# Patient Record
Sex: Female | Born: 1960 | Race: Black or African American | Hispanic: No | Marital: Single | Smoking: Current every day smoker
Health system: Southern US, Community
[De-identification: ages and names within clinical notes are randomized; demographics above are authoritative.]

## PROBLEM LIST (undated history)

## (undated) DIAGNOSIS — I499 Cardiac arrhythmia, unspecified: Secondary | ICD-10-CM

## (undated) DIAGNOSIS — K819 Cholecystitis, unspecified: Secondary | ICD-10-CM

## (undated) DIAGNOSIS — I4891 Unspecified atrial fibrillation: Secondary | ICD-10-CM

## (undated) DIAGNOSIS — Z5189 Encounter for other specified aftercare: Secondary | ICD-10-CM

## (undated) DIAGNOSIS — I10 Essential (primary) hypertension: Secondary | ICD-10-CM

## (undated) DIAGNOSIS — M1990 Unspecified osteoarthritis, unspecified site: Secondary | ICD-10-CM

## (undated) HISTORY — PX: ANKLE SURGERY: SHX546

## (undated) HISTORY — PX: OTHER SURGICAL HISTORY: SHX169

## (undated) HISTORY — PX: BACK SURGERY: SHX140
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
## 1998-03-08 ENCOUNTER — Encounter: Admission: RE | Admit: 1998-03-08 | Discharge: 1998-06-06 | Payer: Self-pay | Admitting: Orthopedic Surgery

## 1998-07-24 ENCOUNTER — Emergency Department (HOSPITAL_COMMUNITY): Admission: EM | Admit: 1998-07-24 | Discharge: 1998-07-25 | Payer: Self-pay | Admitting: Emergency Medicine

## 1999-01-17 ENCOUNTER — Emergency Department (HOSPITAL_COMMUNITY): Admission: EM | Admit: 1999-01-17 | Discharge: 1999-01-17 | Payer: Self-pay | Admitting: Emergency Medicine

## 1999-04-08 ENCOUNTER — Emergency Department (HOSPITAL_COMMUNITY): Admission: EM | Admit: 1999-04-08 | Discharge: 1999-04-08 | Payer: Self-pay | Admitting: *Deleted

## 1999-09-24 ENCOUNTER — Emergency Department (HOSPITAL_COMMUNITY): Admission: EM | Admit: 1999-09-24 | Discharge: 1999-09-24 | Payer: Self-pay | Admitting: Emergency Medicine

## 1999-09-24 ENCOUNTER — Encounter: Payer: Self-pay | Admitting: Emergency Medicine

## 2002-01-09 ENCOUNTER — Inpatient Hospital Stay (HOSPITAL_COMMUNITY): Admission: EM | Admit: 2002-01-09 | Discharge: 2002-01-11 | Payer: Self-pay | Admitting: Emergency Medicine

## 2002-01-09 ENCOUNTER — Encounter: Payer: Self-pay | Admitting: Emergency Medicine

## 2004-11-04 ENCOUNTER — Inpatient Hospital Stay (HOSPITAL_COMMUNITY): Admission: EM | Admit: 2004-11-04 | Discharge: 2004-11-07 | Payer: Self-pay | Admitting: Emergency Medicine

## 2006-10-03 ENCOUNTER — Emergency Department (HOSPITAL_COMMUNITY): Admission: EM | Admit: 2006-10-03 | Discharge: 2006-10-04 | Payer: Self-pay | Admitting: Emergency Medicine

## 2007-09-15 ENCOUNTER — Inpatient Hospital Stay (HOSPITAL_COMMUNITY): Admission: AD | Admit: 2007-09-15 | Discharge: 2007-09-16 | Payer: Self-pay | Admitting: Obstetrics and Gynecology

## 2008-03-24 ENCOUNTER — Inpatient Hospital Stay (HOSPITAL_COMMUNITY): Admission: AD | Admit: 2008-03-24 | Discharge: 2008-03-25 | Payer: Self-pay | Admitting: Obstetrics & Gynecology

## 2008-03-26 ENCOUNTER — Inpatient Hospital Stay (HOSPITAL_COMMUNITY): Admission: RE | Admit: 2008-03-26 | Discharge: 2008-03-26 | Payer: Self-pay | Admitting: Obstetrics & Gynecology

## 2008-06-16 ENCOUNTER — Observation Stay (HOSPITAL_COMMUNITY): Admission: AD | Admit: 2008-06-16 | Discharge: 2008-06-17 | Payer: Self-pay | Admitting: Obstetrics & Gynecology

## 2008-06-16 ENCOUNTER — Ambulatory Visit: Payer: Self-pay | Admitting: Obstetrics & Gynecology

## 2008-07-02 ENCOUNTER — Encounter: Payer: Self-pay | Admitting: Obstetrics & Gynecology

## 2008-07-02 ENCOUNTER — Ambulatory Visit: Payer: Self-pay | Admitting: Obstetrics & Gynecology

## 2008-08-31 ENCOUNTER — Inpatient Hospital Stay (HOSPITAL_COMMUNITY): Admission: AD | Admit: 2008-08-31 | Discharge: 2008-08-31 | Payer: Self-pay | Admitting: Obstetrics & Gynecology

## 2008-10-06 ENCOUNTER — Inpatient Hospital Stay (HOSPITAL_COMMUNITY): Admission: AD | Admit: 2008-10-06 | Discharge: 2008-10-06 | Payer: Self-pay | Admitting: Obstetrics & Gynecology

## 2008-10-22 ENCOUNTER — Ambulatory Visit: Payer: Self-pay | Admitting: Obstetrics and Gynecology

## 2008-10-22 ENCOUNTER — Other Ambulatory Visit: Admission: RE | Admit: 2008-10-22 | Discharge: 2008-10-22 | Payer: Self-pay | Admitting: Obstetrics and Gynecology

## 2008-10-22 LAB — CONVERTED CEMR LAB
HCT: 33.9 % — ABNORMAL LOW (ref 36.0–46.0)
Hemoglobin: 9.9 g/dL — ABNORMAL LOW (ref 12.0–15.0)
MCV: 65.4 fL — ABNORMAL LOW (ref 78.0–100.0)
RBC: 5.18 M/uL — ABNORMAL HIGH (ref 3.87–5.11)
WBC: 8.3 10*3/uL (ref 4.0–10.5)

## 2008-11-18 ENCOUNTER — Ambulatory Visit: Payer: Self-pay | Admitting: Family Medicine

## 2008-11-18 ENCOUNTER — Ambulatory Visit: Payer: Self-pay | Admitting: Obstetrics and Gynecology

## 2008-12-13 ENCOUNTER — Ambulatory Visit: Payer: Self-pay | Admitting: Obstetrics & Gynecology

## 2008-12-13 ENCOUNTER — Ambulatory Visit (HOSPITAL_COMMUNITY): Admission: RE | Admit: 2008-12-13 | Discharge: 2008-12-14 | Payer: Self-pay | Admitting: Obstetrics & Gynecology

## 2009-12-06 ENCOUNTER — Emergency Department (HOSPITAL_COMMUNITY): Admission: EM | Admit: 2009-12-06 | Discharge: 2009-12-06 | Payer: Self-pay | Admitting: Emergency Medicine

## 2010-01-06 IMAGING — US US TRANSVAGINAL NON-OB
1 series · 13 of 25 positions shown · non-contrast
Comparison: 03/26/2008

CLINICAL DATA: Lower abdominal and pelvic pain.  Complex bilateral
ovarian cysts.  Fibroids.

TRANSVAGINAL ULTRASOUND OF PELVIS
TECHNIQUE: Transvaginal ultrasound examination of the pelvis was
performed including evaluation of the uterus, ovaries, adnexal
regions, and pelvic cul-de-sac.

[Series 1: us transvaginal non-ob · 0.14mm/px · 13 of 41 slices shown]
[im 1/41]
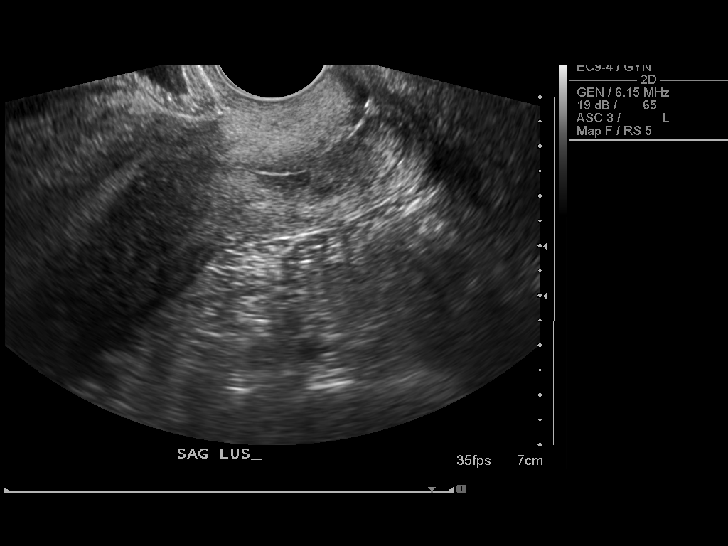
[im 4/41]
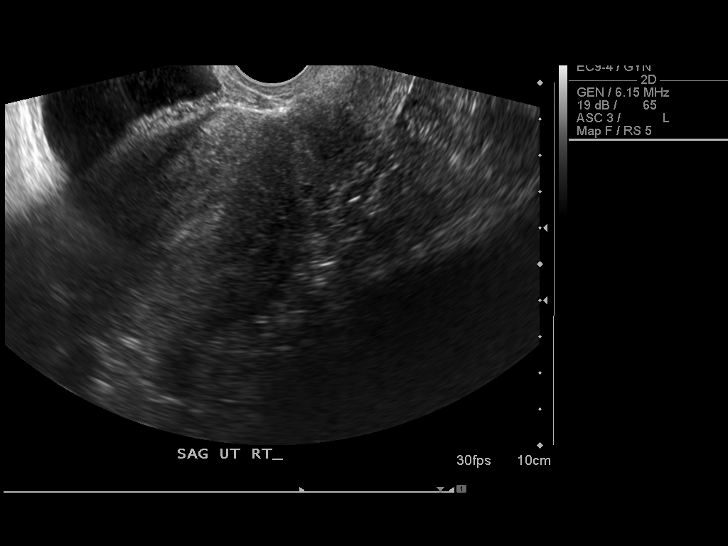
[im 7/41]
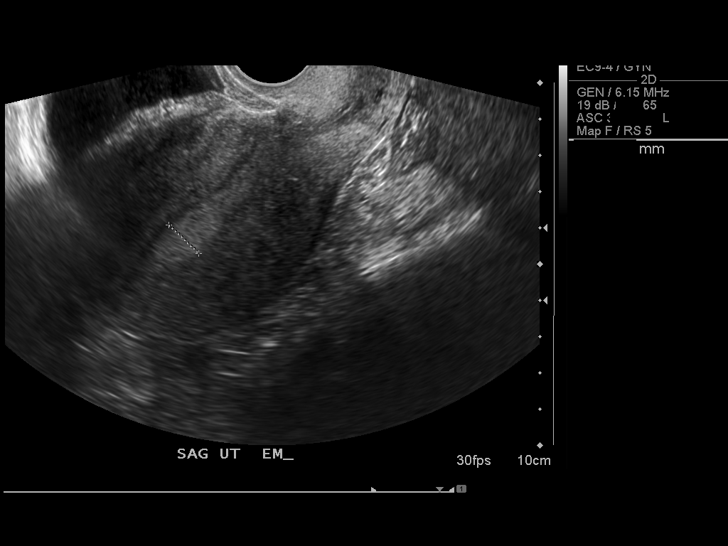
[im 11/41]
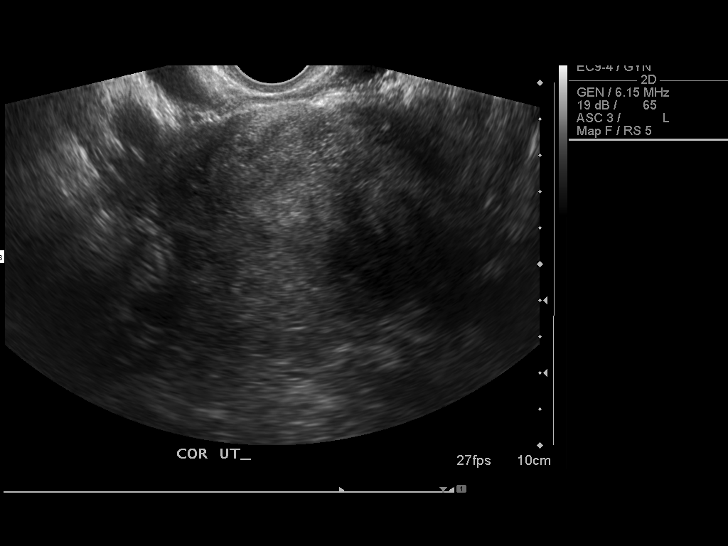
[im 14/41]
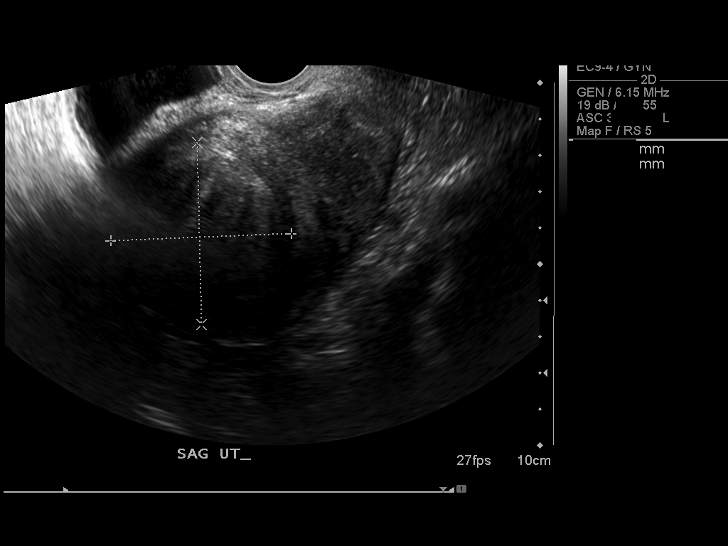
[im 17/41]
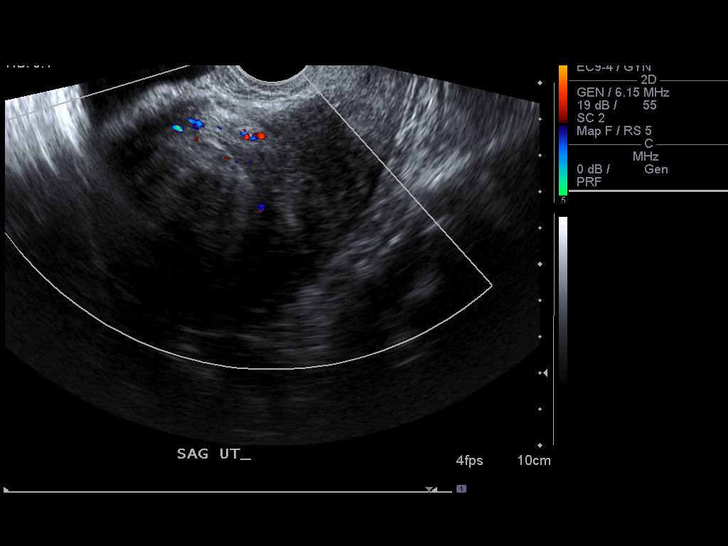
[im 21/41]
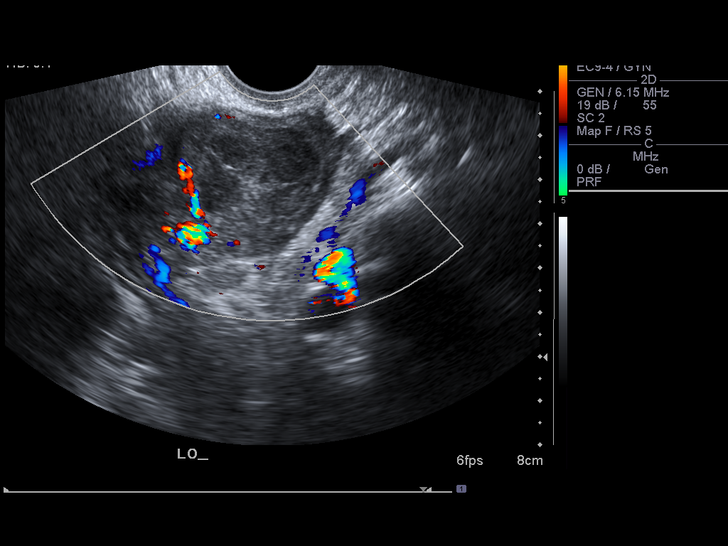
[im 24/41]
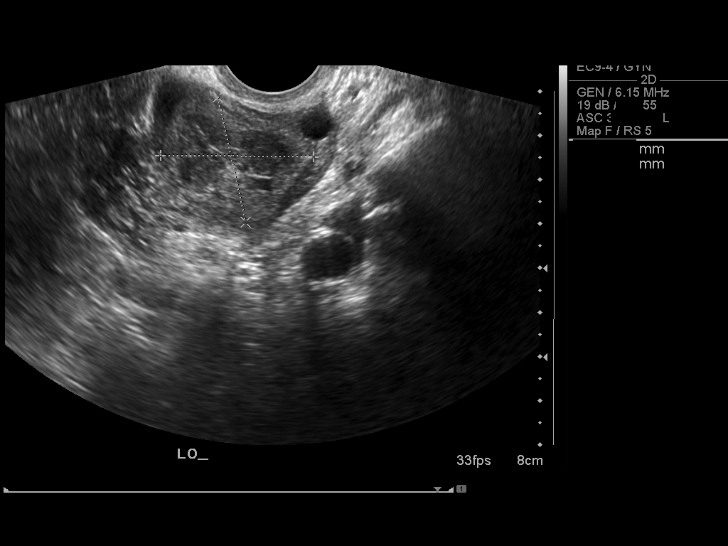
[im 27/41]
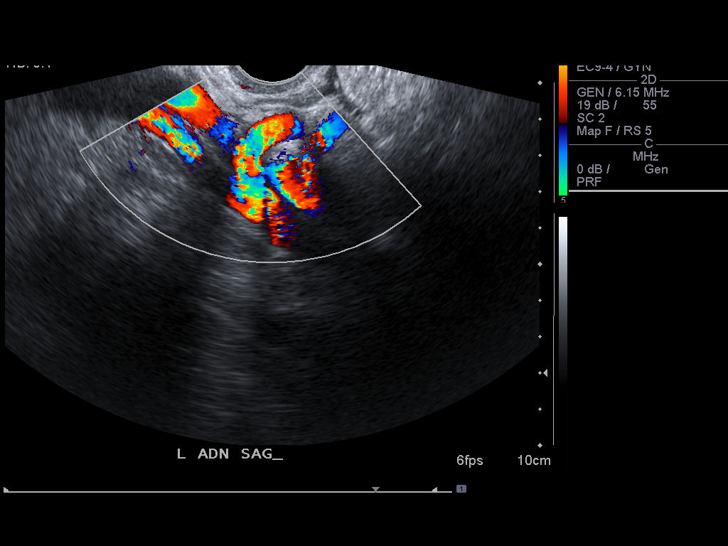
[im 31/41]
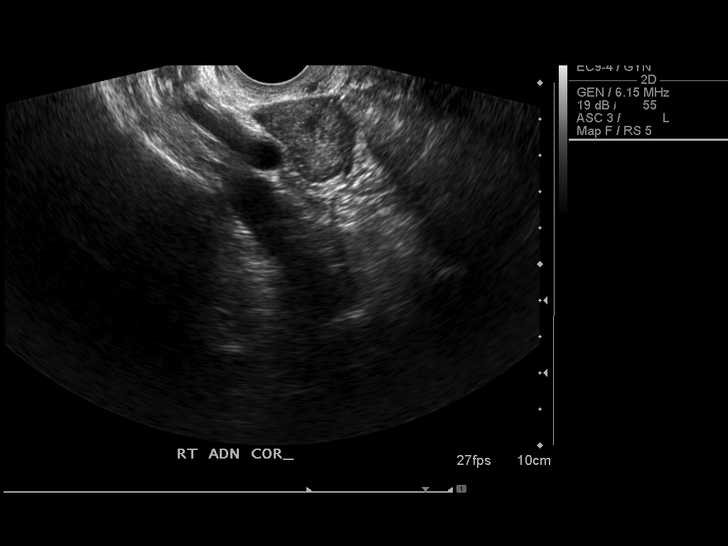
[im 34/41]
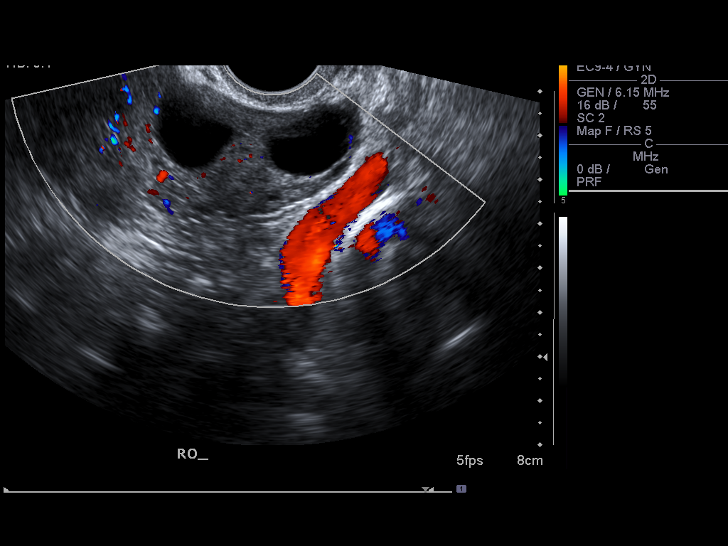
[im 37/41]
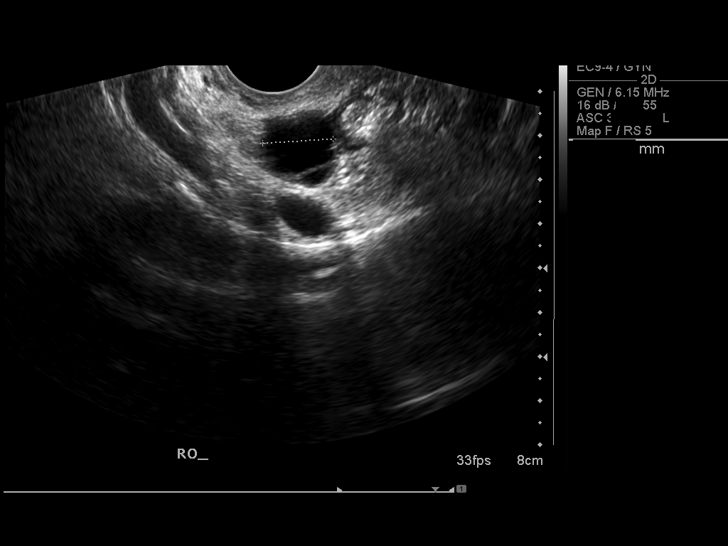
[im 41/41]
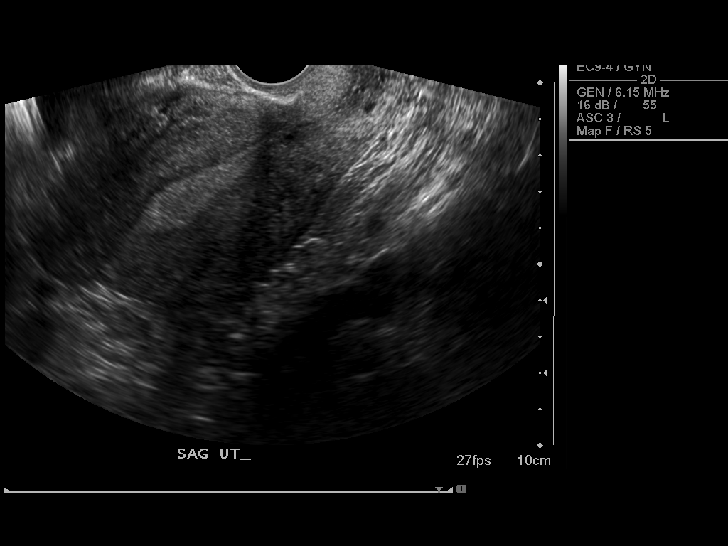

[13 of 25 positions shown; findings below may reference images not displayed]

FINDINGS: The uterus measures approximately 10.7 x 5.3 x 7.6 cm.
An intramural fibroid with a partial submucosal component is again
seen in the left uterine body which measures 5.0 cm in maximum
diameter.  This is stable since prior study.  Endometrial thickness
measures 11 mm on transvaginal sonography and is unremarkable in
appearance.

The previously seen complex left ovarian cyst is smaller in size,
now measuring 3.4 x 2.9 by 2.8 cm compared with 4.6 x 3.4 x 3.5 cm
on prior study.  This is consistent with a resolving hemorrhagic
cyst.  The right ovary is normal in appearance on today's study,
and previously seen complex right ovarian cyst is no longer
visualized.  There is no evidence of free fluid.
IMPRESSION: 1.  Stable 5 cm fibroid in the left uterine body, with partial
submucosal component.
2.  Normal right ovary, with resolution of previously seen
hemorrhagic cyst.
3.  Decreased size of left ovarian complex cyst, also consistent
with a resolving hemorrhagic cyst.

## 2010-09-17 ENCOUNTER — Encounter: Payer: Self-pay | Admitting: *Deleted

## 2010-09-18 ENCOUNTER — Encounter: Payer: Self-pay | Admitting: *Deleted

## 2010-10-01 ENCOUNTER — Emergency Department (HOSPITAL_COMMUNITY): Payer: Medicaid Other

## 2010-10-01 ENCOUNTER — Inpatient Hospital Stay (HOSPITAL_COMMUNITY)
Admission: EM | Admit: 2010-10-01 | Discharge: 2010-10-05 | DRG: 552 | Disposition: A | Discharge status: Home / Self Care | Payer: Medicaid Other | Attending: Internal Medicine | Admitting: Internal Medicine

## 2010-10-01 ENCOUNTER — Encounter (HOSPITAL_COMMUNITY): Payer: Self-pay | Admitting: Radiology

## 2010-10-01 ENCOUNTER — Inpatient Hospital Stay (HOSPITAL_COMMUNITY): Payer: Medicaid Other

## 2010-10-01 DIAGNOSIS — E669 Obesity, unspecified: Secondary | ICD-10-CM | POA: Diagnosis present

## 2010-10-01 DIAGNOSIS — M48061 Spinal stenosis, lumbar region without neurogenic claudication: Principal | ICD-10-CM | POA: Diagnosis present

## 2010-10-01 DIAGNOSIS — R29898 Other symptoms and signs involving the musculoskeletal system: Secondary | ICD-10-CM | POA: Diagnosis present

## 2010-10-01 DIAGNOSIS — D649 Anemia, unspecified: Secondary | ICD-10-CM | POA: Diagnosis present

## 2010-10-01 DIAGNOSIS — D259 Leiomyoma of uterus, unspecified: Secondary | ICD-10-CM | POA: Diagnosis present

## 2010-10-01 DIAGNOSIS — M169 Osteoarthritis of hip, unspecified: Secondary | ICD-10-CM | POA: Diagnosis present

## 2010-10-01 DIAGNOSIS — F17200 Nicotine dependence, unspecified, uncomplicated: Secondary | ICD-10-CM | POA: Diagnosis present

## 2010-10-01 DIAGNOSIS — G8190 Hemiplegia, unspecified affecting unspecified side: Secondary | ICD-10-CM | POA: Diagnosis present

## 2010-10-01 DIAGNOSIS — M1610 Unilateral primary osteoarthritis, unspecified hip: Secondary | ICD-10-CM | POA: Diagnosis present

## 2010-10-01 DIAGNOSIS — M79609 Pain in unspecified limb: Secondary | ICD-10-CM | POA: Diagnosis present

## 2010-10-01 DIAGNOSIS — M129 Arthropathy, unspecified: Secondary | ICD-10-CM | POA: Diagnosis present

## 2010-10-01 DIAGNOSIS — N83209 Unspecified ovarian cyst, unspecified side: Secondary | ICD-10-CM | POA: Diagnosis present

## 2010-10-01 DIAGNOSIS — R269 Unspecified abnormalities of gait and mobility: Secondary | ICD-10-CM | POA: Diagnosis present

## 2010-10-01 DIAGNOSIS — G8929 Other chronic pain: Secondary | ICD-10-CM | POA: Diagnosis present

## 2010-10-01 DIAGNOSIS — I10 Essential (primary) hypertension: Secondary | ICD-10-CM | POA: Diagnosis present

## 2010-10-01 LAB — DIFFERENTIAL
Eosinophils Relative: 1 % (ref 0–5)
Lymphocytes Relative: 31 % (ref 12–46)
Lymphs Abs: 3.3 10*3/uL (ref 0.7–4.0)
Monocytes Relative: 13 % — ABNORMAL HIGH (ref 3–12)
Neutrophils Relative %: 54 % (ref 43–77)

## 2010-10-01 LAB — URINALYSIS, ROUTINE W REFLEX MICROSCOPIC
Ketones, ur: NEGATIVE mg/dL
Urine Glucose, Fasting: NEGATIVE mg/dL
pH: 6 (ref 5.0–8.0)

## 2010-10-01 LAB — POCT I-STAT, CHEM 8
BUN: <3 mg/dL — ABNORMAL LOW (ref 6–23)
Chloride: 103 mEq/L (ref 96–112)
Creatinine, Ser: 0.9 mg/dL (ref 0.4–1.2)
Sodium: 136 mEq/L (ref 135–145)
TCO2: 24 mmol/L (ref 0–100)

## 2010-10-01 LAB — COMPREHENSIVE METABOLIC PANEL
ALT: 22 U/L (ref 0–35)
AST: 30 U/L (ref 0–37)
Albumin: 3.7 g/dL (ref 3.5–5.2)
Calcium: 9.6 mg/dL (ref 8.4–10.5)
GFR calc Af Amer: >60 mL/min (ref >60)
Glucose, Bld: 75 mg/dL (ref 70–99)
Potassium: 3.5 mEq/L (ref 3.5–5.1)
Sodium: 137 mEq/L (ref 135–145)
Total Protein: 8.1 g/dL (ref 6.0–8.3)

## 2010-10-01 LAB — CBC
HCT: 38.6 % (ref 36.0–46.0)
Hemoglobin: 12.6 g/dL (ref 12.0–15.0)
MCH: 22.5 pg — ABNORMAL LOW (ref 26.0–34.0)
MCHC: 32.6 g/dL (ref 30.0–36.0)
MCV: 68.9 fL — ABNORMAL LOW (ref 78.0–100.0)
RBC: 5.6 MIL/uL — ABNORMAL HIGH (ref 3.87–5.11)

## 2010-10-02 LAB — CBC
Hemoglobin: 12.4 g/dL (ref 12.0–15.0)
MCH: 22.2 pg — ABNORMAL LOW (ref 26.0–34.0)
MCV: 69.4 fL — ABNORMAL LOW (ref 78.0–100.0)
Platelets: 293 10*3/uL (ref 150–400)
RBC: 5.59 MIL/uL — ABNORMAL HIGH (ref 3.87–5.11)
WBC: 7.3 10*3/uL (ref 4.0–10.5)

## 2010-10-02 LAB — BASIC METABOLIC PANEL
BUN: 4 mg/dL — ABNORMAL LOW (ref 6–23)
CO2: 24 mEq/L (ref 19–32)
Chloride: 97 mEq/L (ref 96–112)
Creatinine, Ser: 0.63 mg/dL (ref 0.4–1.2)
GFR calc Af Amer: >60 mL/min (ref >60)
Potassium: 4.1 mEq/L (ref 3.5–5.1)

## 2010-10-02 LAB — SEDIMENTATION RATE: Sed Rate: 13 mm/hr (ref 0–22)

## 2010-10-03 LAB — CBC
HCT: 37.5 % (ref 36.0–46.0)
Hemoglobin: 12 g/dL (ref 12.0–15.0)
MCV: 70.2 fL — ABNORMAL LOW (ref 78.0–100.0)
RDW: 18.9 % — ABNORMAL HIGH (ref 11.5–15.5)
WBC: 17 10*3/uL — ABNORMAL HIGH (ref 4.0–10.5)

## 2010-10-03 LAB — BASIC METABOLIC PANEL
BUN: 9 mg/dL (ref 6–23)
CO2: 27 mEq/L (ref 19–32)
Glucose, Bld: 138 mg/dL — ABNORMAL HIGH (ref 70–99)
Potassium: 4.3 mEq/L (ref 3.5–5.1)
Sodium: 136 mEq/L (ref 135–145)

## 2010-10-04 LAB — CBC
HCT: 38.5 % (ref 36.0–46.0)
Hemoglobin: 12.1 g/dL (ref 12.0–15.0)
MCH: 22.3 pg — ABNORMAL LOW (ref 26.0–34.0)
MCHC: 31.4 g/dL (ref 30.0–36.0)
MCV: 70.9 fL — ABNORMAL LOW (ref 78.0–100.0)
RDW: 18.8 % — ABNORMAL HIGH (ref 11.5–15.5)

## 2010-10-04 LAB — BASIC METABOLIC PANEL
BUN: 11 mg/dL (ref 6–23)
CO2: 29 mEq/L (ref 19–32)
Calcium: 9.4 mg/dL (ref 8.4–10.5)
Creatinine, Ser: 0.7 mg/dL (ref 0.4–1.2)
GFR calc non Af Amer: >60 mL/min (ref >60)
Glucose, Bld: 109 mg/dL — ABNORMAL HIGH (ref 70–99)

## 2010-10-12 NOTE — Discharge Summary (Signed)
Marissa Weber, Marissa Weber                 ACCOUNT NO.:  [id number]  MEDICAL RECORD NO.:  [id number]           PATIENT TYPE:  I  LOCATION:  1309                         FACILITY:  [facility]  PHYSICIAN:  Altha Harm, MDDATE OF BIRTH:  April 21, 1961  DATE OF ADMISSION:  10/01/2010 DATE OF DISCHARGE:  10/05/2010                              DISCHARGE SUMMARY   DISCHARGE DISPOSITION:  Home.  FINAL DISCHARGE DIAGNOSES: 1. Acute right lower extremity pain and weakness. 2. Spinal stenosis. 3. Arthritis. 4. Gait abnormality. 5. Chronic pain. 6. Hypertension. 7. Complex ovarian cyst and uterine fibroids. 8. Anemia.  DISCHARGE MEDICATIONS:  Include the following: 1. Tylenol 650 mg p.o. every 4 hours p.r.n. pain. 2. Norvasc 5 mg p.o. daily. 3. Flexeril 10 mg p.o. t.i.d. 4. Gabapentin 300 mg p.o. t.i.d. 5. Methylprednisolone as follows:  60 mg p.o. daily x1, then 12 mg     p.o. daily x1, then 8 mg p.o. daily x1, then 4 mg p.o. daily x1, then stop. 6. Oxycodone SR 10 mg p.o. b.i.d. 7. Oxycodone IR 5 mg p.o. every 4 hours p.r.n. 8. Nicotine patch transdermally daily.  MEDICATIONS DISCONTINUED:  BC powders b.i.d.  CONSULTANTS: 1. Reinaldo Meeker, M.D., Neurosurgery. 2. Phone consultation with Leonides Grills, M.D., Orthopedic Surgery.  PROCEDURES:  None.  DIAGNOSTIC STUDIES: 1. CT of the head without contrast which shows moderately advanced     premature atrophy for the patient's age of 57.  Question of slowly     developing arachnoid cyst in the right lateral ventricle pons,     increased conspicuity since prior imaging studies of 2006.  No     acute intracranial findings. 2. X-ray of the right hip, which shows progressive DJD of the right     hip. 3. MRI of the brain without contrast, which shows normal examination.     No abnormality of the brain seen.  I do not believe there is a     posterior fossa arachnoid cyst. 4. MRI of the lumbar spine, which shows marked constrictions of  thecal     sac at L3-L4 and L4-L5 because of protruding disk material,     posterior ligamentous hypertrophy, and abundant epidural fat.  The     patient could suffer from symptoms of spinal stenosis because of     this.  Additionally at L3-L4, there is asymmetric protrusion of the     disk material into the right foraminal region which could focally     compress the right L3 nerve root and explain the right leg     symptoms.  At L4-L5, disk protrusion is more pronounced towards the     left and there will be particular potential of left-sided neural     compression at this level. 5. Complex ovarian cyst on the left with a diameter of 4.2 cm.     Previous ultrasound disclosed the complex cyst of the left ovary,     which measured 3.4 x 2.8 cm most recently.  Ultrasound is suggested     to reevaluate this abnormality. 6. CT of the right hip without contrast,  which shows severe     osteoarthritis of the right hip with prominent degenerative     subcortical cyst formation in the right femoral head, right     acetabulum, and prominent spurring.  There is right hip effusion.  PRIMARY CARE PHYSICIAN:  Unassigned at this time.  GYNECOLOGIST:  Dr. Argentina Donovan  CODE STATUS:  Full code.  ALLERGIES:  No known drug allergies.  CHIEF COMPLAINT:  Difficulty moving leg.  HISTORY OF PRESENT ILLNESS:  Please refer to the H and P by Dr. Pearson Grippe for the details of the HPI.  However, in short, this is a 50 year old female who presented to the emergency room with complaints of difficulty moving her right lower extremity.  HOSPITAL COURSE: 1. Right lower extremity weakness, hemiparesis.  The patient was     evaluated for the right lower extremity with both the CT of the     head and MRI of the head which revealed no evidence of CVA.  The     patient also had a lumbar MRI which showed the findings as noted     above.  Dr. Gerlene Fee from neurosurgery was consulted to see the     patient, and his  recommendations were for physical therapy and the     followup as an outpatient.  Dr. Gerlene Fee felt that the definitive     course of treatments will depend on how the patient progresses with     physical therapy.  The patient was seen by physical therapy here in     the hospital and is being recommended for outpatient physical     therapy to continue.  She is to follow up with Dr. Gerlene Fee in the     office as an outpatient.  The number has been provided to the     patient, [phone].  Additionally, the patient was found to have     degenerative osteoarthritis of the left hip.  Dr. Lestine Box was     consulted.  He had a phone consultation with Dr. Rito Ehrlich and he     recommends following up as an outpatient with Dr. Charlann Boxer who saw the     patient in the past.  Presently, the patient is ambulating with a     walker independently. 2. Complex ovarian cyst.  The patient was noted incidentally to have a     complex ovarian cyst on the left.  In reviewing the patient's prior     radiologic studies, she has had a complex ovarian cyst on left side     which appears to now have gotten larger.  The patient was seen by     Dr. Argentina Donovan in the GYN outpatient clinic and a GYN appointment     will be made for the patient to follow up with Dr. Okey Dupre to further     investigate this finding. 3. Hypertension.  The patient was found to be hypertensive but not on     any medications.  She has been started on Norvasc 5 mg and will     need to be followed up for further titration of medications as an     outpatient. 4. Acute pain.  The patient experienced an acute pain related to her     spinal stenosis as well as the osteoarthritis.  The patient has     been placed on Flexeril for the muscle pain.  Additionally, the     patient is on a Medrol Dosepak  which she is completing over the     next 4 days.  The patient has also been placed on long-acting     OxyContin as well as OxyIR for breakthrough pain. 5.  Gait abnormality.  Secondary to the pain, the patient has been     having problems with ambulation and has been using a cane in the     past.  The patient has now been downgraded to a walker, and she is     to continue using that.  The patient will need outpatient therapy     or in-home therapy per recommendations of physical therapy.  PHYSICAL EXAMINATION:  At the time of discharge, the patient's physical examination is as such. GENERAL:  The patient is well appearing, in no acute distress. VITAL SIGNS:  Temperature is 97.9, heart rate 78, blood pressure 157/90, respiratory rate 18, O2 saturations are 97% on room air. HEENT:  She is normocephalic, atraumatic.  Pupils equally round and reactive to light and accommodation.  Extraocular movements are intact. The oropharynx is moist.  No exudate, erythema, or lesions are noted. NECK: Trachea is midline.  No masses, no thyromegaly, no JVD, no carotid bruit. RESPIRATORY:  The patient has a normal respiratory effort, equal excursion bilaterally.  No wheezing or rhonchi noted. CARDIOVASCULAR:  She has got a normal S1 and S2.  No murmurs, rubs or gallops noted.  PMI is nondisplaced.  No heaves or thrills on palpation. ABDOMEN:  Obese, soft, nontender, nondistended.  No masses.  No hepatosplenomegaly is noted. EXTREMITIES:  No clubbing, cyanosis or edema. NEUROLOGICAL:  The patient has no focal neurological deficits. PSYCHIATRIC:  She is alert and oriented x3.  Good insight and cognition. Good recent and remote recall.  DIETARY RESTRICTIONS:  The patient should be on a heart-healthy diet.  PHYSICAL RESTRICTIONS:  The patient is to ambulate with a walker continue with physical therapy at home.  FOLLOWUP:  The patient will follow up with the GYN clinic for the ovarian cyst.  She is to follow up with Dr. Gerlene Fee.  An appointment to be arranged in Dr. Charlann Boxer, she should call for the appointment.  I will speak with the case manager about referring  the patient to [facility] for followup of her hypertension and her pain issues.  Total time for this discharge 35 minutes.     Altha Harm, MD     MAM/MEDQ  D:  10/05/2010  T:  10/05/2010  Job:  [id number]  cc:   Reinaldo Meeker, M.D. Fax: [phone]  Leonides Grills, M.D. Fax: [phone]  Madlyn Frankel. Charlann Boxer, M.D. Fax: [phone]  Dr. Argentina Donovan  Electronically Signed by Marthann Schiller MD on 10/12/2010 12:59:43 PM

## 2010-10-26 ENCOUNTER — Encounter: Payer: Self-pay | Admitting: Obstetrics and Gynecology

## 2010-11-14 NOTE — H&P (Signed)
Marissa Weber, Marissa Weber                 ACCOUNT NO.:  [id number]  MEDICAL RECORD NO.:  [id number]           PATIENT TYPE:  I  LOCATION:  1309                         FACILITY:  [facility]  PHYSICIAN:  Massie Maroon, MD        DATE OF BIRTH:  [birth year]  DATE OF ADMISSION:  10/01/2010 DATE OF DISCHARGE:                             HISTORY & PHYSICAL   CHIEF COMPLAINT:  "I am having difficulty moving my leg."  HISTORY OF PRESENT ILLNESS:  50 year old female with history of anemia, uterine fibroids, ovarian cysts, presents with complaints of difficulty moving her right lower extremity. Actually symptoms have been going on starting today.  She has had problems with back pain in the past but has difficulty moving her right lower extremity what prompted her to come to the ED today.  The patient states that there is pain extending down from her lower back down her right leg towards her foot.  She cannot recall any trauma or heavy lifting.  The patient will be evaluated for intractable pain and further evaluation with MRI.  The patient denied any focal neurological signs other than weakness in her right leg.  She denied any slurred speech, recent viral illness.  The patient will be admitted for the above diagnosis.  PAST MEDICAL HISTORY: 1. Anemia. 2. Fibroid uterus. 3. Ovarian cyst.  PAST SURGICAL HISTORY: 1. Four C sections and tubal ligation. 2. Orthopedic procedure for screws in the left ankle. 3. Endometrial ablation.  SOCIAL HISTORY:  The patient is a smoker and occasionally drinks alcohol but denies any drug use.  FAMILY HISTORY:  Notable that her mother died of complications of diabetes.  ALLERGIES:  No known drug allergies.  MEDICATIONS:  See med rec.  REVIEW OF SYSTEMS:  Negative for all 10 organ systems except for pertinent positives stated above.  PHYSICAL EXAM:  VITAL SIGNS:  Stable, see physician's  ER note for original signs. HEENT:  Anicteric, EOMI, no nystagmus,  pupils 1.5 mm, symmetric, direct consensual near reflexes intact.  Mucous membranes moist. NECK:  No JVD, no bruit, no thyromegaly, no adenopathy. HEART:  Regular rate and rhythm.  S1, S2, no murmurs, gallops, or rubs. LUNGS:  Clear to auscultation bilaterally. ABDOMEN:  Soft, nontender, nondistended.  Positive bowel sounds. EXTREMITIES:  No cyanosis, clubbing, or edema. SKIN:  No rashes.  Lymph nodes, no adenopathy. NEURO  EXAM:  There is some right lower extremity weakness, although she is able to move her right foot and right leg.  It appears to be painful. There is a positive straight leg raise.  DP pulses are 2+ bilaterally, pinprick intact. Reflexes 2+, symmetric, diffuse with downgoing toes bilaterally.  IMAGING STUDIES:  CT brain moderately advanced premature atrophy for the patient's age of 52, question slowly developing arachnoid cyst, right lateral ventral pons increasing in conspicuity since prior imaging 2006.  Hip x-ray:  Progressive DJD right hip.  LABS:  Urinalysis negative.  WBC 10.8, hemoglobin 12.6, platelet count278.  Sodium 136, potassium 3.7, BUN less than 3, creatinine 0.9, PT 13.5, INR 1.01,  PTT 38.  ESR 22.  ASSESSMENT/PLAN:  Intractable back pain with right lower extremity weakness:  Check an MRI brain without contrast along with an MRI of the lumbosacral spine.  Check an ESR as stated above, if high then consider an SPEP and EPEP.  Will try to achieve pain control as well and may need to involve physical therapy, occupational therapy once imaging is performed or orthopedic or  neurosurgery depending on the results of the MRI.  DVT prophylaxis.  SCDs.     Massie Maroon, MD     JYK/MEDQ  D:  10/04/2010  T:  10/04/2010  Job:  [id number]  Electronically Signed by Pearson Grippe MD on 11/14/2010 09:32:15 PM

## 2010-11-29 ENCOUNTER — Encounter: Payer: Self-pay | Admitting: Obstetrics and Gynecology

## 2010-12-06 LAB — CBC
Hemoglobin: 11.5 g/dL — ABNORMAL LOW (ref 12.0–15.0)
MCHC: 30.9 g/dL (ref 30.0–36.0)
MCV: 63.5 fL — ABNORMAL LOW (ref 78.0–100.0)
Platelets: 231 10*3/uL (ref 150–400)
Platelets: 318 10*3/uL (ref 150–400)
RDW: 22.6 % — ABNORMAL HIGH (ref 11.5–15.5)
WBC: 8.6 10*3/uL (ref 4.0–10.5)

## 2010-12-11 LAB — CBC
HCT: 37.1 % (ref 36.0–46.0)
Platelets: 272 10*3/uL (ref 150–400)
RBC: 4.93 MIL/uL (ref 3.87–5.11)
WBC: 10.5 10*3/uL (ref 4.0–10.5)

## 2010-12-11 LAB — SAMPLE TO BLOOD BANK

## 2010-12-12 LAB — WET PREP, GENITAL
Trich, Wet Prep: NONE SEEN
Yeast Wet Prep HPF POC: NONE SEEN

## 2010-12-12 LAB — GC/CHLAMYDIA PROBE AMP, GENITAL
Chlamydia, DNA Probe: NEGATIVE
GC Probe Amp, Genital: NEGATIVE

## 2010-12-12 LAB — DIC (DISSEMINATED INTRAVASCULAR COAGULATION)PANEL
D-Dimer, Quant: 0.32 ug/mL-FEU (ref 0.00–0.48)
Fibrinogen: 343 mg/dL (ref 204–475)
Prothrombin Time: 13.5 seconds (ref 11.6–15.2)
aPTT: 33 seconds (ref 24–37)

## 2010-12-12 LAB — CBC
Hemoglobin: 10.5 g/dL — ABNORMAL LOW (ref 12.0–15.0)
MCV: 69.4 fL — ABNORMAL LOW (ref 78.0–100.0)
RBC: 4.95 MIL/uL (ref 3.87–5.11)
WBC: 9 10*3/uL (ref 4.0–10.5)

## 2010-12-12 LAB — DIC (DISSEMINATED INTRAVASCULAR COAGULATION) PANEL
INR: 1 (ref 0.00–1.49)
Platelets: 400 10*3/uL (ref 150–400)
Smear Review: NONE SEEN

## 2010-12-27 ENCOUNTER — Other Ambulatory Visit: Payer: Self-pay | Admitting: Neurosurgery

## 2010-12-27 DIAGNOSIS — M4800 Spinal stenosis, site unspecified: Secondary | ICD-10-CM

## 2011-01-01 ENCOUNTER — Ambulatory Visit
Admission: RE | Admit: 2011-01-01 | Discharge: 2011-01-01 | Disposition: A | Discharge status: Home / Self Care | Payer: Medicaid Other | Source: Ambulatory Visit | Attending: Neurosurgery | Admitting: Neurosurgery

## 2011-01-01 DIAGNOSIS — M4800 Spinal stenosis, site unspecified: Secondary | ICD-10-CM
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
## 2011-01-09 NOTE — Op Note (Signed)
Marissa Weber, Marissa Weber                 ACCOUNT NO.:  [id number]   MEDICAL RECORD NO.:  [id number]          PATIENT TYPE:  OBV   LOCATION:  9319                          [facility]ILITY[facility]   PHYSICIAN:  Scheryl Darter, MD       DATE OF BIRTH:  [birth year]   DATE OF PROCEDURE:  12/13/2008  DATE OF DISCHARGE:                               OPERATIVE REPORT   PROCEDURE:  Endometrial ablation using ThermaChoice.   PREOPERATIVE DIAGNOSIS:  Menorrhagia with uterine fibroid.   POSTOPERATIVE DIAGNOSIS:  Menorrhagia with uterine fibroid.   SURGEON:  Scheryl Darter, MD   ANESTHESIA:  General.   ESTIMATED BLOOD LOSS:  Minimal.   SPECIMENS:  None.   DRAINS:  None.   COUNTS:  Correct.   OPERATIVE REPORT:  The patient gave written consent for endometrial  ablation.  She had menorrhagia, a benign endometrial biopsy, and an  intramural fibroid on ultrasound.  The patient identification was  confirmed and she was brought to the OR and general anesthesia was  induced.  She was placed in dorsal lithotomy position.  Exam revealed  about a 6-8-week size uterus.  No adnexal masses.  Perineum and vagina  were sterilely prepped and draped.  Bladder was drained with red rubber  catheter.  Speculum was inserted.  The cervix was grasped with single-  tooth tenaculum.  The uterus sounded to 8.5 cm.  Cervix was dilated  enough to pass the preprimed ThermaChoice device.  D5 of water was used  to show the balloon.  The balloon was filled sufficiently to maintain a  pressure in the proper range to initiate therapy.  Some more fluid was  needed to be instilled during the treatment cycle maintaining pressure  above the 110 mmHg.  Appropriate temperature was maintained throughout  the minute cycle.  The fluid was drained from the device and instrument  was removed.  All instruments were removed from the vagina.  The patient  tolerated procedure well without complications.  She was brought in  stable condition to recovery  room.      Scheryl Darter, MD  Electronically Signed     JA/MEDQ  D:  12/13/2008  T:  12/14/2008  Job:  [id number]

## 2011-01-09 NOTE — Discharge Summary (Signed)
Marissa Weber, Marissa Weber                 ACCOUNT NO.:  [id number]   MEDICAL RECORD NO.:  [id number]          PATIENT TYPE:  OBV   LOCATION:  9319                          [facility]   PHYSICIAN:  Scheryl Darter, MD       DATE OF BIRTH:  09-29-1960   DATE OF ADMISSION:  12/13/2008  DATE OF DISCHARGE:  12/14/2008                               DISCHARGE SUMMARY   DIAGNOSES:  1. Menorrhagia.  2. Postoperative pain from endometrial ablation.   The patient is a 50 year old black female gravida 4, para 4, last  menstrual period 2 weeks ago who came for an outpatient endometrial  ablation with ThermaChoice on April 19.  She has had problems with  menorrhagia and previously anemia was transfused.  She underwent  ThermaChoice endometrial ablation without complications, but  postoperatively she had pain that was not well controlled with Dilaudid  and Toradol and she was placed an overnight observation for pain  control.   PAST MEDICAL HISTORY:  1. Anemia.  2. Fibroid uterus.  3. Ovarian cyst.   PAST SURGICAL HISTORY:  1. Four cesarean sections and tubal ligation.  2. Orthopedic procedure with screws in her left ankle.   SOCIAL HISTORY:  The patient is a smoker and occasionally drinks  alcohol, but denies drug use.   No known drug allergies.   MEDICATIONS:  Iron supplement daily, Tylenol p.r.n., and Advil p.r.n.  for joint pain.   PHYSICAL EXAMINATION:  VITAL SIGNS:  Weight 168.2 pounds, height 59  inches, and pulse of 104.  CHEST:  Clear.  HEART:  Regular rate and rhythm.  PELVIC:  Per Dr. Okey Dupre shows 8-10 weeks size uterus.   Ultrasound showed an 11-mm endometrial thickness and a 5-cm fibroid in  the left uterine body.  She had a benign endometrial biopsy.  On the day  after surgery, her pain was well controlled having used Dilaudid PCA.  Hemoglobin dropped from 11.5 to 8.9, but there was no sign of any active  bleeding and her abdominal exam was benign.  She  was discharged home  to continue with her iron supplement.  She was given  prescription for Motrin 600 mg p.o. q.6 h. p.r.n. pain and Vicodin 5/500  1-2 p.o. q.4-6 h. p.r.n. pain.  She is to report if symptoms of pain,  heavy bleeding and/or discharge, or fever reported.  She is to follow up  in 4 weeks at [facility] at [facility].      Scheryl Darter, MD  Electronically Signed     JA/MEDQ  D:  12/14/2008  T:  12/14/2008  Job:  [id number]

## 2011-01-09 NOTE — Group Therapy Note (Signed)
NAMEGLENDON, FISER NO.:  [id number]   MEDICAL RECORD NO.:  [id number]          PATIENT TYPE:  WOC   LOCATION:  [facility]                   FACILITY:  [facility]   PHYSICIAN:  Scheryl Darter, MD       DATE OF BIRTH:  14-Nov-1960   DATE OF SERVICE:  11/18/2008                                  CLINIC NOTE   Patient returns today for evaluation for endometrial ablation.  The  patient is a 50 year old black female gravida 4, para 4-0-0-5, last  menstrual period started October 06, 2008.  She had a history in the  last 3 months of increasing menstrual flow.  In November 2009, she had  heavy vaginal bleeding and was admitted with a hemoglobin around 5 and  she received 4 units of packed red blood cells and she was placed on  iron.  She was found to have a fibroid measuring about 5 cm on  ultrasound which was stable from a previous exam.  The fibroid was  mostly intramural with a small submucosal component.  In February 2010,  hemoglobin was 10 with a hematocrit of 34.   PAST MEDICAL HISTORY:  1. Anemia.  2. Fibroid uterus.  3. Ovarian cyst.   PAST SURGICAL HISTORY:  1. Four cesarean sections with tubal ligation in her last delivery.  2. Orthopedic procedure with screws in her left ankle.   SOCIAL HISTORY:  The patient is an occasional drinker, current smoker  and she denies drug abuse.   No known drug allergies.   MEDICATIONS:  1. Iron supplement 1 p.o. daily.  2. Tylenol p.r.n.  3. Advil p.r.n. for joint pain.   FAMILY HISTORY:  Diabetes and hypertension in her parents.   REVIEW OF SYSTEMS:  No bleeding today, abnormal discharge or urinary  symptoms.   PHYSICAL EXAM:  The patient in no acute distress.  Weight is 168.2  pounds, height is 59 inches, pulse 104, temperature 98.8.  CHEST:  Clear.  HEART:  Regular rate and rhythm without murmurs, gallops or rubs.  ABDOMEN:  Obese.  PELVIC:  On February 26th, external genitalia normal, uterus was about 8-  10  weeks, no adnexal masses.  Dr. Okey Dupre performed an endometrial biopsy  on February 26th which showed benign secretory endometrium,  decidualization of the stroma.  Ultrasound on June 16, 2008, she has  got an 11-mm endometrial thickness and a 5 cm fibroid in the left  uterine body.   IMPRESSION:  1. Menorrhagia and history of anemia.  2. Uterine fibroids.   PLAN:  The patient is interested in having endometrial ablation.  The  procedure was explained.  The option for NovaSure or ThermaChoice was  discussed.  I will schedule a ThermaChoice ablation.  The risks of  continued problems with bleeding, anesthesia complications, infection,  uterine damage were discussed and her questions were answered.      Scheryl Darter, MD     JA/MEDQ  D:  11/18/2008  T:  11/18/2008  Job:  [id number]

## 2011-01-12 NOTE — Discharge Summary (Signed)
Marissa, Weber                 ACCOUNT NO.:  [id number]   MEDICAL RECORD NO.:  [id number]          PATIENT TYPE:  INP   LOCATION:  3019                         FACILITY:  [facility]   PHYSICIAN:  Marlan Palau, M.D.  DATE OF BIRTH:  [birth year]   DATE OF ADMISSION:  11/04/2004  DATE OF DISCHARGE:  11/06/2004                                 DISCHARGE SUMMARY   ADMISSION DIAGNOSIS:  Hemiparesis and hemi sensory deficit, possible left  brain stroke.   DISCHARGE DIAGNOSES:  1.  Probable hysterical right-sided symptoms of numbness, weakness, and      visual change.  2.  Positive urine drug screen for cocaine.   PROCEDURE:  1.  CT of the head.  2.  MRI of the brain.  3.  MR angiogram of intracranial and extracranial vessels.   COMPLICATIONS OF ABOVE PROCEDURES:  None.   HISTORY OF PRESENT ILLNESS:  Marissa Weber is a 50 year old right-handed black  female born [birth year] with a history of tobacco abuse and alcohol  abuse prior to admission.  This patient was brought into [facility]  emergency room for onset of a deficit of right-sided weakness and numbness,  and visual changes off to the right that occurred around 5 to 5:30 p.m.  The  patient got to the emergency room about 6:30 or 6:45 p.m.  CT scan of the  brain was done and was unremarkable.  The patient's examination reveals  evidence of a floppy right side but no involvement of the face, complete  anesthesia of the right face, arm and leg, and a visual field deficit off to  the right.  The patient was admitted for further evaluation.   The patient was given TPA initiated in the emergency room around 7 p.m.   PAST MEDICAL HISTORY:  1.  New onset of right hemiparesis and right hemianesthesia and visual field      deficit, rule out left brain stroke.  2.  Left ankle fracture, status post surgery.  3.  Obesity.  4.  Degenerative arthritis.  5.  History of C-section in the past.   CURRENT MEDICATIONS:  The patient  is currently on no medications prior to  admission.   SOCIAL HISTORY:  She smokes a half pack of cigarettes a day and drinks beer  on a daily basis, one to two 40 ounce beers daily.   ALLERGIES:  THE PATIENT HAS NO KNOWN ALLERGIES.   Please refer to history and physical dictation for social history, family  history, review of systems, and  physical examination.   LABORATORY DATA:  Notable for CK of 133, troponin I less than 0.1, MB  fraction 1.3.  Hemoglobin A1c 6.2.  Alcohol level 264.  Sodium 139,  potassium 3.8, chloride 108, CO2 23, glucose 105, BUN 6, creatinine 0.7,  calcium 8.6, total protein 7.0, albumin 3.3, AST 29, ALT 19, alkaline  phosphatase 69, total bilirubin 0.4.  INR 0.9.  White count 9.2, hemoglobin  9.0, hematocrit 28.8, platelets 574,000.  Urinalysis reveals a specific  gravity of 1.019, pH 5.5.  Urine white  count 0-2, urine red count 0-2.  Urine drug screen positive for cocaine and benzodiazepines.  Homocysteine  level 12.13.  Lipid profile reveals a cholesterol level of 147 with  triglycerides of 97, HDL 67, VLDL 19, and LDL 61.  An iron profile was also  ordered with iron level less than 10.   EKG reveals normal sinus rhythm, normal EKG with a heart rate of 74.  Chest  x-ray shows subtle air space disease at the bases, relatively unremarkable.   HOSPITAL COURSE:  The patient was admitted to [facility] and was  evaluated for right-sided deficits.  Shortly after administration of TPA  through the emergency room, the patient appeared to have somewhat variable  signs and symptoms on the clinical examination intermittently using the  right arm and right leg and then other times not being able to use the arm  and leg, using the right hand with compensation movements.  The visual field  deficit suddenly switched to the left side rather than the right side.  The  patient had baby talking off and on.  The patient appeared to have an  examination more  consistent with an hysterical deficit or malingering  problem.  The patient was inquiring about getting on disability through [employer]  in the emergency room.  The patient was set up for MRI scan of the brain  which was done and was unremarkable and no evidence of an acute stroke was  noted, even with persistence of symptoms.  MR angiogram, however, showed  significant intracranial disease, no significant extracranial disease.  The  patient was placed on aspirin and will be discharged today.  The patient was  asked not to continue to use cocaine although she denies the use of this  drug.  It is not clear what the source of the benzodiazepines was.  The  patient appears to have a low iron profile and we will initiate iron therapy  prior to discharge.  The patient will follow up with [facility] on an as-needed basis.  The patient will be on a regular, no  added salt diet.  It was felt, once again, that this patient has an  hysterical right-sided weakness pattern.      CKW/MEDQ  D:  11/06/2004  T:  11/06/2004  Job:  [id number]   cc:   [facility]  [address]  [place], [place]

## 2011-01-12 NOTE — H&P (Signed)
NAMECAMIRA, Weber NO.:  [id number]   MEDICAL RECORD NO.:  [id number]          PATIENT TYPE:  EMS   LOCATION:  MAJO                         FACILITY:  [facility]   PHYSICIAN:  Marlan Palau, M.D.  DATE OF BIRTH:  1961-05-10   DATE OF ADMISSION:  11/04/2004  DATE OF DISCHARGE:                                HISTORY & PHYSICAL   HISTORY OF PRESENT ILLNESS:  Marissa Weber is a 50 year old, right-handed,  black female born 1960-10-15, without any preexisting risk factors for  stroke except for tobacco abuse and possibly alcohol abuse.  This patient  has come to the [facility] Emergency Room with what she believes was onset  of deficit around 5 to 5:30 this afternoon while taking a bath.  The patient  noted some right-sided weakness, vision change off the right.  Denies any  headache.  The patient was brought in through EMS.  CT scan of the brain was  unremarkable.  Examination today does reveal evidence of a complete, right-  sided hemianesthesia.  The patient has variable motor testing, at times will  not move the right arm or right leg at all; other times actually uses the  right hand in conversational movements.  The patient has a prominent  homonymous visual field deficit.  The patient has normal speech pattern, no  aphasia noted.  The patient is brought in for further evaluation at this  point.   PAST MEDICAL HISTORY:  1.  New onset of right hemiparesis, right hemianesthesia, visual field      deficit.  Rule out left brain stroke.  2.  Left ankle fracture status post surgery.  3.  Obesity.  4.  Degenerative arthritis.  5.  History of C section.   MEDICATIONS:  The patient is on no medications.   HABITS:  Smokes one-half cigarettes a day, drinks beer daily, claims one to  2 40-ounce beers a day.   ALLERGIES:  The patient again states no known drug allergies.   SOCIAL HISTORY:  The patient is widowed, lives in the [place] area, is  not working.   Has five children who are alive and well.   FAMILY MEDICAL HISTORY:  Notable that mother died with complications of  diabetes.  The patient's father is alive and well.  The patient is an only  child.   REVIEW OF SYSTEMS:  Notable for no recent fevers or chills.  The patient  denies any headache, denies any problems with breathing, chest pain,  shortness of breath, abdominal pain.  Had some nausea and vomiting two days  ago.  Denies any problems controlling the bowels or bladder.  Has not had  any blackout episodes.   PHYSICAL EXAMINATION:  VITAL SIGNS:  Blood pressure 103/54, heart rate 86,  respiratory rate 20, temperature afebrile.  GENERAL:  This patient is a moderately obese black female who is alert and  cooperative at the time of examination, somewhat restless.  HEENT:  Head is atraumatic.  Eyes: Pupils equal, round, and reactive to  light.  Disks are flat bilaterally.  NECK:  Supple.  No carotid bruits noted.  RESPIRATORY:  Examination is clear.  CARDIOVASCULAR:  Regular rate and rhythm with no obvious murmurs or rubs  noted.  ABDOMEN:  Positive bowel sounds.  No organomegaly or tenderness noted.  EXTREMITIES:  Without significant edema.  NEUROLOGIC:  Cranial nerves as above.  Facial symmetry is present.  The  patient does not cut the eyes over to the right well at times; at other  times, seems to do better.  Does not blink to threat from either side.  The  patient notes complete loss of sensation on the right side of the face to  pinprick, soft touch sensation and does not split the midline with vibratory  sensation.  The patient has variable motor testing with the right arm at  times totally floppy, at other times able to hold the arm up.  Will not let  the arm hit her face as it drops.  At other times, she actually uses the  hand with conversational movements.  The left leg again offers no  spontaneous movement, but at other times will move it on her own.  The  patient  again has complete loss of sensation to all modalities on the right  arm and right leg as compared to the left.  The patient has what appears to  be a homonymous visual field deficit off to the right.  The patient has  symmetric reflexes.  Toes are neutral bilaterally.  The patient is able to  perform finger-nose-finger with the left upper extremity, seems to be ataxic  with the left lower extremity with heel-to-shin. Was not ambulated.  The  patient is able to pull herself up and sit upright on her own.   LABORATORY DATA AND OTHER STUDIES:  Laboratory values notable for sodium of  139, potassium 3.8, chloride 108, CO2 23, glucose 105, BUN 6, creatinine  0.7.  Calcium 8.6, total protein 7.0, albumin 3.3, AST 29, ALT 19, alkaline  phosphatase 69, total bilirubin 0.4.  The patient has an INR of 0.09.  White  count 9.2, hemoglobin 9.0, hematocrit 28.8, MCV 58.6, platelets 574.  Alcohol level is 264.   Chest x-ray and EKG are pending.   At this time, CT of the head is unremarkable.   IMPRESSION:  New onset of right-sided weakness and numbness, visual field  change.  Rule out left brain stroke.   This patient really has minimal risk factors for stroke events.  The patient  is normotensive.  The patient's examination is somewhat unusual in that  motor testing is somewhat variable, but I cannot say definitely that the  examination is hysterical or evidence of manipulation.  For this reason, I  suspect this patient may be a candidate for tPA.  Will initiate this  treatment at this point and admit this patient for further evaluation.   PLAN:  1.  IV tPA now.  2.  Admission to intensive care unit.  3.  MRI of the brain.  4.  MR angiogram of intracranial and extracranial vessels.  5.  A 2-D echocardiogram.  6.  Urine drug screen, iron panel.  7.  PT/OT and speech therapy when appropriate. 8.  Will follow patient's clinical course while in house.      CKW/MEDQ  D:  11/04/2004  T:   11/04/2004  Job:  [id number]

## 2011-02-19 ENCOUNTER — Encounter (HOSPITAL_COMMUNITY)
Admission: RE | Admit: 2011-02-19 | Discharge: 2011-02-19 | Disposition: A | Discharge status: Home / Self Care | Payer: Medicaid Other | Source: Ambulatory Visit | Attending: Neurosurgery | Admitting: Neurosurgery

## 2011-02-19 ENCOUNTER — Other Ambulatory Visit (HOSPITAL_COMMUNITY): Payer: Self-pay | Admitting: Neurosurgery

## 2011-02-19 ENCOUNTER — Ambulatory Visit (HOSPITAL_COMMUNITY)
Admission: RE | Admit: 2011-02-19 | Discharge: 2011-02-19 | Disposition: A | Discharge status: Home / Self Care | Payer: Medicaid Other | Source: Ambulatory Visit | Attending: Neurosurgery | Admitting: Neurosurgery

## 2011-02-19 DIAGNOSIS — Z01812 Encounter for preprocedural laboratory examination: Secondary | ICD-10-CM | POA: Insufficient documentation

## 2011-02-19 DIAGNOSIS — Z01818 Encounter for other preprocedural examination: Secondary | ICD-10-CM | POA: Insufficient documentation

## 2011-02-19 DIAGNOSIS — Z01811 Encounter for preprocedural respiratory examination: Secondary | ICD-10-CM

## 2011-02-19 DIAGNOSIS — Z01810 Encounter for preprocedural cardiovascular examination: Secondary | ICD-10-CM | POA: Insufficient documentation

## 2011-02-19 LAB — CBC
HCT: 40.8 % (ref 36.0–46.0)
Hemoglobin: 13.6 g/dL (ref 12.0–15.0)
MCH: 23.5 pg — ABNORMAL LOW (ref 26.0–34.0)
MCV: 70.5 fL — ABNORMAL LOW (ref 78.0–100.0)
RBC: 5.79 MIL/uL — ABNORMAL HIGH (ref 3.87–5.11)

## 2011-02-19 LAB — TYPE AND SCREEN: ABO/RH(D): O POS

## 2011-02-19 LAB — BASIC METABOLIC PANEL
Chloride: 100 mEq/L (ref 96–112)
GFR calc non Af Amer: >60 mL/min (ref >60)
Glucose, Bld: 101 mg/dL — ABNORMAL HIGH (ref 70–99)
Potassium: 4.2 mEq/L (ref 3.5–5.1)
Sodium: 135 mEq/L (ref 135–145)

## 2011-02-19 LAB — SURGICAL PCR SCREEN
MRSA, PCR: NEGATIVE
Staphylococcus aureus: NEGATIVE

## 2011-02-22 ENCOUNTER — Inpatient Hospital Stay (HOSPITAL_COMMUNITY): Payer: Medicaid Other

## 2011-02-22 ENCOUNTER — Inpatient Hospital Stay (HOSPITAL_COMMUNITY)
Admission: RE | Admit: 2011-02-22 | Discharge: 2011-02-28 | DRG: 460 | Disposition: A | Discharge status: Home / Self Care | Payer: Medicaid Other | Source: Ambulatory Visit | Attending: Neurosurgery | Admitting: Neurosurgery

## 2011-02-22 DIAGNOSIS — Q762 Congenital spondylolisthesis: Principal | ICD-10-CM

## 2011-02-22 DIAGNOSIS — E669 Obesity, unspecified: Secondary | ICD-10-CM | POA: Diagnosis present

## 2011-02-22 DIAGNOSIS — I10 Essential (primary) hypertension: Secondary | ICD-10-CM | POA: Diagnosis present

## 2011-02-22 DIAGNOSIS — M48061 Spinal stenosis, lumbar region without neurogenic claudication: Secondary | ICD-10-CM | POA: Diagnosis present

## 2011-03-27 NOTE — Op Note (Signed)
NAMETOMMY, MINICHIELLO NO.:  [id number]  MEDICAL RECORD NO.:  [id number]  LOCATION:  3032                         FACILITY:  [facility]  PHYSICIAN:  Reinaldo Meeker, M.D. DATE OF BIRTH:  14-Jun-1961  DATE OF PROCEDURE:  02/22/2011 DATE OF DISCHARGE:                              OPERATIVE REPORT   PREOPERATIVE DIAGNOSIS:  Spondylolisthesis with stenosis of L3-L4, L4- L5.  POSTOPERATIVE DIAGNOSIS:  Spondylolisthesis with stenosis of L3-L4, L4- L5.  PROCEDURES:  L3-L4, L4-L5 decompressive laminectomy with decompression L3, L4, and L5 nerve roots more so than needed for posterior lumbar interbody fusion with bony spacer and PEEK interbody spacer at each level followed by segmental instrumentation L3-L4, L4-L5 with Sequoia pedicle screw system followed by L3-L4, L4-L5 posterolateral fusion.  SURGEON:  Reinaldo Meeker, MD  ASSISTANT:  Tia Alert, MD  PROCEDURE IN DETAIL:  After placing in the prone position, the patient's back was prepped and draped in usual sterile fashion.  Localizing fluoroscopy was used prior to incision to identify the appropriate level.  Midline incision was made over the spinous processes of L3, L4, and L5.  Using Bovie cutting current, the incision was carried down the spinous processes.  Subperiosteal dissection was then carried out bilaterally on the spinous processes, lamina, facet joint to the far lateral region to identify the transverse processes of L3, L4, and L5 bilaterally.  Self-retaining retractor was placed for exposure and x- rays showed approach to be at the appropriate levels.  Spinous processes were removed.  Complete laminectomy on the left of L3, L4, and L5 was performed to decompress the L3, L4, and L5 nerve roots more so than needed for posterior lumbar interbody fusion.  Similar decompression was then carried out on the opposite side.  We also removed the medial three- quarters of the facet joints bilaterally at  L3-L4 and L4-L5 to make access to the disk easier.  We then did bilateral microdiskectomy at L3- L4 and L4-L5.  We then placed pedicle screws bilaterally at L3, L4, and L5.  We used fluoroscopy to follow the awl down and tapped with a 5.5 tap and 6.5 x 40-mm screws bilaterally at all 3 levels.  This was followed in excellent position.  We then performed posterior lumbar interbody fusion.  At L3-L4, we distracted the disk space up to a 10-mm size with a rotating cutter, placed a PEEK interbody spacer filled with EquivaBone and autologous bone.  On the opposite side, we used the rotator once again.  We then packed the midline with EquivaBone blood and autologous bone and then placed a bony spacer of a 10 mm size. These were followed in excellent position under fluoroscopy.  We then turned our attention to L4-L5.  At this level, with some more collapse, we distracted up to an 8 mm size and felt this was an excellent fit.  We then used the rotating cutter, placed the PEEK interbody spacer filled with autologous bone and EquivaBone.  On the opposite side, we once again used the rotating cutter, placed EquivaBone and autologous bone deep in the midline to help with interbody fusion, then placed the bony spacer on this side.  Fluoroscopy showed these to be in excellent position.  We then irrigated copiously with antibiotic irrigation.  We decorticated the far lateral region, placed a mixture of EquivaBone and autologous bone for posterolateral fusion.  We then chosen an 80-mm rod, secured it to the top of the screws, and then did final tightening with torque and counter torque.  We compressed as we tightened.  We then placed a cross-link and secured in the standard fashion.  We then placed Gelfoam over the dura.  Final fluoroscopy in AP and lateral directions showed excellent placement of the screws and interbody devices.  We then left a Hemovac drain in the epidural space and brought it out  through a separate stab incision.  We then closed the wound with multiple layers of Vicryl in the muscle, fascia, subcutaneous, subcuticular tissues and staples were placed on the skin.  Sterile dressing was then applied, and the patient was extubated and taken to the recovery room in stable condition.          ______________________________ Reinaldo Meeker, M.D.     ROK/MEDQ  D:  02/22/2011  T:  02/23/2011  Job:  [id number]  Electronically Signed by Aliene Beams M.D. on 03/27/2011 02:09:03 PM

## 2011-04-10 ENCOUNTER — Ambulatory Visit
Admission: RE | Admit: 2011-04-10 | Discharge: 2011-04-10 | Disposition: A | Discharge status: Home / Self Care | Payer: Medicaid Other | Source: Ambulatory Visit | Attending: Neurosurgery | Admitting: Neurosurgery

## 2011-04-10 ENCOUNTER — Other Ambulatory Visit: Payer: Self-pay | Admitting: Neurosurgery

## 2011-04-10 DIAGNOSIS — M25559 Pain in unspecified hip: Secondary | ICD-10-CM

## 2011-04-10 DIAGNOSIS — M533 Sacrococcygeal disorders, not elsewhere classified: Secondary | ICD-10-CM

## 2011-04-10 DIAGNOSIS — M48061 Spinal stenosis, lumbar region without neurogenic claudication: Secondary | ICD-10-CM

## 2011-05-05 ENCOUNTER — Encounter (HOSPITAL_COMMUNITY): Payer: Self-pay | Admitting: *Deleted

## 2011-05-05 ENCOUNTER — Inpatient Hospital Stay (HOSPITAL_COMMUNITY): Payer: Medicaid Other

## 2011-05-05 ENCOUNTER — Inpatient Hospital Stay (HOSPITAL_COMMUNITY)
Admission: AD | Admit: 2011-05-05 | Discharge: 2011-05-06 | Disposition: A | Discharge status: Home / Self Care | Payer: Medicaid Other | Source: Ambulatory Visit | Attending: Obstetrics & Gynecology | Admitting: Obstetrics & Gynecology

## 2011-05-05 DIAGNOSIS — A499 Bacterial infection, unspecified: Secondary | ICD-10-CM

## 2011-05-05 DIAGNOSIS — R102 Pelvic and perineal pain: Secondary | ICD-10-CM

## 2011-05-05 DIAGNOSIS — B9689 Other specified bacterial agents as the cause of diseases classified elsewhere: Secondary | ICD-10-CM | POA: Insufficient documentation

## 2011-05-05 DIAGNOSIS — N949 Unspecified condition associated with female genital organs and menstrual cycle: Secondary | ICD-10-CM | POA: Insufficient documentation

## 2011-05-05 DIAGNOSIS — N760 Acute vaginitis: Secondary | ICD-10-CM | POA: Insufficient documentation

## 2011-05-05 LAB — URINALYSIS, ROUTINE W REFLEX MICROSCOPIC
Bilirubin Urine: NEGATIVE
Ketones, ur: NEGATIVE mg/dL
Nitrite: NEGATIVE
Specific Gravity, Urine: <1.005 — ABNORMAL LOW (ref 1.005–1.030)
Urobilinogen, UA: 0.2 mg/dL (ref 0.0–1.0)

## 2011-05-05 LAB — CBC
MCH: 20.7 pg — ABNORMAL LOW (ref 26.0–34.0)
MCHC: 30.4 g/dL (ref 30.0–36.0)
Platelets: 435 10*3/uL — ABNORMAL HIGH (ref 150–400)
RBC: 5.41 MIL/uL — ABNORMAL HIGH (ref 3.87–5.11)

## 2011-05-05 LAB — WET PREP, GENITAL: Trich, Wet Prep: NONE SEEN

## 2011-05-05 LAB — RAPID URINE DRUG SCREEN, HOSP PERFORMED: Opiates: NOT DETECTED

## 2011-05-05 MED ORDER — METRONIDAZOLE 500 MG PO TABS: 500.0000 mg | ORAL_TABLET | Freq: Two times a day (BID) | ORAL | Status: AC

## 2011-05-05 MED ORDER — KETOROLAC TROMETHAMINE 60 MG/2ML IM SOLN
60.0000 mg | Freq: Once | INTRAMUSCULAR | Status: AC
  Administered 2011-05-05: 60 mg via INTRAMUSCULAR
  Filled 2011-05-05: qty 2

## 2011-05-05 MED ORDER — DICLOFENAC-MISOPROSTOL 75-0.2 MG PO TABS: 1.0000 | ORAL_TABLET | Freq: Two times a day (BID) | ORAL | Status: DC

## 2011-05-05 NOTE — ED Provider Notes (Signed)
Chief Complaint:  Abdominal Pain   Marissa Weber is  50 y.o. [phone].  No LMP recorded. Patient is postmenopausal..  Her pregnancy status is negative.  She presents complaining of Abdominal Pain . Onset is described as sudden and has been present for  3 days. Reports colicky LLQ pain that comes in wave. Unrelieved by Benjiman Core. Denies nausea, vomiting, diarrhea, fever, chills, dysuria, or vag blding/discharge  Obstetrical/Gynecological History: OB History    Grav Para Term Preterm Abortions TAB SAB Ect Mult Living   4 4 4       5       Past Medical History: History reviewed. No pertinent past medical history.  Past Surgical History: Past Surgical History  Procedure Date  . Cesarean section   . Back surgery   . Ankle surgery     Family History: No family history on file.  Social History: History  Substance Use Topics  . Smoking status: Current Everyday Smoker  . Smokeless tobacco: Not on file  . Alcohol Use: Yes     occ beer and wine    Allergies: No Known Allergies  Prescriptions prior to admission  Medication Sig Dispense Refill  . PRESCRIPTION MEDICATION Take 1 tablet by mouth daily. Samples of heart medication given by MD         Review of Systems - Negative except what has been reviewed in the HPI  Physical Exam   Blood pressure 112/75, pulse 67, temperature 97.9 F (36.6 C), temperature source Oral, resp. rate 18, height 4' 10.5" (1.486 m), weight 87 kg (191 lb 12.8 oz).  General: General appearance - alert, well appearing, and in no distress and oriented to person, place, and time Mental status - alert, oriented to person, place, and time, normal mood, behavior, speech, dress, motor activity, and thought processes Chest - clear to auscultation, no wheezes, rales or rhonchi, symmetric air entry Heart - normal rate, regular rhythm, normal S1, S2, no murmurs, rubs, clicks or gallops Abdomen - tenderness noted LLQ no rebound tenderness noted bowel sounds  normal no bladder distension noted no CVA tenderness no hernias noted Back exam - full range of motion, no tenderness, palpable spasm or pain on motion Focused Gynecological Exam: normal external genitalia, vulva, vagina, cervix, uterus and adnexa  Labs: Recent Results (from the past 24 hour(s))  URINALYSIS, ROUTINE W REFLEX MICROSCOPIC   Collection Time   05/05/11  8:53 PM      Component Value Range   Color, Urine YELLOW  YELLOW    Appearance CLEAR  CLEAR    Specific Gravity, Urine <1.005 (*) 1.005 - 1.030    pH 5.5  5.0 - 8.0    Glucose, UA NEGATIVE  NEGATIVE (mg/dL)   Hgb urine dipstick NEGATIVE  NEGATIVE    Bilirubin Urine NEGATIVE  NEGATIVE    Ketones, ur NEGATIVE  NEGATIVE (mg/dL)   Protein, ur NEGATIVE  NEGATIVE (mg/dL)   Urobilinogen, UA 0.2  0.0 - 1.0 (mg/dL)   Nitrite NEGATIVE  NEGATIVE    Leukocytes, UA NEGATIVE  NEGATIVE   URINE RAPID DRUG SCREEN (HOSP PERFORMED)   Collection Time   05/05/11  9:00 PM      Component Value Range   Opiates NONE DETECTED  NONE DETECTED    Cocaine NONE DETECTED  NONE DETECTED    Benzodiazepines NONE DETECTED  NONE DETECTED    Amphetamines NONE DETECTED  NONE DETECTED    Tetrahydrocannabinol NONE DETECTED  NONE DETECTED    Barbiturates NONE DETECTED  NONE DETECTED   CBC   Collection Time   05/05/11  9:35 PM      Component Value Range   WBC 9.4  4.0 - 10.5 (K/uL)   RBC 5.41 (*) 3.87 - 5.11 (MIL/uL)   Hemoglobin 11.2 (*) 12.0 - 15.0 (g/dL)   HCT 86.5  78.4 - 69.6 (%)   MCV 68.2 (*) 78.0 - 100.0 (fL)   MCH 20.7 (*) 26.0 - 34.0 (pg)   MCHC 30.4  30.0 - 36.0 (g/dL)   RDW 29.5 (*) 28.4 - 15.5 (%)   Platelets 435 (*) 150 - 400 (K/uL)  WET PREP, GENITAL   Collection Time   05/05/11 10:05 PM      Component Value Range   Yeast, Wet Prep NONE SEEN  NONE SEEN    Trich, Wet Prep NONE SEEN  NONE SEEN    Clue Cells, Wet Prep MODERATE (*) NONE SEEN    WBC, Wet Prep HPF POC FEW (*) NONE SEEN    Imaging Studies:  RADIOLOGY REPORT*  Clinical  Data: Left lower quadrant abdominal pain and pelvic pain.  TRANSABDOMINAL AND TRANSVAGINAL ULTRASOUND OF PELVIS  Technique: Both transabdominal and transvaginal ultrasound  examinations of the pelvis were performed. Transabdominal technique  was performed for global imaging of the pelvis including uterus,  ovaries, adnexal regions, and pelvic cul-de-sac.  Comparison: Prior pelvic ultrasound performed 06/16/2008, and CT of  the abdomen and pelvis performed 01/01/2011  It was necessary to proceed with endovaginal exam following the  transabdominal exam to visualize the endometrium and left ovary.  Findings:  Uterus: A 4.1 x 3.8 x 2.7 cm predominantly submucosal fibroid is  noted extending into the endometrial canal, on the left side of the  uterus; this appears to have decreased in size from the prior  studies. The uterus is otherwise unremarkable in appearance; it  measures 10.9 cm in length, 6.1 cm in AP dimension and 7.2 cm in  transverse dimension.  Endometrium: Status post ablation; the endometrial canal is  difficult to fully assess due to the fibroid, and contains a trace  amount of fluid. No significant endometrial echo complex  thickening is seen.  Right ovary: Normal appearance/no adnexal mass; the right ovary  measures 3.7 x 1.6 x 2.7 cm.  Left ovary: Not well characterized; may measure approximately 3.8 x  1.6 x 2.1 cm.  Other findings: No free fluid is seen within the pelvic cul-de-sac.  IMPRESSION:  Interval decrease in size of 4.1 cm submucosal fibroid; trace fluid  noted within the endometrial canal, status post ablation, without  evidence of thickening. Uterus otherwise unremarkable in  appearance; left ovary not well characterized, but appears grossly  unremarkable.  Original Report Authenticated By: Tonia Ghent, M.D.        Assessment: Pelvic Pain Bacterial Vaginosis  Plan: Discharge Home Rx Flagyl 500mg  po BID Rx Diclofenac 75mg  BID PRN FU in [facility], clinic staff will call with date/time of appt.  SHORES,SUZANNE E. 05/05/2011,11:47 PM   05/06/2011 [facility] [address] called to say that [employer] was not covered by [employer], but individually diclofenac and misoprostol are covered- OKed to give 2 separate prescriptions for pt.

## 2011-05-05 NOTE — ED Notes (Signed)
[address] CNM in to see pt.

## 2011-05-05 NOTE — Progress Notes (Signed)
[address] CNM in to see pt. Spec exam done and wet prep and GC/Chlam obtained. Pt tol well.

## 2011-05-05 NOTE — ED Notes (Signed)
Pt to u/s via w/c. Feels alittle better

## 2011-05-05 NOTE — Progress Notes (Signed)
G4P5 all C/Ss. Pain LLQ since Thurs night. Cramping. Comes in waves. No bleeding or d/c.

## 2011-05-05 NOTE — ED Notes (Signed)
Pt returned from u/s

## 2011-05-06 NOTE — Progress Notes (Signed)
Written and verbal d/c instructions given and understanding voiced. 

## 2011-05-07 LAB — GC/CHLAMYDIA PROBE AMP, GENITAL
Chlamydia, DNA Probe: NEGATIVE
GC Probe Amp, Genital: NEGATIVE

## 2011-05-18 LAB — POCT PREGNANCY, URINE
Operator id: 22333
Preg Test, Ur: NEGATIVE

## 2011-05-18 LAB — CBC
HCT: 20.8 — ABNORMAL LOW
MCHC: 29.6 — ABNORMAL LOW
MCV: 54.1 — ABNORMAL LOW
Platelets: 187
RBC: 3.85 — ABNORMAL LOW
WBC: 6.6

## 2011-05-18 LAB — WET PREP, GENITAL
Clue Cells Wet Prep HPF POC: NONE SEEN
Trich, Wet Prep: NONE SEEN

## 2011-05-18 LAB — URINE MICROSCOPIC-ADD ON

## 2011-05-18 LAB — URINALYSIS, ROUTINE W REFLEX MICROSCOPIC
Bilirubin Urine: NEGATIVE
Nitrite: NEGATIVE
Specific Gravity, Urine: >1.03 — ABNORMAL HIGH
Urobilinogen, UA: 0.2

## 2011-05-21 ENCOUNTER — Other Ambulatory Visit: Payer: Self-pay | Admitting: Neurosurgery

## 2011-05-21 ENCOUNTER — Ambulatory Visit
Admission: RE | Admit: 2011-05-21 | Discharge: 2011-05-21 | Disposition: A | Discharge status: Home / Self Care | Payer: Medicaid Other | Source: Ambulatory Visit | Attending: Neurosurgery | Admitting: Neurosurgery

## 2011-05-21 DIAGNOSIS — M48061 Spinal stenosis, lumbar region without neurogenic claudication: Secondary | ICD-10-CM

## 2011-05-25 LAB — URINALYSIS, ROUTINE W REFLEX MICROSCOPIC
Nitrite: NEGATIVE
Protein, ur: NEGATIVE
Urobilinogen, UA: 0.2

## 2011-05-25 LAB — POCT PREGNANCY, URINE: Preg Test, Ur: NEGATIVE

## 2011-05-28 LAB — CROSSMATCH

## 2011-05-28 LAB — URINALYSIS, ROUTINE W REFLEX MICROSCOPIC
Glucose, UA: NEGATIVE
Protein, ur: NEGATIVE
Specific Gravity, Urine: 1.025
pH: 6

## 2011-05-28 LAB — COMPREHENSIVE METABOLIC PANEL
Alkaline Phosphatase: 45
BUN: 5 — ABNORMAL LOW
CO2: 25
Chloride: 102
GFR calc non Af Amer: >60
Glucose, Bld: 115 — ABNORMAL HIGH
Potassium: 3.6
Total Bilirubin: 0.8

## 2011-05-28 LAB — CBC
Hemoglobin: 5.7 — CL
Platelets: 817 — ABNORMAL HIGH
RBC: 3.37 — ABNORMAL LOW
RDW: 38.6 — ABNORMAL HIGH
RDW: 40.1 — ABNORMAL HIGH
WBC: 13.6 — ABNORMAL HIGH

## 2011-05-28 LAB — WET PREP, GENITAL: Yeast Wet Prep HPF POC: NONE SEEN

## 2011-05-28 LAB — POCT PREGNANCY, URINE: Preg Test, Ur: NEGATIVE

## 2011-05-28 LAB — ABO/RH: ABO/RH(D): O POS

## 2011-05-28 LAB — GC/CHLAMYDIA PROBE AMP, GENITAL: Chlamydia, DNA Probe: NEGATIVE

## 2011-05-28 NOTE — Discharge Summary (Signed)
  NAMERONELLA, PLUNK NO.:  [id number]  MEDICAL RECORD NO.:  [id number]  LOCATION:  3032                         FACILITY:  [facility]  PHYSICIAN:  Reinaldo Meeker, M.D. 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  PROCEDURES:  L3-4, L4-5 posterior lumbar interbody fusion with pedicle screw fixation.  HISTORY:  This lady is a 49 year old female who was evaluated with a back and bilateral lower extremity pain.  She is tried on variety of conservative therapies all without improvement.  She had a myelogram and post-myelographic CT, which showed marked stenosis and listhesis of L3-4 and L4-5.  She was therefore admitted at this time for two-level posterior lumbar interbody fusion.  She underwent above-mentioned procedure on February 22, 2011.  After surgery, she mostly had incisional pain, was somewhat slow to increase her activity.  She also developed an ileus, which slowed up her progress.  She was very slow to her increase her activity, and by February 26, 2011, she now walks in the hall.  By February 27, 2011, she was beginning to slowly increase her activity.  She did have some bloody drainage from her incision, but otherwise was doing fairly well.  By February 28, 2011, she was up and ambulating better.  Her pain was slowly decreasing and wound had decrease in drainage.  It was felt that we could discharge her home.  Discharge medications include her pain medicine as well as her normal preoperative medical regimen.  Her condition was improved versus admission.          ______________________________ Reinaldo Meeker, M.D.     ROK/MEDQ  D:  03/27/2011  T:  03/28/2011  Job:  [id number]  Electronically Signed by Aliene Beams M.D. on 05/28/2011 08:28:16 PM

## 2011-06-20 ENCOUNTER — Encounter: Payer: Medicaid Other | Admitting: Obstetrics & Gynecology

## 2011-07-27 ENCOUNTER — Other Ambulatory Visit: Payer: Self-pay | Admitting: Orthopedic Surgery

## 2011-10-15 ENCOUNTER — Encounter (HOSPITAL_COMMUNITY): Payer: Self-pay

## 2011-10-15 ENCOUNTER — Encounter (HOSPITAL_COMMUNITY): Payer: Self-pay | Admitting: Pharmacy Technician

## 2011-10-15 ENCOUNTER — Encounter (HOSPITAL_COMMUNITY)
Admission: RE | Admit: 2011-10-15 | Discharge: 2011-10-15 | Disposition: A | Discharge status: Home / Self Care | Payer: Medicaid Other | Source: Ambulatory Visit | Attending: Orthopedic Surgery | Admitting: Orthopedic Surgery

## 2011-10-15 HISTORY — DX: Encounter for other specified aftercare: Z51.89

## 2011-10-15 HISTORY — DX: Unspecified osteoarthritis, unspecified site: M19.90

## 2011-10-15 HISTORY — DX: Essential (primary) hypertension: I10

## 2011-10-15 LAB — COMPREHENSIVE METABOLIC PANEL
BUN: 4 mg/dL — ABNORMAL LOW (ref 6–23)
Calcium: 9.7 mg/dL (ref 8.4–10.5)
GFR calc Af Amer: >90 mL/min (ref >90)
Glucose, Bld: 90 mg/dL (ref 70–99)
Sodium: 135 mEq/L (ref 135–145)
Total Protein: 8.2 g/dL (ref 6.0–8.3)

## 2011-10-15 LAB — ABO/RH: ABO/RH(D): O POS

## 2011-10-15 LAB — CBC
HCT: 37.9 % (ref 36.0–46.0)
Hemoglobin: 11.6 g/dL — ABNORMAL LOW (ref 12.0–15.0)
MCH: 20.4 pg — ABNORMAL LOW (ref 26.0–34.0)
MCHC: 30.6 g/dL (ref 30.0–36.0)

## 2011-10-15 LAB — URINALYSIS, ROUTINE W REFLEX MICROSCOPIC
Protein, ur: NEGATIVE mg/dL
Urobilinogen, UA: 1 mg/dL (ref 0.0–1.0)

## 2011-10-15 LAB — SURGICAL PCR SCREEN: MRSA, PCR: NEGATIVE

## 2011-10-15 LAB — PROTIME-INR: Prothrombin Time: 13.9 seconds (ref 11.6–15.2)

## 2011-10-15 LAB — URINE MICROSCOPIC-ADD ON

## 2011-10-15 MED ORDER — CEFAZOLIN SODIUM 1-5 GM-% IV SOLN: 1.0000 g | INTRAVENOUS | Status: DC

## 2011-10-15 NOTE — Pre-Procedure Instructions (Signed)
10/15/11 per office of Dr Algie Coffer MD has not yet printed off office visit note of 10/09/11 nor Echo results.  MD is out of town per office until 10/25/11.  Info not available per office.

## 2011-10-15 NOTE — Pre-Procedure Instructions (Signed)
10/15/11 Reported BUN of 4 - Left message on voice mail of Harley Hallmark , surgery scheduler for Dr Lequita Halt.  They stated no one else available.

## 2011-10-15 NOTE — Pre-Procedure Instructions (Signed)
11/06/10 EKG on chart  02/19/11 EKG and CXR in [facility]  Office visit note ( cardiology ) Dr Algie Coffer 10/02/11 on chart

## 2011-10-15 NOTE — Patient Instructions (Signed)
20 Marissa Weber  10/15/2011   Your procedure is scheduled on:  10/15/11 155pm-315 pm  Report to [facility] at 1155 AM.  Call this number if you have problems the morning of surgery: [phone]   Remember:   Do not eat food:After Midnight.  May have clear liquids:until 0730am then npo .    Take these medicines the morning of surgery with A SIP OF WATER:    Do not wear jewelry, make-up or nail polish.  Do not wear lotions, powders, or perfumes.   Do not shave 48 hours prior to surgery.  Do not bring valuables to the hospital.  Contacts, dentures or bridgework may not be worn into surgery.  Leave suitcase in the car. After surgery it may be brought to your room.  For patients admitted to the hospital, checkout time is 11:00 AM the day of discharge.     Special Instructions: CHG Shower Use Special Wash: 1/2 bottle night before surgery and 1/2 bottle morning of surgery. Shower chin to toes with CHG.  Wash face and private parts with regular soap.     Please read over the following fact sheets that you were given: MRSA Information, Incentive Spirometry Fact Sheet, Blood Transfusion FAct Sheet, coughing and deep breathing exercises, leg exercises

## 2011-10-16 NOTE — Pre-Procedure Instructions (Signed)
10/16/11 0940am- Called and spoke with Marchelle Folks- she took a message regarding abnormal lab results on pt.  Call back number as [phone].

## 2011-10-21 ENCOUNTER — Other Ambulatory Visit: Payer: Self-pay | Admitting: Orthopedic Surgery

## 2011-10-21 NOTE — H&P (Signed)
Marissa Weber  DOB: 05/02/1961 Single / Language: English / Race: Black or African American / Female  Date of Admission:  10/22/2011  Chief Complaint:  Right Hip Pain  History of Present Illness The patient is a 50 year old female who comes in today for a preoperative History and Physical. The patient is scheduled for a right total hip arthroplasty to be performed by Dr. Frank V. Aluisio, MD at [facility] on 10/22/2011. The patient is a 50 year old female who presents with a right hip problem. The patient was seen for a second opinion.The patient reports right hip problems including pain, grinding and catching symptoms that have been present for 1 year. The symptoms began without any known injury. Marissa Weber states the right hip is hurting her at all times. It hurts during the day and at night. It is limiting what she can and cannot do. She had a lumbar fusion by Dr. Kritzer, which helped her back pain, but the hip pain is overwhelming now. It is in the groin radiating her to knee. She is not having recurrence of numbness or tingling with this. She cannot walk without the use of assistive devices. The hip does keep her awake at night. She is ready to proceed with surgery. They have been treated conservatively in the past for the above stated problem and despite conservative measures, they continue to have progressive pain and severe functional limitations and dysfunction. They have failed non-operative management. It is felt that they would benefit from undergoing total joint replacement. Risks and benefits of the procedure have been discussed with the patient and they elect to proceed with surgery. There are no active contraindications to surgery such as ongoing infection or rapidly progressive neurological disease.  Allergies No Known Drug Allergies  Medication History Norco 5-325MG   Problem List/Past Medical Osteoarthritis, Hip (715.35) Asthma  Past Surgical  History Ankle Surgery. left Spinal Surgery  Family History Cancer. grandfather fathers side Diabetes Mellitus. mother Hypertension. father Rheumatoid Arthritis. mother  Social History Alcohol use. current drinker; drinks beer; only occasionally per week Children. 5 Current work status. disabled Exercise. Exercises never Illicit drug use. no Living situation. live with partner Marital status. single Number of flights of stairs before winded. 1 Tobacco / smoke exposure. yes Tobacco use. current every day smoker Pregnant. no  Review of Systems  General:Not Present- Chills, Fever, Night Sweats, Fatigue, Weight Gain, Weight Loss and Memory Loss. Skin:Not Present- Hives, Itching, Rash, Eczema and Lesions. HEENT:Not Present- Tinnitus, Headache, Double Vision, Visual Loss, Hearing Loss and Dentures. Respiratory:Not Present- Shortness of breath with exertion, Shortness of breath at rest, Allergies, Coughing up blood and Chronic Cough. Cardiovascular:Not Present- Chest Pain, Racing/skipping heartbeats, Difficulty Breathing Lying Down, Murmur, Swelling and Palpitations. Gastrointestinal:Not Present- Bloody Stool, Heartburn, Abdominal Pain, Vomiting, Nausea, Constipation, Diarrhea, Difficulty Swallowing, Jaundice and Loss of appetitie. Female Genitourinary:Not Present- Blood in Urine, Urinary frequency, Weak urinary stream, Discharge, Flank Pain, Incontinence, Painful Urination, Urgency, Urinary Retention and Urinating at Night. Musculoskeletal:Present- Joint Pain. Not Present- Muscle Weakness, Muscle Pain, Joint Swelling, Back Pain, Morning Stiffness and Spasms. Neurological:Not Present- Tremor, Dizziness, Blackout spells, Paralysis, Difficulty with balance and Weakness. Psychiatric:Not Present- Insomnia.   Vitals Weight: 190 lb Height: 59 in Body Surface Area: 1.89 m Body Mass Index: 38.37 kg/m Pulse: 72 (Regular) Resp.: 16 (Unlabored) BP:  148/92 (Sitting, Right Arm, Standard)    Physical Exam Patient is a 50 year old female with continued hip pain.  General Mental Status - Alert,   cooperative and good historian. General Appearance- pleasant. Not in acute distress. Orientation- Oriented X3. Build & Nutrition- Well nourished and Well developed.  Head and Neck Head- normocephalic, atraumatic . Neck Global Assessment- supple. no bruit auscultated on the right and no bruit auscultated on the left.  ENT Pupil- Bilateral- Regular and Round. Note: wears glasses for reading and driving. Motion- Bilateral- EOMI. upper denture plate  Chest and Lung Exam Auscultation: Breath sounds:- clear at anterior chest wall and - clear at posterior chest wall. Adventitious sounds:- No Adventitious sounds.  Cardiovascular Auscultation:Rhythm- Regular rate and rhythm. Heart Sounds- S1 WNL and S2 WNL. Murmurs & Other Heart Sounds:Auscultation of the heart reveals - No Murmurs.  Abdomen Palpation/Percussion:Tenderness- Abdomen is non-tender to palpation. Rigidity (guarding)- Abdomen is soft. Auscultation:Auscultation of the abdomen reveals - Bowel sounds normal.  Female Genitourinary Not done, not pertinent to present illness  Musculoskeletal On physical exam well developed female, alert and oriented in no apparent distress. Evaluation of her left hip, normal range of motion with no discomfort. Right hip flexion 90, no internal or external rotation, and no abduction. Gait pattern is severely antalgic. Pulses are intact distally with intact strength.  RADIOGRAPHS: AP pelvis and lateral of the right hip show a near ankylosed hip. She is bone on bone throughout with large osteophyte formation. There is absolutely no joint space left. The left hip looks normal.  Assessment & Plan Osteoarthritis Right Hip  Patient is for a Right Total Hip Arthroplasty by Dr. Aluisio.  Plan is to look into  SNF/Rehab.  Drew Dayton Kenley, PA-C  

## 2011-10-22 ENCOUNTER — Encounter (HOSPITAL_COMMUNITY): Payer: Self-pay | Admitting: *Deleted

## 2011-10-22 ENCOUNTER — Encounter (HOSPITAL_COMMUNITY): Payer: Self-pay | Admitting: Anesthesiology

## 2011-10-22 ENCOUNTER — Inpatient Hospital Stay (HOSPITAL_COMMUNITY): Payer: Medicaid Other

## 2011-10-22 ENCOUNTER — Inpatient Hospital Stay (HOSPITAL_COMMUNITY): Payer: Medicaid Other | Admitting: Anesthesiology

## 2011-10-22 ENCOUNTER — Encounter (HOSPITAL_COMMUNITY)
Admission: RE | Disposition: A | Discharge status: Home Health | Payer: Self-pay | Source: Ambulatory Visit | Attending: Orthopedic Surgery

## 2011-10-22 ENCOUNTER — Inpatient Hospital Stay (HOSPITAL_COMMUNITY)
Admission: RE | Admit: 2011-10-22 | Discharge: 2011-10-25 | DRG: 470 | Disposition: A | Discharge status: Home Health | Payer: Medicaid Other | Source: Ambulatory Visit | Attending: Orthopedic Surgery | Admitting: Orthopedic Surgery

## 2011-10-22 DIAGNOSIS — M1610 Unilateral primary osteoarthritis, unspecified hip: Principal | ICD-10-CM | POA: Diagnosis present

## 2011-10-22 DIAGNOSIS — Z01812 Encounter for preprocedural laboratory examination: Secondary | ICD-10-CM

## 2011-10-22 DIAGNOSIS — M169 Osteoarthritis of hip, unspecified: Secondary | ICD-10-CM | POA: Diagnosis present

## 2011-10-22 DIAGNOSIS — I10 Essential (primary) hypertension: Secondary | ICD-10-CM | POA: Diagnosis present

## 2011-10-22 DIAGNOSIS — Z96649 Presence of unspecified artificial hip joint: Secondary | ICD-10-CM

## 2011-10-22 DIAGNOSIS — D62 Acute posthemorrhagic anemia: Secondary | ICD-10-CM | POA: Diagnosis not present

## 2011-10-22 DIAGNOSIS — Z981 Arthrodesis status: Secondary | ICD-10-CM

## 2011-10-22 DIAGNOSIS — E871 Hypo-osmolality and hyponatremia: Secondary | ICD-10-CM | POA: Diagnosis not present

## 2011-10-22 HISTORY — PX: TOTAL HIP ARTHROPLASTY: SHX124

## 2011-10-22 LAB — CBC
MCV: 67.1 fL — ABNORMAL LOW (ref 78.0–100.0)
Platelets: 314 10*3/uL (ref 150–400)
RDW: 21.3 % — ABNORMAL HIGH (ref 11.5–15.5)
WBC: 7.1 10*3/uL (ref 4.0–10.5)

## 2011-10-22 SURGERY — ARTHROPLASTY, HIP, TOTAL,POSTERIOR APPROACH
Anesthesia: General | Site: Hip | Laterality: Right | Wound class: Clean

## 2011-10-22 MED ORDER — CHLORHEXIDINE GLUCONATE 4 % EX LIQD: 60.0000 mL | Freq: Once | CUTANEOUS | Status: DC

## 2011-10-22 MED ORDER — PHENOL 1.4 % MT LIQD: 1.0000 | OROMUCOSAL | Status: DC | PRN

## 2011-10-22 MED ORDER — DEXAMETHASONE SODIUM PHOSPHATE 10 MG/ML IJ SOLN: 10.0000 mg | Freq: Once | INTRAMUSCULAR | Status: DC

## 2011-10-22 MED ORDER — CEFAZOLIN SODIUM 1-5 GM-% IV SOLN
INTRAVENOUS | Status: DC | PRN
  Administered 2011-10-22: 2 g via INTRAVENOUS

## 2011-10-22 MED ORDER — DEXAMETHASONE SODIUM PHOSPHATE 10 MG/ML IJ SOLN
INTRAMUSCULAR | Status: DC | PRN
  Administered 2011-10-22: 10 mg via INTRAVENOUS

## 2011-10-22 MED ORDER — RIVAROXABAN 10 MG PO TABS
10.0000 mg | ORAL_TABLET | Freq: Every day | ORAL | Status: DC
  Administered 2011-10-23 – 2011-10-25 (×3): 10 mg via ORAL
  Filled 2011-10-22 (×3): qty 1

## 2011-10-22 MED ORDER — SODIUM CHLORIDE 0.9 % IV SOLN: INTRAVENOUS | Status: DC

## 2011-10-22 MED ORDER — CEFAZOLIN SODIUM 1-5 GM-% IV SOLN
1.0000 g | Freq: Four times a day (QID) | INTRAVENOUS | Status: AC
  Administered 2011-10-22 – 2011-10-23 (×3): 1 g via INTRAVENOUS
  Filled 2011-10-22 (×4): qty 50

## 2011-10-22 MED ORDER — HYDROCHLOROTHIAZIDE 12.5 MG PO CAPS
12.5000 mg | ORAL_CAPSULE | Freq: Every day | ORAL | Status: DC
Start: 2011-10-23 — End: 2011-10-25
  Administered 2011-10-23 – 2011-10-25 (×3): 12.5 mg via ORAL
  Filled 2011-10-22 (×3): qty 1

## 2011-10-22 MED ORDER — DOCUSATE SODIUM 100 MG PO CAPS
100.0000 mg | ORAL_CAPSULE | Freq: Two times a day (BID) | ORAL | Status: DC
  Administered 2011-10-22 – 2011-10-25 (×6): 100 mg via ORAL
  Filled 2011-10-22 (×7): qty 1

## 2011-10-22 MED ORDER — KCL IN DEXTROSE-NACL 20-5-0.9 MEQ/L-%-% IV SOLN
INTRAVENOUS | Status: DC
  Administered 2011-10-22 – 2011-10-23 (×3): via INTRAVENOUS
  Filled 2011-10-22 (×4): qty 1000

## 2011-10-22 MED ORDER — GLYCOPYRROLATE 0.2 MG/ML IJ SOLN
INTRAMUSCULAR | Status: DC | PRN
  Administered 2011-10-22: .4 mg via INTRAVENOUS

## 2011-10-22 MED ORDER — METOCLOPRAMIDE HCL 10 MG PO TABS
5.0000 mg | ORAL_TABLET | Freq: Three times a day (TID) | ORAL | Status: DC | PRN
Start: 2011-10-22 — End: 2011-10-25

## 2011-10-22 MED ORDER — HYDROMORPHONE HCL PF 1 MG/ML IJ SOLN
INTRAMUSCULAR | Status: DC | PRN
  Administered 2011-10-22 (×2): 1 mg via INTRAVENOUS

## 2011-10-22 MED ORDER — PROPOFOL 10 MG/ML IV EMUL
INTRAVENOUS | Status: DC | PRN
  Administered 2011-10-22: 200 mg via INTRAVENOUS

## 2011-10-22 MED ORDER — ONDANSETRON HCL 4 MG PO TABS: 4.0000 mg | ORAL_TABLET | Freq: Four times a day (QID) | ORAL | Status: DC | PRN

## 2011-10-22 MED ORDER — ACETAMINOPHEN 10 MG/ML IV SOLN
INTRAVENOUS | Status: DC | PRN
  Administered 2011-10-22: 1000 mg via INTRAVENOUS

## 2011-10-22 MED ORDER — 0.9 % SODIUM CHLORIDE (POUR BTL) OPTIME
TOPICAL | Status: DC | PRN
  Administered 2011-10-22: 1000 mL

## 2011-10-22 MED ORDER — BISACODYL 10 MG RE SUPP
10.0000 mg | Freq: Every day | RECTAL | Status: DC | PRN
  Filled 2011-10-22: qty 1

## 2011-10-22 MED ORDER — BUPIVACAINE LIPOSOME 1.3 % IJ SUSP
INTRAMUSCULAR | Status: DC | PRN
  Administered 2011-10-22: 20 mL

## 2011-10-22 MED ORDER — LACTATED RINGERS IV SOLN
INTRAVENOUS | Status: DC | PRN
  Administered 2011-10-22 (×2): via INTRAVENOUS

## 2011-10-22 MED ORDER — DROPERIDOL 2.5 MG/ML IJ SOLN
INTRAMUSCULAR | Status: DC | PRN
  Administered 2011-10-22: .5 mg via INTRAVENOUS

## 2011-10-22 MED ORDER — METHOCARBAMOL 100 MG/ML IJ SOLN
500.0000 mg | Freq: Four times a day (QID) | INTRAMUSCULAR | Status: DC | PRN
  Filled 2011-10-22: qty 5

## 2011-10-22 MED ORDER — DIPHENHYDRAMINE HCL 12.5 MG/5ML PO ELIX
12.5000 mg | ORAL_SOLUTION | ORAL | Status: DC | PRN
Start: 2011-10-22 — End: 2011-10-25

## 2011-10-22 MED ORDER — ACETAMINOPHEN 325 MG PO TABS: 650.0000 mg | ORAL_TABLET | Freq: Four times a day (QID) | ORAL | Status: DC | PRN

## 2011-10-22 MED ORDER — MENTHOL 3 MG MT LOZG: 1.0000 | LOZENGE | OROMUCOSAL | Status: DC | PRN

## 2011-10-22 MED ORDER — ONDANSETRON HCL 4 MG/2ML IJ SOLN: 4.0000 mg | Freq: Four times a day (QID) | INTRAMUSCULAR | Status: DC | PRN

## 2011-10-22 MED ORDER — LIDOCAINE HCL (CARDIAC) 20 MG/ML IV SOLN
INTRAVENOUS | Status: DC | PRN
  Administered 2011-10-22: 30 mg via INTRAVENOUS

## 2011-10-22 MED ORDER — ACETAMINOPHEN 650 MG RE SUPP: 650.0000 mg | Freq: Four times a day (QID) | RECTAL | Status: DC | PRN

## 2011-10-22 MED ORDER — BUPIVACAINE LIPOSOME 1.3 % IJ SUSP
20.0000 mL | Freq: Once | INTRAMUSCULAR | Status: DC
  Filled 2011-10-22: qty 20

## 2011-10-22 MED ORDER — MORPHINE SULFATE 2 MG/ML IJ SOLN
1.0000 mg | INTRAMUSCULAR | Status: DC | PRN
  Administered 2011-10-23 – 2011-10-24 (×5): 2 mg via INTRAVENOUS
  Filled 2011-10-22 (×5): qty 1

## 2011-10-22 MED ORDER — METOCLOPRAMIDE HCL 5 MG/ML IJ SOLN
5.0000 mg | Freq: Three times a day (TID) | INTRAMUSCULAR | Status: DC | PRN
Start: 2011-10-22 — End: 2011-10-25

## 2011-10-22 MED ORDER — METOPROLOL SUCCINATE ER 50 MG PO TB24
50.0000 mg | ORAL_TABLET | Freq: Every day | ORAL | Status: DC
  Administered 2011-10-22: 50 mg via ORAL
  Filled 2011-10-22: qty 1

## 2011-10-22 MED ORDER — METHOCARBAMOL 500 MG PO TABS
500.0000 mg | ORAL_TABLET | Freq: Four times a day (QID) | ORAL | Status: DC | PRN
  Administered 2011-10-23 – 2011-10-25 (×5): 500 mg via ORAL
  Filled 2011-10-22 (×5): qty 1

## 2011-10-22 MED ORDER — ACETAMINOPHEN 10 MG/ML IV SOLN: 1000.0000 mg | Freq: Once | INTRAVENOUS | Status: DC

## 2011-10-22 MED ORDER — SUCCINYLCHOLINE CHLORIDE 20 MG/ML IJ SOLN
INTRAMUSCULAR | Status: DC | PRN
  Administered 2011-10-22: 100 mg via INTRAVENOUS

## 2011-10-22 MED ORDER — ONDANSETRON HCL 4 MG/2ML IJ SOLN
INTRAMUSCULAR | Status: DC | PRN
  Administered 2011-10-22 (×2): 2 mg via INTRAVENOUS

## 2011-10-22 MED ORDER — OXYCODONE HCL 5 MG PO TABS
5.0000 mg | ORAL_TABLET | ORAL | Status: DC | PRN
  Administered 2011-10-22 – 2011-10-23 (×5): 10 mg via ORAL
  Administered 2011-10-23: 5 mg via ORAL
  Administered 2011-10-24 – 2011-10-25 (×7): 10 mg via ORAL
  Filled 2011-10-22 (×14): qty 2

## 2011-10-22 MED ORDER — BUPIVACAINE ON-Q PAIN PUMP (FOR ORDER SET NO CHG)
INJECTION | Status: DC
  Filled 2011-10-22: qty 1

## 2011-10-22 MED ORDER — AMLODIPINE BESYLATE 5 MG PO TABS
5.0000 mg | ORAL_TABLET | Freq: Every day | ORAL | Status: DC
  Administered 2011-10-22 – 2011-10-25 (×2): 5 mg via ORAL
  Filled 2011-10-22 (×4): qty 1

## 2011-10-22 MED ORDER — CISATRACURIUM BESYLATE 2 MG/ML IV SOLN
INTRAVENOUS | Status: DC | PRN
  Administered 2011-10-22: 5 mg via INTRAVENOUS
  Administered 2011-10-22: 3 mg via INTRAVENOUS

## 2011-10-22 MED ORDER — MIDAZOLAM HCL 5 MG/5ML IJ SOLN
INTRAMUSCULAR | Status: DC | PRN
  Administered 2011-10-22: 2 mg via INTRAVENOUS

## 2011-10-22 MED ORDER — METOPROLOL SUCCINATE ER 50 MG PO TB24
50.0000 mg | ORAL_TABLET | Freq: Every day | ORAL | Status: DC
  Administered 2011-10-23 – 2011-10-25 (×3): 50 mg via ORAL
  Filled 2011-10-22 (×3): qty 1

## 2011-10-22 MED ORDER — NEOSTIGMINE METHYLSULFATE 1 MG/ML IJ SOLN
INTRAMUSCULAR | Status: DC | PRN
  Administered 2011-10-22: 3 mg via INTRAVENOUS

## 2011-10-22 MED ORDER — NAPHAZOLINE HCL 0.1 % OP SOLN: 1.0000 [drp] | Freq: Four times a day (QID) | OPHTHALMIC | Status: DC | PRN

## 2011-10-22 MED ORDER — HETASTARCH-ELECTROLYTES 6 % IV SOLN
INTRAVENOUS | Status: DC | PRN
  Administered 2011-10-22: 14:00:00 via INTRAVENOUS

## 2011-10-22 MED ORDER — FLEET ENEMA 7-19 GM/118ML RE ENEM: 1.0000 | ENEMA | Freq: Once | RECTAL | Status: AC | PRN

## 2011-10-22 MED ORDER — FENTANYL CITRATE 0.05 MG/ML IJ SOLN
INTRAMUSCULAR | Status: DC | PRN
  Administered 2011-10-22: 50 ug via INTRAVENOUS
  Administered 2011-10-22: 25 ug via INTRAVENOUS
  Administered 2011-10-22: 100 ug via INTRAVENOUS
  Administered 2011-10-22: 25 ug via INTRAVENOUS
  Administered 2011-10-22: 100 ug via INTRAVENOUS

## 2011-10-22 MED ORDER — METOPROLOL-HCTZ ER 50-12.5 MG PO TB24: 1.0000 | ORAL_TABLET | Freq: Every day | ORAL | Status: DC

## 2011-10-22 MED ORDER — HYDROMORPHONE HCL PF 1 MG/ML IJ SOLN
0.2500 mg | INTRAMUSCULAR | Status: DC | PRN
  Administered 2011-10-22: 0.5 mg via INTRAVENOUS
  Administered 2011-10-22 (×2): 0.25 mg via INTRAVENOUS
  Administered 2011-10-22 (×2): 0.5 mg via INTRAVENOUS

## 2011-10-22 MED ORDER — CEFAZOLIN SODIUM-DEXTROSE 2-3 GM-% IV SOLR: 2.0000 g | Freq: Once | INTRAVENOUS | Status: DC

## 2011-10-22 MED ORDER — PROMETHAZINE HCL 25 MG/ML IJ SOLN: 6.2500 mg | INTRAMUSCULAR | Status: DC | PRN

## 2011-10-22 MED ORDER — MORPHINE SULFATE 10 MG/ML IJ SOLN
1.0000 mg | INTRAMUSCULAR | Status: DC | PRN
  Administered 2011-10-22: 2 mg via INTRAVENOUS
  Filled 2011-10-22: qty 1

## 2011-10-22 MED ORDER — TEMAZEPAM 15 MG PO CAPS: 15.0000 mg | ORAL_CAPSULE | Freq: Every evening | ORAL | Status: DC | PRN

## 2011-10-22 MED ORDER — POLYETHYLENE GLYCOL 3350 17 G PO PACK
17.0000 g | PACK | Freq: Every day | ORAL | Status: DC | PRN
  Filled 2011-10-22: qty 1

## 2011-10-22 SURGICAL SUPPLY — 53 items
BAG SPEC THK2 15X12 ZIP CLS (MISCELLANEOUS) ×1
BAG ZIPLOCK 12X15 (MISCELLANEOUS) ×2 IMPLANT
BIT DRILL 2.8X128 (BIT) ×2 IMPLANT
BLADE EXTENDED COATED 6.5IN (ELECTRODE) ×2 IMPLANT
BLADE SAW SAG 73X25 THK (BLADE) ×1
BLADE SAW SGTL 73X25 THK (BLADE) ×1 IMPLANT
CLOTH BEACON ORANGE TIMEOUT ST (SAFETY) ×2 IMPLANT
CUP ACET PINNACLE SECTR 50MM (Hips) IMPLANT
DECANTER SPIKE VIAL GLASS SM (MISCELLANEOUS) ×2 IMPLANT
DRAPE INCISE IOBAN 66X45 STRL (DRAPES) ×2 IMPLANT
DRAPE ORTHO SPLIT 77X108 STRL (DRAPES) ×4
DRAPE POUCH INSTRU U-SHP 10X18 (DRAPES) ×2 IMPLANT
DRAPE SURG ORHT 6 SPLT 77X108 (DRAPES) ×2 IMPLANT
DRAPE U-SHAPE 47X51 STRL (DRAPES) ×2 IMPLANT
DRSG ADAPTIC 3X8 NADH LF (GAUZE/BANDAGES/DRESSINGS) ×2 IMPLANT
DRSG MEPILEX BORDER 4X4 (GAUZE/BANDAGES/DRESSINGS) ×2 IMPLANT
DRSG MEPILEX BORDER 4X8 (GAUZE/BANDAGES/DRESSINGS) ×2 IMPLANT
DURAPREP 26ML APPLICATOR (WOUND CARE) ×2 IMPLANT
ELECT REM PT RETURN 9FT ADLT (ELECTROSURGICAL) ×2
ELECTRODE REM PT RTRN 9FT ADLT (ELECTROSURGICAL) ×1 IMPLANT
EVACUATOR 1/8 PVC DRAIN (DRAIN) ×2 IMPLANT
FACESHIELD LNG OPTICON STERILE (SAFETY) ×8 IMPLANT
GLOVE BIO SURGEON STRL SZ7.5 (GLOVE) ×2 IMPLANT
GLOVE BIO SURGEON STRL SZ8 (GLOVE) ×2 IMPLANT
GLOVE BIOGEL PI IND STRL 8 (GLOVE) ×2 IMPLANT
GLOVE BIOGEL PI INDICATOR 8 (GLOVE) ×2
GOWN STRL NON-REIN LRG LVL3 (GOWN DISPOSABLE) ×2 IMPLANT
GOWN STRL REIN XL XLG (GOWN DISPOSABLE) ×2 IMPLANT
IMMOBILIZER KNEE 20 (SOFTGOODS) ×2
IMMOBILIZER KNEE 20 THIGH 36 (SOFTGOODS) IMPLANT
KIT BASIN OR (CUSTOM PROCEDURE TRAY) ×2 IMPLANT
MANIFOLD NEPTUNE II (INSTRUMENTS) ×2 IMPLANT
NDL SAFETY ECLIPSE 18X1.5 (NEEDLE) ×1 IMPLANT
NEEDLE HYPO 18GX1.5 SHARP (NEEDLE) ×2
NS IRRIG 1000ML POUR BTL (IV SOLUTION) ×2 IMPLANT
PACK TOTAL JOINT (CUSTOM PROCEDURE TRAY) ×2 IMPLANT
PASSER SUT SWANSON 36MM LOOP (INSTRUMENTS) ×2 IMPLANT
PINNACLE SECTOR CUP 50MM (Hips) IMPLANT
POSITIONER SURGICAL ARM (MISCELLANEOUS) ×2 IMPLANT
SPONGE GAUZE 4X4 12PLY (GAUZE/BANDAGES/DRESSINGS) ×2 IMPLANT
STRIP CLOSURE SKIN 1/2X4 (GAUZE/BANDAGES/DRESSINGS) ×4 IMPLANT
SUT ETHIBOND NAB CT1 #1 30IN (SUTURE) ×4 IMPLANT
SUT MNCRL AB 4-0 PS2 18 (SUTURE) ×2 IMPLANT
SUT VIC AB 1 CT1 27 (SUTURE)
SUT VIC AB 1 CT1 27XBRD ANTBC (SUTURE) ×3 IMPLANT
SUT VIC AB 2-0 CT1 27 (SUTURE) ×6
SUT VIC AB 2-0 CT1 TAPERPNT 27 (SUTURE) ×3 IMPLANT
SUT VLOC 180 0 24IN GS25 (SUTURE) ×1 IMPLANT
SYR 50ML LL SCALE MARK (SYRINGE) ×2 IMPLANT
TOWEL OR 17X26 10 PK STRL BLUE (TOWEL DISPOSABLE) ×4 IMPLANT
TOWEL OR NON WOVEN STRL DISP B (DISPOSABLE) ×2 IMPLANT
TRAY FOLEY CATH 14FRSI W/METER (CATHETERS) ×2 IMPLANT
WATER STERILE IRR 1500ML POUR (IV SOLUTION) ×2 IMPLANT

## 2011-10-22 NOTE — Transfer of Care (Signed)
Immediate Anesthesia Transfer of Care Note  Patient: Marissa Weber  Procedure(s) Performed: Procedure(s) (LRB): TOTAL HIP ARTHROPLASTY (Right)  Patient Location: PACU  Anesthesia Type: General  Level of Consciousness: awake  Airway & Oxygen Therapy: Patient Spontanous Breathing and Patient connected to face mask oxygen  Post-op Assessment: Report given to PACU RN and Post -op Vital signs reviewed and stable  Post vital signs: Reviewed and stable  Complications: No apparent anesthesia complications

## 2011-10-22 NOTE — Interval H&P Note (Signed)
History and Physical Interval Note:  10/22/2011 1:33 PM  [name] Marissa Weber  has presented today for surgery, with the diagnosis of osteoarthritis right hip  The various methods of treatment have been discussed with the patient and family. After consideration of risks, benefits and other options for treatment, the patient has consented to  Procedure(s) (LRB): TOTAL HIP ARTHROPLASTY (Right) as a surgical intervention .  The patients' history has been reviewed, patient examined, no change in status, stable for surgery.  I have reviewed the patients' chart and labs.  Questions were answered to the patient's satisfaction.     Loanne Drilling

## 2011-10-22 NOTE — Anesthesia Preprocedure Evaluation (Signed)
Anesthesia Evaluation  Patient identified by MRN, date of birth, ID band Patient awake    Reviewed: Allergy & Precautions, H&P , NPO status , Patient's Chart, lab work & pertinent test results, reviewed documented beta blocker date and time   Airway Mallampati: II TM Distance: >3 FB Neck ROM: Full    Dental  (+) Partial Upper and Dental Advisory Given   Pulmonary neg pulmonary ROS,  clear to auscultation        Cardiovascular hypertension, Pt. on medications Regular Normal Denies cardiac symptoms   Neuro/Psych Negative Neurological ROS  Negative Psych ROS   GI/Hepatic negative GI ROS, Neg liver ROS,   Endo/Other  Morbid obesity  Renal/GU negative Renal ROS  Genitourinary negative   Musculoskeletal negative musculoskeletal ROS (+)   Abdominal   Peds negative pediatric ROS (+)  Hematology Anemia Hgb 10.7   Anesthesia Other Findings   Reproductive/Obstetrics negative OB ROS                           Anesthesia Physical Anesthesia Plan  ASA: II  Anesthesia Plan: General   Post-op Pain Management:    Induction: Intravenous  Airway Management Planned: Oral ETT  Additional Equipment:   Intra-op Plan:   Post-operative Plan: Extubation in OR  Informed Consent: I have reviewed the patients History and Physical, chart, labs and discussed the procedure including the risks, benefits and alternatives for the proposed anesthesia with the patient or authorized representative who has indicated his/her understanding and acceptance.   Dental advisory given  Plan Discussed with: CRNA and Surgeon  Anesthesia Plan Comments:         Anesthesia Quick Evaluation

## 2011-10-22 NOTE — Plan of Care (Signed)
Problem: Consults Goal: Diagnosis- Total Joint Replacement Primary Total Hip     

## 2011-10-22 NOTE — Op Note (Signed)
Pre-operative diagnosis- Osteoarthritis Right hip  Post-operative diagnosis- Osteoarthritis  Right hip  Procedure-  RightTotal Hip Arthroplasty  Surgeon- Gus Rankin. Jaedyn Marrufo, MD  Assistant- Avel Peace, PA-C   Anesthesia  General  EBL- 650   Drain Hemovac   Complication- None  Condition-PACU - hemodynamically stable.   Brief Clinical Note-  Marissa Weber is a 51 y.o. female with end stage arthritis of her right hip with progressively worsening pain and dysfunction. Pain occurs with activity and rest including pain at night. She has tried analgesics, protected weight bearing and rest without benefit. Pain is too severe to attempt physical therapy. Radiographs demonstrate bone on bone arthritis with subchondral cyst formation and near ankylosis of the hip joint. She presents now for right THA.  Procedure in detail-   The patient is brought into the operating room and placed on the operating table. After successful administration of General  anesthesia, the patient is placed in the  Left lateral decubitus position with the  Right side up and held in place with the hip positioner. The lower extremity is isolated from the perineum with plastic drapes and time-out is performed by the surgical team. The lower extremity is then prepped and draped in the usual sterile fashion. A short posterolateral incision is made with a ten blade through the subcutaneous tissue to the level of the fascia lata which is incised in line with the skin incision. The sciatic nerve is palpated and protected and the short external rotators and capsule are isolated from the femur. The hip is then dislocated and the center of the femoral head is marked. A trial prosthesis is placed such that the trial head corresponds to the center of the patients' native femoral head. The resection level is marked on the femoral neck and the resection is made with an oscillating saw. The femoral head is removed and femoral retractors placed  to gain access to the femoral canal.      The canal finder is passed into the femoral canal and the canal is thoroughly irrigated with sterile saline to remove the fatty contents. Axial reaming is performed to 13.5  mm, proximal reaming to 18D  and the sleeve machined to a small. A 18D small trial sleeve is placed into the proximal femur.      The femur is then retracted anteriorly to gain acetabular exposure. Acetabular retractors are placed and the labrum and osteophytes are removed, Acetabular reaming is performed to 49  mm and a 50  mm Pinnacle acetabular shell is placed in anatomic position with excellent purchase. Additional dome screws were not needed. An apex hole eliminator is placed and the permanent 32 mm neutral + 4 Marathon liner is placed into the acetabular shell.      The trial femur is then placed into the femoral canal. The size is 18 x 13  stem with a 36 + 8  neck and a 32 + 0 head with the neck version 10 degrees beyond  the patients' native anteversion. The hip is reduced with excellent stability with full extension and full external rotation, 70 degrees flexion with 40 degrees adduction and 90 degrees internal rotation and 90 degrees of flexion with 70 degrees of internal rotation. The operative leg is placed on top of the non-operative leg and the leg lengths are found to be equal. The trials are then removed and the permanent implant of the same size is impacted into the femoral canal. The ceramic femoral head of the same  size as the trial is placed and the hip is reduced with the same stability parameters. The operative leg is again placed on top of the non-operative leg and the leg lengths are found to be equal.      The wound is then copiously irrigated with saline solution and the capsule and short external rotators are re-attached to the femur through drill holes with Ethibond suture. The fascia lata is closed over a hemovac drain with #1 vicryl suture and the fascia lata, gluteal  muscles and subcutaneous tissues are injected with Exparel 20ml diluted with saline 50ml. The subcutaneous tissues are closed with #1 and2-0 vicryl and the subcuticular layer closed with running 4-0 Monocryl. The drain is hooked to suction, incision cleaned and dried, and steri-srips and a bulky sterile dressing applied. The limb is placed into a knee immobilizer and the patient is awakened and transported to recovery in stable condition.      Please note that a surgical assistant was a medical necessity for this procedure in order to perform it in a safe and expeditious manner. The assistant was necessary to provide retraction to the vital neurovascular structures and to retract and position the limb to allow for anatomic placement of the prosthetic components.  Gus Rankin Reinhart Saulters, MD    10/22/2011, 3:05 PM

## 2011-10-22 NOTE — H&P (View-Only) (Signed)
Marissa Weber  DOB: [birth year] Single / Language: [place] / Race: Black or African American / Female  Date of Admission:  10/22/2011  Chief Complaint:  Right Hip Pain  History of Present Illness The patient is a 51 year old female who comes in today for a preoperative History and Physical. The patient is scheduled for a right total hip arthroplasty to be performed by Dr. Gus Rankin. Aluisio, MD at [facility] on 10/22/2011. The patient is a 51 year old female who presents with a right hip problem. The patient was seen for a second opinion.The patient reports right hip problems including pain, grinding and catching symptoms that have been present for 1 year. The symptoms began without any known injury. Avana states the right hip is hurting her at all times. It hurts during the day and at night. It is limiting what she can and cannot do. She had a lumbar fusion by Dr. Gerlene Fee, which helped her back pain, but the hip pain is overwhelming now. It is in the groin radiating her to knee. She is not having recurrence of numbness or tingling with this. She cannot walk without the use of assistive devices. The hip does keep her awake at night. She is ready to proceed with surgery. They have been treated conservatively in the past for the above stated problem and despite conservative measures, they continue to have progressive pain and severe functional limitations and dysfunction. They have failed non-operative management. It is felt that they would benefit from undergoing total joint replacement. Risks and benefits of the procedure have been discussed with the patient and they elect to proceed with surgery. There are no active contraindications to surgery such as ongoing infection or rapidly progressive neurological disease.  Allergies No Known Drug Allergies  Medication History Norco 5-325MG    Problem List/Past Medical Osteoarthritis, Hip (715.35) Asthma  Past Surgical  History Ankle Surgery. left Spinal Surgery  Family History Cancer. grandfather fathers side Diabetes Mellitus. mother Hypertension. father Rheumatoid Arthritis. mother  Social History Alcohol use. current drinker; drinks beer; only occasionally per week Children. 5 Current work status. disabled Exercise. Exercises never Illicit drug use. no Living situation. live with partner Marital status. single Number of flights of stairs before winded. 1 Tobacco / smoke exposure. yes Tobacco use. current every day smoker Pregnant. no  Review of Systems  General:Not Present- Chills, Fever, Night Sweats, Fatigue, Weight Gain, Weight Loss and Memory Loss. Skin:Not Present- Hives, Itching, Rash, Eczema and Lesions. HEENT:Not Present- Tinnitus, Headache, Double Vision, Visual Loss, Hearing Loss and Dentures. Respiratory:Not Present- Shortness of breath with exertion, Shortness of breath at rest, Allergies, Coughing up blood and Chronic Cough. Cardiovascular:Not Present- Chest Pain, Racing/skipping heartbeats, Difficulty Breathing Lying Down, Murmur, Swelling and Palpitations. Gastrointestinal:Not Present- Bloody Stool, Heartburn, Abdominal Pain, Vomiting, Nausea, Constipation, Diarrhea, Difficulty Swallowing, Jaundice and Loss of appetitie. Female Genitourinary:Not Present- Blood in Urine, Urinary frequency, Weak urinary stream, Discharge, Flank Pain, Incontinence, Painful Urination, Urgency, Urinary Retention and Urinating at Night. Musculoskeletal:Present- Joint Pain. Not Present- Muscle Weakness, Muscle Pain, Joint Swelling, Back Pain, Morning Stiffness and Spasms. Neurological:Not Present- Tremor, Dizziness, Blackout spells, Paralysis, Difficulty with balance and Weakness. Psychiatric:Not Present- Insomnia.   Vitals Weight: 190 lb Height: 59 in Body Surface Area: 1.89 m Body Mass Index: 38.37 kg/m Pulse: 72 (Regular) Resp.: 16 (Unlabored) BP:  148/92 (Sitting, Right Arm, Standard)    Physical Exam Patient is a 51 year old female with continued hip pain.  General Mental Status - Alert,  cooperative and good historian. General Appearance- pleasant. Not in acute distress. Orientation- Oriented X3. Build & Nutrition- Well nourished and Well developed.  Head and Neck Head- normocephalic, atraumatic . Neck Global Assessment- supple. no bruit auscultated on the right and no bruit auscultated on the left.  ENT Pupil- Bilateral- Regular and Round. Note: wears glasses for reading and driving. Motion- Bilateral- EOMI. upper denture plate  Chest and Lung Exam Auscultation: Breath sounds:- clear at anterior chest wall and - clear at posterior chest wall. Adventitious sounds:- No Adventitious sounds.  Cardiovascular Auscultation:Rhythm- Regular rate and rhythm. Heart Sounds- S1 WNL and S2 WNL. Murmurs & Other Heart Sounds:Auscultation of the heart reveals - No Murmurs.  Abdomen Palpation/Percussion:Tenderness- Abdomen is non-tender to palpation. Rigidity (guarding)- Abdomen is soft. Auscultation:Auscultation of the abdomen reveals - Bowel sounds normal.  Female Genitourinary Not done, not pertinent to present illness  Musculoskeletal On physical exam well developed female, alert and oriented in no apparent distress. Evaluation of her left hip, normal range of motion with no discomfort. Right hip flexion 90, no internal or external rotation, and no abduction. Gait pattern is severely antalgic. Pulses are intact distally with intact strength.  RADIOGRAPHS: AP pelvis and lateral of the right hip show a near ankylosed hip. She is bone on bone throughout with large osteophyte formation. There is absolutely no joint space left. The left hip looks normal.  Assessment & Plan Osteoarthritis Right Hip  Patient is for a Right Total Hip Arthroplasty by Dr. Lequita Halt.  Plan is to look into  SNF/Rehab.  Avel Peace, PA-C

## 2011-10-22 NOTE — Anesthesia Postprocedure Evaluation (Signed)
Anesthesia Post Note  Patient: Marissa Weber  Procedure(s) Performed: Procedure(s) (LRB): TOTAL HIP ARTHROPLASTY (Right)  Anesthesia type: General  Patient location: PACU  Post pain: Pain level controlled  Post assessment: Post-op Vital signs reviewed  Last Vitals:  Filed Vitals:   10/22/11 1545  BP: 163/86  Pulse:   Temp: 36.3 C  Resp:     Post vital signs: Reviewed  Level of consciousness: sedated  Complications: No apparent anesthesia complications

## 2011-10-23 LAB — BASIC METABOLIC PANEL
Calcium: 8.7 mg/dL (ref 8.4–10.5)
GFR calc Af Amer: >90 mL/min (ref >90)
GFR calc non Af Amer: >90 mL/min (ref >90)
Glucose, Bld: 133 mg/dL — ABNORMAL HIGH (ref 70–99)
Potassium: 4 mEq/L (ref 3.5–5.1)
Sodium: 133 mEq/L — ABNORMAL LOW (ref 135–145)

## 2011-10-23 LAB — CBC
Hemoglobin: 8.5 g/dL — ABNORMAL LOW (ref 12.0–15.0)
MCH: 21 pg — ABNORMAL LOW (ref 26.0–34.0)
MCHC: 30.8 g/dL (ref 30.0–36.0)
Platelets: 322 10*3/uL (ref 150–400)
RBC: 4.05 MIL/uL (ref 3.87–5.11)

## 2011-10-23 MED ORDER — POLYSACCHARIDE IRON COMPLEX 150 MG PO CAPS
150.0000 mg | ORAL_CAPSULE | Freq: Every day | ORAL | Status: DC
  Administered 2011-10-23 – 2011-10-25 (×3): 150 mg via ORAL
  Filled 2011-10-23 (×4): qty 1

## 2011-10-23 NOTE — Progress Notes (Signed)
Subjective: 1 Day Post-Op Procedure(s) (LRB): TOTAL HIP ARTHROPLASTY (Right) Patient reports pain as mild.   Patient seen in rounds with Dr. Lequita Halt. Patient has complaints of soreness but better We will start therapy today. Plan is to go SNF after hospital stay.  Objective: Vital signs in last 24 hours: Temp:  [97.3 F (36.3 C)-98.2 F (36.8 C)] 97.8 F (36.6 C) (02/26 0630) Pulse Rate:  [69-103] 81  (02/26 0630) Resp:  [14-20] 16  (02/26 0630) BP: (111-184)/(60-109) 122/77 mmHg (02/26 0630) SpO2:  [91 %-100 %] 98 % (02/26 0630) Weight:  [82.625 kg (182 lb 2.5 oz)-82.626 kg (182 lb 2.5 oz)] 82.626 kg (182 lb 2.5 oz) (02/25 2046)  Intake/Output from previous day:  Intake/Output Summary (Last 24 hours) at 10/23/11 0747 Last data filed at 10/23/11 0600  Gross per 24 hour  Intake 4636.25 ml  Output   4025 ml  Net 611.25 ml    Intake/Output this shift:    Labs:  Basename 10/23/11 0418 10/22/11 1155  HGB 8.5* 10.7*    Basename 10/23/11 0418 10/22/11 1155  WBC 15.7* 7.1  RBC 4.05 5.04  HCT 27.6* 33.8*  PLT 322 314    Basename 10/23/11 0418  NA 133*  K 4.0  CL 99  CO2 27  BUN 4*  CREATININE 0.56  GLUCOSE 133*  CALCIUM 8.7   No results found for this basename: LABPT:2,INR:2 in the last 72 hours  Exam - Neurovascular intact Sensation intact distally Dressing - clean, dry, no drainage Motor function intact - moving foot and toes well on exam.  Hemovac came out already without difficulty.  Past Medical History  Diagnosis Date  . Hypertension   . Asthma     hx of years ago   . Blood transfusion     hx of years ago   . Arthritis     hip  and knees     Assessment/Plan: 1 Day Post-Op Procedure(s) (LRB): TOTAL HIP ARTHROPLASTY (Right) Principal Problem:  *Osteoarthritis of hip   Advance diet Up with therapy Continue foley due to strict I&O and urinary output monitoring Discharge to SNF HGB 8.5 but started low, monitor for symptoms  DVT  Prophylaxis - Xarelto Protocol Partial-Weight Bearing 25-50% right Leg D/C Knee Immobilizer Hemovac Pulled Begin Therapy Hip Preacutions Keep foley until tomorrow. No vaccines.  Patrica Duel 10/23/2011, 7:47 AM

## 2011-10-23 NOTE — Progress Notes (Signed)
CSW met with pt to assist with d/c planning. PT has recommended ST SNF placement . Pt is agreeable with plan but does not think she will be able to afford to contribute her income check for SNF placement . Medicaid will assist with SNF payment. Pt is expected to contribute towards SNF placement with her income check. CSW has initiated SNF search and will provide bed offers when received. Will request medicaid prior auth. CSW will follow to assist with d/c planning.

## 2011-10-23 NOTE — Progress Notes (Signed)
Physical Therapy Treatment Patient Details Name: Marissa Weber MRN: [id number] DOB: 12-29-1960 Today's Date: 10/23/2011  PT Assessment/Plan  PT - Assessment/Plan Comments on Treatment Session: Pt presents with increased pain during pm session.  Pt declined second ambulation, however, agreed to perform exercises.   PT Plan: Discharge plan remains appropriate PT Frequency: 7X/week Recommendations for Other Services: OT consult Follow Up Recommendations: Skilled nursing facility Equipment Recommended: Defer to next venue PT Goals  Acute Rehab PT Goals PT Goal Formulation: With patient Time For Goal Achievement: 7 days Pt will go Supine/Side to Sit: with supervision PT Goal: Supine/Side to Sit - Progress: Goal set today Pt will go Sit to Supine/Side: with supervision PT Goal: Sit to Supine/Side - Progress: Goal set today Pt will go Sit to Stand: with supervision PT Goal: Sit to Stand - Progress: Goal set today Pt will go Stand to Sit: with supervision PT Goal: Stand to Sit - Progress: Goal set today Pt will Ambulate: with supervision;>150 feet;with least restrictive assistive device PT Goal: Ambulate - Progress: Goal set today Pt will Perform Home Exercise Program: with supervision, verbal cues required/provided PT Goal: Perform Home Exercise Program - Progress: Progressing toward goal  PT Treatment Precautions/Restrictions  Precautions Precautions: Posterior Hip Required Braces or Orthoses: Yes Knee Immobilizer: On when out of bed or walking Restrictions Weight Bearing Restrictions: Yes RLE Weight Bearing: Partial weight bearing RLE Partial Weight Bearing Percentage or Pounds: 25-50% Mobility (including Balance)   Exercise  Total Joint Exercises Ankle Circles/Pumps: AROM;Both;20 reps;Supine Quad Sets: AROM;10 reps;Supine;Both Short Arc Quad: AROM;Right;10 reps;Supine Heel Slides: AROM;Right;10 reps;Supine Hip ABduction/ADduction: AAROM;Right;10 reps;Supine End of  Session PT - End of Session Equipment Utilized During Treatment: Gait belt;Right knee immobilizer Activity Tolerance: Patient limited by pain;Patient tolerated treatment well Patient left: in bed;with call bell in reach Nurse Communication: Mobility status for transfers;Mobility status for ambulation General Behavior During Session: [facility] for tasks performed Cognition: [facility] for tasks performed  Page, Meribeth Mattes 10/23/2011, 2:18 PM

## 2011-10-23 NOTE — Evaluation (Signed)
Physical Therapy Evaluation Patient Details Name: Marissa Weber MRN: [id number] DOB: January 29, 1961 Today's Date: 10/23/2011  Problem List:  Patient Active Problem List  Diagnoses  . Osteoarthritis of hip    Past Medical History:  Past Medical History  Diagnosis Date  . Hypertension   . Asthma     hx of years ago   . Blood transfusion     hx of years ago   . Arthritis     hip  and knees    Past Surgical History:  Past Surgical History  Procedure Date  . Cesarean section   . Back surgery   . Ankle surgery     PT Assessment/Plan/Recommendation PT Assessment Clinical Impression Statement: Pt presents s/p R THR (post approach) POD 1 with decreased strength, ROM and mobility.  Pt tolerated ambulation well, however with slight impulsivity with cues provided for safety and with increased pain.  Pt will benefit from skilled PT in acute venue to address deficits.  PT recommends short term SNF at D/C for follow up therapy in order to decrease burden of care and increase pt safety.     PT Recommendation/Assessment: Patient will need skilled PT in the acute care venue PT Problem List: Decreased strength;Decreased range of motion;Decreased activity tolerance;Decreased balance;Decreased mobility;Decreased safety awareness;Decreased knowledge of use of DME;Pain;Decreased knowledge of precautions Barriers to Discharge: Decreased caregiver support PT Therapy Diagnosis : Difficulty walking;Abnormality of gait;Generalized weakness;Acute pain PT Plan PT Frequency: 7X/week PT Treatment/Interventions: DME instruction;Gait training;Functional mobility training;Therapeutic activities;Therapeutic exercise;Patient/family education PT Recommendation Recommendations for Other Services: OT consult Follow Up Recommendations: Skilled nursing facility Equipment Recommended: Defer to next venue PT Goals  Acute Rehab PT Goals PT Goal Formulation: With patient Time For Goal Achievement: 7 days Pt will go  Supine/Side to Sit: with supervision PT Goal: Supine/Side to Sit - Progress: Goal set today Pt will go Sit to Supine/Side: with supervision PT Goal: Sit to Supine/Side - Progress: Goal set today Pt will go Sit to Stand: with supervision PT Goal: Sit to Stand - Progress: Goal set today Pt will go Stand to Sit: with supervision PT Goal: Stand to Sit - Progress: Goal set today Pt will Ambulate: with supervision;>150 feet;with least restrictive assistive device PT Goal: Ambulate - Progress: Goal set today Pt will Perform Home Exercise Program: with supervision, verbal cues required/provided PT Goal: Perform Home Exercise Program - Progress: Goal set today  PT Evaluation Precautions/Restrictions  Precautions Precautions: Posterior Hip Required Braces or Orthoses: Yes Knee Immobilizer: On when out of bed or walking Restrictions Weight Bearing Restrictions: Yes RLE Weight Bearing: Partial weight bearing RLE Partial Weight Bearing Percentage or Pounds: 25-50% Prior Functioning  Home Living Lives With: Significant other Type of Home: Apartment Home Layout: One level Home Access: Stairs to enter Entrance Stairs-Rails: Can reach both Entrance Stairs-Number of Steps: 3 Home Adaptive Equipment: Walker - rolling;Wheelchair - manual Prior Function Level of Independence: Independent with basic ADLs;Independent with gait;Requires assistive device for independence Driving: Yes Vocation: On disability Cognition Cognition Arousal/Alertness: Awake/alert Overall Cognitive Status: Appears within functional limits for tasks assessed Orientation Level: Oriented X4 Sensation/Coordination Sensation Light Touch: Appears Intact Coordination Gross Motor Movements are Fluid and Coordinated: Yes Extremity Assessment RLE Assessment RLE Assessment: Exceptions to [facility] RLE Strength RLE Overall Strength Comments: ankle motions are WFL, unable to test knee/hip due to increased pain and WB precautions.    LLE Assessment LLE Assessment: Exceptions to [facility] LLE Strength LLE Overall Strength Comments: Grossly WFL per functional assessment.  Mobility (including  Balance) Bed Mobility Bed Mobility: Yes Supine to Sit: 4: Min assist Supine to Sit Details (indicate cue type and reason): Min assist for R LE with cues for hand placement on bed to self assist.  Transfers Transfers: Yes Sit to Stand: 1: +2 Total assist;Patient percentage (comment);With upper extremity assist;From elevated surface;From bed Sit to Stand Details (indicate cue type and reason): Pt assist 75%.  +2 for safety with cues for hand placement and LLE to maintain hip precautions.  Pt demos slightly impulsivity with mobility. Cues for safety.  Stand to Sit: 1: +2 Total assist;Patient percentage (comment);With upper extremity assist;With armrests;To chair/3-in-1 Stand to Sit Details: Pt assist 75%.  +2 for safety with cues for hand placement and LLE management to maintain hip precautions.  Ambulation/Gait Ambulation/Gait: Yes Ambulation/Gait Assistance: 1: +2 Total assist;Patient percentage (comment) Ambulation/Gait Assistance Details (indicate cue type and reason): Pt assist 80%.  +2 for safety and line management.  cues for slower gait pattern for safety, also cues for sequencing/technique with RW and to maintain WB status.  Ambulation Distance (Feet): 90 Feet Assistive device: Rolling walker Gait Pattern: Step-to pattern;Trunk flexed;Decreased stance time - left;Decreased step length - right Stairs: No Wheelchair Mobility Wheelchair Mobility: No    Exercise    End of Session PT - End of Session Equipment Utilized During Treatment: Gait belt;Right knee immobilizer Activity Tolerance: Patient limited by pain;Patient tolerated treatment well Patient left: in chair;with call bell in reach Nurse Communication: Mobility status for transfers;Mobility status for ambulation General Behavior During Session: [facility] for tasks  performed Cognition: [facility] for tasks performed  Page, Meribeth Mattes 10/23/2011, 11:45 AM

## 2011-10-23 NOTE — Plan of Care (Signed)
Problem: Consults Goal: Diagnosis- Total Joint Replacement Outcome: Completed/Met Date Met:  10/23/11 Primary Total Hip Right

## 2011-10-24 LAB — CBC
HCT: 26.8 % — ABNORMAL LOW (ref 36.0–46.0)
Hemoglobin: 8.2 g/dL — ABNORMAL LOW (ref 12.0–15.0)
MCHC: 30.6 g/dL (ref 30.0–36.0)
MCV: 68.5 fL — ABNORMAL LOW (ref 78.0–100.0)

## 2011-10-24 LAB — BASIC METABOLIC PANEL
BUN: 5 mg/dL — ABNORMAL LOW (ref 6–23)
Chloride: 97 mEq/L (ref 96–112)
Creatinine, Ser: 0.59 mg/dL (ref 0.50–1.10)
GFR calc non Af Amer: >90 mL/min (ref >90)
Glucose, Bld: 111 mg/dL — ABNORMAL HIGH (ref 70–99)
Potassium: 3.7 mEq/L (ref 3.5–5.1)

## 2011-10-24 NOTE — Progress Notes (Signed)
CARE MANAGEMENT NOTE 10/24/2011  Patient:  Marissa Weber, Marissa Weber   Account Number:  [id number]  Date Initiated:  10/24/2011  Documentation initiated by:  Colleen Can  Subjective/Objective Assessment:   dx osteoarthritis right hip; total hip replacemnt     Action/Plan:   CM spoke with patient; Pt plans to go home with daughter and boyfriend as caregivers. States she will need commode seat but already has RW. States unable to afford SNF rehab.   Anticipated DC Date:  10/25/2011   Anticipated DC Plan:  HOME W HOME HEALTH SERVICES  In-house referral  Clinical Social Worker      DC Planning Services  CM consult      [facility] Choice  HOME HEALTH   Choice offered to / List presented to:  C-1 Patient   DME arranged  NA      DME agency  NA     HH arranged  HH-2 PT      [facility] agency  [facility]   Status of service:  Completed, signed off Medicare Important Message given?  NO (If response is "NO", the following Medicare IM given date fields will be blank) Date Medicare IM given:   Date Additional Medicare IM given:    Discharge Disposition:    Per UR Regulation:    Comments:  10/24/2011 Colleen Can, RN [facility] PT was pre-arranged with [facility] for hh services upon discharge. Genevieve Norlander will also arrange for DME needs. List of HH agencies placed on shadow chart.

## 2011-10-24 NOTE — Progress Notes (Signed)
CSW has been assisting with d/c planning. Pt state she is unable to financially assist with ST SNF placement and will return home. RNCM is assisting with [facility] services. CSW is available if plan changes and pt wants to reconsider SNF placement.

## 2011-10-24 NOTE — Progress Notes (Signed)
Subjective: 2 Days Post-Op Procedure(s) (LRB): TOTAL HIP ARTHROPLASTY (Right) Patient reports pain as mild.   Patient has complaints of soreness but doing OK.  Objective: Vital signs in last 24 hours: Temp:  [98 F (36.7 C)-100.1 F (37.8 C)] 100.1 F (37.8 C) (02/27 1403) Pulse Rate:  [83-92] 92  (02/27 1403) Resp:  [16-18] 18  (02/27 1403) BP: (94-134)/(59-80) 94/59 mmHg (02/27 1403) SpO2:  [97 %-99 %] 97 % (02/27 1403)  Intake/Output from previous day:  Intake/Output Summary (Last 24 hours) at 10/24/11 1708 Last data filed at 10/24/11 1650  Gross per 24 hour  Intake 1723.41 ml  Output   4925 ml  Net -3201.59 ml    Intake/Output this shift: Total I/O In: 930 [P.O.:720; I.V.:210] Out: 1900 [Urine:1900]  Labs:  [facility] 10/24/11 0355 10/23/11 0418 10/22/11 1155  HGB 8.2* 8.5* 10.7*    Basename 10/24/11 0355 10/23/11 0418  WBC 16.8* 15.7*  RBC 3.91 4.05  HCT 26.8* 27.6*  PLT 212 322    Basename 10/24/11 0355 10/23/11 0418  NA 132* 133*  K 3.7 4.0  CL 97 99  CO2 29 27  BUN 5* 4*  CREATININE 0.59 0.56  GLUCOSE 111* 133*  CALCIUM 8.7 8.7   No results found for this basename: LABPT:2,INR:2 in the last 72 hours  Exam - Neurovascular intact Sensation intact distally Dressing/Incision - clean, dry, no drainage Motor function intact - moving foot and toes well on exam.   Past Medical History  Diagnosis Date  . Hypertension   . Asthma     hx of years ago   . Blood transfusion     hx of years ago   . Arthritis     hip  and knees     Assessment/Plan: 2 Days Post-Op Procedure(s) (LRB): TOTAL HIP ARTHROPLASTY (Right) Principal Problem:  *Osteoarthritis of hip   Advance diet Up with therapy Discharge home with home health  DVT Prophylaxis - Xarelto  Protocol Partial-Weight Bearing 25-50% right Leg  [place] 10/24/2011, 5:08 PM

## 2011-10-24 NOTE — Progress Notes (Signed)
Physical Therapy Treatment Patient Details Name: Marissa Weber MRN: [id number] DOB: September 14, 1960 Today's Date: 10/24/2011  PT Assessment/Plan  PT - Assessment/Plan Comments on Treatment Session: Pt continues to progress well, however requires increased cuing for maintaining hip precautions.  PT Plan: Discharge plan needs to be updated PT Frequency: 7X/week Recommendations for Other Services: OT consult Follow Up Recommendations: Home health PT Equipment Recommended: 3 in 1 bedside comode PT Goals  Acute Rehab PT Goals PT Goal Formulation: With patient Time For Goal Achievement: 7 days Pt will go Supine/Side to Sit: with supervision PT Goal: Supine/Side to Sit - Progress: Progressing toward goal Pt will go Sit to Supine/Side: with modified independence PT Goal: Sit to Supine/Side - Progress: Updated due to goal met Pt will go Sit to Stand: with supervision PT Goal: Sit to Stand - Progress: Progressing toward goal Pt will go Stand to Sit: with supervision PT Goal: Stand to Sit - Progress: Progressing toward goal Pt will Ambulate: with supervision;>150 feet;with least restrictive assistive device PT Goal: Ambulate - Progress: Progressing toward goal  PT Treatment Precautions/Restrictions  Precautions Precautions: Posterior Hip Required Braces or Orthoses: Yes Knee Immobilizer:  (DC KI per MD orders) Restrictions Weight Bearing Restrictions: Yes RLE Weight Bearing: Partial weight bearing RLE Partial Weight Bearing Percentage or Pounds: 20-50% Mobility (including Balance) Bed Mobility Bed Mobility: Yes Supine to Sit: 4: Min assist;With rails Supine to Sit Details (indicate cue type and reason): Assist for RLE off of bed with cues for hand placement to self assist instead of using rails.  Sit to Supine: 5: Supervision Sit to Supine - Details (indicate cue type and reason): Supervision for safety with cues for hand placement.  Transfers Transfers: Yes Sit to Stand: 4: Min  assist;From elevated surface;With upper extremity assist;From bed Sit to Stand Details (indicate cue type and reason): Min/guard for safety with continuous cuing for maintaining hip precautions.  Stand to Sit: 4: Min assist;With upper extremity assist;To elevated surface;To bed Stand to Sit Details: Min/guard for safety with cues for hand placement and RLE management.  Ambulation/Gait Ambulation/Gait: Yes Ambulation/Gait Assistance: 4: Min assist Ambulation/Gait Assistance Details (indicate cue type and reason): Min/guard for safety with cues for safety and upright/relaxed posture.  Ambulation Distance (Feet): 100 Feet Assistive device: Rolling walker Gait Pattern: Step-to pattern;Trunk flexed;Decreased stance time - left;Decreased step length - right Gait velocity: improving with cues for safety    Exercise    End of Session PT - End of Session Activity Tolerance: Patient tolerated treatment well Patient left: in bed;with call bell in reach General Behavior During Session: [facility] for tasks performed Cognition: [facility] for tasks performed  Page, Meribeth Mattes 10/24/2011, 4:09 PM

## 2011-10-24 NOTE — Progress Notes (Signed)
Physical Therapy Treatment Patient Details Name: SHANDIE BERTZ MRN: [id number] DOB: 30-Nov-1960 Today's Date: 10/24/2011  PT Assessment/Plan  PT - Assessment/Plan Comments on Treatment Session: Pt progressing well with ambulation and exercises.  continues to have increased pain with activity.  Pt to D/C home due to financial situation required for SNF placement.  PT Plan: Discharge plan needs to be updated PT Frequency: 7X/week Follow Up Recommendations: Home health PT Equipment Recommended: 3 in 1 bedside comode PT Goals  Acute Rehab PT Goals PT Goal Formulation: With patient Time For Goal Achievement: 7 days Pt will go Sit to Stand: with supervision PT Goal: Sit to Stand - Progress: Progressing toward goal Pt will go Stand to Sit: with supervision PT Goal: Stand to Sit - Progress: Progressing toward goal Pt will Ambulate: with supervision;>150 feet;with least restrictive assistive device PT Goal: Ambulate - Progress: Progressing toward goal Pt will Perform Home Exercise Program: with supervision, verbal cues required/provided PT Goal: Perform Home Exercise Program - Progress: Progressing toward goal  PT Treatment Precautions/Restrictions  Precautions Precautions: Posterior Hip Required Braces or Orthoses: Yes Knee Immobilizer: On when out of bed or walking Restrictions Weight Bearing Restrictions: Yes RLE Weight Bearing: Partial weight bearing RLE Partial Weight Bearing Percentage or Pounds: 20-50% Mobility (including Balance) Bed Mobility Bed Mobility: No Supine to Sit: 4: Min assist;With rails;HOB elevated (Comment degrees) Transfers Transfers: Yes Sit to Stand: 4: Min assist;With upper extremity assist;With armrests;From chair/3-in-1 Sit to Stand Details (indicate cue type and reason): Requires continuous cues for maintaining hip precautions with tendency to IR leg when standing.  cues for hand placement.  Stand to Sit: 4: Min assist;With upper extremity assist;With  armrests;To chair/3-in-1 Stand to Sit Details: Requires cues for hand placement and for LLE management to maintain hip precautions. Ambulation/Gait Ambulation/Gait: Yes Ambulation/Gait Assistance: 4: Min assist Ambulation/Gait Assistance Details (indicate cue type and reason): Min/guard for safety with cues for sequencing/technique with RW due to tendency to let RW get too far ahead of her.  Cues for relaxed posture.  Ambulation Distance (Feet): 90 Feet Assistive device: Rolling walker Gait Pattern: Step-to pattern;Trunk flexed;Decreased stance time - left;Decreased step length - right Gait velocity: improving with cues for safety Stairs: No Wheelchair Mobility Wheelchair Mobility: No    Exercise  Total Joint Exercises Ankle Circles/Pumps: AROM;Both;20 reps;Seated Quad Sets: AROM;10 reps;Both;Seated Short Arc Quad: AROM;Right;10 reps;Seated Heel Slides: AROM;Right;10 reps;Seated Hip ABduction/ADduction: AAROM;Right;10 reps;Seated End of Session PT - End of Session Equipment Utilized During Treatment: Right knee immobilizer Activity Tolerance: Patient limited by pain Patient left: in chair;with call bell in reach Nurse Communication: Mobility status for transfers;Mobility status for ambulation General Behavior During Session: [facility] for tasks performed Cognition: [facility] for tasks performed  Page, Meribeth Mattes 10/24/2011, 10:30 AM

## 2011-10-24 NOTE — Evaluation (Signed)
Occupational Therapy Evaluation Patient Details Name: Marissa Weber MRN: [id number] DOB: 1960/10/13 Today's Date: 10/24/2011  Problem List:  Patient Active Problem List  Diagnoses  . Osteoarthritis of hip    Past Medical History:  Past Medical History  Diagnosis Date  . Hypertension   . Asthma     hx of years ago   . Blood transfusion     hx of years ago   . Arthritis     hip  and knees    Past Surgical History:  Past Surgical History  Procedure Date  . Cesarean section   . Back surgery   . Ankle surgery     OT Assessment/Plan/Recommendation OT Assessment Clinical Impression Statement: Pt is a 51 yo female who presents POD#2 TKR. Skilled OT recommended to maximize I w/BADLs in prep for safe d/c home with HHOT. Pt declining STSNF. OT Recommendation/Assessment: Patient will need skilled OT in the acute care venue OT Problem List: Decreased activity tolerance;Decreased safety awareness;Decreased knowledge of precautions;Decreased knowledge of use of DME or AE OT Therapy Diagnosis : Generalized weakness OT Plan OT Frequency: Min 2X/week OT Treatment/Interventions: Self-care/ADL training;Therapeutic activities;DME and/or AE instruction;Patient/family education OT Recommendation Follow Up Recommendations: Home health OT Equipment Recommended: 3 in 1 bedside comode Individuals Consulted Consulted and Agree with Results and Recommendations: Patient OT Goals Acute Rehab OT Goals OT Goal Formulation: With patient Time For Goal Achievement: 2 weeks ADL Goals Pt Will Perform Grooming: with supervision;Standing at sink ADL Goal: Grooming - Progress: Goal set today Pt Will Perform Lower Body Bathing: with supervision;Sit to stand from chair;Sit to stand from bed;with adaptive equipment ADL Goal: Lower Body Bathing - Progress: Goal set today Pt Will Perform Lower Body Dressing: with supervision;Sit to stand from bed;Sit to stand from chair;with adaptive equipment ADL Goal: Lower  Body Dressing - Progress: Goal set today Pt Will Transfer to Toilet: with supervision;Ambulation;3-in-1;Maintaining hip precautions ADL Goal: Toilet Transfer - Progress: Goal set today Pt Will Perform Toileting - Clothing Manipulation: with supervision;Standing ADL Goal: Toileting - Clothing Manipulation - Progress: Goal set today Pt Will Perform Toileting - Hygiene: with supervision;Sit to stand from 3-in-1/toilet ADL Goal: Toileting - Hygiene - Progress: Goal set today  OT Evaluation Precautions/Restrictions  Precautions Precautions: Posterior Hip Required Braces or Orthoses: Yes Knee Immobilizer: On when out of bed or walking Restrictions Weight Bearing Restrictions: Yes RLE Weight Bearing: Partial weight bearing RLE Partial Weight Bearing Percentage or Pounds: 20-50% Prior Functioning Home Living Lives With: Significant other Receives Help From: Family;Friend(s) Type of Home: Apartment Home Layout: One level Home Access: Stairs to enter Entrance Stairs-Rails: Can reach both Entrance Stairs-Number of Steps: 3 Bathroom Shower/Tub: Health visitor: Standard Home Adaptive Equipment: Walker - rolling;Wheelchair - manual Prior Function Level of Independence: Independent with basic ADLs;Requires assistive device for independence;Independent with transfers;Independent with gait Driving: Yes Vocation: On disability ADL ADL Grooming: Performed;Wash/dry face;Wash/dry hands;Set up Where Assessed - Grooming: Sitting, chair;Unsupported Upper Body Bathing: Performed;Set up Where Assessed - Upper Body Bathing: Sitting, chair;Unsupported Lower Body Bathing: Performed;Minimal assistance Lower Body Bathing Details (indicate cue type and reason): A needed for lower legs and feet. Pt states she has long handled sponge at home. Where Assessed - Lower Body Bathing: Sit to stand from chair Upper Body Dressing: Performed;Set up Where Assessed - Upper Body Dressing: Sitting,  chair;Unsupported Lower Body Dressing: Performed;Minimal assistance Lower Body Dressing Details (indicate cue type and reason): Pt educated in the use of AE for LB ADLs (reacher, sock aid).  Pt states she has a shoe horn at home. Where Assessed - Lower Body Dressing: Sit to stand from chair Toilet Transfer: Simulated;Minimal assistance Toilet Transfer Details (indicate cue type and reason): cues for technique, hand placement. Toilet Transfer Method: Stand pivot Toileting - Clothing Manipulation: Simulated;Minimal assistance Where Assessed - Glass blower/designer Manipulation: Standing Toileting - Hygiene: Simulated;Minimal assistance Where Assessed - Toileting Hygiene: Standing Tub/Shower Transfer: Not assessed Tub/Shower Transfer Method: Not assessed Equipment Used: Reacher;Sock aid;Rolling walker Ambulation Related to ADLs: Pt fatigues easily ADL Comments: Pt able to recall 2/3 hip precautions. Vision/Perception    Cognition Cognition Arousal/Alertness: Awake/alert Overall Cognitive Status: Appears within functional limits for tasks assessed Orientation Level: Oriented X4 Sensation/Coordination   Extremity Assessment RUE Assessment RUE Assessment: Within Functional Limits LUE Assessment LUE Assessment: Within Functional Limits Mobility  Bed Mobility Supine to Sit: 4: Min assist;With rails;HOB elevated (Comment degrees) Transfers Sit to Stand: 4: Min assist;From elevated surface;From bed;With upper extremity assist Sit to Stand Details (indicate cue type and reason): cues for hand placement, maintaining WB status. Stand to Sit: To chair/3-in-1;4: Min assist;With upper extremity assist;With armrests Stand to Sit Details: cues for hand placement. Exercises   End of Session OT - End of Session Equipment Utilized During Treatment: Gait belt Activity Tolerance: Patient tolerated treatment well Patient left: in chair;with call bell in reach General Behavior During Session: [facility]  for tasks performed Cognition: [facility] for tasks performed   Nicole Defino A, OTR/L [phone] 10/24/2011, 10:16 AM

## 2011-10-24 NOTE — Progress Notes (Signed)
Nurse Tech made Nurse aware that while emptying the patients trash this morning she found a empty can of beer (Ice[employer]). The patient was educated in regards to the inability to Consume Alcoholic Beverages in the hospital & the harm/dangers of taking pain medication & alcohol comsumption. The patient stated, "My son had that beer when he came to visit me yesterday". The Charge Nurse was made aware & stated to make sure to pass on in report to the Incoming Nurse. Will continue to monitor the patient.  Bennetta Laos, RN

## 2011-10-25 ENCOUNTER — Encounter (HOSPITAL_COMMUNITY): Payer: Self-pay | Admitting: Orthopedic Surgery

## 2011-10-25 DIAGNOSIS — D62 Acute posthemorrhagic anemia: Secondary | ICD-10-CM | POA: Diagnosis not present

## 2011-10-25 DIAGNOSIS — E871 Hypo-osmolality and hyponatremia: Secondary | ICD-10-CM | POA: Diagnosis not present

## 2011-10-25 LAB — CBC
HCT: 25.6 % — ABNORMAL LOW (ref 36.0–46.0)
Hemoglobin: 8 g/dL — ABNORMAL LOW (ref 12.0–15.0)
MCH: 21.2 pg — ABNORMAL LOW (ref 26.0–34.0)
MCHC: 31.3 g/dL (ref 30.0–36.0)
RDW: 21.4 % — ABNORMAL HIGH (ref 11.5–15.5)

## 2011-10-25 MED ORDER — POLYSACCHARIDE IRON COMPLEX 150 MG PO CAPS: 150.0000 mg | ORAL_CAPSULE | Freq: Every day | ORAL | Status: DC

## 2011-10-25 MED ORDER — BISACODYL 10 MG RE SUPP
10.0000 mg | Freq: Once | RECTAL | Status: AC
  Administered 2011-10-25: 10 mg via RECTAL

## 2011-10-25 MED ORDER — METHOCARBAMOL 500 MG PO TABS: 500.0000 mg | ORAL_TABLET | Freq: Four times a day (QID) | ORAL | Status: AC | PRN

## 2011-10-25 MED ORDER — OXYCODONE HCL 5 MG PO TABS: 5.0000 mg | ORAL_TABLET | ORAL | Status: AC | PRN

## 2011-10-25 MED ORDER — RIVAROXABAN 10 MG PO TABS: 10.0000 mg | ORAL_TABLET | Freq: Every day | ORAL | Status: DC

## 2011-10-25 NOTE — Progress Notes (Signed)
CARE MANAGEMENT NOTE 10/25/2011  Patient:  DEENA, SHAUB   Account Number:  [id number]  Date Initiated:  10/24/2011  Documentation initiated by:  Colleen Can  Subjective/Objective Assessment:   dx osteoarthritis right hip; total hip replacemnt     Action/Plan:   CM spoke with patient; Pt plans to go home with daughter and boyfriend as caregivers. States she will need commode seat but already has RW. States unable to afford SNF rehab.   Anticipated DC Date:  10/25/2011   Anticipated DC Plan:  HOME W HOME HEALTH SERVICES  In-house referral  Clinical Social Worker      DC Planning Services  CM consult      [facility] Choice  HOME HEALTH   Choice offered to / List presented to:  C-1 Patient   DME arranged  NA      DME agency  NA     HH arranged  HH-2 PT      [facility] agency  [facility]   Status of service:  Completed, signed off Medicare Important Message given?  NO (If response is "NO", the following Medicare IM given date fields will be blank) Date Medicare IM given:   Date Additional Medicare IM given:    Discharge Disposition:  HOME W HOME HEALTH SERVICES  Per UR Regulation:    Comments:  10/25/2011 Raynelle Bring BSN CCM [phone] ORDERS FOR DISCHARGE TODAY. [facility] TO START HHPT TOMORROW 10/26/2011.

## 2011-10-25 NOTE — Progress Notes (Signed)
Subjective: 3 Days Post-Op Procedure(s) (LRB): TOTAL HIP ARTHROPLASTY (Right) Patient reports pain as mild. Patient has complaints of sorness but doing OK.  Ready to go home.  Objective: Vital signs in last 24 hours: Temp:  [98 F (36.7 C)-100.1 F (37.8 C)] 98 F (36.7 C) (02/28 0550) Pulse Rate:  [76-92] 76  (02/28 0550) Resp:  [16-18] 18  (02/28 0550) BP: (91-103)/(58-72) 91/58 mmHg (02/28 0550) SpO2:  [97 %] 97 % (02/28 0550)  Intake/Output from previous day:  Intake/Output Summary (Last 24 hours) at 10/25/11 1056 Last data filed at 10/25/11 0900  Gross per 24 hour  Intake 1423.5 ml  Output   2250 ml  Net -826.5 ml    Intake/Output this shift: Total I/O In: 420 [P.O.:420] Out: -   Labs: Results for orders placed during the hospital encounter of 10/22/11  CBC      Component Value Range   WBC 7.1  4.0 - 10.5 (K/uL)   RBC 5.04  3.87 - 5.11 (MIL/uL)   Hemoglobin 10.7 (*) 12.0 - 15.0 (g/dL)   HCT 16.1 (*) 09.6 - 46.0 (%)   MCV 67.1 (*) 78.0 - 100.0 (fL)   MCH 21.2 (*) 26.0 - 34.0 (pg)   MCHC 31.7  30.0 - 36.0 (g/dL)   RDW 04.5 (*) 40.9 - 15.5 (%)   Platelets 314  150 - 400 (K/uL)  CBC      Component Value Range   WBC 15.7 (*) 4.0 - 10.5 (K/uL)   RBC 4.05  3.87 - 5.11 (MIL/uL)   Hemoglobin 8.5 (*) 12.0 - 15.0 (g/dL)   HCT 81.1 (*) 91.4 - 46.0 (%)   MCV 68.1 (*) 78.0 - 100.0 (fL)   MCH 21.0 (*) 26.0 - 34.0 (pg)   MCHC 30.8  30.0 - 36.0 (g/dL)   RDW 78.2 (*) 95.6 - 15.5 (%)   Platelets 322  150 - 400 (K/uL)  BASIC METABOLIC PANEL      Component Value Range   Sodium 133 (*) 135 - 145 (mEq/L)   Potassium 4.0  3.5 - 5.1 (mEq/L)   Chloride 99  96 - 112 (mEq/L)   CO2 27  19 - 32 (mEq/L)   Glucose, Bld 133 (*) 70 - 99 (mg/dL)   BUN 4 (*) 6 - 23 (mg/dL)   Creatinine, Ser 2.13  0.50 - 1.10 (mg/dL)   Calcium 8.7  8.4 - 08.6 (mg/dL)   GFR calc non Af Amer >90  >90 (mL/min)   GFR calc Af Amer >90  >90 (mL/min)  CBC      Component Value Range   WBC 16.8 (*) 4.0 -  10.5 (K/uL)   RBC 3.91  3.87 - 5.11 (MIL/uL)   Hemoglobin 8.2 (*) 12.0 - 15.0 (g/dL)   HCT 57.8 (*) 46.9 - 46.0 (%)   MCV 68.5 (*) 78.0 - 100.0 (fL)   MCH 21.0 (*) 26.0 - 34.0 (pg)   MCHC 30.6  30.0 - 36.0 (g/dL)   RDW 62.9 (*) 52.8 - 15.5 (%)   Platelets 212  150 - 400 (K/uL)  BASIC METABOLIC PANEL      Component Value Range   Sodium 132 (*) 135 - 145 (mEq/L)   Potassium 3.7  3.5 - 5.1 (mEq/L)   Chloride 97  96 - 112 (mEq/L)   CO2 29  19 - 32 (mEq/L)   Glucose, Bld 111 (*) 70 - 99 (mg/dL)   BUN 5 (*) 6 - 23 (mg/dL)   Creatinine, Ser 4.13  0.50 -  1.10 (mg/dL)   Calcium 8.7  8.4 - 21.3 (mg/dL)   GFR calc non Af Amer >90  >90 (mL/min)   GFR calc Af Amer >90  >90 (mL/min)  CBC      Component Value Range   WBC 17.9 (*) 4.0 - 10.5 (K/uL)   RBC 3.78 (*) 3.87 - 5.11 (MIL/uL)   Hemoglobin 8.0 (*) 12.0 - 15.0 (g/dL)   HCT 08.6 (*) 57.8 - 46.0 (%)   MCV 67.7 (*) 78.0 - 100.0 (fL)   MCH 21.2 (*) 26.0 - 34.0 (pg)   MCHC 31.3  30.0 - 36.0 (g/dL)   RDW 46.9 (*) 62.9 - 15.5 (%)   Platelets 283  150 - 400 (K/uL)    Exam: Neurovascular intact Sensation intact distally Incision - clean, dry, no drainage Motor function intact - moving foot and toes well on exam.   Assessment/Plan: 3 Days Post-Op Procedure(s) (LRB): TOTAL HIP ARTHROPLASTY (Right) Procedure(s) (LRB): TOTAL HIP ARTHROPLASTY (Right) Past Medical History  Diagnosis Date  . Hypertension   . Asthma     hx of years ago   . Blood transfusion     hx of years ago   . Arthritis     hip  and knees    Principal Problem:  *Osteoarthritis of hip Active Problems:  Postop Acute blood loss anemia  Postop Hyponatremia   Advance diet Up with therapy Discharge home with home health Diet - regular Follow up - in 2 weeks Activity - PWB 25-50% Condition Upon Discharge - Good D/C Meds - See DC Summary DVT Prophylaxis - Xarelto Protocol   Junior Kenedy 10/25/2011, 10:56 AM

## 2011-10-25 NOTE — Progress Notes (Signed)
Physical Therapy Treatment Patient Details Name: Marissa Weber MRN: [id number] DOB: August 05, 1961 Today's Date: 10/25/2011  PT Assessment/Plan  PT - Assessment/Plan Comments on Treatment Session: Pt continues to require max cuing to maintain hip precautions and for safety using the stairs.   Pt to D/C today.  PT Plan: Discharge plan needs to be updated PT Frequency: 7X/week Follow Up Recommendations: Home health PT Equipment Recommended: 3 in 1 bedside comode PT Goals  Acute Rehab PT Goals PT Goal Formulation: With patient Time For Goal Achievement: 7 days Pt will go Supine/Side to Sit: with supervision PT Goal: Supine/Side to Sit - Progress: Progressing toward goal Pt will go Sit to Stand: with supervision PT Goal: Sit to Stand - Progress: Met Pt will go Stand to Sit: with supervision PT Goal: Stand to Sit - Progress: Met Pt will Ambulate: with supervision;>150 feet;with least restrictive assistive device PT Goal: Ambulate - Progress: Partly met Pt will Perform Home Exercise Program: with supervision, verbal cues required/provided PT Goal: Perform Home Exercise Program - Progress: Met  PT Treatment Precautions/Restrictions  Precautions Precautions: Posterior Hip Required Braces or Orthoses: Yes Knee Immobilizer: On when out of bed or walking Restrictions Weight Bearing Restrictions: Yes RLE Weight Bearing: Partial weight bearing RLE Partial Weight Bearing Percentage or Pounds: 25-50% Mobility (including Balance) Bed Mobility Bed Mobility: Yes Supine to Sit: 4: Min assist;With rails Supine to Sit Details (indicate cue type and reason): Assist for R LE off of bed, continuous cuing for use UE for self assist instead of hand rails.  Sit to Supine: 5: Supervision Sit to Supine - Details (indicate cue type and reason): Supervision for safety and cues to maintain hip precautions. Transfers Transfers: Yes Sit to Stand: 5: Supervision;From elevated surface;With upper extremity  assist;From bed Sit to Stand Details (indicate cue type and reason): Supervision for safety with cues to maintain hip precautions.  Stand to Sit: 5: Supervision;With upper extremity assist;To elevated surface;To bed Stand to Sit Details: Supervision for safety with cues for hand placement and to max cues to maintain hip precautions.  Ambulation/Gait Ambulation/Gait: Yes Ambulation/Gait Assistance: 5: Supervision Ambulation/Gait Assistance Details (indicate cue type and reason): Supervision for safety due to pt impulsivity.  Ambulation Distance (Feet): 100 Feet Assistive device: Rolling walker Gait Pattern: Step-to pattern;Trunk flexed;Decreased stance time - left;Decreased step length - right Stairs: Yes Stairs Assistance: 4: Min assist Stairs Assistance Details (indicate cue type and reason): Requires max cuing for sequencing/technique with RW.  Stair Management Technique: No rails;Step to pattern;Backwards;Forwards;With walker Number of Stairs: 2  Height of Stairs: 6     Exercise  Total Joint Exercises Ankle Circles/Pumps: AROM;Both;20 reps;Seated Quad Sets: AROM;10 reps;Both;Seated Short Arc Quad: AROM;Right;10 reps;Seated Heel Slides: AROM;Right;10 reps;Seated Hip ABduction/ADduction: AAROM;Right;10 reps;Seated End of Session PT - End of Session Activity Tolerance: Patient tolerated treatment well Patient left: in bed;with call bell in reach General Behavior During Session: [facility] for tasks performed Cognition: [facility] for tasks performed  Page, Meribeth Mattes 10/25/2011, 12:32 PM

## 2011-10-25 NOTE — Discharge Instructions (Signed)
Pick up stool softner and laxative for home. Do not submerge incision under water. May shower. Continue to use ice for pain and swelling from surgery. Hip precautions.  Total Hip Protocol. 

## 2011-10-26 MED FILL — Sodium Chloride Inj 0.9%: INTRAMUSCULAR | Qty: 50 | Status: AC

## 2011-11-01 NOTE — Discharge Summary (Signed)
Physician Discharge Summary   Patient ID: Marissa Weber MRN: [id number] DOB/AGE: [birth year] 51 y.o.  Admit date: 10/22/2011 Discharge date: 10/25/2011    Primary Diagnosis: Osteoarthritis Right Hip   Admission Diagnoses: Past Medical History  Diagnosis Date  . Hypertension   . Asthma     hx of years ago   . Blood transfusion     hx of years ago   . Arthritis     hip  and knees     Discharge Diagnoses:  Principal Problem:  *Osteoarthritis of hip Active Problems:  Postop Acute blood loss anemia  Postop Hyponatremia   Procedure: Procedure(s) (LRB): TOTAL HIP ARTHROPLASTY (Right)   Consults: None  HPI: Marissa Weber is a 51 y.o. female with end stage arthritis of her right hip with progressively worsening pain and dysfunction. Pain occurs with activity and rest including pain at night. She has tried analgesics, protected weight bearing and rest without benefit. Pain is too severe to attempt physical therapy. Radiographs demonstrate bone on bone arthritis with subchondral cyst formation and near ankylosis of the hip joint. She presents now for right THA.  Laboratory Data: Hospital Outpatient Visit on 10/15/2011  Component Date Value Range Status  . aPTT (seconds) 10/15/2011 37  24-37 Final   Comment:                                 IF BASELINE aPTT IS ELEVATED,                          SUGGEST PATIENT RISK ASSESSMENT                          BE USED TO DETERMINE APPROPRIATE                          ANTICOAGULANT THERAPY.  . WBC (K/uL) 10/15/2011 7.2  4.0-10.5 Final   COUNT MAY BE INACCURATE DUE TO FIBRIN CLUMPS.  Marland Kitchen RBC (MIL/uL) 10/15/2011 5.70* 3.87-5.11 Final  . Hemoglobin (g/dL) 40/98/1191 47.8* 29.5-62.1 Final  . HCT (%) 10/15/2011 37.9  36.0-46.0 Final  . MCV (fL) 10/15/2011 66.5* 78.0-100.0 Final  . MCH (pg) 10/15/2011 20.4* 26.0-34.0 Final  . MCHC (g/dL) 30/86/5784 69.6  29.5-28.4 Final  . RDW (%) 10/15/2011 20.6* 11.5-15.5 Final  . Platelets (K/uL)  10/15/2011 PLATELET CLUMPS NOTED ON SMEAR  150-400 Final   SPECIMEN CHECKED FOR CLOTS  . Sodium (mEq/L) 10/15/2011 135  135-145 Final  . Potassium (mEq/L) 10/15/2011 3.4* 3.5-5.1 Final  . Chloride (mEq/L) 10/15/2011 97  96-112 Final  . CO2 (mEq/L) 10/15/2011 27  19-32 Final  . Glucose, Bld (mg/dL) 13/24/4010 90  27-25 Final  . BUN (mg/dL) 36/64/4034 4* 7-42 Final  . Creatinine, Ser (mg/dL) 59/56/3875 6.43  3.29-5.18 Final  . Calcium (mg/dL) 84/16/6063 9.7  0.1-60.1 Final  . Total Protein (g/dL) 09/32/3557 8.2  3.2-2.0 Final  . Albumin (g/dL) 25/42/7062 3.6  3.7-6.2 Final  . AST (U/L) 10/15/2011 43* 0-37 Final  . ALT (U/L) 10/15/2011 24  0-35 Final  . Alkaline Phosphatase (U/L) 10/15/2011 81  39-117 Final  . Total Bilirubin (mg/dL) 83/15/1761 0.5  6.0-7.3 Final  . GFR calc non Af Amer (mL/min) 10/15/2011 >90  >90 Final  . GFR calc Af Amer (mL/min) 10/15/2011 >90  >90 Final   Comment:  The eGFR has been calculated                          using the CKD EPI equation.                          This calculation has not been                          validated in all clinical                          situations.                          eGFR's persistently                          <90 mL/min signify                          possible Chronic Kidney Disease.  Marland Kitchen Prothrombin Time (seconds) 10/15/2011 13.9  11.6-15.2 Final  . INR  10/15/2011 1.05  0.00-1.49 Final  . Color, Urine  10/15/2011 AMBER* YELLOW Final   BIOCHEMICALS MAY BE AFFECTED BY COLOR  . APPearance  10/15/2011 CLOUDY* CLEAR Final  . Specific Gravity, Urine  10/15/2011 1.024  1.005-1.030 Final  . pH  10/15/2011 7.5  5.0-8.0 Final  . Glucose, UA (mg/dL) 81/19/1478 NEGATIVE  NEGATIVE Final  . Hgb urine dipstick  10/15/2011 NEGATIVE  NEGATIVE Final  . Bilirubin Urine  10/15/2011 SMALL* NEGATIVE Final  . Ketones, ur (mg/dL) 29/56/2130 NEGATIVE  NEGATIVE Final  . Protein, ur (mg/dL) 86/57/8469 NEGATIVE   NEGATIVE Final  . Urobilinogen, UA (mg/dL) 62/95/2841 1.0  3.2-4.4 Final  . Nitrite  10/15/2011 NEGATIVE  NEGATIVE Final  . Leukocytes, UA  10/15/2011 TRACE* NEGATIVE Final  . MRSA, PCR  10/15/2011 NEGATIVE  NEGATIVE Final  . Staphylococcus aureus  10/15/2011 NEGATIVE  NEGATIVE Final   Comment:                                 The Xpert SA Assay (FDA                          approved for NASAL specimens                          only), is one component of                          a comprehensive surveillance                          program.  It is not intended                          to diagnose infection nor to                          guide or monitor treatment.  . ABO/RH(D)  10/15/2011 O POS   Final  . Antibody Screen  10/15/2011  NEG   Final  . Sample Expiration  10/15/2011 10/25/2011   Final  . Squamous Epithelial / LPF  10/15/2011 FEW* RARE Final  . WBC, UA (WBC/hpf) 10/15/2011 0-2  <3 Final  . RBC / HPF (RBC/hpf) 10/15/2011 0-2  <3 Final  . Bacteria, UA  10/15/2011 FEW* RARE Final  . Urine-Other  10/15/2011 MUCOUS PRESENT   Final  . ABO/RH(D)  10/15/2011 O POS   Final   No results found for this basename: HGB:5 in the last 72 hours No results found for this basename: WBC:2,RBC:2,HCT:2,PLT:2 in the last 72 hours No results found for this basename: NA:2,K:2,CL:2,CO2:2,BUN:2,CREATININE:2,GLUCOSE:2,CALCIUM:2 in the last 72 hours No results found for this basename: LABPT:2,INR:2 in the last 72 hours  X-Rays:X-ray Hip Right Ap And Lateral  10/22/2011  *RADIOLOGY REPORT*  Clinical Data: Preop for right hip arthroplasty.  RIGHT HIP - COMPLETE 2+ VIEW  Comparison: 04/10/2011.  Findings: Severe degenerative joint disease noted on the right with joint space narrowing, osteophytic spurring, bony eburnation and subchondral cystic change.  The left hip appears normal.  The pubic symphysis and SI joints are intact.  IMPRESSION: Severe degenerative joint disease involving the right hip.   Original Report Authenticated By: P. Loralie Champagne, M.D.   Dg Pelvis Portable  10/22/2011  *RADIOLOGY REPORT*  Clinical Data: Postop right total hip  PORTABLE PELVIS  Comparison: None.  Findings: Right total hip arthroplasty in satisfactory position.  Associated subcutaneous drain.  No fracture or dislocation is seen.  IMPRESSION: Satisfactory appearance status post right total hip arthroplasty.  Original Report Authenticated By: Charline Bills, M.D.   Dg Hip Portable 1 View Right  10/22/2011  *RADIOLOGY REPORT*  Clinical Data: Status post total right hip arthroplasty.  PORTABLE RIGHT HIP - 1 VIEW  Comparison: 10/22/2011  Findings: The that the patient has had right total hip arthroplasty.  Surgical drain overlies the soft tissues of the hip. No evidence for dislocation on this frontal view.  IMPRESSION: Status post right total hip arthroplasty.  No adverse features identified.  Original Report Authenticated By: Patterson Hammersmith, M.D.    EKG: Orders placed during the hospital encounter of 10/15/11  . EKG 12-LEAD  . EKG 12-LEAD     Hospital Course: Patient was admitted to [facility] and taken to the OR and underwent the above state procedure without complications.  Patient tolerated the procedure well and was later transferred to the recovery room and then to the orthopaedic floor for postoperative care.  They were given PO and IV analgesics for pain control following their surgery.  They were given 24 hours of postoperative antibiotics and started on DVT prophylaxis.   PT and OT were ordered for total joint protocol.  Discharge planning consulted to help with postop disposition and equipment needs. Patient initially wanted to look into SNF.  Patient had a decent night on the evening of surgery and started to get up with therapy on day one and walked well.   Hemovac drain was pulled without difficulty.  Continued to progress with therapy into day two even with a little increased pain  from all the therapy the day before.  Dressing was changed on day two and the incision was healing well.  By day three, the patient had progressed with therapy and meeting goals.  Incision was healing well.  Patient was seen in rounds and was ready to go home.  Discharge Medications: Prior to Admission medications   Medication Sig Start Date End Date Taking? Authorizing Provider  amLODipine (NORVASC) 5 MG tablet Take 5 mg by mouth daily. Takes in the morning.   Yes Historical Provider, MD  Metoprolol-Hydrochlorothiazide (DUTOPROL) 50-12.5 MG TB24 Take 1 tablet by mouth daily.   Yes Historical Provider, MD  tetrahydrozoline (MURINE FOR RED EYES) 0.05 % ophthalmic solution Place 1 drop into both eyes as needed. For eye redness.   Yes Historical Provider, MD  iron polysaccharides (NIFEREX) 150 MG capsule Take 1 capsule (150 mg total) by mouth daily. 10/25/11 10/24/12  Haliegh Khurana Julien Girt, PA  methocarbamol (ROBAXIN) 500 MG tablet Take 1 tablet (500 mg total) by mouth every 6 (six) hours as needed. 10/25/11 11/04/11  Arya Boxley, PA  oxyCODONE (OXY IR/ROXICODONE) 5 MG immediate release tablet Take 1-2 tablets (5-10 mg total) by mouth every 3 (three) hours as needed. 10/25/11 11/04/11  Danyia Borunda Julien Girt, PA  rivaroxaban (XARELTO) 10 MG TABS tablet Take 1 tablet (10 mg total) by mouth daily with breakfast. 10/25/11   Braxson Hollingsworth Julien Girt, PA    Diet: regular  Activity:PWB 25-50% No bending hip over 90 degrees- A "L" Angle Do not cross legs Do not let foot roll inward  When turning these patients a pillow should be placed between the patient's legs to prevent crossing.  Patients should have the affected knee fully extended when trying to sit or stand from all surfaces to prevent excessive hip flexion.  When ambulating and turning toward the affected side the affected leg should have the toes turned out prior to moving the walker and the rest of patient's body as to prevent internal rotation/  turning in of the leg.  Abduction pillows are the most effective way to prevent a patient from not crossing legs or turning toes in at rest. If an abduction pillow is not ordered placing a regular pillow length wise between the patient's legs is also an effective reminder.  It is imperative that these precautions be maintained so that the surgical hip does not dislocate.    Follow-up:in 2 weeks  Disposition: home  Discharged Condition: good   Discharge Orders    Future Orders Please Complete By Expires   Diet - low sodium heart healthy      Call MD / Call 911      Comments:   If you experience chest pain or shortness of breath, CALL 911 and be transported to the hospital emergency room.  If you develope a fever above 101 F, pus (white drainage) or increased drainage or redness at the wound, or calf pain, call your surgeon's office.   Constipation Prevention      Comments:   Drink plenty of fluids.  Prune juice may be helpful.  You may use a stool softener, such as Colace (over the counter) 100 mg twice a day.  Use MiraLax (over the counter) for constipation as needed.   Increase activity slowly as tolerated      Weight Bearing as taught in Physical Therapy      Comments:   Use a walker or crutches as instructed.   Discharge instructions      Comments:   Pick up stool softner and laxative for home. Do not submerge incision under water. May shower. Continue to use ice for pain and swelling from surgery. Hip precautions.  Total Hip Protocol.   Driving restrictions      Comments:   No driving   Lifting restrictions      Comments:   No lifting   Follow the hip precautions as taught in Physical Therapy  Change dressing      Comments:   You may change your dressing daily with sterile 4 x 4 inch gauze dressing and paper tape.   TED hose      Comments:   Use stockings (TED hose) for 3 weeks on both leg(s).  You may remove them at night for sleeping.     Medication List   As of 11/01/2011 11:09 AM   STOP taking these medications         HYDROcodone-acetaminophen 5-325 MG per tablet         TAKE these medications         amLODipine 5 MG tablet   Commonly known as: NORVASC   Take 5 mg by mouth daily. Takes in the morning.      DUTOPROL 50-12.5 MG Tb24   Generic drug: Metoprolol-Hydrochlorothiazide   Take 1 tablet by mouth daily.      iron polysaccharides 150 MG capsule   Commonly known as: NIFEREX   Take 1 capsule (150 mg total) by mouth daily.      methocarbamol 500 MG tablet   Commonly known as: ROBAXIN   Take 1 tablet (500 mg total) by mouth every 6 (six) hours as needed.      MURINE FOR RED EYES 0.05 % ophthalmic solution   Generic drug: tetrahydrozoline   Place 1 drop into both eyes as needed. For eye redness.      oxyCODONE 5 MG immediate release tablet   Commonly known as: Oxy IR/ROXICODONE   Take 1-2 tablets (5-10 mg total) by mouth every 3 (three) hours as needed.      rivaroxaban 10 MG Tabs tablet   Commonly known as: XARELTO   Take 1 tablet (10 mg total) by mouth daily with breakfast.           Follow-up Information    Follow up with Loanne Drilling, MD. Schedule an appointment as soon as possible for a visit in 2 weeks. (Follow up 3/12 or 3/14)    Contact information:   [facility] [address], [address] [place] [address] [phone]          Signed: Patrica Duel 11/01/2011, 11:09 AM

## 2012-09-01 ENCOUNTER — Emergency Department (HOSPITAL_COMMUNITY)
Admission: EM | Admit: 2012-09-01 | Discharge: 2012-09-01 | Disposition: A | Discharge status: Home / Self Care | Payer: Medicaid Other | Attending: Emergency Medicine | Admitting: Emergency Medicine

## 2012-09-01 ENCOUNTER — Encounter (HOSPITAL_COMMUNITY): Payer: Self-pay | Admitting: Cardiology

## 2012-09-01 DIAGNOSIS — Z79899 Other long term (current) drug therapy: Secondary | ICD-10-CM | POA: Insufficient documentation

## 2012-09-01 DIAGNOSIS — K089 Disorder of teeth and supporting structures, unspecified: Secondary | ICD-10-CM | POA: Insufficient documentation

## 2012-09-01 DIAGNOSIS — K0889 Other specified disorders of teeth and supporting structures: Secondary | ICD-10-CM

## 2012-09-01 DIAGNOSIS — K029 Dental caries, unspecified: Secondary | ICD-10-CM | POA: Insufficient documentation

## 2012-09-01 DIAGNOSIS — Z8739 Personal history of other diseases of the musculoskeletal system and connective tissue: Secondary | ICD-10-CM | POA: Insufficient documentation

## 2012-09-01 DIAGNOSIS — I10 Essential (primary) hypertension: Secondary | ICD-10-CM | POA: Insufficient documentation

## 2012-09-01 DIAGNOSIS — F17200 Nicotine dependence, unspecified, uncomplicated: Secondary | ICD-10-CM | POA: Insufficient documentation

## 2012-09-01 DIAGNOSIS — Z8709 Personal history of other diseases of the respiratory system: Secondary | ICD-10-CM | POA: Insufficient documentation

## 2012-09-01 MED ORDER — PENICILLIN V POTASSIUM 250 MG PO TABS
500.0000 mg | ORAL_TABLET | Freq: Once | ORAL | Status: AC
  Administered 2012-09-01: 500 mg via ORAL
  Filled 2012-09-01: qty 2

## 2012-09-01 MED ORDER — HYDROCODONE-ACETAMINOPHEN 5-325 MG PO TABS: 2.0000 | ORAL_TABLET | ORAL | Status: DC | PRN

## 2012-09-01 MED ORDER — PENICILLIN V POTASSIUM 500 MG PO TABS: 500.0000 mg | ORAL_TABLET | Freq: Three times a day (TID) | ORAL | Status: DC

## 2012-09-01 MED ORDER — OXYCODONE-ACETAMINOPHEN 5-325 MG PO TABS
1.0000 | ORAL_TABLET | Freq: Once | ORAL | Status: AC
  Administered 2012-09-01: 1 via ORAL
  Filled 2012-09-01: qty 1

## 2012-09-01 NOTE — ED Notes (Signed)
Pt ambulatory leaving ED by self. Pt states that she is not driving and states daughter will be picking her up from the ED. Pt given d/c teaching and prescriptions. Pt has no further questions upon d/c.

## 2012-09-01 NOTE — ED Notes (Signed)
Fayrene Helper, PA notified about pts BP and verified pt stable for d/c.

## 2012-09-01 NOTE — ED Notes (Signed)
Pt reports she has 3 teeth that need to be pulled on the right lower side of her jaw. States she has been having pain for the past 2 days.

## 2012-09-01 NOTE — ED Provider Notes (Signed)
Medical screening examination/treatment/procedure(s) were performed by non-physician practitioner and as supervising physician I was immediately available for consultation/collaboration.  Neidy Guerrieri T Ximenna Fonseca, MD 09/01/12 2316 

## 2012-09-01 NOTE — ED Provider Notes (Signed)
History     CSN: [id number]  Arrival date & time 09/01/12  1710   First MD Initiated Contact with Patient 09/01/12 2027      Chief Complaint  Patient presents with  . Dental Pain    (Consider location/radiation/quality/duration/timing/severity/associated sxs/prior treatment) HPI  52 year old female presents complaining of dental pain. Patient reports she has pain to several of her teeth on her right lower jaw for the past 2 days. Onset was gradual,persistent, nonradiating, moderate in severity, worsening with hot or cold water or air.  She tries taking home over the counter medication including orajel, and ibuprofen without relief.  She denies fever, chills, neck pain, throat swelling, facial swelling. She denies any recent trauma, has not had pain to same teeth before. Patient is a smoker.  Past Medical History  Diagnosis Date  . Hypertension   . Asthma     hx of years ago   . Blood transfusion     hx of years ago   . Arthritis     hip  and knees     Past Surgical History  Procedure Date  . Cesarean section   . Back surgery   . Ankle surgery   . Total hip arthroplasty 10/22/2011    Procedure: TOTAL HIP ARTHROPLASTY;  Surgeon: Loanne Drilling, MD;  Location: [facility];  Service: Orthopedics;  Laterality: Right;    History reviewed. No pertinent family history.  History  Substance Use Topics  . Smoking status: Current Every Day Smoker -- 0.5 packs/day for 10 years  . Smokeless tobacco: Never Used  . Alcohol Use: Yes     Comment: occ beer and wine    OB History    Grav Para Term Preterm Abortions TAB SAB Ect Mult Living   4 4 4       5       Review of Systems  Constitutional: Negative for fever and chills.  HENT: Positive for dental problem. Negative for sore throat.   Skin: Negative for rash.  Neurological: Negative for headaches.    Allergies  Review of patient's allergies indicates no known allergies.  Home Medications   Current Outpatient Rx  Name   Route  Sig  Dispense  Refill  . AMLODIPINE BESYLATE 5 MG PO TABS   Oral   Take 5 mg by mouth daily. Takes in the morning.         Marland Kitchen POLYSACCHARIDE IRON COMPLEX 150 MG PO CAPS   Oral   Take 1 capsule (150 mg total) by mouth daily.   21 capsule   0   . METOPROLOL-HCTZ ER 50-12.5 MG PO TB24   Oral   Take 1 tablet by mouth daily.         Marland Kitchen RIVAROXABAN 10 MG PO TABS   Oral   Take 1 tablet (10 mg total) by mouth daily with breakfast.   18 tablet   0   . TETRAHYDROZOLINE HCL 0.05 % OP SOLN   Both Eyes   Place 1 drop into both eyes as needed. For eye redness.           BP 156/104  Pulse 102  Temp 98.4 F (36.9 C) (Oral)  Resp 18  SpO2 98%  Physical Exam  Nursing note and vitals reviewed. Constitutional: She is oriented to person, place, and time. She appears well-developed and well-nourished. She appears distressed (tearful, holding right lower jaw with hand).  HENT:  Head: Normocephalic and atraumatic.  Right Ear: External ear normal.  Left Ear: External ear normal.  Mouth/Throat: Uvula is midline and oropharynx is clear and moist. No oropharyngeal exudate.    Eyes: Conjunctivae normal are normal.  Neck: Neck supple.  Lymphadenopathy:    She has no cervical adenopathy.  Neurological: She is alert and oriented to person, place, and time.  Skin: Skin is warm. No rash noted.  Psychiatric: She has a normal mood and affect.    ED Course  Procedures (including critical care time)  Labs Reviewed - No data to display No results found.   No diagnosis found.  1. Dental pain  MDM  Pt with dental decay and dental pain.  No obvious abscess amenable for I&D.  Will give abx, pain medication and dental referral.    BP 156/104  Pulse 102  Temp 98.4 F (36.9 C) (Oral)  Resp 18  SpO2 98%        Fayrene Helper, PA-C 09/01/12 2055

## 2013-10-06 ENCOUNTER — Emergency Department (HOSPITAL_COMMUNITY): Payer: Medicaid Other

## 2013-10-06 ENCOUNTER — Encounter (HOSPITAL_COMMUNITY): Payer: Self-pay | Admitting: Emergency Medicine

## 2013-10-06 ENCOUNTER — Emergency Department (HOSPITAL_COMMUNITY)
Admission: EM | Admit: 2013-10-06 | Discharge: 2013-10-06 | Disposition: A | Discharge status: Home / Self Care | Payer: Medicaid Other | Attending: Emergency Medicine | Admitting: Emergency Medicine

## 2013-10-06 DIAGNOSIS — Z79899 Other long term (current) drug therapy: Secondary | ICD-10-CM | POA: Insufficient documentation

## 2013-10-06 DIAGNOSIS — J45909 Unspecified asthma, uncomplicated: Secondary | ICD-10-CM | POA: Insufficient documentation

## 2013-10-06 DIAGNOSIS — M1710 Unilateral primary osteoarthritis, unspecified knee: Secondary | ICD-10-CM | POA: Insufficient documentation

## 2013-10-06 DIAGNOSIS — IMO0002 Reserved for concepts with insufficient information to code with codable children: Secondary | ICD-10-CM | POA: Insufficient documentation

## 2013-10-06 DIAGNOSIS — Z96649 Presence of unspecified artificial hip joint: Secondary | ICD-10-CM | POA: Insufficient documentation

## 2013-10-06 DIAGNOSIS — F17200 Nicotine dependence, unspecified, uncomplicated: Secondary | ICD-10-CM | POA: Insufficient documentation

## 2013-10-06 DIAGNOSIS — M1711 Unilateral primary osteoarthritis, right knee: Secondary | ICD-10-CM

## 2013-10-06 DIAGNOSIS — I10 Essential (primary) hypertension: Secondary | ICD-10-CM | POA: Insufficient documentation

## 2013-10-06 DIAGNOSIS — M1610 Unilateral primary osteoarthritis, unspecified hip: Secondary | ICD-10-CM | POA: Insufficient documentation

## 2013-10-06 MED ORDER — OXYCODONE-ACETAMINOPHEN 5-325 MG PO TABS: 1.0000 | ORAL_TABLET | Freq: Four times a day (QID) | ORAL | Status: DC | PRN

## 2013-10-06 MED ORDER — OXYCODONE-ACETAMINOPHEN 5-325 MG PO TABS
2.0000 | ORAL_TABLET | Freq: Once | ORAL | Status: AC
  Administered 2013-10-06: 2 via ORAL
  Filled 2013-10-06: qty 2

## 2013-10-06 NOTE — ED Notes (Signed)
Presents to ed c/o right knee pain states she walked approx. 3 miles yest uses a walker, states she tried to "pop: her knee normally that will relief pain tonight not helping. States her husband is here in hospital so she thought she would just have her knee checked .

## 2013-10-06 NOTE — ED Provider Notes (Signed)
CSN: [id number]     Arrival date & time 10/06/13  0025 History   First MD Initiated Contact with Patient 10/06/13 0522     Chief Complaint  Patient presents with  . Knee Pain     (Consider location/radiation/quality/duration/timing/severity/associated sxs/prior Treatment) HPI History per patient. Right knee pain worse over the last 2 weeks. Has history of right hip arthroplasty 2013 and is followed by orthopedics. Patient has no arthritis but has never been evaluated for knee problems. She denies any fall or injury. No fevers or chills. She has baseline knee swelling which has not gotten any worse. No rash or redness to her knee. Pain is sharp and called the, moderate severity worse with movement and weightbearing. No associated weakness or numbness. Past Medical History  Diagnosis Date  . Hypertension   . Asthma     hx of years ago   . Blood transfusion     hx of years ago   . Arthritis     hip  and knees    Past Surgical History  Procedure Laterality Date  . Cesarean section    . Back surgery    . Ankle surgery    . Total hip arthroplasty  10/22/2011    Procedure: TOTAL HIP ARTHROPLASTY;  Surgeon: Gearlean Alf, MD;  Location: [facility];  Service: Orthopedics;  Laterality: Right;   No family history on file. History  Substance Use Topics  . Smoking status: Current Every Day Smoker -- 0.50 packs/day for 10 years  . Smokeless tobacco: Never Used  . Alcohol Use: Yes     Comment: occ beer and wine   OB History   Grav Para Term Preterm Abortions TAB SAB Ect Mult Living   4 4 4       5      Review of Systems  Constitutional: Negative for fever and chills.  Respiratory: Negative for shortness of breath.   Cardiovascular: Negative for chest pain.  Gastrointestinal: Negative for abdominal pain.  Genitourinary: Negative for flank pain.  Musculoskeletal: Negative for neck stiffness.  Skin: Negative for rash.  Neurological: Negative for weakness and numbness.  All other systems  reviewed and are negative.      Allergies  Review of patient's allergies indicates no known allergies.  Home Medications   Current Outpatient Rx  Name  Route  Sig  Dispense  Refill  . amLODipine (NORVASC) 5 MG tablet   Oral   Take 5 mg by mouth daily. Takes in the morning.         Marland Kitchen HYDROcodone-acetaminophen (NORCO/VICODIN) 5-325 MG per tablet   Oral   Take 2 tablets by mouth every 4 (four) hours as needed for pain.   10 tablet   0   . penicillin v potassium (VEETID) 500 MG tablet   Oral   Take 1 tablet (500 mg total) by mouth 3 (three) times daily.   30 tablet   0    BP 104/67  Pulse 83  Temp(Src) 97.6 F (36.4 C) (Oral)  Resp 18  SpO2 96% Physical Exam  Constitutional: She is oriented to person, place, and time. She appears well-developed and well-nourished.  HENT:  Head: Normocephalic and atraumatic.  Eyes: EOM are normal. Pupils are equal, round, and reactive to light.  Neck: Neck supple.  Cardiovascular: Regular rhythm and intact distal pulses.   Pulmonary/Chest: Effort normal. No respiratory distress.  Musculoskeletal:  Tenderness over the right knee with mild/moderate effusion. No erythema or increased warmth to touch. Some  decreased range of motion secondary to pain. No tenderness or swelling otherwise. Distal neurovascular intact. Pelvis stable.  Neurological: She is alert and oriented to person, place, and time.  Skin: Skin is warm and dry.    ED Course  Procedures (including critical care time) Labs Review Labs Reviewed - No data to display Imaging Review Dg Knee Complete 4 Views Right  10/06/2013   CLINICAL DATA:  Knee pain, no injury  EXAM: RIGHT KNEE - COMPLETE 4+ VIEW  COMPARISON:  None.  FINDINGS: There is no evidence of fracture, dislocation. There are degenerative joint changes with narrowed joint space and osteophyte formation. Soft tissues are unremarkable.  IMPRESSION: No acute fracture or dislocation. Osteoarthritic changes of right  knee.   Electronically Signed   By: Abelardo Diesel M.D.   On: 10/06/2013 01:37    Ice. Percocet.  Plan discharge home with prescription for Percocet and followup with orthopedics Dr. Maureen Ralphs. She will continue using her walker as needed. Return precautions provided. MDM   Diagnosis: Right knee arthritis  X-ray reviewed as above. She has no symptoms of septic joint otherwise.  Afebrile vital signs reviewed.  Medications provided. Stable and appropriate for discharge home with outpatient follow up    Teressa Lower, MD 10/06/13 [phone]

## 2013-10-06 NOTE — Discharge Instructions (Signed)
Osteoarthritis Osteoarthritis is a disease that causes soreness and swelling (inflammation) of a joint. It occurs when the cartilage at the affected joint wears down. Cartilage acts as a cushion, covering the ends of bones where they meet to form a joint. Osteoarthritis is the most common form of arthritis. It often occurs in older people. The joints affected most often by this condition include those in the:  Ends of the fingers.  Thumbs.  Neck.  Lower back.  Knees.  Hips. CAUSES  Over time, the cartilage that covers the ends of bones begins to wear away. This causes bone to rub on bone, producing pain and stiffness in the affected joints.  RISK FACTORS Certain factors can increase your chances of having osteoarthritis, including:  Older age.  Excessive body weight.  Overuse of joints. SIGNS AND SYMPTOMS   Pain, swelling, and stiffness in the joint.  Over time, the joint may lose its normal shape.  Small deposits of bone (osteophytes) may grow on the edges of the joint.  Bits of bone or cartilage can break off and float inside the joint space. This may cause more pain and damage. DIAGNOSIS  Your health care provider will do a physical exam and ask about your symptoms. Various tests may be ordered, such as:  X-rays of the affected joint.  An MRI scan.  Blood tests to rule out other types of arthritis.  Joint fluid tests. This involves using a needle to draw fluid from the joint and examining the fluid under a microscope. TREATMENT  Goals of treatment are to control pain and improve joint function. Treatment plans may include:  A prescribed exercise program that allows for rest and joint relief.  A weight control plan.  Pain relief techniques, such as:  Properly applied heat and cold.  Electric pulses delivered to nerve endings under the skin (transcutaneous electrical nerve stimulation, TENS).  Massage.  Certain nutritional supplements.  Medicines to  control pain, such as:  Acetaminophen.  Nonsteroidal anti-inflammatory drugs (NSAIDs), such as naproxen.  Narcotic or central-acting agents, such as tramadol.  Corticosteroids. These can be given orally or as an injection.  Surgery to reposition the bones and relieve pain (osteotomy) or to remove loose pieces of bone and cartilage. Joint replacement may be needed in advanced states of osteoarthritis. HOME CARE INSTRUCTIONS   Only take over-the-counter or prescription medicines as directed by your health care provider. Take all medicines exactly as instructed.  Maintain a healthy weight. Follow your health care provider's instructions for weight control. This may include dietary instructions.  Exercise as directed. Your health care provider can recommend specific types of exercise. These may include:  Strengthening exercises These are done to strengthen the muscles that support joints affected by arthritis. They can be performed with weights or with exercise bands to add resistance.  Aerobic activities These are exercises, such as brisk walking or low-impact aerobics, that get your heart pumping.  Range-of-motion activities These keep your joints limber.  Balance and agility exercises These help you maintain daily living skills.  Rest your affected joints as directed by your health care provider.  Follow up with your health care provider as directed. SEEK MEDICAL CARE IF:   Your skin turns red.  You develop a rash in addition to your joint pain.  You have worsening joint pain. SEEK IMMEDIATE MEDICAL CARE IF:  You have a significant loss of weight or appetite.  You have a fever along with joint or muscle aches.  You have   night sweats. FOR MORE INFORMATION  National Institute of Arthritis and Musculoskeletal and Skin Diseases: www.niams.nih.gov National Institute on Aging: www.nia.nih.gov American College of Rheumatology: www.rheumatology.org Document Released: 08/13/2005  Document Revised: 06/03/2013 Document Reviewed: 04/20/2013 [employer] Patient Information 2014 [employer].  

## 2013-10-06 NOTE — ED Notes (Signed)
States her pain is better , she is now able to knee her knee.

## 2013-10-06 NOTE — ED Notes (Signed)
Pt. reports right knee pain " it pooped out" today while trying to get up , pain worse with movement /certain positions .

## 2013-12-11 ENCOUNTER — Observation Stay (HOSPITAL_COMMUNITY)
Admission: EM | Admit: 2013-12-11 | Discharge: 2013-12-12 | Disposition: A | Discharge status: Home / Self Care | Payer: Medicaid Other | Attending: Surgery | Admitting: Surgery

## 2013-12-11 ENCOUNTER — Encounter (HOSPITAL_COMMUNITY): Payer: Self-pay | Admitting: Emergency Medicine

## 2013-12-11 ENCOUNTER — Observation Stay (HOSPITAL_COMMUNITY): Payer: Medicaid Other | Admitting: Anesthesiology

## 2013-12-11 ENCOUNTER — Observation Stay (HOSPITAL_COMMUNITY): Payer: Medicaid Other

## 2013-12-11 ENCOUNTER — Emergency Department (HOSPITAL_COMMUNITY): Payer: Medicaid Other

## 2013-12-11 ENCOUNTER — Encounter (HOSPITAL_COMMUNITY)
Admission: EM | Disposition: A | Discharge status: Home / Self Care | Payer: Self-pay | Source: Home / Self Care | Attending: Emergency Medicine

## 2013-12-11 ENCOUNTER — Encounter (HOSPITAL_COMMUNITY): Payer: Medicaid Other | Admitting: Anesthesiology

## 2013-12-11 DIAGNOSIS — E871 Hypo-osmolality and hyponatremia: Secondary | ICD-10-CM | POA: Insufficient documentation

## 2013-12-11 DIAGNOSIS — K8000 Calculus of gallbladder with acute cholecystitis without obstruction: Principal | ICD-10-CM | POA: Diagnosis present

## 2013-12-11 DIAGNOSIS — R197 Diarrhea, unspecified: Secondary | ICD-10-CM

## 2013-12-11 DIAGNOSIS — E860 Dehydration: Secondary | ICD-10-CM

## 2013-12-11 DIAGNOSIS — Z96649 Presence of unspecified artificial hip joint: Secondary | ICD-10-CM | POA: Insufficient documentation

## 2013-12-11 DIAGNOSIS — R112 Nausea with vomiting, unspecified: Secondary | ICD-10-CM

## 2013-12-11 DIAGNOSIS — K8020 Calculus of gallbladder without cholecystitis without obstruction: Secondary | ICD-10-CM

## 2013-12-11 DIAGNOSIS — F17200 Nicotine dependence, unspecified, uncomplicated: Secondary | ICD-10-CM | POA: Insufficient documentation

## 2013-12-11 DIAGNOSIS — K8010 Calculus of gallbladder with chronic cholecystitis without obstruction: Secondary | ICD-10-CM

## 2013-12-11 DIAGNOSIS — J45909 Unspecified asthma, uncomplicated: Secondary | ICD-10-CM | POA: Insufficient documentation

## 2013-12-11 DIAGNOSIS — I10 Essential (primary) hypertension: Secondary | ICD-10-CM | POA: Insufficient documentation

## 2013-12-11 HISTORY — PX: CHOLECYSTECTOMY: SHX55

## 2013-12-11 HISTORY — DX: Cholecystitis, unspecified: K81.9

## 2013-12-11 LAB — LIPASE, BLOOD: Lipase: 68 U/L — ABNORMAL HIGH (ref 11–59)

## 2013-12-11 LAB — COMPREHENSIVE METABOLIC PANEL
ALT: 16 U/L (ref 0–35)
AST: 25 U/L (ref 0–37)
Albumin: 3.7 g/dL (ref 3.5–5.2)
Alkaline Phosphatase: 95 U/L (ref 39–117)
BUN: 6 mg/dL (ref 6–23)
CALCIUM: 9.6 mg/dL (ref 8.4–10.5)
CO2: 21 mEq/L (ref 19–32)
Chloride: 87 mEq/L — ABNORMAL LOW (ref 96–112)
Creatinine, Ser: 0.7 mg/dL (ref 0.50–1.10)
GFR calc non Af Amer: >90 mL/min (ref >90)
Glucose, Bld: 88 mg/dL (ref 70–99)
Potassium: 3.2 mEq/L — ABNORMAL LOW (ref 3.7–5.3)
Sodium: 128 mEq/L — ABNORMAL LOW (ref 137–147)
TOTAL PROTEIN: 8.3 g/dL (ref 6.0–8.3)
Total Bilirubin: 0.5 mg/dL (ref 0.3–1.2)

## 2013-12-11 LAB — CBC WITH DIFFERENTIAL/PLATELET
BASOS PCT: 0 % (ref 0–1)
Basophils Absolute: 0 10*3/uL (ref 0.0–0.1)
EOS ABS: 0.1 10*3/uL (ref 0.0–0.7)
EOS PCT: 1 % (ref 0–5)
HCT: 38.4 % (ref 36.0–46.0)
HEMOGLOBIN: 12.2 g/dL (ref 12.0–15.0)
LYMPHS ABS: 1.9 10*3/uL (ref 0.7–4.0)
Lymphocytes Relative: 17 % (ref 12–46)
MCH: 21 pg — ABNORMAL LOW (ref 26.0–34.0)
MCHC: 31.8 g/dL (ref 30.0–36.0)
MCV: 66.1 fL — AB (ref 78.0–100.0)
MONO ABS: 1.3 10*3/uL — AB (ref 0.1–1.0)
Monocytes Relative: 12 % (ref 3–12)
NEUTROS PCT: 70 % (ref 43–77)
Neutro Abs: 7.8 10*3/uL — ABNORMAL HIGH (ref 1.7–7.7)
Platelets: 240 10*3/uL (ref 150–400)
RBC: 5.81 MIL/uL — AB (ref 3.87–5.11)
RDW: 22.2 % — ABNORMAL HIGH (ref 11.5–15.5)
WBC: 11.1 10*3/uL — ABNORMAL HIGH (ref 4.0–10.5)

## 2013-12-11 LAB — SURGICAL PCR SCREEN
MRSA, PCR: POSITIVE — AB
STAPHYLOCOCCUS AUREUS: POSITIVE — AB

## 2013-12-11 SURGERY — LAPAROSCOPIC CHOLECYSTECTOMY WITH INTRAOPERATIVE CHOLANGIOGRAM
Anesthesia: General | Site: Abdomen

## 2013-12-11 MED ORDER — PROPOFOL 10 MG/ML IV BOLUS
INTRAVENOUS | Status: AC
  Filled 2013-12-11: qty 20

## 2013-12-11 MED ORDER — HYDROCODONE-ACETAMINOPHEN 5-325 MG PO TABS
1.0000 | ORAL_TABLET | ORAL | Status: DC | PRN
  Administered 2013-12-11 – 2013-12-12 (×4): 2 via ORAL
  Filled 2013-12-11 (×4): qty 2

## 2013-12-11 MED ORDER — HYOSCYAMINE SULFATE 0.125 MG SL SUBL
0.1250 mg | SUBLINGUAL_TABLET | SUBLINGUAL | Status: DC | PRN
  Administered 2013-12-11: 0.125 mg via SUBLINGUAL
  Filled 2013-12-11: qty 1

## 2013-12-11 MED ORDER — HYDROMORPHONE HCL PF 1 MG/ML IJ SOLN
1.0000 mg | INTRAMUSCULAR | Status: DC | PRN
  Administered 2013-12-11 (×2): 1 mg via INTRAVENOUS
  Filled 2013-12-11 (×2): qty 1

## 2013-12-11 MED ORDER — GLYCOPYRROLATE 0.2 MG/ML IJ SOLN
INTRAMUSCULAR | Status: DC | PRN
  Administered 2013-12-11: 0.4 mg via INTRAVENOUS

## 2013-12-11 MED ORDER — MIDAZOLAM HCL 5 MG/5ML IJ SOLN
INTRAMUSCULAR | Status: DC | PRN
  Administered 2013-12-11: 2 mg via INTRAVENOUS

## 2013-12-11 MED ORDER — PROPOFOL 10 MG/ML IV BOLUS
INTRAVENOUS | Status: DC | PRN
  Administered 2013-12-11: 150 mg via INTRAVENOUS

## 2013-12-11 MED ORDER — LIDOCAINE HCL (CARDIAC) 20 MG/ML IV SOLN
INTRAVENOUS | Status: DC | PRN
  Administered 2013-12-11: 100 mg via INTRAVENOUS

## 2013-12-11 MED ORDER — SODIUM CHLORIDE 0.9 % IV SOLN
INTRAVENOUS | Status: DC | PRN
  Administered 2013-12-11: 15:00:00

## 2013-12-11 MED ORDER — PIPERACILLIN-TAZOBACTAM 3.375 G IVPB
3.3750 g | Freq: Three times a day (TID) | INTRAVENOUS | Status: DC
  Administered 2013-12-11 – 2013-12-12 (×5): 3.375 g via INTRAVENOUS
  Filled 2013-12-11 (×7): qty 50

## 2013-12-11 MED ORDER — LACTATED RINGERS IV SOLN
INTRAVENOUS | Status: DC
  Administered 2013-12-11: 13:00:00 via INTRAVENOUS

## 2013-12-11 MED ORDER — MIDAZOLAM HCL 2 MG/2ML IJ SOLN
0.5000 mg | INTRAMUSCULAR | Status: DC | PRN
  Administered 2013-12-11: 0.5 mg via INTRAVENOUS

## 2013-12-11 MED ORDER — LIDOCAINE HCL (CARDIAC) 20 MG/ML IV SOLN
INTRAVENOUS | Status: AC
  Filled 2013-12-11: qty 5

## 2013-12-11 MED ORDER — NEOSTIGMINE METHYLSULFATE 1 MG/ML IJ SOLN
INTRAMUSCULAR | Status: DC | PRN
  Administered 2013-12-11: 3 mg via INTRAVENOUS

## 2013-12-11 MED ORDER — ONDANSETRON HCL 4 MG/2ML IJ SOLN: 4.0000 mg | Freq: Four times a day (QID) | INTRAMUSCULAR | Status: DC | PRN

## 2013-12-11 MED ORDER — HEMOSTATIC AGENTS (NO CHARGE) OPTIME
TOPICAL | Status: DC | PRN
  Administered 2013-12-11: 1 via TOPICAL

## 2013-12-11 MED ORDER — MUPIROCIN 2 % EX OINT
TOPICAL_OINTMENT | Freq: Two times a day (BID) | CUTANEOUS | Status: DC
  Administered 2013-12-11 – 2013-12-12 (×3): via NASAL
  Filled 2013-12-11: qty 22

## 2013-12-11 MED ORDER — BUPIVACAINE-EPINEPHRINE (PF) 0.25% -1:200000 IJ SOLN
INTRAMUSCULAR | Status: AC
  Filled 2013-12-11: qty 30

## 2013-12-11 MED ORDER — WHITE PETROLATUM GEL
Status: AC
  Administered 2013-12-11: 0.2
  Filled 2013-12-11: qty 5

## 2013-12-11 MED ORDER — FENTANYL CITRATE 0.05 MG/ML IJ SOLN
INTRAMUSCULAR | Status: AC
  Filled 2013-12-11: qty 5

## 2013-12-11 MED ORDER — ALBUTEROL SULFATE (2.5 MG/3ML) 0.083% IN NEBU
INHALATION_SOLUTION | RESPIRATORY_TRACT | Status: AC
Start: 2013-12-11 — End: 2013-12-11
  Administered 2013-12-11: 2.5 mg via RESPIRATORY_TRACT
  Filled 2013-12-11: qty 3

## 2013-12-11 MED ORDER — HYDROMORPHONE HCL PF 1 MG/ML IJ SOLN
INTRAMUSCULAR | Status: AC
  Administered 2013-12-11: 0.5 mg via INTRAVENOUS
  Filled 2013-12-11: qty 1

## 2013-12-11 MED ORDER — FENTANYL CITRATE 0.05 MG/ML IJ SOLN
50.0000 ug | Freq: Once | INTRAMUSCULAR | Status: AC
  Administered 2013-12-11: 50 ug via INTRAVENOUS
  Filled 2013-12-11: qty 2

## 2013-12-11 MED ORDER — POTASSIUM CHLORIDE IN NACL 20-0.9 MEQ/L-% IV SOLN
INTRAVENOUS | Status: DC
  Administered 2013-12-11 – 2013-12-12 (×3): via INTRAVENOUS
  Filled 2013-12-11 (×5): qty 1000

## 2013-12-11 MED ORDER — SODIUM CHLORIDE 0.9 % IR SOLN
Status: DC | PRN
  Administered 2013-12-11: 1000 mL

## 2013-12-11 MED ORDER — ROCURONIUM BROMIDE 100 MG/10ML IV SOLN
INTRAVENOUS | Status: DC | PRN
  Administered 2013-12-11: 10 mg via INTRAVENOUS
  Administered 2013-12-11: 30 mg via INTRAVENOUS

## 2013-12-11 MED ORDER — MIDAZOLAM HCL 2 MG/2ML IJ SOLN
INTRAMUSCULAR | Status: AC
  Filled 2013-12-11: qty 2

## 2013-12-11 MED ORDER — HYDROMORPHONE HCL PF 1 MG/ML IJ SOLN: 1.0000 mg | Freq: Once | INTRAMUSCULAR | Status: DC

## 2013-12-11 MED ORDER — MORPHINE SULFATE 4 MG/ML IJ SOLN
4.0000 mg | Freq: Once | INTRAMUSCULAR | Status: AC
  Administered 2013-12-11: 4 mg via INTRAVENOUS
  Filled 2013-12-11: qty 1

## 2013-12-11 MED ORDER — ENOXAPARIN SODIUM 40 MG/0.4ML ~~LOC~~ SOLN
40.0000 mg | Freq: Every day | SUBCUTANEOUS | Status: DC
  Administered 2013-12-12: 40 mg via SUBCUTANEOUS
  Filled 2013-12-11: qty 0.4

## 2013-12-11 MED ORDER — HYDROMORPHONE HCL PF 1 MG/ML IJ SOLN
1.0000 mg | Freq: Once | INTRAMUSCULAR | Status: AC
  Administered 2013-12-11: 1 mg via INTRAVENOUS
  Filled 2013-12-11: qty 1

## 2013-12-11 MED ORDER — FENTANYL CITRATE 0.05 MG/ML IJ SOLN
INTRAMUSCULAR | Status: DC | PRN
  Administered 2013-12-11: 100 ug via INTRAVENOUS
  Administered 2013-12-11: 50 ug via INTRAVENOUS
  Administered 2013-12-11: 100 ug via INTRAVENOUS

## 2013-12-11 MED ORDER — 0.9 % SODIUM CHLORIDE (POUR BTL) OPTIME
TOPICAL | Status: DC | PRN
  Administered 2013-12-11: 1000 mL

## 2013-12-11 MED ORDER — DICYCLOMINE HCL 10 MG/ML IM SOLN
20.0000 mg | Freq: Once | INTRAMUSCULAR | Status: AC
  Administered 2013-12-11: 20 mg via INTRAMUSCULAR
  Filled 2013-12-11: qty 2

## 2013-12-11 MED ORDER — LACTATED RINGERS IV SOLN
INTRAVENOUS | Status: DC | PRN
  Administered 2013-12-11 (×2): via INTRAVENOUS

## 2013-12-11 MED ORDER — HYOSCYAMINE SULFATE 0.125 MG PO TABS
0.1250 mg | ORAL_TABLET | ORAL | Status: DC | PRN
  Filled 2013-12-11: qty 1

## 2013-12-11 MED ORDER — HYDROMORPHONE HCL PF 1 MG/ML IJ SOLN
0.5000 mg | INTRAMUSCULAR | Status: DC | PRN
  Administered 2013-12-11 – 2013-12-12 (×4): 1 mg via INTRAVENOUS
  Filled 2013-12-11 (×4): qty 1

## 2013-12-11 MED ORDER — ONDANSETRON HCL 4 MG/2ML IJ SOLN
INTRAMUSCULAR | Status: DC | PRN
  Administered 2013-12-11: 4 mg via INTRAVENOUS

## 2013-12-11 MED ORDER — ONDANSETRON HCL 4 MG/2ML IJ SOLN
4.0000 mg | Freq: Once | INTRAMUSCULAR | Status: AC
  Administered 2013-12-11: 4 mg via INTRAVENOUS
  Filled 2013-12-11: qty 2

## 2013-12-11 MED ORDER — ROCURONIUM BROMIDE 50 MG/5ML IV SOLN
INTRAVENOUS | Status: AC
  Filled 2013-12-11: qty 1

## 2013-12-11 MED ORDER — ALBUTEROL SULFATE (2.5 MG/3ML) 0.083% IN NEBU
2.5000 mg | INHALATION_SOLUTION | Freq: Once | RESPIRATORY_TRACT | Status: AC
  Administered 2013-12-11: 2.5 mg via RESPIRATORY_TRACT

## 2013-12-11 MED ORDER — BUPIVACAINE-EPINEPHRINE 0.25% -1:200000 IJ SOLN
INTRAMUSCULAR | Status: DC | PRN
  Administered 2013-12-11: 11 mL

## 2013-12-11 MED ORDER — PHENYLEPHRINE HCL 10 MG/ML IJ SOLN
INTRAMUSCULAR | Status: DC | PRN
  Administered 2013-12-11: 120 ug via INTRAVENOUS
  Administered 2013-12-11: 80 ug via INTRAVENOUS

## 2013-12-11 MED ORDER — SODIUM CHLORIDE 0.9 % IV BOLUS (SEPSIS)
1000.0000 mL | Freq: Once | INTRAVENOUS | Status: AC
  Administered 2013-12-11: 1000 mL via INTRAVENOUS

## 2013-12-11 MED ORDER — HYDROMORPHONE HCL PF 1 MG/ML IJ SOLN
0.2500 mg | INTRAMUSCULAR | Status: DC | PRN
  Administered 2013-12-11 (×4): 0.5 mg via INTRAVENOUS

## 2013-12-11 SURGICAL SUPPLY — 49 items
APL SKNCLS STERI-STRIP NONHPOA (GAUZE/BANDAGES/DRESSINGS) ×1
APPLIER CLIP ROT 10 11.4 M/L (STAPLE) ×3
APR CLP MED LRG 11.4X10 (STAPLE) ×1
BAG SPEC RTRVL LRG 6X4 10 (ENDOMECHANICALS) ×1
BENZOIN TINCTURE PRP APPL 2/3 (GAUZE/BANDAGES/DRESSINGS) ×3 IMPLANT
BLADE SURG ROTATE 9660 (MISCELLANEOUS) IMPLANT
CANISTER SUCTION 2500CC (MISCELLANEOUS) ×3 IMPLANT
CHLORAPREP W/TINT 26ML (MISCELLANEOUS) ×3 IMPLANT
CLIP APPLIE ROT 10 11.4 M/L (STAPLE) ×1 IMPLANT
CLOSURE WOUND 1/2 X4 (GAUZE/BANDAGES/DRESSINGS) ×1
COVER MAYO STAND STRL (DRAPES) ×3 IMPLANT
COVER SURGICAL LIGHT HANDLE (MISCELLANEOUS) ×3 IMPLANT
DRAPE C-ARM 42X72 X-RAY (DRAPES) ×3 IMPLANT
DRAPE UTILITY 15X26 W/TAPE STR (DRAPE) ×6 IMPLANT
DRSG TEGADERM 2-3/8X2-3/4 SM (GAUZE/BANDAGES/DRESSINGS) ×5 IMPLANT
DRSG TEGADERM 4X4.75 (GAUZE/BANDAGES/DRESSINGS) ×3 IMPLANT
ELECT REM PT RETURN 9FT ADLT (ELECTROSURGICAL) ×3
ELECTRODE REM PT RTRN 9FT ADLT (ELECTROSURGICAL) ×1 IMPLANT
FILTER SMOKE EVAC LAPAROSHD (FILTER) ×3 IMPLANT
GAUZE SPONGE 2X2 8PLY STRL LF (GAUZE/BANDAGES/DRESSINGS) ×1 IMPLANT
GLOVE BIO SURGEON STRL SZ7 (GLOVE) ×3 IMPLANT
GLOVE BIO SURGEON STRL SZ7.5 (GLOVE) ×2 IMPLANT
GLOVE BIOGEL PI IND STRL 7.0 (GLOVE) IMPLANT
GLOVE BIOGEL PI IND STRL 7.5 (GLOVE) ×1 IMPLANT
GLOVE BIOGEL PI INDICATOR 7.0 (GLOVE) ×2
GLOVE BIOGEL PI INDICATOR 7.5 (GLOVE) ×4
GLOVE SURG SS PI 7.0 STRL IVOR (GLOVE) ×2 IMPLANT
GOWN STRL REUS W/ TWL LRG LVL3 (GOWN DISPOSABLE) ×4 IMPLANT
GOWN STRL REUS W/TWL LRG LVL3 (GOWN DISPOSABLE) ×12
HEMOSTAT SNOW SURGICEL 2X4 (HEMOSTASIS) ×2 IMPLANT
KIT BASIN OR (CUSTOM PROCEDURE TRAY) ×3 IMPLANT
KIT ROOM TURNOVER OR (KITS) ×3 IMPLANT
NS IRRIG 1000ML POUR BTL (IV SOLUTION) ×3 IMPLANT
PAD ARMBOARD 7.5X6 YLW CONV (MISCELLANEOUS) ×3 IMPLANT
POUCH SPECIMEN RETRIEVAL 10MM (ENDOMECHANICALS) ×3 IMPLANT
SCISSORS LAP 5X35 DISP (ENDOMECHANICALS) ×3 IMPLANT
SET CHOLANGIOGRAPH 5 50 .035 (SET/KITS/TRAYS/PACK) ×3 IMPLANT
SET IRRIG TUBING LAPAROSCOPIC (IRRIGATION / IRRIGATOR) ×3 IMPLANT
SLEEVE ENDOPATH XCEL 5M (ENDOMECHANICALS) ×3 IMPLANT
SPECIMEN JAR SMALL (MISCELLANEOUS) ×3 IMPLANT
SPONGE GAUZE 2X2 STER 10/PKG (GAUZE/BANDAGES/DRESSINGS) ×2
STRIP CLOSURE SKIN 1/2X4 (GAUZE/BANDAGES/DRESSINGS) ×1 IMPLANT
SUT MNCRL AB 4-0 PS2 18 (SUTURE) ×3 IMPLANT
TOWEL OR 17X24 6PK STRL BLUE (TOWEL DISPOSABLE) ×3 IMPLANT
TOWEL OR 17X26 10 PK STRL BLUE (TOWEL DISPOSABLE) ×3 IMPLANT
TRAY LAPAROSCOPIC (CUSTOM PROCEDURE TRAY) ×3 IMPLANT
TROCAR XCEL BLUNT TIP 100MML (ENDOMECHANICALS) ×3 IMPLANT
TROCAR XCEL NON-BLD 11X100MML (ENDOMECHANICALS) ×3 IMPLANT
TROCAR XCEL NON-BLD 5MMX100MML (ENDOMECHANICALS) ×3 IMPLANT

## 2013-12-11 NOTE — ED Notes (Signed)
General Surgery at bedside.

## 2013-12-11 NOTE — ED Provider Notes (Signed)
CSN: [id number]     Arrival date & time 12/11/13  0043 History   First MD Initiated Contact with Patient 12/11/13 0116     Chief Complaint  Patient presents with  . Abdominal Pain  . Diarrhea     (Consider location/radiation/quality/duration/timing/severity/associated sxs/prior Treatment) HPI 53 year old female presents to the emergency department with complaint of 3 days of upper abdominal pain, nausea, vomiting, and diarrhea.  No fevers or chills.  Patient has history of hypertension, asthma, not currently in medications.  No prior history of similar symptoms.  Family history of gallstones.  Pain is diffuse, but worse in right upper quadrant. Past Medical History  Diagnosis Date  . Hypertension   . Asthma     hx of years ago   . Blood transfusion     hx of years ago   . Arthritis     hip  and knees    Past Surgical History  Procedure Laterality Date  . Cesarean section    . Back surgery    . Ankle surgery    . Total hip arthroplasty  10/22/2011    Procedure: TOTAL HIP ARTHROPLASTY;  Surgeon: Gearlean Alf, MD;  Location: [facility];  Service: Orthopedics;  Laterality: Right;   History reviewed. No pertinent family history. History  Substance Use Topics  . Smoking status: Current Every Day Smoker -- 0.50 packs/day for 10 years  . Smokeless tobacco: Never Used  . Alcohol Use: Yes     Comment: occ beer and wine   OB History   Grav Para Term Preterm Abortions TAB SAB Ect Mult Living   4 4 4       5      Review of Systems  See History of Present Illness; otherwise all other systems are reviewed and negative   Allergies  Review of patient's allergies indicates no known allergies.  Home Medications   Prior to Admission medications   Not on File   BP 129/82  Pulse 98  Temp(Src) 98.1 F (36.7 C) (Oral)  Resp 22  SpO2 98% Physical Exam  Nursing note and vitals reviewed. Constitutional: She is oriented to person, place, and time. She appears well-developed and  well-nourished.  Obese female, uncomfortable appearing, tearful  HENT:  Head: Normocephalic and atraumatic.  Right Ear: External ear normal.  Left Ear: External ear normal.  Nose: Nose normal.  Mouth/Throat: Oropharynx is clear and moist.  Eyes: Conjunctivae and EOM are normal. Pupils are equal, round, and reactive to light.  Neck: Normal range of motion. Neck supple. No JVD present. No tracheal deviation present. No thyromegaly present.  Cardiovascular: Regular rhythm, normal heart sounds and intact distal pulses.  Exam reveals no gallop and no friction rub.   No murmur heard. Tachycardia noted  Pulmonary/Chest: Effort normal and breath sounds normal. No stridor. No respiratory distress. She has no wheezes. She has no rales. She exhibits no tenderness.  Abdominal: Soft. Bowel sounds are normal. She exhibits no distension and no mass. There is no tenderness (tender to palpation in right upper quadrant with moderate Murphy's, epigastric tenderness.). There is no rebound and no guarding.  Musculoskeletal: Normal range of motion. She exhibits no edema and no tenderness.  Lymphadenopathy:    She has no cervical adenopathy.  Neurological: She is alert and oriented to person, place, and time. She exhibits normal muscle tone. Coordination normal.  Skin: Skin is warm and dry. No rash noted. No erythema. No pallor.  Psychiatric: She has a normal mood and affect.  Her behavior is normal. Judgment and thought content normal.    ED Course  Procedures (including critical care time) Labs Review Labs Reviewed  CBC WITH DIFFERENTIAL - Abnormal; Notable for the following:    WBC 11.1 (*)    RBC 5.81 (*)    MCV 66.1 (*)    MCH 21.0 (*)    RDW 22.2 (*)    Neutro Abs 7.8 (*)    Monocytes Absolute 1.3 (*)    All other components within normal limits  COMPREHENSIVE METABOLIC PANEL - Abnormal; Notable for the following:    Sodium 128 (*)    Potassium 3.2 (*)    Chloride 87 (*)    All other components  within normal limits  LIPASE, BLOOD - Abnormal; Notable for the following:    Lipase 68 (*)    All other components within normal limits    Imaging Review US Abdomen Complete  12/11/2013   CLINICAL DATA:  Abdominal pain  EXAM: ULTRASOUND ABDOMEN COMPLETE  COMPARISON:  None.  FINDINGS: Gallbladder:  There are cholelithiasis. There is no pericholecystic fluid or gallbladder wall thickening. Negative sonographic Murphy sign.  Common bile duct:  Diameter: 3.8 mm  Liver:  No focal lesion identified. Within normal limits in parenchymal echogenicity.  IVC:  No abnormality visualized.  Pancreas:  Visualized portion unremarkable. The distal body and tail are limited in evaluation secondary to overlying bowel gas.  Spleen:  Size and appearance within normal limits.  Right Kidney:  Length: 12.6 cm. Echogenicity within normal limits. No mass or hydronephrosis visualized.  Left Kidney:  Length: 10.7 cm. Echogenicity within normal limits. No mass or hydronephrosis visualized.  Abdominal aorta:  No aneurysm visualized.  Other findings:  None.  IMPRESSION: Cholelithiasis without sonographic evidence of acute cholecystitis.   Electronically Signed   By: Kathreen Devoid   On: 12/11/2013 03:04     EKG Interpretation None      MDM   Final diagnoses:  Cholelithiasis  Nausea vomiting and diarrhea  Hyponatremia  Dehydration    53 year old female with cholelithiasis noted on ultrasound.  No signs of Cholecystitis.  Plan for pain control and followup outpatient surgery.    Patient has had persistent pain despite multiple rounds of pain medications  Discuss with surgery who will see in the ER after completion of the case in the OR.  Patient updated on findings and plan.        Kalman Drape, MD 12/11/13 [phone]

## 2013-12-11 NOTE — Op Note (Signed)
Laparoscopic Cholecystectomy with IOC Procedure Note  Indications: This patient presents with symptomatic gallbladder disease and will undergo laparoscopic cholecystectomy.  Pre-operative Diagnosis: Calculus of gallbladder with other cholecystitis, without mention of obstruction  Post-operative Diagnosis: Same  Surgeon: Imogene Burn. Starlene Consuegra   Assistants: Sharyn Dross, RNFA  Anesthesia: General endotracheal anesthesia  ASA Class: 2  Procedure Details  The patient was seen again in the Holding Room. The risks, benefits, complications, treatment options, and expected outcomes were discussed with the patient. The possibilities of reaction to medication, pulmonary aspiration, perforation of viscus, bleeding, recurrent infection, finding a normal gallbladder, the need for additional procedures, failure to diagnose a condition, the possible need to convert to an open procedure, and creating a complication requiring transfusion or operation were discussed with the patient. The likelihood of improving the patient's symptoms with return to their baseline status is good.  The patient and/or family concurred with the proposed plan, giving informed consent. The site of surgery properly noted. The patient was taken to Operating Room, identified as PAIDYN MCFERRAN and the procedure verified as Laparoscopic Cholecystectomy with Intraoperative Cholangiogram. A Time Out was held and the above information confirmed.  Prior to the induction of general anesthesia, antibiotic prophylaxis was administered. General endotracheal anesthesia was then administered and tolerated well. After the induction, the abdomen was prepped with Chloraprep and draped in the sterile fashion. The patient was positioned in the supine position.  Local anesthetic agent was injected into the skin near the umbilicus and an incision made. We dissected down to the abdominal fascia with blunt dissection.  The fascia was incised vertically and we  entered the peritoneal cavity bluntly.  A pursestring suture of 0-Vicryl was placed around the fascial opening.  The Hasson cannula was inserted and secured with the stay suture.  Pneumoperitoneum was then created with CO2 and tolerated well without any adverse changes in the patient's vital signs. An 11-mm port was placed in the subxiphoid position.  Two 5-mm ports were placed in the right upper quadrant. All skin incisions were infiltrated with a local anesthetic agent before making the incision and placing the trocars.   We positioned the patient in reverse Trendelenburg, tilted slightly to the patient's left.  The gallbladder was identified, the fundus grasped and retracted cephalad. Adhesions were lysed bluntly and with the electrocautery where indicated, taking care not to injure any adjacent organs or viscus. The infundibulum was grasped and retracted laterally, exposing the peritoneum overlying the triangle of Calot. This was then divided and exposed in a blunt fashion. A critical view of the cystic duct and cystic artery was obtained.  The cystic duct was clearly identified and bluntly dissected circumferentially. The cystic duct was ligated with a clip distally.   An incision was made in the cystic duct and the [facility] cholangiogram catheter introduced. The catheter was secured using a clip. A cholangiogram was then obtained which showed good visualization of the distal and proximal biliary tree with no sign of filling defects or obstruction.  Contrast flowed easily into the duodenum. The catheter was then removed.   The cystic duct was then ligated with clips and divided. The cystic artery was identified, dissected free, ligated with clips and divided as well.   The gallbladder was dissected from the liver bed in retrograde fashion with the electrocautery. The gallbladder was removed and placed in an Endocatch sac. The liver bed was irrigated and inspected. Hemostasis was achieved with the  electrocautery and surgicel SNOW. Copious irrigation was utilized  and was repeatedly aspirated until clear.  The gallbladder and Endocatch sac were then removed through the umbilical port site.  The pursestring suture was used to close the umbilical fascia.    We again inspected the right upper quadrant for hemostasis.  Pneumoperitoneum was released as we removed the trocars.  4-0 Monocryl was used to close the skin.   Benzoin, steri-strips, and clean dressings were applied. The patient was then extubated and brought to the recovery room in stable condition. Instrument, sponge, and needle counts were correct at closure and at the conclusion of the case.   Findings: Cholecystitis with Cholelithiasis  Estimated Blood Loss: less than 50 mL         Drains: none         Specimens: Gallbladder           Complications: None; patient tolerated the procedure well.         Disposition: PACU - hemodynamically stable.         Condition: stable  Imogene Burn. Georgette Dover, MD, [facility] Surgery  General/ Trauma Surgery  12/11/2013 3:18 PM

## 2013-12-11 NOTE — ED Notes (Signed)
Patient denies pain and is resting comfortably. Att

## 2013-12-11 NOTE — Anesthesia Postprocedure Evaluation (Signed)
  Anesthesia Post-op Note  Patient: Marissa Weber  Procedure(s) Performed: Procedure(s): LAPAROSCOPIC CHOLECYSTECTOMY WITH INTRAOPERATIVE CHOLANGIOGRAM (N/A)  Patient Location: PACU  Anesthesia Type:General  Level of Consciousness: awake  Airway and Oxygen Therapy: Patient Spontanous Breathing  Post-op Pain: mild  Post-op Assessment: Post-op Vital signs reviewed  Post-op Vital Signs: Reviewed  Last Vitals:  Filed Vitals:   12/11/13 1545  BP: 158/88  Pulse: 104  Temp:   Resp: 17    Complications: No apparent anesthesia complications

## 2013-12-11 NOTE — OR Nursing (Signed)
Pt. Arrived to pacu from the OR in a bed.  Pt. Attached to oxygen and monitors.  Pt. Very anxious, agitated and confused.  Pt. In pain.  Dilaudid given for pain.  Dr. Oletta Lamas notified and versed and albuterol ordered.

## 2013-12-11 NOTE — Anesthesia Procedure Notes (Signed)
Procedure Name: Intubation Date/Time: 12/11/2013 2:07 PM Performed by: Praveen Coia, Sheron Nightingale Pre-anesthesia Checklist: Patient identified, Timeout performed, Emergency Drugs available, Patient being monitored and Suction available Patient Re-evaluated:Patient Re-evaluated prior to inductionOxygen Delivery Method: Circle system utilized Preoxygenation: Pre-oxygenation with 100% oxygen Intubation Type: IV induction Ventilation: Mask ventilation without difficulty Laryngoscope Size: Mac and 3 Grade View: Grade I Tube type: Oral Number of attempts: 1

## 2013-12-11 NOTE — H&P (Signed)
Marissa Weber is an 53 y.o. female.   Chief Complaint: Right upper quadrant abdominal pain HPI: This is a 53 year old female who presents with a four-day history of right upper quadrant abdominal pain hurting into the epigastrium and back.  The pain is sharp and moderate to severe. She has had nausea, vomiting, and diarrhea. She has no previous history of abdominal pain. She is otherwise without complaints.  Past Medical History  Diagnosis Date  . Hypertension   . Asthma     hx of years ago   . Blood transfusion     hx of years ago   . Arthritis     hip  and knees     Past Surgical History  Procedure Laterality Date  . Cesarean section    . Back surgery    . Ankle surgery    . Total hip arthroplasty  10/22/2011    Procedure: TOTAL HIP ARTHROPLASTY;  Surgeon: Gearlean Alf, MD;  Location: [facility];  Service: Orthopedics;  Laterality: Right;    History reviewed. No pertinent family history. Social History:  reports that she has been smoking.  She has never used smokeless tobacco. She reports that she drinks alcohol. She reports that she does not use illicit drugs.  Allergies: No Known Allergies   (Not in a hospital admission)  Results for orders placed during the hospital encounter of 12/11/13 (from the past 48 hour(s))  CBC WITH DIFFERENTIAL     Status: Abnormal   Collection Time    12/11/13  1:33 AM      Result Value Ref Range   WBC 11.1 (*) 4.0 - 10.5 K/uL   RBC 5.81 (*) 3.87 - 5.11 MIL/uL   Hemoglobin 12.2  12.0 - 15.0 g/dL   HCT 38.4  36.0 - 46.0 %   MCV 66.1 (*) 78.0 - 100.0 fL   MCH 21.0 (*) 26.0 - 34.0 pg   MCHC 31.8  30.0 - 36.0 g/dL   RDW 22.2 (*) 11.5 - 15.5 %   Platelets 240  150 - 400 K/uL   Neutrophils Relative % 70  43 - 77 %   Lymphocytes Relative 17  12 - 46 %   Monocytes Relative 12  3 - 12 %   Eosinophils Relative 1  0 - 5 %   Basophils Relative 0  0 - 1 %   Neutro Abs 7.8 (*) 1.7 - 7.7 K/uL   Lymphs Abs 1.9  0.7 - 4.0 K/uL   Monocytes Absolute 1.3  (*) 0.1 - 1.0 K/uL   Eosinophils Absolute 0.1  0.0 - 0.7 K/uL   Basophils Absolute 0.0  0.0 - 0.1 K/uL   RBC Morphology POLYCHROMASIA PRESENT    COMPREHENSIVE METABOLIC PANEL     Status: Abnormal   Collection Time    12/11/13  1:33 AM      Result Value Ref Range   Sodium 128 (*) 137 - 147 mEq/L   Potassium 3.2 (*) 3.7 - 5.3 mEq/L   Chloride 87 (*) 96 - 112 mEq/L   CO2 21  19 - 32 mEq/L   Glucose, Bld 88  70 - 99 mg/dL   BUN 6  6 - 23 mg/dL   Creatinine, Ser 0.70  0.50 - 1.10 mg/dL   Calcium 9.6  8.4 - 10.5 mg/dL   Total Protein 8.3  6.0 - 8.3 g/dL   Albumin 3.7  3.5 - 5.2 g/dL   AST 25  0 - 37 U/L   ALT  16  0 - 35 U/L   Alkaline Phosphatase 95  39 - 117 U/L   Total Bilirubin 0.5  0.3 - 1.2 mg/dL   GFR calc non Af Amer >90  >90 mL/min   GFR calc Af Amer >90  >90 mL/min   Comment: (NOTE)     The eGFR has been calculated using the CKD EPI equation.     This calculation has not been validated in all clinical situations.     eGFR's persistently <90 mL/min signify possible Chronic Kidney     Disease.  LIPASE, BLOOD     Status: Abnormal   Collection Time    12/11/13  1:33 AM      Result Value Ref Range   Lipase 68 (*) 11 - 59 U/L   US Abdomen Complete  12/11/2013   CLINICAL DATA:  Abdominal pain  EXAM: ULTRASOUND ABDOMEN COMPLETE  COMPARISON:  None.  FINDINGS: Gallbladder:  There are cholelithiasis. There is no pericholecystic fluid or gallbladder wall thickening. Negative sonographic Murphy sign.  Common bile duct:  Diameter: 3.8 mm  Liver:  No focal lesion identified. Within normal limits in parenchymal echogenicity.  IVC:  No abnormality visualized.  Pancreas:  Visualized portion unremarkable. The distal body and tail are limited in evaluation secondary to overlying bowel gas.  Spleen:  Size and appearance within normal limits.  Right Kidney:  Length: 12.6 cm. Echogenicity within normal limits. No mass or hydronephrosis visualized.  Left Kidney:  Length: 10.7 cm. Echogenicity within  normal limits. No mass or hydronephrosis visualized.  Abdominal aorta:  No aneurysm visualized.  Other findings:  None.  IMPRESSION: Cholelithiasis without sonographic evidence of acute cholecystitis.   Electronically Signed   By: Kathreen Devoid   On: 12/11/2013 03:04    Review of Systems  Constitutional: Negative for fever and chills.  Respiratory: Negative for shortness of breath.   Cardiovascular: Negative for chest pain and palpitations.  Gastrointestinal: Positive for nausea, vomiting and abdominal pain.  Genitourinary: Negative.   Musculoskeletal: Negative.   Neurological: Negative.   Endo/Heme/Allergies: Negative.   Psychiatric/Behavioral: Negative.   All other systems reviewed and are negative.   Blood pressure 129/82, pulse 98, temperature 98.1 F (36.7 C), temperature source Oral, resp. rate 22, SpO2 98.00%. Physical Exam  Constitutional: She is oriented to person, place, and time. She appears well-nourished. No distress.  Morbidly obese  HENT:  Head: Normocephalic and atraumatic.  Right Ear: External ear normal.  Left Ear: External ear normal.  Nose: Nose normal.  Mouth/Throat: Oropharynx is clear and moist. No oropharyngeal exudate.  Eyes: Conjunctivae are normal. Pupils are equal, round, and reactive to light. Right eye exhibits no discharge. Left eye exhibits no discharge. No scleral icterus.  Neck: Normal range of motion. Neck supple. No tracheal deviation present.  Cardiovascular: Normal rate, regular rhythm, normal heart sounds and intact distal pulses.   No murmur heard. Respiratory: Effort normal and breath sounds normal. No respiratory distress. She has no wheezes.  GI: Soft. She exhibits no distension. There is tenderness. There is guarding.  There is tenderness with guarding in the right upper quadrant  Musculoskeletal: Normal range of motion. She exhibits no edema and no tenderness.  Lymphadenopathy:    She has no cervical adenopathy.  Neurological: She is  alert and oriented to person, place, and time.  Skin: Skin is warm and dry. No rash noted. She is not diaphoretic. No erythema.  Psychiatric: Her behavior is normal. Judgment normal.     Assessment/Plan  Acute cholecystitis with mild gallstone pancreatitis  I discussed the diagnosis with the patient in detail. Her liver function tests are normal and there is no dilation of the bile duct on ultrasound. Her lipase is mildly elevated. Plan will be to admit the patient to the hospital for IV rehydration, antibiotics, and eventual laparoscopic cholecystectomy.  Harl Bowie 12/11/2013, 6:06 AM

## 2013-12-11 NOTE — Anesthesia Preprocedure Evaluation (Addendum)
Anesthesia Evaluation  Patient identified by MRN, date of birth, ID band Patient awake    Reviewed: Allergy & Precautions, H&P , NPO status , Patient's Chart, lab work & pertinent test results  Airway Mallampati: II      Dental   Pulmonary asthma , Current Smoker,  breath sounds clear to auscultation        Cardiovascular hypertension, Rhythm:Regular Rate:Normal     Neuro/Psych    GI/Hepatic negative GI ROS, Neg liver ROS,   Endo/Other    Renal/GU negative Renal ROS     Musculoskeletal   Abdominal   Peds  Hematology   Anesthesia Other Findings   Reproductive/Obstetrics                          Anesthesia Physical Anesthesia Plan  ASA: III  Anesthesia Plan: General   Post-op Pain Management:    Induction: Intravenous  Airway Management Planned: Oral ETT  Additional Equipment:   Intra-op Plan:   Post-operative Plan: Extubation in OR  Informed Consent: I have reviewed the patients History and Physical, chart, labs and discussed the procedure including the risks, benefits and alternatives for the proposed anesthesia with the patient or authorized representative who has indicated his/her understanding and acceptance.   Dental advisory given  Plan Discussed with: CRNA, Anesthesiologist and Surgeon  Anesthesia Plan Comments:         Anesthesia Quick Evaluation

## 2013-12-11 NOTE — ED Notes (Signed)
Patient transported to Ultrasound 

## 2013-12-11 NOTE — ED Notes (Signed)
Per [facility] - pt w/ c/o RUQ pain x3 days w/ associating diarrhea - pt admits to nausea w/ intermittent vomiting x3 days - pt denies any fever. Pt w/ hx of HTN however has not been taking her medication for this.

## 2013-12-11 NOTE — Transfer of Care (Signed)
Immediate Anesthesia Transfer of Care Note  Patient: Marissa Weber  Procedure(s) Performed: Procedure(s): LAPAROSCOPIC CHOLECYSTECTOMY WITH INTRAOPERATIVE CHOLANGIOGRAM (N/A)  Patient Location: PACU  Anesthesia Type:General  Level of Consciousness: awake, alert , oriented and patient cooperative  Airway & Oxygen Therapy: Patient Spontanous Breathing and Patient connected to face mask oxygen  Post-op Assessment: Report given to PACU RN, Post -op Vital signs reviewed and stable and Patient moving all extremities X 4  Post vital signs: Reviewed and stable  Complications: No apparent anesthesia complications

## 2013-12-11 NOTE — ED Notes (Signed)
Attempted report 

## 2013-12-12 MED ORDER — HYDROCODONE-ACETAMINOPHEN 5-325 MG PO TABS: 1.0000 | ORAL_TABLET | ORAL | Status: DC | PRN

## 2013-12-12 NOTE — Progress Notes (Signed)
Dixon Boos to be D/C'd Home per MD order.  Discussed with the patient and all questions fully answered.    Medication List         HYDROcodone-acetaminophen 5-325 MG per tablet  Commonly known as:  NORCO/VICODIN  Take 1-2 tablets by mouth every 4 (four) hours as needed for moderate pain or severe pain.        VSS, Skin clean, dry and intact without evidence of skin break down, no evidence of skin tears noted. Four laparoscopic incision sites to abdomen covered with gauze. IV catheter discontinued intact. Site without signs and symptoms of complications. Dressing and pressure applied.  An After Visit Summary was printed and given to the patient.  D/c education completed with patient/family including follow up instructions, medication list, d/c activities limitations if indicated, with other d/c instructions as indicated by MD - patient able to verbalize understanding, all questions fully answered.   Patient instructed to return to ED, call 911, or call MD for any changes in condition.   Patient escorted via [place], and D/C home via private auto.  Micki Riley 12/12/2013 11:49 AM

## 2013-12-12 NOTE — Discharge Instructions (Signed)
[facility] °LAPAROSCOPIC SURGERY: POST OP INSTRUCTIONS °Always review your discharge instruction sheet given to you by the facility where your surgery was performed. °IF YOU HAVE DISABILITY OR FAMILY LEAVE FORMS, YOU MUST BRING THEM TO THE OFFICE FOR PROCESSING.   °DO NOT GIVE THEM TO YOUR DOCTOR. ° °1. A prescription for pain medication will be given to you upon discharge.  Take your pain medication as prescribed, if needed.  If narcotic pain medicine is not needed, then you may take acetaminophen (Tylenol) or ibuprofen (Advil) as needed. °2. Take your usually prescribed medications unless otherwise directed. °3. If you need a refill on your pain medication, please contact your pharmacy.  They will contact our office to request authorization. Prescriptions will not be filled after 5pm or on week-ends. °4. You should follow a light diet the first few days after arrival home, such as soup and crackers, etc.  Be sure to include lots of fluids daily. °5. Most patients will experience some swelling and bruising in the area of the incisions.  Ice packs will help.  Swelling and bruising can take several days to resolve.  °6. It is common to experience some constipation if taking pain medication after surgery.  Increasing fluid intake and taking a stool softener (such as Colace) will usually help or prevent this problem from occurring.  A mild laxative (Milk of Magnesia or Miralax) should be taken according to package instructions if there are no bowel movements after 48 hours. °7. Unless discharge instructions indicate otherwise, you may remove your bandages 48 hours after surgery, and you may shower at that time.  You will have steri-strips (small skin tapes) in place directly over the incision.  These strips should be left on the skin for 7-10 days.  If your surgeon used skin glue on the incision, you may shower in 24 hours.  The glue will flake off over the next 2-3 weeks.  Any sutures or staples  will be removed at the office during your follow-up visit. °8. ACTIVITIES:  You may resume regular (light) daily activities beginning the next day--such as daily self-care, walking, climbing stairs--gradually increasing activities as tolerated.  You may have sexual intercourse when it is comfortable.  Refrain from any heavy lifting or straining until approved by your doctor. °a. You may drive when you are no longer taking prescription pain medication, you can comfortably wear a seatbelt, and you can safely maneuver your car and apply brakes. °b. RETURN TO WORK:   2-3 weeks °9. You should see your doctor in the office for a follow-up appointment approximately 2-3 weeks after your surgery.  Make sure that you call for this appointment within a day or two after you arrive home to insure a convenient appointment time. °10. OTHER INSTRUCTIONS: ________________________________________________________________________ °WHEN TO CALL YOUR DOCTOR: °1. Fever over 101.0 °2. Inability to urinate °3. Continued bleeding from incision. °4. Increased pain, redness, or drainage from the incision. °5. Increasing abdominal pain ° °The clinic staff is available to answer your questions during regular business hours.  Please don’t hesitate to call and ask to speak to one of the nurses for clinical concerns.  If you have a medical emergency, go to the nearest emergency room or call 911.  A surgeon from [facility] is always on call at the hospital. °[address], [address], [place], [place]  [address] ? [address] [address], [place], [place]   [address] °[phone] ? [phone] ? FAX [phone] °Web site:   [email or web address] ° °

## 2013-12-12 NOTE — Discharge Summary (Signed)
Physician Discharge Summary  Patient ID: Marissa Weber MRN: [id number] DOB/AGE: [id number] 53 y.o.  Admit date: 12/11/2013 Discharge date: 12/12/2013  Admission Diagnoses:Acute calculus cholecystitis  Discharge Diagnoses: Same Active Problems:   Cholecystitis, acute with cholelithiasis   Discharged Condition: good  Hospital Course: Laparoscopic cholecystectomy with intraoperative cholangiogram 12/11/13.  Feeling better on POD #1.  Pain controlled with Vicodin  Consults: None  Significant Diagnostic Studies: Dg Cholangiogram Operative  12/11/2013   CLINICAL DATA:  Cholecystitis  EXAM: INTRAOPERATIVE CHOLANGIOGRAM  TECHNIQUE: Cholangiographic images from the C-arm fluoroscopic device were submitted for interpretation post-operatively. Please see the procedural report for the amount of contrast and the fluoroscopy time utilized.  COMPARISON:  None.  FINDINGS: No persistent filling defects in the common duct. Intrahepatic ducts are incompletely visualized, appearing decompressed centrally. Contrast passes into the duodenum.  : Negative for retained common duct stone.   Electronically Signed   By: Arne Cleveland M.D.   On: 12/11/2013 15:53   US Abdomen Complete  12/11/2013   CLINICAL DATA:  Abdominal pain  EXAM: ULTRASOUND ABDOMEN COMPLETE  COMPARISON:  None.  FINDINGS: Gallbladder:  There are cholelithiasis. There is no pericholecystic fluid or gallbladder wall thickening. Negative sonographic Murphy sign.  Common bile duct:  Diameter: 3.8 mm  Liver:  No focal lesion identified. Within normal limits in parenchymal echogenicity.  IVC:  No abnormality visualized.  Pancreas:  Visualized portion unremarkable. The distal body and tail are limited in evaluation secondary to overlying bowel gas.  Spleen:  Size and appearance within normal limits.  Right Kidney:  Length: 12.6 cm. Echogenicity within normal limits. No mass or hydronephrosis visualized.  Left Kidney:  Length: 10.7 cm. Echogenicity within  normal limits. No mass or hydronephrosis visualized.  Abdominal aorta:  No aneurysm visualized.  Other findings:  None.  IMPRESSION: Cholelithiasis without sonographic evidence of acute cholecystitis.   Electronically Signed   By: Kathreen Devoid   On: 12/11/2013 03:04      Treatments: surgery: Lap chole with IOC  Discharge Exam: Blood pressure 121/72, pulse 88, temperature 98.2 F (36.8 C), temperature source Oral, resp. rate 18, height 4\' 10"  (1.473 m), weight 190 lb 1.6 oz (86.229 kg), SpO2 86.00%. Dressings dry  Disposition: 01-Home or Self Care  Discharge Orders   Future Orders Complete By Expires   Call MD for:  persistant nausea and vomiting  As directed    Call MD for:  redness, tenderness, or signs of infection (pain, swelling, redness, odor or green/yellow discharge around incision site)  As directed    Call MD for:  severe uncontrolled pain  As directed    Call MD for:  temperature >100.4  As directed    Diet general  As directed    Driving Restrictions  As directed    Increase activity slowly  As directed    May shower / Bathe  As directed    May walk up steps  As directed        Medication List         HYDROcodone-acetaminophen 5-325 MG per tablet  Commonly known as:  NORCO/VICODIN  Take 1-2 tablets by mouth every 4 (four) hours as needed for moderate pain or severe pain.           Follow-up Information   Follow up with [facility] In 3 weeks.   Contact information:   [address] [place]   [place] [address] [phone]  Signed: Imogene Burn. Gaye Scorza 12/12/2013, 11:45 AM

## 2013-12-14 ENCOUNTER — Encounter (HOSPITAL_COMMUNITY): Payer: Self-pay | Admitting: Surgery

## 2013-12-22 ENCOUNTER — Telehealth (INDEPENDENT_AMBULATORY_CARE_PROVIDER_SITE_OTHER): Payer: Self-pay

## 2013-12-22 NOTE — Telephone Encounter (Signed)
LMOM> script written and up front for pick up

## 2013-12-22 NOTE — Telephone Encounter (Signed)
Pt is s/p lap chole on 12/11/13 by Dr. Georgette Dover.  She is requesting a refill of her pain medication.  She was given Norco 5/325 upon discharge.  Message will be sent to our urgent office physician today for approval.  Pt will be notified when Rx is ready for p/u.

## 2014-01-05 ENCOUNTER — Encounter (INDEPENDENT_AMBULATORY_CARE_PROVIDER_SITE_OTHER): Payer: Medicaid Other | Admitting: General Surgery

## 2014-01-08 ENCOUNTER — Telehealth (INDEPENDENT_AMBULATORY_CARE_PROVIDER_SITE_OTHER): Payer: Self-pay

## 2014-01-08 NOTE — Telephone Encounter (Signed)
Pt s/p lap chole on 12/07/13. Pt would like to get a refill on her Norco 5/325mg . She rates her pain 8 out of 10 at this time. She has been taking Ibuprofen and tylenol with no relief. Advised to apply heat or ice to the area for relief also. Will ask Dr Georgette Dover to see if he will authorize a refill.

## 2014-01-08 NOTE — Telephone Encounter (Signed)
Calling pt to let her know that Dr Gershon Crane refilled her Norco 5/325mg  and the Rx was ready for her to pick up at the front desk. Pt verbalized understanding.

## 2014-02-08 ENCOUNTER — Encounter (INDEPENDENT_AMBULATORY_CARE_PROVIDER_SITE_OTHER): Payer: Self-pay | Admitting: Surgery

## 2014-02-08 ENCOUNTER — Ambulatory Visit (INDEPENDENT_AMBULATORY_CARE_PROVIDER_SITE_OTHER): Payer: Medicaid Other | Admitting: Surgery

## 2014-02-08 VITALS — BP 114/86 | HR 80 | Temp 98.8°F | Resp 18 | Ht 59.5 in | Wt 189.6 lb

## 2014-02-08 DIAGNOSIS — K8000 Calculus of gallbladder with acute cholecystitis without obstruction: Secondary | ICD-10-CM

## 2014-02-08 NOTE — Progress Notes (Signed)
Status post laparoscopic cholecystectomy with intraoperative cholangiogram on 12/11/13 for acute calculus cholecystitis. She missed one of her followup appointments. Overall she is doing well. She has a little bit of soreness at her umbilicus. Her incisions are all well-healed with no sign of infection. No sign of hernia or infection at her umbilicus. Appetite and bowel movements are normal. She may resume full activity. She should use ibuprofen when necessary for the umbilical soreness.  Followup when necessary.  Marissa Weber. Georgette Dover, MD, [facility]  General/ Trauma Surgery  02/08/2014 10:26 AM

## 2014-05-10 ENCOUNTER — Emergency Department (HOSPITAL_COMMUNITY): Payer: Medicaid Other

## 2014-05-10 ENCOUNTER — Emergency Department (HOSPITAL_COMMUNITY)
Admission: EM | Admit: 2014-05-10 | Discharge: 2014-05-10 | Disposition: A | Discharge status: Home / Self Care | Payer: Medicaid Other | Attending: Emergency Medicine | Admitting: Emergency Medicine

## 2014-05-10 DIAGNOSIS — I10 Essential (primary) hypertension: Secondary | ICD-10-CM | POA: Insufficient documentation

## 2014-05-10 DIAGNOSIS — Z79899 Other long term (current) drug therapy: Secondary | ICD-10-CM | POA: Insufficient documentation

## 2014-05-10 DIAGNOSIS — Y9389 Activity, other specified: Secondary | ICD-10-CM | POA: Diagnosis not present

## 2014-05-10 DIAGNOSIS — Z8739 Personal history of other diseases of the musculoskeletal system and connective tissue: Secondary | ICD-10-CM | POA: Insufficient documentation

## 2014-05-10 DIAGNOSIS — F17200 Nicotine dependence, unspecified, uncomplicated: Secondary | ICD-10-CM | POA: Insufficient documentation

## 2014-05-10 DIAGNOSIS — S99919A Unspecified injury of unspecified ankle, initial encounter: Principal | ICD-10-CM

## 2014-05-10 DIAGNOSIS — Z8719 Personal history of other diseases of the digestive system: Secondary | ICD-10-CM | POA: Diagnosis not present

## 2014-05-10 DIAGNOSIS — S8990XA Unspecified injury of unspecified lower leg, initial encounter: Secondary | ICD-10-CM | POA: Insufficient documentation

## 2014-05-10 DIAGNOSIS — M25561 Pain in right knee: Secondary | ICD-10-CM

## 2014-05-10 DIAGNOSIS — J45909 Unspecified asthma, uncomplicated: Secondary | ICD-10-CM | POA: Diagnosis not present

## 2014-05-10 DIAGNOSIS — Y9289 Other specified places as the place of occurrence of the external cause: Secondary | ICD-10-CM | POA: Diagnosis not present

## 2014-05-10 DIAGNOSIS — X500XXA Overexertion from strenuous movement or load, initial encounter: Secondary | ICD-10-CM | POA: Insufficient documentation

## 2014-05-10 DIAGNOSIS — S99929A Unspecified injury of unspecified foot, initial encounter: Principal | ICD-10-CM

## 2014-05-10 MED ORDER — OXYCODONE-ACETAMINOPHEN 5-325 MG PO TABS
1.0000 | ORAL_TABLET | Freq: Once | ORAL | Status: AC
  Administered 2014-05-10: 1 via ORAL
  Filled 2014-05-10: qty 1

## 2014-05-10 MED ORDER — ONDANSETRON HCL 4 MG PO TABS: 4.0000 mg | ORAL_TABLET | Freq: Four times a day (QID) | ORAL | Status: DC

## 2014-05-10 MED ORDER — OXYCODONE-ACETAMINOPHEN 5-325 MG PO TABS: 1.0000 | ORAL_TABLET | ORAL | Status: DC | PRN

## 2014-05-10 MED ORDER — OXYCODONE-ACETAMINOPHEN 5-325 MG PO TABS
1.0000 | ORAL_TABLET | Freq: Once | ORAL | Status: AC
Start: 2014-05-10 — End: 2014-05-10
  Administered 2014-05-10: 1 via ORAL
  Filled 2014-05-10: qty 1

## 2014-05-10 NOTE — ED Notes (Signed)
Pt also c/o right hip pain. Denies fall or injury to right hip. Reports hx of hip replacement surgery

## 2014-05-10 NOTE — ED Notes (Signed)
Pt c/o right knee pain since this morning when she woke up. Pt denies any injury or recent trauma to knee. Pt unable to bear weight on right leg. Pt reports hx of right knee pain.

## 2014-05-10 NOTE — Discharge Instructions (Signed)

## 2014-05-10 NOTE — ED Provider Notes (Signed)
CSN: [id number]     Arrival date & time 05/10/14  1843 History  This chart was scribed for non-physician practitioner, Cleatrice Burke, PA-C working with Richarda Blade, MD by Einar Pheasant, ED scribe. This patient was seen in room TR06C/TR06C and the patient's care was started at 10:02 PM.    Chief Complaint  Patient presents with  . Knee Pain   The history is provided by the patient. No language interpreter was used.   HPI Comments: Marissa Weber is a 53 y.o. female who presents to the Emergency Department complaining of worsening right knee pain that started this morning after she stepped on it wrong while getting out of bed. Pt has a hx of right hip replacement and a "spine fusion". Ms. Marissa Weber describes her current right knee pain as "crushing and throbbing" in nature. She also says that she is unable to bear weight or bend the affected leg. Pt endorses taking her husband's Oxycodone, with minimal relief. She denies any calf pain, fever, chills, nausea, emesis, SOB, chest pain, or paraesthesia. No long trips or recent surgeries.   Past Medical History  Diagnosis Date  . Hypertension   . Asthma     hx of years ago   . Blood transfusion     hx of years ago   . Arthritis     hip  and knees   . Cholecystitis    Past Surgical History  Procedure Laterality Date  . Cesarean section    . Ankle surgery    . Total hip arthroplasty  10/22/2011    Procedure: TOTAL HIP ARTHROPLASTY;  Surgeon: Gearlean Alf, MD;  Location: [facility];  Service: Orthopedics;  Laterality: Right;  . Back surgery      SPINAL FUSION  . Cholecystectomy N/A 12/11/2013    Procedure: LAPAROSCOPIC CHOLECYSTECTOMY WITH INTRAOPERATIVE CHOLANGIOGRAM;  Surgeon: Imogene Burn. Georgette Dover, MD;  Location: [facility];  Service: General;  Laterality: N/A;   No family history on file. History  Substance Use Topics  . Smoking status: Current Every Day Smoker -- 0.50 packs/day for 10 years    Types: Cigarettes  . Smokeless tobacco: Never Used   . Alcohol Use: Yes     Comment: occ beer and wine   OB History   Grav Para Term Preterm Abortions TAB SAB Ect Mult Living   4 4 4       5      Review of Systems  Musculoskeletal: Positive for arthralgias.  All other systems reviewed and are negative.  Allergies  Review of patient's allergies indicates no known allergies.  Home Medications   Prior to Admission medications   Medication Sig Start Date End Date Taking? Authorizing Provider  amLODipine (NORVASC) 5 MG tablet Take 5 mg by mouth daily.    Historical Provider, MD   Triage Vitals: BP 110/70  Pulse 100  Temp(Src) 99 F (37.2 C) (Oral)  Resp 20  Ht 4\' 10"  (1.473 m)  Wt 198 lb (89.812 kg)  BMI 41.39 kg/m2  SpO2 96%  Physical Exam  Nursing note and vitals reviewed. Constitutional: She is oriented to person, place, and time. She appears well-developed and well-nourished. No distress.  HENT:  Head: Normocephalic and atraumatic.  Right Ear: External ear normal.  Left Ear: External ear normal.  Nose: Nose normal.  Mouth/Throat: Oropharynx is clear and moist.  Eyes: Conjunctivae are normal.  Neck: Normal range of motion.  Cardiovascular: Normal rate, regular rhythm and normal heart sounds.   Pulmonary/Chest: Effort  normal and breath sounds normal. No stridor. No respiratory distress. She has no wheezes. She has no rales.  Abdominal: Soft. She exhibits no distension.  Musculoskeletal: Normal range of motion.  Tenderness to palpation of Lateral aspect of right knee. No calf tend. Full active ROM of right knee. No redness or warmth. 2+ PT pulses bilaterally. Pt ambulates with antalgic gait.  Neurological: She is alert and oriented to person, place, and time. She has normal strength.  Skin: Skin is warm and dry. She is not diaphoretic. No erythema.  Psychiatric: She has a normal mood and affect. Her behavior is normal.    ED Course  Procedures (including critical care time)  DIAGNOSTIC STUDIES: Oxygen Saturation is  96% on RA, adequate by my interpretation.    COORDINATION OF CARE: 10:22 PM- Will order a Knee sleeve. Will order pain medication. Pt advised of plan for treatment and pt agrees.  Medications  oxyCODONE-acetaminophen (PERCOCET/ROXICET) 5-325 MG per tablet 1 tablet (not administered)  oxyCODONE-acetaminophen (PERCOCET/ROXICET) 5-325 MG per tablet 1 tablet (1 tablet Oral Given 05/10/14 1921)     Imaging Review Dg Hip Complete Right  05/10/2014   CLINICAL DATA:  Right hip and knee pain, no trauma  EXAM: RIGHT HIP - COMPLETE 2+ VIEW  COMPARISON:  Prior radiographs of the right hip and pelvis 10/22/2011  FINDINGS: Surgical changes of prior right hip arthroplasty with long-stem femoral component. No evidence of hardware complication or periprosthetic fracture. Moderate degenerative osteoarthritis of the left hip joint with narrowing of the medial joint space and osteophyte formation. The bony pelvis is intact. Incompletely imaged surgical changes of posterior lumbar interbody fusion. Unremarkable bowel gas pattern.  IMPRESSION: 1. No acute fracture, malalignment or hardware complication. 2. 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 3. Moderate left hip joint osteoarthritis.   Electronically Signed   By: Jacqulynn Cadet M.D.   On: 05/10/2014 20:06   Dg Knee Complete 4 Views Right  05/10/2014   CLINICAL DATA:  Pain.  No trauma.  EXAM: RIGHT KNEE - COMPLETE 4+ VIEW  COMPARISON:  10/06/2013  FINDINGS: There is diffuse decreased bone mineralization. There is mild to moderate tricompartmental osteoarthritic change. There is no acute fracture, dislocation or joint effusion. Findings are unchanged.  IMPRESSION: No acute findings.  Mild-to-moderate osteoarthritis.   Electronically Signed   By: Marin Olp M.D.   On: 05/10/2014 20:06     MDM   Final diagnoses:  Right knee pain    Patient presents to ED with right knee pain after stepping on it wrong. XR is negative for acute  findings. No calf tenderness. Given sudden onset with wrong step doubt DVT. Patient encouraged to f/u with her PCP and orthopedist. Discussed reasons to return to ED immediately. Vital signs stable for discharge. Patient / Family / Caregiver informed of clinical course, understand medical decision-making process, and agree with plan.   I personally performed the services described in this documentation, which was scribed in my presence. The recorded information has been reviewed and is accurate.    Elwyn Lade, PA-C 05/13/14 1524

## 2014-05-10 NOTE — ED Notes (Signed)
Declined W/C at D/C and was escorted to lobby by RN. 

## 2014-05-14 NOTE — ED Provider Notes (Signed)
Medical screening examination/treatment/procedure(s) were performed by non-physician practitioner and as supervising physician I was immediately available for consultation/collaboration.  Richarda Blade, MD 05/14/14 [phone]

## 2014-06-08 ENCOUNTER — Emergency Department (HOSPITAL_COMMUNITY): Payer: Medicaid Other

## 2014-06-08 ENCOUNTER — Emergency Department (HOSPITAL_COMMUNITY)
Admission: EM | Admit: 2014-06-08 | Discharge: 2014-06-09 | Disposition: A | Discharge status: Home / Self Care | Payer: Medicaid Other | Attending: Emergency Medicine | Admitting: Emergency Medicine

## 2014-06-08 ENCOUNTER — Encounter (HOSPITAL_COMMUNITY): Payer: Self-pay | Admitting: Emergency Medicine

## 2014-06-08 DIAGNOSIS — R0682 Tachypnea, not elsewhere classified: Secondary | ICD-10-CM | POA: Diagnosis not present

## 2014-06-08 DIAGNOSIS — M1389 Other specified arthritis, multiple sites: Secondary | ICD-10-CM | POA: Diagnosis not present

## 2014-06-08 DIAGNOSIS — R000 Tachycardia, unspecified: Secondary | ICD-10-CM | POA: Insufficient documentation

## 2014-06-08 DIAGNOSIS — M549 Dorsalgia, unspecified: Secondary | ICD-10-CM | POA: Insufficient documentation

## 2014-06-08 DIAGNOSIS — Z8719 Personal history of other diseases of the digestive system: Secondary | ICD-10-CM | POA: Insufficient documentation

## 2014-06-08 DIAGNOSIS — R079 Chest pain, unspecified: Secondary | ICD-10-CM | POA: Insufficient documentation

## 2014-06-08 DIAGNOSIS — R0602 Shortness of breath: Secondary | ICD-10-CM | POA: Diagnosis present

## 2014-06-08 DIAGNOSIS — J45909 Unspecified asthma, uncomplicated: Secondary | ICD-10-CM | POA: Insufficient documentation

## 2014-06-08 DIAGNOSIS — R05 Cough: Secondary | ICD-10-CM | POA: Diagnosis not present

## 2014-06-08 DIAGNOSIS — Z720 Tobacco use: Secondary | ICD-10-CM | POA: Diagnosis not present

## 2014-06-08 DIAGNOSIS — Z79899 Other long term (current) drug therapy: Secondary | ICD-10-CM | POA: Diagnosis not present

## 2014-06-08 DIAGNOSIS — I10 Essential (primary) hypertension: Secondary | ICD-10-CM | POA: Insufficient documentation

## 2014-06-08 LAB — CBC WITH DIFFERENTIAL/PLATELET
Basophils Absolute: 0.1 10*3/uL (ref 0.0–0.1)
Basophils Relative: 1 % (ref 0–1)
EOS PCT: 1 % (ref 0–5)
Eosinophils Absolute: 0.1 10*3/uL (ref 0.0–0.7)
HEMATOCRIT: 37.4 % (ref 36.0–46.0)
Hemoglobin: 12 g/dL (ref 12.0–15.0)
Lymphocytes Relative: 24 % (ref 12–46)
Lymphs Abs: 2 10*3/uL (ref 0.7–4.0)
MCH: 20.6 pg — AB (ref 26.0–34.0)
MCHC: 32.1 g/dL (ref 30.0–36.0)
MCV: 64.2 fL — AB (ref 78.0–100.0)
MONO ABS: 1 10*3/uL (ref 0.1–1.0)
MONOS PCT: 12 % (ref 3–12)
NEUTROS ABS: 5.1 10*3/uL (ref 1.7–7.7)
Neutrophils Relative %: 62 % (ref 43–77)
PLATELETS: 425 10*3/uL — AB (ref 150–400)
RBC: 5.83 MIL/uL — AB (ref 3.87–5.11)
RDW: 23.2 % — AB (ref 11.5–15.5)
Smear Review: ADEQUATE
WBC: 8.3 10*3/uL (ref 4.0–10.5)

## 2014-06-08 LAB — BASIC METABOLIC PANEL
Anion gap: 20 — ABNORMAL HIGH (ref 5–15)
BUN: 3 mg/dL — AB (ref 6–23)
CHLORIDE: 91 meq/L — AB (ref 96–112)
CO2: 22 meq/L (ref 19–32)
CREATININE: 0.51 mg/dL (ref 0.50–1.10)
Calcium: 9.5 mg/dL (ref 8.4–10.5)
GFR calc Af Amer: >90 mL/min (ref >90)
GFR calc non Af Amer: >90 mL/min (ref >90)
GLUCOSE: 84 mg/dL (ref 70–99)
POTASSIUM: 3.6 meq/L — AB (ref 3.7–5.3)
Sodium: 133 mEq/L — ABNORMAL LOW (ref 137–147)

## 2014-06-08 MED ORDER — IPRATROPIUM BROMIDE 0.02 % IN SOLN
0.5000 mg | Freq: Once | RESPIRATORY_TRACT | Status: AC
  Administered 2014-06-08: 0.5 mg via RESPIRATORY_TRACT
  Filled 2014-06-08: qty 2.5

## 2014-06-08 MED ORDER — ALBUTEROL SULFATE (2.5 MG/3ML) 0.083% IN NEBU
5.0000 mg | INHALATION_SOLUTION | Freq: Once | RESPIRATORY_TRACT | Status: AC
  Administered 2014-06-08: 5 mg via RESPIRATORY_TRACT
  Filled 2014-06-08: qty 6

## 2014-06-08 MED ORDER — IBUPROFEN 200 MG PO TABS
400.0000 mg | ORAL_TABLET | Freq: Once | ORAL | Status: AC
  Administered 2014-06-08: 400 mg via ORAL
  Filled 2014-06-08: qty 2

## 2014-06-08 MED ORDER — ACETAMINOPHEN-CODEINE #3 300-30 MG PO TABS
2.0000 | ORAL_TABLET | Freq: Once | ORAL | Status: AC
  Administered 2014-06-08: 2 via ORAL
  Filled 2014-06-08: qty 2

## 2014-06-08 MED ORDER — SODIUM CHLORIDE 0.9 % IV BOLUS (SEPSIS)
1000.0000 mL | Freq: Once | INTRAVENOUS | Status: AC
  Administered 2014-06-08: 1000 mL via INTRAVENOUS

## 2014-06-08 MED ORDER — OXYCODONE-ACETAMINOPHEN 5-325 MG PO TABS
1.0000 | ORAL_TABLET | Freq: Once | ORAL | Status: AC
  Administered 2014-06-08: 1 via ORAL
  Filled 2014-06-08: qty 1

## 2014-06-08 NOTE — ED Notes (Addendum)
C/o cough, fever, sob, congestion and R back pain. Onset of sx last night, describes cough as dry. No meds in last 6 hrs. Took aleve last night. Congested cough noted. resps shallow and tachypneic. Hurts to move and take a deep breath. Aledrt, NAD, calm, interactive. H/o asthma and smoker. Low grade fever noted.

## 2014-06-08 NOTE — ED Notes (Addendum)
Pt here for right side and upper back pain, hurts when taking a deep breath since this morning. Pt states she does not take blood thinners, reports feeling restless last night. Hx of asthma and does smoke.

## 2014-06-08 NOTE — ED Provider Notes (Addendum)
CSN: [id number]     Arrival date & time 06/08/14  1941 History   First MD Initiated Contact with Patient 06/08/14 2256     Chief Complaint  Patient presents with  . Shortness of Breath  . Fever  . Back Pain  . Cough     (Consider location/radiation/quality/duration/timing/severity/associated sxs/prior Treatment) HPI Marissa Weber is a 53 y.o. female with Past medical history of hypertension, asthma coming in with chest pain back pain shortness of breath. Patient states her most concerning  Symptom is right paraspinal back pain which radiates to her right chest. It is worse with moving or coughing. Her cough has been mildly productive. She states this occurred many weeks ago and improved, and subsequently got worse over the last 2 days. She denies any fevers. The pain is pleuritic and worse with a deep breath. She denies any history of blood clots, long distance travel, or long periods of immobilization.  She is not taking any medications at home for pain. Patient has no further complaints.  10 Systems reviewed and are negative for acute change except as noted in the HPI.     Past Medical History  Diagnosis Date  . Hypertension   . Asthma     hx of years ago   . Blood transfusion     hx of years ago   . Arthritis     hip  and knees   . Cholecystitis    Past Surgical History  Procedure Laterality Date  . Cesarean section    . Ankle surgery    . Total hip arthroplasty  10/22/2011    Procedure: TOTAL HIP ARTHROPLASTY;  Surgeon: Gearlean Alf, MD;  Location: [facility];  Service: Orthopedics;  Laterality: Right;  . Back surgery      SPINAL FUSION  . Cholecystectomy N/A 12/11/2013    Procedure: LAPAROSCOPIC CHOLECYSTECTOMY WITH INTRAOPERATIVE CHOLANGIOGRAM;  Surgeon: Imogene Burn. Georgette Dover, MD;  Location: [place];  Service: General;  Laterality: N/A;   No family history on file. History  Substance Use Topics  . Smoking status: Current Every Day Smoker -- 0.50 packs/day for 10 years   Types: Cigarettes  . Smokeless tobacco: Never Used  . Alcohol Use: Yes     Comment: occ beer and wine   OB History   Grav Para Term Preterm Abortions TAB SAB Ect Mult Living   4 4 4       5      Review of Systems    Allergies  Review of patient's allergies indicates no known allergies.  Home Medications   Prior to Admission medications   Medication Sig Start Date End Date Taking? Authorizing Provider  amLODipine (NORVASC) 5 MG tablet Take 5 mg by mouth daily.   Yes Historical Provider, MD  naproxen sodium (ANAPROX) 220 MG tablet Take 220 mg by mouth 2 (two) times daily as needed (for pain).   Yes Historical Provider, MD  oxyCODONE-acetaminophen (PERCOCET/ROXICET) 5-325 MG per tablet Take 1-2 tablets by mouth every 4 (four) hours as needed for moderate pain or severe pain.   Yes Historical Provider, MD   BP 130/86  Pulse 97  Temp(Src) 99.1 F (37.3 C) (Oral)  Resp 26  Ht 4\' 10"  (1.473 m)  Wt 189 lb (85.73 kg)  BMI 39.51 kg/m2  SpO2 97% Physical Exam  Nursing note and vitals reviewed. Constitutional: She is oriented to person, place, and time. She appears well-developed and well-nourished. No distress.  HENT:  Head: Normocephalic and atraumatic.  Nose: Nose normal.  Mouth/Throat: Oropharynx is clear and moist. No oropharyngeal exudate.  Eyes: Conjunctivae and EOM are normal. Pupils are equal, round, and reactive to light. No scleral icterus.  Neck: Normal range of motion. Neck supple. No JVD present. No tracheal deviation present. No thyromegaly present.  Cardiovascular: Regular rhythm and normal heart sounds.  Exam reveals no gallop and no friction rub.   No murmur heard. tachycardic  Pulmonary/Chest: Breath sounds normal. No respiratory distress. She has no wheezes. She exhibits no tenderness.  Tachypnea noted  Abdominal: Soft. Bowel sounds are normal. She exhibits no distension and no mass. There is no tenderness. There is no rebound and no guarding.  Musculoskeletal:  Normal range of motion. She exhibits no edema and no tenderness.  Lymphadenopathy:    She has no cervical adenopathy.  Neurological: She is alert and oriented to person, place, and time. No cranial nerve deficit. She exhibits normal muscle tone.  Skin: Skin is warm and dry. No rash noted. She is not diaphoretic. No erythema. No pallor.    ED Course  Procedures (including critical care time) Labs Review Labs Reviewed  CBC WITH DIFFERENTIAL - Abnormal; Notable for the following:    RBC 5.83 (*)    MCV 64.2 (*)    MCH 20.6 (*)    RDW 23.2 (*)    Platelets 425 (*)    All other components within normal limits  BASIC METABOLIC PANEL - Abnormal; Notable for the following:    Sodium 133 (*)    Potassium 3.6 (*)    Chloride 91 (*)    BUN 3 (*)    Anion gap 20 (*)    All other components within normal limits  I-STAT TROPOININ, ED    Imaging Review Dg Chest 2 View  06/08/2014   CLINICAL DATA:  One day history of stabbing type chest pain on the right and difficulty breathing  EXAM: CHEST  2 VIEW  COMPARISON:  February 19, 2011  FINDINGS: There is minimal scarring in each lung base. There is no edema or consolidation. Heart size and pulmonary vascularity are normal. No adenopathy. No pneumothorax. There is postoperative change in the lumbar spine. There is degenerative change in the thoracic spine.  IMPRESSION: No edema or consolidation.  Minimal bibasilar lung scarring.   Electronically Signed   By: Lowella Grip M.D.   On: 06/08/2014 20:41   Ct Angio Chest Pe W/cm &/or Wo Cm  06/09/2014   CLINICAL DATA:  Right-sided chest pain. Evaluate for pulmonary embolism.  EXAM: CT ANGIOGRAPHY CHEST WITH CONTRAST  TECHNIQUE: Multidetector CT imaging of the chest was performed using the standard protocol during bolus administration of intravenous contrast. Multiplanar CT image reconstructions and MIPs were obtained to evaluate the vascular anatomy.  CONTRAST:  165mL OMNIPAQUE IOHEXOL 350 MG/ML SOLN   COMPARISON:  06/08/2014  FINDINGS: Vascular Findings:  There is adequate opacification of the pulmonary arterial system with the main pulmonary artery measuring 300 Hounsfield units. No discrete filling defects are seen within the pulmonary arterial tree to the level of the subsegmental pulmonary arteries. Evaluation of the distal subsegmental pulmonary arteries, in particular, within the bilateral lower lobes, is degraded secondary to patient respiratory artifact. Borderline enlarged caliber the main pulmonary artery measuring 31 mm in diameter.  Cardiomegaly. Minimal coronary artery calcifications. No pericardial effusion.  There is mild fusiform ectasia of the ascending thoracic aorta measuring 38 mm in diameter. Bovine configuration of the aortic arch. The branch vessels of the aortic arch  are widely patent throughout their imaged course. No definite thoracic aortic dissection or periaortic stranding on this nongated examination.  Review of the MIP images confirms the above findings.   ----------------------------------------------------------------------------------  Nonvascular Findings:  Evaluation of the pulmonary parenchyma, in particular, within the bilateral lower lobes, is degraded secondary to patient respiratory artifact.  There is minimal grossly symmetric dependent ground-glass atelectasis. No discrete focal airspace opacities. No pleural effusion or pneumothorax. The central pulmonary airways appear widely patent.  No discrete pulmonary nodules per No mediastinal, hilar axillary lymphadenopathy.  Limited early arterial phase evaluation of the upper abdomen is normal.  No acute or aggressive osseous abnormalities, specifically, no displaced right-sided rib fractures. Stigmata of DISH within the thoracic spine.  Regional soft tissues appear normal. Normal appearance of the thyroid gland.  Review of the MIP images confirms the above findings.  IMPRESSION: 1. No acute cardiopulmonary disease.  Specifically, no evidence of pulmonary embolism to the level of the bilateral subsegmental pulmonary arteries. No displaced right-sided rib fractures. 2. Cardiomegaly with borderline to enlarged caliber of the main pulmonary artery, nonspecific though could be seen in the setting of pulmonary arterial hypertension. Further evaluation cardiac echo could be performed as clinically indicated. 3. Mild fusiform ectasia of the ascending thoracic aorta measuring 38 mm in diameter.   Electronically Signed   By: Sandi Mariscal M.D.   On: 06/09/2014 00:49     EKG Interpretation None    MUSE not working: sinus tach rate 102, no ischemic changes, normal intervals  MDM   Final diagnoses:  None    Patient does emergency department for right-sided chest pain. The pain is pleuritic. Will evaluate with CT scan for pulmonary embolism. Patient was given 1 L of fluid, tylenol 3 for pain control. I am currently waiting EKG as well.   Patient was given Tylenol 3 and Motrin for pain., Repeat assessment she states her symptoms improved. CT scan is negative for pulmonary embolism. Patient was informed of CT results regarding thoracic aorta  And need For followup.    Patient's heart rate was in the 90s during my evaluation in the room.Her vital signs remain within normal limits and she is safe for discharge.    Everlene Balls, MD 06/09/14 [id number]  Everlene Balls, MD 06/09/14 [id number]

## 2014-06-09 ENCOUNTER — Emergency Department (HOSPITAL_COMMUNITY): Payer: Medicaid Other

## 2014-06-09 ENCOUNTER — Encounter (HOSPITAL_COMMUNITY): Payer: Self-pay

## 2014-06-09 LAB — I-STAT TROPONIN, ED: Troponin i, poc: 0 ng/mL (ref 0.00–0.08)

## 2014-06-09 MED ORDER — ACETAMINOPHEN-CODEINE #3 300-30 MG PO TABS: 1.0000 | ORAL_TABLET | Freq: Three times a day (TID) | ORAL | Status: DC | PRN

## 2014-06-09 MED ORDER — IBUPROFEN 800 MG PO TABS
800.0000 mg | ORAL_TABLET | Freq: Once | ORAL | Status: AC
  Administered 2014-06-09: 800 mg via ORAL
  Filled 2014-06-09: qty 1

## 2014-06-09 MED ORDER — MORPHINE SULFATE 4 MG/ML IJ SOLN
4.0000 mg | Freq: Once | INTRAMUSCULAR | Status: AC
  Administered 2014-06-09: 4 mg via INTRAVENOUS
  Filled 2014-06-09: qty 1

## 2014-06-09 MED ORDER — IOHEXOL 350 MG/ML SOLN
100.0000 mL | Freq: Once | INTRAVENOUS | Status: AC | PRN
  Administered 2014-06-09: 100 mL via INTRAVENOUS

## 2014-06-09 NOTE — Discharge Instructions (Signed)
Chest Pain (Nonspecific) Ms. Marissa Weber,  You were seen for pain in the right side. Her CT scan results are below and there is no blood clot. You need to followup within 6 months regarding your CT findings of an enlarged aorta. Continue to take pain medicine at home for your right-sided pain and followup with your primary care Dr. Within 3 days for continued evaluation. If your symptoms worsen come back to the emergency department immediately. Thank you.  IMPRESSION: 1. No acute cardiopulmonary disease. Specifically, no evidence of pulmonary embolism to the level of the bilateral subsegmental pulmonary arteries. No displaced right-sided rib fractures. 2. Cardiomegaly with borderline to enlarged caliber of the main pulmonary artery, nonspecific though could be seen in the setting of pulmonary arterial hypertension. Further evaluation cardiac echo could be performed as clinically indicated. 3. Mild fusiform ectasia of the ascending thoracic aorta measuring 38 mm in diameter.  It is often hard to give a diagnosis for the cause of chest pain. There is always a chance that your pain could be related to something serious, such as a heart attack or a blood clot in the lungs. You need to follow up with your doctor. HOME CARE  If antibiotic medicine was given, take it as directed by your doctor. Finish the medicine even if you start to feel better.  For the next few days, avoid activities that bring on chest pain. Continue physical activities as told by your doctor.  Do not use any tobacco products. This includes cigarettes, chewing tobacco, and e-cigarettes.  Avoid drinking alcohol.  Only take medicine as told by your doctor.  Follow your doctor's suggestions for more testing if your chest pain does not go away.  Keep all doctor visits you made. GET HELP IF:  Your chest pain does not go away, even after treatment.  You have a rash with blisters on your chest.  You have a fever. GET HELP RIGHT  AWAY IF:   You have more pain or pain that spreads to your arm, neck, jaw, back, or belly (abdomen).  You have shortness of breath.  You cough more than usual or cough up blood.  You have very bad back or belly pain.  You feel sick to your stomach (nauseous) or throw up (vomit).  You have very bad weakness.  You pass out (faint).  You have chills. This is an emergency. Do not wait to see if the problems will go away. Call your local emergency services (911 in [place] Do not drive yourself to the hospital. MAKE SURE YOU:   Understand these instructions.  Will watch your condition.  Will get help right away if you are not doing well or get worse. Document Released: 01/30/2008 Document Revised: 08/18/2013 Document Reviewed: 01/30/2008 [facility] Patient Information 2015 [place], [place]. This information is not intended to replace advice given to you by your health care provider. Make sure you discuss any questions you have with your health care provider.

## 2014-06-28 ENCOUNTER — Encounter (HOSPITAL_COMMUNITY): Payer: Self-pay

## 2014-08-01 ENCOUNTER — Encounter (HOSPITAL_COMMUNITY): Payer: Self-pay | Admitting: Emergency Medicine

## 2014-08-01 ENCOUNTER — Emergency Department (HOSPITAL_COMMUNITY): Payer: Medicaid Other

## 2014-08-01 ENCOUNTER — Emergency Department (HOSPITAL_COMMUNITY)
Admission: EM | Admit: 2014-08-01 | Discharge: 2014-08-01 | Disposition: A | Discharge status: Home / Self Care | Payer: Medicaid Other | Attending: Emergency Medicine | Admitting: Emergency Medicine

## 2014-08-01 DIAGNOSIS — M1990 Unspecified osteoarthritis, unspecified site: Secondary | ICD-10-CM | POA: Diagnosis not present

## 2014-08-01 DIAGNOSIS — Z720 Tobacco use: Secondary | ICD-10-CM | POA: Diagnosis not present

## 2014-08-01 DIAGNOSIS — M19019 Primary osteoarthritis, unspecified shoulder: Secondary | ICD-10-CM

## 2014-08-01 DIAGNOSIS — R531 Weakness: Secondary | ICD-10-CM | POA: Diagnosis not present

## 2014-08-01 DIAGNOSIS — I10 Essential (primary) hypertension: Secondary | ICD-10-CM | POA: Diagnosis not present

## 2014-08-01 DIAGNOSIS — Z79899 Other long term (current) drug therapy: Secondary | ICD-10-CM | POA: Insufficient documentation

## 2014-08-01 DIAGNOSIS — J45909 Unspecified asthma, uncomplicated: Secondary | ICD-10-CM | POA: Insufficient documentation

## 2014-08-01 DIAGNOSIS — Z8719 Personal history of other diseases of the digestive system: Secondary | ICD-10-CM | POA: Insufficient documentation

## 2014-08-01 DIAGNOSIS — M06811 Other specified rheumatoid arthritis, right shoulder: Secondary | ICD-10-CM | POA: Diagnosis not present

## 2014-08-01 DIAGNOSIS — M25511 Pain in right shoulder: Secondary | ICD-10-CM | POA: Diagnosis present

## 2014-08-01 MED ORDER — HYDROCODONE-ACETAMINOPHEN 5-325 MG PO TABS
1.0000 | ORAL_TABLET | Freq: Once | ORAL | Status: AC
  Administered 2014-08-01: 1 via ORAL
  Filled 2014-08-01: qty 1

## 2014-08-01 MED ORDER — HYDROCODONE-ACETAMINOPHEN 5-325 MG PO TABS: 1.0000 | ORAL_TABLET | Freq: Four times a day (QID) | ORAL | Status: DC | PRN

## 2014-08-01 NOTE — Discharge Instructions (Signed)
Arthritis, Nonspecific Arthritis is pain, redness, warmth, or puffiness (inflammation) of a joint. The joint may be stiff or hurt when you move it. One or more joints may be affected. There are many types of arthritis. Your doctor may not know what type you have right away. The most common cause of arthritis is wear and tear on the joint (osteoarthritis). HOME CARE   Only take medicine as told by your doctor.  Rest the joint as much as possible.  Raise (elevate) your joint if it is puffy.  Use crutches if the painful joint is in your leg.  Drink enough fluids to keep your pee (urine) clear or pale yellow.  Follow your doctor's diet instructions.  Use cold packs for very bad joint pain for 10 to 15 minutes every hour. Ask your doctor if it is okay for you to use hot packs.  Exercise as told by your doctor.  Take a warm shower if you have stiffness in the morning.  Move your sore joints throughout the day. GET HELP RIGHT AWAY IF:   You have a fever.  You have very bad joint pain, puffiness, or redness.  You have many joints that are painful and puffy.  You are not getting better with treatment.  You have very bad back pain or leg weakness.  You cannot control when you poop (bowel movement) or pee (urinate).  You do not feel better in 24 hours or are getting worse.  You are having side effects from your medicine. MAKE SURE YOU:   Understand these instructions.  Will watch your condition.  Will get help right away if you are not doing well or get worse. Document Released: 11/07/2009 Document Revised: 02/12/2012 Document Reviewed: 11/07/2009 [facility] Patient Information 2015 [place], [place]. This information is not intended to replace advice given to you by your health care provider. Make sure you discuss any questions you have with your health care provider. There is no fracture or dislocation of the bones you have prety significant arthritis in your shoulder.  Please  make an appointment with orthopedics to get into a physical therapy, group to keep as much range of motion as possible

## 2014-08-01 NOTE — ED Notes (Addendum)
Awake. Verbally responsive. A/O x4. Resp even and unlabored. No audible adventitious breath sounds noted. ABC's intact. NAD noted. 

## 2014-08-01 NOTE — ED Notes (Signed)
Awake. Verbally responsive. A/O x4. Resp even and unlabored. No audible adventitious breath sounds noted. ABC's intact.  

## 2014-08-01 NOTE — ED Notes (Signed)
Patient returned from X-ray 

## 2014-08-01 NOTE — ED Provider Notes (Signed)
CSN: [id number]     Arrival date & time 08/01/14  0131 History   First MD Initiated Contact with Patient 08/01/14 0210     Chief Complaint  Patient presents with  . Shoulder Pain     (Consider location/radiation/quality/duration/timing/severity/associated sxs/prior Treatment) HPI Comments: Patient states that a few days before Thanksgiving.  She was lifting a heavy [place] into the trunk of her car when she felt sudden pain in her right upper arm shoulder.  Since that time.  She is had limited range of motion and pain.  She's tried taking some over-the-counter medications without any relief In eyes, any numbness or tingling.  She has full range of motion in the wrist, elbow and fingers  Patient is a 53 y.o. female presenting with shoulder pain. The history is provided by the patient.  Shoulder Pain Location:  Shoulder Time since incident:  2 weeks Injury: yes   Mechanism of injury comment:  Lifting Shoulder location:  R shoulder Pain details:    Quality:  Aching   Radiates to:  Does not radiate   Severity:  Moderate   Onset quality:  Gradual   Timing:  Constant   Progression:  Unchanged Chronicity:  New Handedness:  Right-handed Dislocation: yes   Foreign body present:  No foreign bodies Tetanus status:  Unknown Prior injury to area:  No Relieved by:  None tried Worsened by:  Movement Ineffective treatments:  None tried Associated symptoms: decreased range of motion   Associated symptoms: no fever, no muscle weakness, no numbness and no tingling     Past Medical History  Diagnosis Date  . Hypertension   . Asthma     hx of years ago   . Blood transfusion     hx of years ago   . Arthritis     hip  and knees   . Cholecystitis    Past Surgical History  Procedure Laterality Date  . Cesarean section    . Ankle surgery    . Total hip arthroplasty  10/22/2011    Procedure: TOTAL HIP ARTHROPLASTY;  Surgeon: Gearlean Alf, MD;  Location: [facility];  Service: Orthopedics;   Laterality: Right;  . Back surgery      SPINAL FUSION  . Cholecystectomy N/A 12/11/2013    Procedure: LAPAROSCOPIC CHOLECYSTECTOMY WITH INTRAOPERATIVE CHOLANGIOGRAM;  Surgeon: Imogene Burn. Georgette Dover, MD;  Location: [place];  Service: General;  Laterality: N/A;   History reviewed. No pertinent family history. History  Substance Use Topics  . Smoking status: Current Every Day Smoker -- 0.50 packs/day for 10 years    Types: Cigarettes  . Smokeless tobacco: Never Used  . Alcohol Use: Yes     Comment: occ beer and wine   OB History    Gravida Para Term Preterm AB TAB SAB Ectopic Multiple Living   4 4 4       5      Review of Systems  Constitutional: Negative for fever.  Musculoskeletal: Positive for joint swelling.  Skin: Negative for rash and wound.  Neurological: Positive for weakness. Negative for numbness.  All other systems reviewed and are negative.     Allergies  Review of patient's allergies indicates no known allergies.  Home Medications   Prior to Admission medications   Medication Sig Start Date End Date Taking? Authorizing Provider  acetaminophen-codeine (TYLENOL #3) 300-30 MG per tablet Take 1 tablet by mouth every 8 (eight) hours as needed for moderate pain. 06/09/14  Yes Everlene Balls, MD  amLODipine (  NORVASC) 5 MG tablet Take 5 mg by mouth daily.   Yes Historical Provider, MD  naproxen sodium (ANAPROX) 220 MG tablet Take 220 mg by mouth 2 (two) times daily as needed (for pain).   Yes Historical Provider, MD  oxyCODONE-acetaminophen (PERCOCET/ROXICET) 5-325 MG per tablet Take 1-2 tablets by mouth every 4 (four) hours as needed for moderate pain or severe pain.   Yes Historical Provider, MD  HYDROcodone-acetaminophen (NORCO/VICODIN) 5-325 MG per tablet Take 1 tablet by mouth every 6 (six) hours as needed for moderate pain. 08/01/14   Garald Balding, NP   BP 104/64 mmHg  Pulse 87  Temp(Src) 97.8 F (36.6 C) (Oral)  Resp 20  SpO2 99% Physical Exam  Constitutional: She  appears well-developed and well-nourished.  HENT:  Head: Normocephalic.  Eyes: Pupils are equal, round, and reactive to light.  Neck: Normal range of motion.  Cardiovascular: Normal rate.   Pulmonary/Chest: Effort normal.  Musculoskeletal: Normal range of motion. She exhibits tenderness. She exhibits no edema.       Arms: Neurological: She is alert.  Skin: Skin is warm.  Nursing note and vitals reviewed.   ED Course  Procedures (including critical care time) Labs Review Labs Reviewed - No data to display  Imaging Review Dg Shoulder Right  08/01/2014   CLINICAL DATA:  Initial valuation for shoulder pain for 1.5 weeks.  EXAM: RIGHT SHOULDER - 2+ VIEW  COMPARISON:  None.  FINDINGS: No acute fracture dislocation. Humeral head in normal lower with the glenoid. AC joint approximated.  Mild degenerative subacromial spurring present. Irregularity at the posterior lateral margin of the humeral head suspected to reflect periarticular calcification/ chronic degenerative changes, although possible remote trauma could also be considered. Findings may reflect underlying rotator cuff pathology. No other soft tissue abnormality.  IMPRESSION: 1. No acute abnormality about the shoulder. 2. Mild degenerative subacromial spurring. Soft tissue calcification adjacent to the humeral head suspected to be related to degenerative changes/rotator cuff pathology. Alternatively, finding may be related to remote trauma.   Electronically Signed   By: Jeannine Boga M.D.   On: 08/01/2014 03:54     EKG Interpretation None     no fracture or subluxation.  Patient has been given pain medication and referred to orthopedics to get involved in physical therapy  MDM   Final diagnoses:  Shoulder arthritis         Garald Balding, NP 08/01/14 [place], MD 08/01/14 0300

## 2014-08-01 NOTE — ED Notes (Signed)
Pt arrived to the ED with complaint of right shoulder pain.  Pt states she dislocated her shoulder on Thanksgiving and never has had it looked at.  Pt states she has no ROM in the right upper arm.

## 2014-08-01 NOTE — ED Notes (Signed)
Patient transported to X-ray 

## 2015-08-31 ENCOUNTER — Other Ambulatory Visit: Payer: Self-pay | Admitting: Cardiovascular Disease

## 2015-08-31 ENCOUNTER — Ambulatory Visit
Admission: RE | Admit: 2015-08-31 | Discharge: 2015-08-31 | Disposition: A | Discharge status: Home / Self Care | Payer: Medicaid Other | Source: Ambulatory Visit | Attending: Cardiovascular Disease | Admitting: Cardiovascular Disease

## 2015-08-31 DIAGNOSIS — M25561 Pain in right knee: Secondary | ICD-10-CM

## 2015-08-31 DIAGNOSIS — M25552 Pain in left hip: Secondary | ICD-10-CM

## 2015-08-31 DIAGNOSIS — R0602 Shortness of breath: Secondary | ICD-10-CM

## 2015-11-08 ENCOUNTER — Emergency Department (HOSPITAL_COMMUNITY)
Admission: EM | Admit: 2015-11-08 | Discharge: 2015-11-08 | Disposition: A | Discharge status: Home / Self Care | Payer: Medicaid Other | Attending: Emergency Medicine | Admitting: Emergency Medicine

## 2015-11-08 ENCOUNTER — Encounter (HOSPITAL_COMMUNITY): Payer: Self-pay | Admitting: Emergency Medicine

## 2015-11-08 DIAGNOSIS — I10 Essential (primary) hypertension: Secondary | ICD-10-CM | POA: Diagnosis not present

## 2015-11-08 DIAGNOSIS — Z79899 Other long term (current) drug therapy: Secondary | ICD-10-CM | POA: Diagnosis not present

## 2015-11-08 DIAGNOSIS — F17210 Nicotine dependence, cigarettes, uncomplicated: Secondary | ICD-10-CM | POA: Diagnosis not present

## 2015-11-08 DIAGNOSIS — R000 Tachycardia, unspecified: Secondary | ICD-10-CM | POA: Insufficient documentation

## 2015-11-08 DIAGNOSIS — K029 Dental caries, unspecified: Secondary | ICD-10-CM | POA: Diagnosis not present

## 2015-11-08 DIAGNOSIS — K08409 Partial loss of teeth, unspecified cause, unspecified class: Secondary | ICD-10-CM | POA: Insufficient documentation

## 2015-11-08 DIAGNOSIS — J45909 Unspecified asthma, uncomplicated: Secondary | ICD-10-CM | POA: Insufficient documentation

## 2015-11-08 DIAGNOSIS — K0889 Other specified disorders of teeth and supporting structures: Secondary | ICD-10-CM | POA: Insufficient documentation

## 2015-11-08 DIAGNOSIS — M1990 Unspecified osteoarthritis, unspecified site: Secondary | ICD-10-CM | POA: Diagnosis not present

## 2015-11-08 MED ORDER — BUPIVACAINE HCL 0.5 % IJ SOLN
1.8000 mL | Freq: Once | INTRAMUSCULAR | Status: DC
  Filled 2015-11-08: qty 1.8

## 2015-11-08 MED ORDER — OXYCODONE-ACETAMINOPHEN 5-325 MG PO TABS
1.0000 | ORAL_TABLET | Freq: Once | ORAL | Status: AC
  Administered 2015-11-08: 1 via ORAL

## 2015-11-08 MED ORDER — OXYCODONE-ACETAMINOPHEN 5-325 MG PO TABS
ORAL_TABLET | ORAL | Status: AC
  Filled 2015-11-08: qty 1

## 2015-11-08 MED ORDER — BUPIVACAINE HCL (PF) 0.5 % IJ SOLN
1.8000 mL | Freq: Once | INTRAMUSCULAR | Status: AC
  Administered 2015-11-08: 1.8 mL
  Filled 2015-11-08: qty 10

## 2015-11-08 MED ORDER — IBUPROFEN 800 MG PO TABS: 800.0000 mg | ORAL_TABLET | Freq: Three times a day (TID) | ORAL | Status: DC

## 2015-11-08 MED ORDER — PENICILLIN V POTASSIUM 250 MG PO TABS: 500.0000 mg | ORAL_TABLET | Freq: Two times a day (BID) | ORAL | Status: AC

## 2015-11-08 NOTE — Discharge Instructions (Signed)
Take your medications as prescribed. You may also apply ice to affected area for 15-20 minutes 3-4 times daily to help with pain and swelling. Follow-up with one of the dental clinics listed below for further management. I also recommend following up with your primary care provider this week regarding refilling her prescription of hydrocodone for your chronic pain. Please return to the Emergency Department if symptoms worsen or new onset of fever, facial/neck swelling, difficulty breathing, difficulty swallowing resulting in drooling, chest pain, shakiness, abdominal pain, nausea, vomiting, diarrhea, seizures.   [facility] [address] [place], [place] [address] Phone [phone]  The [facility] in [place], [place], exemplifies the [employer] vision to improve the health and quality of life of all [place] by Regulatory affairs officer with a passion to care for the underserved and by leading the nation in community-based, service learning oral health education.  We are committed to offering comprehensive general dental services for adults, children and special needs patients in a safe, caring and professional setting.   Appointments: Our clinic is open Monday through Friday 8:00 a.m. until 5:00 p.m. The amount of time scheduled for an appointment depends on the patients specific needs. We ask that you keep your appointed time for care or provide 24-hour notice of all appointment changes. Parents or legal guardians must accompany minor children.   Payment for Services: Medicaid and other insurance plans are welcome. Payment for services is due when services are rendered and may be made by cash or credit card. If you have dental insurance, we will assist you with your claim submission.    Emergencies:  Emergency  services will be provided Monday through Friday on a walk-in basis.  Please arrive early for emergency services. After hours emergency services will be provided for patients of record as required.   Services:  Personnel officer Dentistry Oral Surgery - Extractions Root Canals Sealants and Tooth Colored Fillings Crowns and Bridges Dentures and Partial Dentures Implant Services Periodontal Services and Cleanings Cosmetic Risk manager [employer] [facility]   [employer] [facility] [employer] is a great source of information about community services available.  Access by dialing 2-1-1 from anywhere in [place], or by website -  CustodianSupply.fi.   Other Local Resources (Updated 08/2015)  Dental  Care   Services    Phone Number and Address  Cost  [facility] For children 50 - 33 years of age:   Cleaning  Tooth brushing/flossing instruction  Sealants, fillings, crowns  Extractions  Emergency treatment  [phone] [address] [place], [place] [address] Charges based on family income.  Medicaid and some insurance plans accepted.     [facility] - [facility], fillings, crowns  Extractions  Emergency treatment [phone] [address] [place], [place]  Pregnant women 82 years of age or older with a Medicaid card  [facility] - [place]  Cleaning  Sealants, fillings, crowns  Extractions  Emergency treatment [phone] [address] [place], [place] Pregnant women 61 years of age or older with a Medicaid card  [facility] For children 53 - 69 years of age:   Cleaning  Tooth brushing/flossing instruction  Sealants, fillings, crowns  Extractions  Emergency treatment Limited  orthodontic services for  patients with Medicaid [phone] [address], [place] [address] Medicaid and [facility] Health Choice cover for children up to age 66 and pregnant women.  Parents of children up to age 46 without Medicaid pay a reduced fee at time of service.  [facility] For children 49 - 79 years of age:   Cleaning  Tooth brushing/flossing instruction  Sealants, fillings, crowns  Extractions  Emergency treatment Limited orthodontic services for patients with Medicaid [phone] [place], [place].  Medicaid and [place] Health Choice cover for children up to age 79 and pregnant women.  Parents of children up to age 62 without Medicaid pay a reduced fee.  [facility]  Sealants, fillings, crowns  Extractions  Hours: Tuesdays and Thursdays, 4:15 - 8 pm [phone] [address], [place], [place] [address] Services free of charge to [facility] residents ages 18-64 who do not have health insurance, Medicare, [place], or [place] benefits and fall within federal poverty guidelines  [place] care in addition to primary medical care, nutritional counseling, and pharmacy:  Engineer, drilling, fillings, crowns  Extractions                  [phone] [facility], [place], [place], [place], [place], [place] [facility], [place], [place] [facility] [place], [place], [place], most insurance.  Also provides services available to all with fees adjusted based on ability to pay.    [facility]   Cleaning  Tooth brushing/flossing instruction  Sealants, fillings, crowns  Extractions  Emergency treatment Hours: Tuesdays, Thursdays, and Fridays from 8 am to 5 pm by appointment only. [phone] [address] [place], [place] [address] [facility] residents with Medicaid (depending on eligibility) and children with [facility] Health Choice - call for more information.  [facility]  Extractions only  Hours: 2nd and 4th Thursday of each month from 6:30 am - 9 am.   [phone] ext. [address] [place], [place] [address] Ages 45 and older only.  Patients are seen on a first come, first served basis.  [employer]  [employer]  Extractions  Orthodontics  Endodontics  Implants/Crowns/Bridges  Complete and partial dentures [phone] [place],  Patients must complete an application for services.  There is often a waiting list.

## 2015-11-08 NOTE — ED Provider Notes (Signed)
CSN: [id number]     Arrival date & time 11/08/15  1658 History  By signing my name below, I, Marissa Weber, attest that this documentation has been prepared under the direction and in the presence of non-physician practitioner, Harlene Ramus, PA-C. Electronically Signed: Evelene Weber, Scribe. 11/08/2015. 5:28 PM.    Chief Complaint  Patient presents with  . Dental Pain    The history is provided by the patient. No language interpreter was used.     HPI Comments:  Marissa Weber is a 55 y.o. female who presents to the Emergency Department complaining of gradually worsening, constant, left lower dental pain x 2 days. She reports associated mild swelling to site. She denies fever, difficulty swallowing, trismus, CP, SOB, vision changes, lightheadedness/dizziness, and HA today. Pt reports an episode of sour taste in mouth ~ 2 days ago. She has taken aleve without relief. Pt has no other complaints or symptoms at this time.   Past Medical History  Diagnosis Date  . Hypertension   . Asthma     hx of years ago   . Blood transfusion     hx of years ago   . Arthritis     hip  and knees   . Cholecystitis    Past Surgical History  Procedure Laterality Date  . Cesarean section    . Ankle surgery    . Total hip arthroplasty  10/22/2011    Procedure: TOTAL HIP ARTHROPLASTY;  Surgeon: Gearlean Alf, MD;  Location: [facility];  Service: Orthopedics;  Laterality: Right;  . Back surgery      SPINAL FUSION  . Cholecystectomy N/A 12/11/2013    Procedure: LAPAROSCOPIC CHOLECYSTECTOMY WITH INTRAOPERATIVE CHOLANGIOGRAM;  Surgeon: Imogene Burn. Georgette Dover, MD;  Location: [facility];  Service: General;  Laterality: N/A;   No family history on file. Social History  Substance Use Topics  . Smoking status: Current Every Day Smoker -- 0.50 packs/day for 10 years    Types: Cigarettes  . Smokeless tobacco: Never Used  . Alcohol Use: Yes     Comment: occ beer and wine   OB History    Gravida Para Term Preterm AB TAB  SAB Ectopic Multiple Living   4 4 4       5      Review of Systems  Constitutional: Negative for fever and chills.  HENT: Positive for dental problem. Negative for sore throat and trouble swallowing.   Eyes: Negative for visual disturbance.  Respiratory: Negative for shortness of breath.   Cardiovascular: Negative for chest pain.  Neurological: Negative for dizziness, light-headedness and headaches.  All other systems reviewed and are negative.   Allergies  Review of patient's allergies indicates no known allergies.  Home Medications   Prior to Admission medications   Medication Sig Start Date End Date Taking? Authorizing Provider  amLODipine (NORVASC) 5 MG tablet Take 5 mg by mouth daily.   Yes Historical Provider, MD  HYDROcodone-acetaminophen (NORCO/VICODIN) 5-325 MG tablet Take 1 tablet by mouth daily. 10/27/15  Yes Historical Provider, MD  lisinopril (PRINIVIL,ZESTRIL) 10 MG tablet Take 10 mg by mouth daily. 10/27/15  Yes Historical Provider, MD  naproxen sodium (ANAPROX) 220 MG tablet Take 220 mg by mouth 2 (two) times daily as needed (for pain).   Yes Historical Provider, MD  PROAIR HFA 108 (90 Base) MCG/ACT inhaler INHALE 2 PUFFS 4 TIMES A DAY AS NEEDED SHORTNESS OF BREATH / WHEEZING 09/29/15  Yes Historical Provider, MD  tiZANidine (ZANAFLEX) 2 MG tablet Take  2 mg by mouth at bedtime. 10/27/15  Yes Historical Provider, MD  ibuprofen (ADVIL,MOTRIN) 800 MG tablet Take 1 tablet (800 mg total) by mouth 3 (three) times daily. 11/08/15   Nona Dell, PA-C  penicillin v potassium (VEETID) 250 MG tablet Take 2 tablets (500 mg total) by mouth 2 (two) times daily. 11/08/15 11/15/15  Chesley Noon Vernie Piet, PA-C   BP 138/97 mmHg  Pulse 112  Temp(Src) 98.4 F (36.9 C) (Oral)  Resp 18  SpO2 98% Physical Exam  Constitutional: She is oriented to person, place, and time. She appears well-developed and well-nourished.  HENT:  Head: Normocephalic and atraumatic.  Mouth/Throat:  Oropharynx is clear and moist and mucous membranes are normal. No trismus in the jaw. Abnormal dentition. Dental caries present. No dental abscesses or uvula swelling. No oropharyngeal exudate, posterior oropharyngeal edema, posterior oropharyngeal erythema or tonsillar abscesses.  Multiple extracted teeth present throughout Tooth number 19 with mild erythema, swelling and induration to surrounding gingiva; no fluctuance or drainage noted  No trismus, stridor, facial or neck swelling   Eyes: Conjunctivae and EOM are normal. Right eye exhibits no discharge. Left eye exhibits no discharge. No scleral icterus.  Neck: Normal range of motion. Neck supple.  Cardiovascular: Regular rhythm, normal heart sounds and intact distal pulses.  Tachycardia present.   Tachycardic at 112  Pulmonary/Chest: Effort normal and breath sounds normal. No stridor. No respiratory distress.  Abdominal: Soft. Bowel sounds are normal. There is no tenderness.  Musculoskeletal: She exhibits no edema.  Lymphadenopathy:    She has no cervical adenopathy.  Neurological: She is alert and oriented to person, place, and time.  Skin: Skin is warm and dry.  Nursing note and vitals reviewed.   ED Course  .Nerve Block Date/Time: 11/08/2015 6:33 PM Performed by: Nona Dell Authorized by: Nona Dell Consent: Verbal consent obtained. Risks and benefits: risks, benefits and alternatives were discussed Consent given by: patient Patient understanding: patient states understanding of the procedure being performed Patient identity confirmed: verbally with patient Indications: pain relief Body area: face/mouth Nerve: inferior alveolar Laterality: left Patient position: sitting Needle gauge: 30 G Location technique: anatomical landmarks Local anesthetic: bupivacaine 0.5% with epinephrine Anesthetic total: 1.8 ml Outcome: pain improved Patient tolerance: Patient tolerated the procedure well with no  immediate complications     DIAGNOSTIC STUDIES:  Oxygen Saturation is 98% on RA, normal by my interpretation.    COORDINATION OF CARE:  5:17 PM Will perform dental block in ED and discharge with antibiotic and dental resources. Discussed treatment plan with pt at bedside and pt agreed to plan.       MDM   Final diagnoses:  Pain, dental    Patient with dentalgia.  No abscess requiring immediate incision and drainage.  Exam not concerning for Ludwig's angina or pharyngeal abscess.  Will treat with PCN. Pt instructed to follow-up with dentist.  Discussed return precautions.   On initial triage patient heart rate 125. During my exam patient's heart rate 115. Patient denies headache, lightheadedness, dizziness, shortness of breath, chest pain, palpitations, numbness, tingling, weakness. On further evaluation patient reports she takes hydrocodone for her chronic right knee pain and notes that she ran out a few days ago. EKG showed sinus tachycardia. Patient has no other signs or symptoms of opiate withdrawal and I feel she is appropriate to be discharged home. I advised patient to follow up with her primary care per rider regarding refilling her prescription for her chronic pain.  Patients blood  pressure 133/103 on initial evaluation. Patient reports history of hypertension and denies taking her medications this morning. Patient denies headache, visual changes, lightheadedness, dizziness, shortness of breath, chest pain, abdominal pain, numbness, tingling, weakness. No signs of hypertensive emergency or urgency at this time. Advised patient to take her medications after being discharged from the ED today. Advised patient to follow up with her PCP in 1-2 weeks to have her blood pressure rechecked.  I personally performed the services described in this documentation, which was scribed in my presence. The recorded information has been reviewed and is accurate.    Chesley Noon Sorrel,  [place] 11/09/15 [id number]  Pattricia Boss, MD 11/10/15 [phone]

## 2015-11-08 NOTE — ED Notes (Signed)
Note sent to pharmacy to send medication.

## 2015-11-08 NOTE — ED Notes (Signed)
Pt reports LL dental pain  For "some time" worse since last night.

## 2016-04-16 ENCOUNTER — Emergency Department (HOSPITAL_COMMUNITY): Payer: Medicaid Other

## 2016-04-16 ENCOUNTER — Encounter (HOSPITAL_COMMUNITY): Payer: Self-pay | Admitting: Emergency Medicine

## 2016-04-16 ENCOUNTER — Observation Stay (HOSPITAL_COMMUNITY)
Admission: EM | Admit: 2016-04-16 | Discharge: 2016-04-17 | Disposition: A | Discharge status: Home / Self Care | Payer: Medicaid Other | Attending: Cardiovascular Disease | Admitting: Cardiovascular Disease

## 2016-04-16 DIAGNOSIS — E669 Obesity, unspecified: Secondary | ICD-10-CM | POA: Diagnosis not present

## 2016-04-16 DIAGNOSIS — J45909 Unspecified asthma, uncomplicated: Secondary | ICD-10-CM | POA: Insufficient documentation

## 2016-04-16 DIAGNOSIS — T464X5A Adverse effect of angiotensin-converting-enzyme inhibitors, initial encounter: Secondary | ICD-10-CM | POA: Diagnosis not present

## 2016-04-16 DIAGNOSIS — M25472 Effusion, left ankle: Secondary | ICD-10-CM

## 2016-04-16 DIAGNOSIS — T783XXA Angioneurotic edema, initial encounter: Secondary | ICD-10-CM | POA: Diagnosis present

## 2016-04-16 DIAGNOSIS — M25572 Pain in left ankle and joints of left foot: Secondary | ICD-10-CM

## 2016-04-16 DIAGNOSIS — F17210 Nicotine dependence, cigarettes, uncomplicated: Secondary | ICD-10-CM | POA: Insufficient documentation

## 2016-04-16 DIAGNOSIS — Z96641 Presence of right artificial hip joint: Secondary | ICD-10-CM | POA: Insufficient documentation

## 2016-04-16 DIAGNOSIS — Z6838 Body mass index (BMI) 38.0-38.9, adult: Secondary | ICD-10-CM | POA: Diagnosis not present

## 2016-04-16 DIAGNOSIS — Z79899 Other long term (current) drug therapy: Secondary | ICD-10-CM | POA: Insufficient documentation

## 2016-04-16 DIAGNOSIS — I10 Essential (primary) hypertension: Secondary | ICD-10-CM | POA: Insufficient documentation

## 2016-04-16 LAB — CBC WITH DIFFERENTIAL/PLATELET
Basophils Absolute: 0.1 10*3/uL (ref 0.0–0.1)
Basophils Relative: 1 %
Eosinophils Absolute: 0.3 10*3/uL (ref 0.0–0.7)
Eosinophils Relative: 3 %
HCT: 37.7 % (ref 36.0–46.0)
Hemoglobin: 11.8 g/dL — ABNORMAL LOW (ref 12.0–15.0)
Lymphocytes Relative: 25 %
Lymphs Abs: 2.3 10*3/uL (ref 0.7–4.0)
MCH: 21.5 pg — ABNORMAL LOW (ref 26.0–34.0)
MCHC: 31.3 g/dL (ref 30.0–36.0)
MCV: 68.5 fL — ABNORMAL LOW (ref 78.0–100.0)
Monocytes Absolute: 0.7 10*3/uL (ref 0.1–1.0)
Monocytes Relative: 8 %
Neutro Abs: 5.9 10*3/uL (ref 1.7–7.7)
Neutrophils Relative %: 63 %
Platelets: 235 10*3/uL (ref 150–400)
RBC: 5.5 MIL/uL — ABNORMAL HIGH (ref 3.87–5.11)
RDW: 21.9 % — ABNORMAL HIGH (ref 11.5–15.5)
WBC: 9.3 10*3/uL (ref 4.0–10.5)

## 2016-04-16 LAB — BASIC METABOLIC PANEL
Anion gap: 5 (ref 5–15)
BUN: 11 mg/dL (ref 6–20)
CO2: 25 mmol/L (ref 22–32)
Calcium: 9.1 mg/dL (ref 8.9–10.3)
Chloride: 106 mmol/L (ref 101–111)
Creatinine, Ser: 0.6 mg/dL (ref 0.44–1.00)
GFR calc Af Amer: >60 mL/min (ref >60)
GFR calc non Af Amer: >60 mL/min (ref >60)
Glucose, Bld: 91 mg/dL (ref 65–99)
Potassium: 3.6 mmol/L (ref 3.5–5.1)
Sodium: 136 mmol/L (ref 135–145)

## 2016-04-16 MED ORDER — OXYCODONE HCL 5 MG PO TABS
5.0000 mg | ORAL_TABLET | Freq: Four times a day (QID) | ORAL | Status: DC | PRN
Start: 2016-04-16 — End: 2016-04-17
  Administered 2016-04-16 – 2016-04-17 (×3): 5 mg via ORAL
  Filled 2016-04-16 (×3): qty 1

## 2016-04-16 MED ORDER — DIPHENHYDRAMINE HCL 50 MG/ML IJ SOLN
12.5000 mg | Freq: Once | INTRAMUSCULAR | Status: DC
  Filled 2016-04-16: qty 1

## 2016-04-16 MED ORDER — SODIUM CHLORIDE 0.9 % IV BOLUS (SEPSIS)
1000.0000 mL | Freq: Once | INTRAVENOUS | Status: AC
  Administered 2016-04-16: 1000 mL via INTRAVENOUS

## 2016-04-16 MED ORDER — ONDANSETRON HCL 4 MG/2ML IJ SOLN: 4.0000 mg | Freq: Three times a day (TID) | INTRAMUSCULAR | Status: AC | PRN

## 2016-04-16 MED ORDER — SODIUM CHLORIDE 0.9 % IV SOLN
INTRAVENOUS | Status: DC
  Administered 2016-04-16 – 2016-04-17 (×3): via INTRAVENOUS

## 2016-04-16 MED ORDER — HYDROCODONE-ACETAMINOPHEN 5-325 MG PO TABS
1.0000 | ORAL_TABLET | Freq: Once | ORAL | Status: AC
  Administered 2016-04-16: 1 via ORAL
  Filled 2016-04-16: qty 1

## 2016-04-16 MED ORDER — HYDRALAZINE HCL 25 MG PO TABS
25.0000 mg | ORAL_TABLET | Freq: Four times a day (QID) | ORAL | Status: DC
  Administered 2016-04-16 – 2016-04-17 (×3): 25 mg via ORAL
  Filled 2016-04-16 (×3): qty 1

## 2016-04-16 MED ORDER — ALBUTEROL SULFATE (2.5 MG/3ML) 0.083% IN NEBU
2.5000 mg | INHALATION_SOLUTION | Freq: Once | RESPIRATORY_TRACT | Status: AC
  Administered 2016-04-16: 2.5 mg via RESPIRATORY_TRACT
  Filled 2016-04-16: qty 3

## 2016-04-16 MED ORDER — MORPHINE SULFATE (PF) 4 MG/ML IV SOLN
4.0000 mg | Freq: Once | INTRAVENOUS | Status: AC
  Administered 2016-04-16: 4 mg via INTRAVENOUS
  Filled 2016-04-16: qty 1

## 2016-04-16 MED ORDER — PREDNISONE 20 MG PO TABS
60.0000 mg | ORAL_TABLET | Freq: Every day | ORAL | Status: DC
  Filled 2016-04-16: qty 3

## 2016-04-16 MED ORDER — FAMOTIDINE IN NACL 20-0.9 MG/50ML-% IV SOLN
20.0000 mg | Freq: Once | INTRAVENOUS | Status: AC
  Administered 2016-04-16: 20 mg via INTRAVENOUS
  Filled 2016-04-16: qty 50

## 2016-04-16 MED ORDER — METHYLPREDNISOLONE SODIUM SUCC 125 MG IJ SOLR
125.0000 mg | Freq: Once | INTRAMUSCULAR | Status: AC
  Administered 2016-04-16: 125 mg via INTRAVENOUS
  Filled 2016-04-16: qty 2

## 2016-04-16 MED ORDER — AMLODIPINE BESYLATE 5 MG PO TABS
5.0000 mg | ORAL_TABLET | Freq: Every day | ORAL | Status: DC
  Administered 2016-04-16 – 2016-04-17 (×2): 5 mg via ORAL
  Filled 2016-04-16 (×2): qty 1

## 2016-04-16 MED ORDER — DIPHENHYDRAMINE HCL 25 MG PO CAPS
25.0000 mg | ORAL_CAPSULE | Freq: Four times a day (QID) | ORAL | Status: DC | PRN
  Administered 2016-04-16: 25 mg via ORAL
  Filled 2016-04-16: qty 1

## 2016-04-16 NOTE — ED Triage Notes (Signed)
Pt is coming from home via [facility]. Pt staes that symptoms started this morning around 0200 with the tip of the lip swelling, but patient put ice on it and was able to go to sleep. Pt stated she woke up from her sleep around 0430 this morning with facial swelling. EMS stated that they believe that this could be a lisinipril reaction. Pt's last dose was yesterday around 1500. 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 Pt also states that her left ankle is sore with some noted swelling, but pt had surgery on that ankle in the past as well. EMS reports vitals of 140/100 and HR of 90. 50 mg of benadryl was given by EMS. Pt is talking with husband and myself with ease.

## 2016-04-16 NOTE — ED Notes (Addendum)
Pt can go to floor  at 18:15

## 2016-04-16 NOTE — ED Provider Notes (Signed)
[facility] DEPT Provider Note   CSN: [id number] Arrival date & time: 04/16/16  1026     History   Chief Complaint Chief Complaint  Patient presents with  . Allergic Reaction    HPI ALIRAH LITT is a 55 y.o. female who presents with lip edema that began early this morning. Patient states she woke up with a tingling in her left and mild swelling around 1:30 or 2 AM today. Patient woke up around 5 AM with her lips drastically more swollen. It also reports a mild tingling sensation to her right forehead. Patient also reports left ankle swelling and pain and tenderness to the lateral area. Patient does not think she was bit by anything, but was outside yesterday. Patient is allergic to bees. Patient had ankle surgery many years ago, but denies any problems with the ankle since then. Patient denies eating anything out of the ordinary yesterday. Patient takes lisinopril and has taken it for the past 8-9 months without difficulty. Patient last took fosinopril yesterday afternoon. Patient denies any chest pain, shortness of breath, tongue swelling or sensation of throat closing, abdominal pain, nausea, vomiting, or urinary symptoms. Patient reports an intermittent strange feeling in her chest since onset, but denies pain. Patient also reports a black dot that she sees in her left eye vision with movement to the right for the past 3 weeks. Patient denies any blurry vision or other vision changes. Patient does have an ophthalmologist that she saw many years ago, but has not had an eye exam in many years.  HPI  Past Medical History:  Diagnosis Date  . Arthritis    hip  and knees   . Asthma    hx of years ago   . Blood transfusion    hx of years ago   . Cholecystitis   . Hypertension     Patient Active Problem List   Diagnosis Date Noted  . Angioedema 04/16/2016  . Cholecystitis, acute with cholelithiasis 12/11/2013  . Postop Acute blood loss anemia 10/25/2011  . Postop Hyponatremia  10/25/2011  . Osteoarthritis of hip 10/22/2011    Past Surgical History:  Procedure Laterality Date  . ANKLE SURGERY    . BACK SURGERY     SPINAL FUSION  . CESAREAN SECTION    . CHOLECYSTECTOMY N/A 12/11/2013   Procedure: LAPAROSCOPIC CHOLECYSTECTOMY WITH INTRAOPERATIVE CHOLANGIOGRAM;  Surgeon: Imogene Burn. Georgette Dover, MD;  Location: [facility];  Service: General;  Laterality: N/A;  . TOTAL HIP ARTHROPLASTY  10/22/2011   Procedure: TOTAL HIP ARTHROPLASTY;  Surgeon: Gearlean Alf, MD;  Location: [facility];  Service: Orthopedics;  Laterality: Right;    OB History    Gravida Para Term Preterm AB Living   4 4 4     5    SAB TAB Ectopic Multiple Live Births                   Home Medications    Prior to Admission medications   Medication Sig Start Date End Date Taking? Authorizing Provider  amLODipine (NORVASC) 5 MG tablet Take 5 mg by mouth daily.   Yes Historical Provider, MD  Aspirin-Acetaminophen-Caffeine (EXCEDRIN EXTRA STRENGTH PO) Take 1 capsule by mouth daily as needed (pain).   Yes Historical Provider, MD  ibuprofen (ADVIL,MOTRIN) 200 MG tablet Take 200 mg by mouth every 6 (six) hours as needed.   Yes Historical Provider, MD  PROAIR HFA 108 (90 Base) MCG/ACT inhaler INHALE 2 PUFFS 4 TIMES A DAY AS NEEDED SHORTNESS  OF BREATH / WHEEZING 09/29/15  Yes Historical Provider, MD    Family History No family history on file.  Social History Social History  Substance Use Topics  . Smoking status: Current Every Day Smoker    Packs/day: 0.50    Years: 10.00    Types: Cigarettes  . Smokeless tobacco: Never Used  . Alcohol use Yes     Comment: occ beer and wine     Allergies   Bee venom   Review of Systems Review of Systems  Constitutional: Negative for chills and fever.  HENT: Positive for facial swelling. Negative for sore throat.   Eyes: Positive for visual disturbance (floater in L eye).  Respiratory: Negative for choking, chest tightness, shortness of breath, wheezing and  stridor.   Cardiovascular: Negative for chest pain.  Gastrointestinal: Negative for abdominal pain, nausea and vomiting.  Genitourinary: Negative for dysuria.  Musculoskeletal: Negative for back pain.  Skin: Negative for rash and wound.  Neurological: Negative for headaches.  Psychiatric/Behavioral: The patient is not nervous/anxious.      Physical Exam Updated Vital Signs BP (!) 164/102 (BP Location: Left Arm) Comment: nurse and doctor observed  Pulse 82   Temp 97.4 F (36.3 C) (Oral)   Resp 18   Ht 4\' 10"  (1.473 m)   Wt 83.9 kg   SpO2 98%   BMI 38.67 kg/m   Physical Exam  Constitutional: She appears well-developed and well-nourished. No distress.  HENT:  Head: Normocephalic and atraumatic.  Mouth/Throat: Oropharynx is clear and moist. No oropharyngeal exudate.  Moderate to severe lip edema; no tongue or visble throat edema; patent airway  Eyes: Conjunctivae are normal. Pupils are equal, round, and reactive to light. Right eye exhibits no discharge. Left eye exhibits no discharge. No scleral icterus.  Neck: Normal range of motion. Neck supple. No thyromegaly present.  Cardiovascular: Normal rate, regular rhythm, normal heart sounds and intact distal pulses.  Exam reveals no gallop and no friction rub.   No murmur heard. Pulmonary/Chest: Effort normal and breath sounds normal. No stridor. No respiratory distress. She has no wheezes. She has no rales.  Abdominal: Soft. Bowel sounds are normal. She exhibits no distension. There is no tenderness. There is no rebound and no guarding.  Musculoskeletal: She exhibits no edema.  L ankle: Medial tenderness to the ankle proximal to lateral malleolus; full range of motion of joint; normal sensation; no warmth or erythema indicating infection  Lymphadenopathy:    She has no cervical adenopathy.  Neurological: She is alert. Coordination normal.  Skin: Skin is warm and dry. No rash noted. She is not diaphoretic. No pallor.  Psychiatric:  She has a normal mood and affect.  Nursing note and vitals reviewed.    ED Treatments / Results  Labs (all labs ordered are listed, but only abnormal results are displayed) Labs Reviewed  CBC WITH DIFFERENTIAL/PLATELET - Abnormal; Notable for the following:       Result Value   RBC 5.50 (*)    Hemoglobin 11.8 (*)    MCV 68.5 (*)    MCH 21.5 (*)    RDW 21.9 (*)    All other components within normal limits  BASIC METABOLIC PANEL    EKG  EKG Interpretation None       Radiology Dg Ankle Complete Left  Result Date: 04/16/2016 CLINICAL DATA:  Acute left ankle pain with no acute trauma. EXAM: LEFT ANKLE COMPLETE - 3+ VIEW COMPARISON:  None. FINDINGS: There is no evidence of fracture, dislocation,  or joint effusion. There is breast sideplate and screw fixation of the distal fibula and cancellous screw fixation of the lateral malleolus. No evidence of hardware in fracture a loosening. Osseous fusion is seen at the level of the distal tibio fibular ligament, likely sequela of prior trauma. IMPRESSION: No acute fracture or dislocation identified about the left ankle. Prior fracture fixation of the left ankle without evidence of hardware loosening. Electronically Signed   By: Fidela Salisbury M.D.   On: 04/16/2016 12:09    Procedures Procedures (including critical care time)  Medications Ordered in ED Medications  sodium chloride 0.9 % bolus 1,000 mL (0 mLs Intravenous Stopped 04/16/16 1455)    And  0.9 %  sodium chloride infusion ( Intravenous New Bag/Given 04/16/16 2016)  ondansetron (ZOFRAN) injection 4 mg (not administered)  oxyCODONE (Oxy IR/ROXICODONE) immediate release tablet 5 mg (5 mg Oral Given 04/16/16 2015)  amLODipine (NORVASC) tablet 5 mg (not administered)  hydrALAZINE (APRESOLINE) tablet 25 mg (not administered)  famotidine (PEPCID) IVPB 20 mg premix (0 mg Intravenous Stopped 04/16/16 1241)  morphine 4 MG/ML injection 4 mg (4 mg Intravenous Given 04/16/16 1201)    methylPREDNISolone sodium succinate (SOLU-MEDROL) 125 mg/2 mL injection 125 mg (125 mg Intravenous Given 04/16/16 1211)  HYDROcodone-acetaminophen (NORCO/VICODIN) 5-325 MG per tablet 1 tablet (1 tablet Oral Given 04/16/16 1454)  albuterol (PROVENTIL) (2.5 MG/3ML) 0.083% nebulizer solution 2.5 mg (2.5 mg Nebulization Given 04/16/16 1708)  methylPREDNISolone sodium succinate (SOLU-MEDROL) 125 mg/2 mL injection 125 mg (125 mg Intravenous Given 04/16/16 2016)     Initial Impression / Assessment and Plan / ED Course  I have reviewed the triage vital signs and the nursing notes.  Pertinent labs & imaging results that were available during my care of the patient were reviewed by me and considered in my medical decision making (see chart for details).  Clinical Course    4:30pm After reevaluation at 5.5 hours, patient has new swelling sensation in her throat when she swallows. I will admit her to the hospital. Consult Dr. Doylene Canard for admission.  Patient given 50 mg Benadryl in route. Patient given 1 L bolus, Pepcid, Solu-Medrol in ED. CBC shows hemoglobin 11.8. BMP unremarkable. Left ankle x-ray shows no acute fracture or dislocation; prior fracture fixation left ankle without evidence of hardware loosening. Low suspicion of infection or more serious pathology. Patient with angioedema most likely due to lisinopril. After adequate evaluation time, patient's symptoms can continue to worsen with a swelling in her throat. I consulted Dr. Doylene Canard who requested that I try albuterol nebulizer prior to admission. Patient symptoms are not improved following this. Dr. Doylene Canard will admit the patient to observation, telemetry for further evaluation and treatment of her symptoms. I recommend follow-up to ophthalmology for further evaluation and treatment of patient's new most likely floater and eye exam. Patient also evaluated by Dr. Rex Kras who guided the patient's management and agrees with plan.  Final Clinical  Impressions(s) / ED Diagnoses   Final diagnoses:  Left ankle swelling  Left ankle pain  Angioedema, initial encounter    New Prescriptions Current Discharge Medication List       Frederica Kuster, Hershal Coria 04/16/16 2131    Sharlett Iles, MD 04/20/16 [phone]

## 2016-04-16 NOTE — ED Notes (Signed)
Patient presents with gross edema of lips and face.  She states the swelling began earlier this morning.  Patient denies trouble breathing or swallowing.  Patient's lung sounds are clear.  She denies N/V.  Patient has been taking Lisinopril for 9 months and denies previous issues with that drug.  No rash apparent.

## 2016-04-16 NOTE — H&P (Signed)
Referring Physician:  [name] is an 56 y.o. female.                       Chief Complaint: Edema of face and lip  HPI: 55 year old female with 1 day history of facial swelling and lip edema with some difficulty swallowing. IV benadryl, Pepcid and solumedrol were given in ER with partial relief. No fever or chills. Patient recalls recurrent dry cough with lisinopril use over last 9 months. Past history positive for asthma and hypertension.  Past Medical History:  Diagnosis Date  . Arthritis    hip  and knees   . Asthma    hx of years ago   . Blood transfusion    hx of years ago   . Cholecystitis   . Hypertension       Past Surgical History:  Procedure Laterality Date  . ANKLE SURGERY    . BACK SURGERY     SPINAL FUSION  . CESAREAN SECTION    . CHOLECYSTECTOMY N/A 12/11/2013   Procedure: LAPAROSCOPIC CHOLECYSTECTOMY WITH INTRAOPERATIVE CHOLANGIOGRAM;  Surgeon: Imogene Burn. Georgette Dover, MD;  Location: [facility];  Service: General;  Laterality: N/A;  . TOTAL HIP ARTHROPLASTY  10/22/2011   Procedure: TOTAL HIP ARTHROPLASTY;  Surgeon: Gearlean Alf, MD;  Location: [facility];  Service: Orthopedics;  Laterality: Right;    No family history on file. Social History:  reports that she has been smoking Cigarettes.  She has a 5.00 pack-year smoking history. She has never used smokeless tobacco. She reports that she drinks alcohol. She reports that she does not use drugs.  Allergies:  Allergies  Allergen Reactions  . Bee Venom Swelling    Swelling wherever the sting is. Also gets dizzy    Medications Prior to Admission  Medication Sig Dispense Refill  . amLODipine (NORVASC) 5 MG tablet Take 5 mg by mouth daily.    . Aspirin-Acetaminophen-Caffeine (EXCEDRIN EXTRA STRENGTH PO) Take 1 capsule by mouth daily as needed (pain).    Marland Kitchen ibuprofen (ADVIL,MOTRIN) 200 MG tablet Take 200 mg by mouth every 6 (six) hours as needed.    Marland Kitchen PROAIR HFA 108 (90 Base) MCG/ACT inhaler INHALE 2 PUFFS 4 TIMES A DAY AS  NEEDED SHORTNESS OF BREATH / WHEEZING  1  . [DISCONTINUED] lisinopril (PRINIVIL,ZESTRIL) 10 MG tablet Take 10 mg by mouth daily.  6  . [DISCONTINUED] ibuprofen (ADVIL,MOTRIN) 800 MG tablet Take 1 tablet (800 mg total) by mouth 3 (three) times daily. (Patient not taking: Reported on 04/16/2016) 21 tablet 0    Results for orders placed or performed during the hospital encounter of 04/16/16 (from the past 48 hour(s))  Basic metabolic panel     Status: None   Collection Time: 04/16/16 12:22 PM  Result Value Ref Range   Sodium 136 135 - 145 mmol/L   Potassium 3.6 3.5 - 5.1 mmol/L   Chloride 106 101 - 111 mmol/L   CO2 25 22 - 32 mmol/L   Glucose, Bld 91 65 - 99 mg/dL   BUN 11 6 - 20 mg/dL   Creatinine, Ser 0.60 0.44 - 1.00 mg/dL   Calcium 9.1 8.9 - 10.3 mg/dL   GFR calc non Af Amer >60 >60 mL/min   GFR calc Af Amer >60 >60 mL/min    Comment: (NOTE) The eGFR has been calculated using the CKD EPI equation. This calculation has not been validated in all clinical situations. eGFR's persistently <60 mL/min signify possible Chronic  Kidney Disease.    Anion gap 5 5 - 15  CBC with Differential     Status: Abnormal   Collection Time: 04/16/16 12:22 PM  Result Value Ref Range   WBC 9.3 4.0 - 10.5 K/uL   RBC 5.50 (H) 3.87 - 5.11 MIL/uL   Hemoglobin 11.8 (L) 12.0 - 15.0 g/dL   HCT 37.7 36.0 - 46.0 %   MCV 68.5 (L) 78.0 - 100.0 fL   MCH 21.5 (L) 26.0 - 34.0 pg   MCHC 31.3 30.0 - 36.0 g/dL   RDW 21.9 (H) 11.5 - 15.5 %   Platelets 235 150 - 400 K/uL    Comment: REPEATED TO VERIFY   Neutrophils Relative % 63 %   Lymphocytes Relative 25 %   Monocytes Relative 8 %   Eosinophils Relative 3 %   Basophils Relative 1 %   Neutro Abs 5.9 1.7 - 7.7 K/uL   Lymphs Abs 2.3 0.7 - 4.0 K/uL   Monocytes Absolute 0.7 0.1 - 1.0 K/uL   Eosinophils Absolute 0.3 0.0 - 0.7 K/uL   Basophils Absolute 0.1 0.0 - 0.1 K/uL   RBC Morphology TARGET CELLS    Dg Ankle Complete Left  Result Date: 04/16/2016 CLINICAL  DATA:  Acute left ankle pain with no acute trauma. EXAM: LEFT ANKLE COMPLETE - 3+ VIEW COMPARISON:  None. FINDINGS: There is no evidence of fracture, dislocation, or joint effusion. There is breast sideplate and screw fixation of the distal fibula and cancellous screw fixation of the lateral malleolus. No evidence of hardware in fracture a loosening. Osseous fusion is seen at the level of the distal tibio fibular ligament, likely sequela of prior trauma. IMPRESSION: No acute fracture or dislocation identified about the left ankle. Prior fracture fixation of the left ankle without evidence of hardware loosening. Electronically Signed   By: Fidela Salisbury M.D.   On: 04/16/2016 12:09    Review Of Systems Constitutional: Negative for chills, fever and weight gain or loss. HENT: Negative sore throat. Positive chronic cough. No hearing loss. Positive floaters in left eye. Respiratory: Negative for choking, positive for chest pain and shortness of breath. Cardiovascular: Positive chest pain. Gastrointestinal: Negative abdominal pain, nausea and vomiting. Genitourinary: No nausea, vomiting or abdominal pain. Musculoskeletal: Chronic left hip and ankle pain. Skin: No skin rash. Neurology: No stroke or seizures. Psychiatric: No admission for anxiety, depression or drug use.   Blood pressure (!) 164/102, pulse 82, temperature 97.4 F (36.3 C), temperature source Oral, resp. rate 18, height 4' 10" (1.473 m), weight 83.9 kg (185 lb), SpO2 98 %. General appearance: cooperative, appears stated age, short and moderately obese. Head: Normocephalic, without obvious abnormality, atraumatic. Mild lower lip and left sided facial swelling more than right side. Throat-mild erythema. Eyes: Brown eyes, conjunctivae/corneas clear. PERRL, EOM's intact. Throat: lips, mucosa, and tongue normal; teeth and gums normal Neck: no adenopathy, no carotid bruit, no JVD, supple, symmetrical, trachea midline and thyroid not  enlarged. Resp: clear to auscultation bilaterally Cardio: regular rate and rhythm, S1, S2 normal, no murmur, click, rub or gallop. GI: soft, non-tender; bowel sounds normal; no masses,  no organomegaly. Extremities: extremities normal, atraumatic, no cyanosis or edema. Skin: War and dry. Neurologic: Alert and oriented X 3, normal strength and tone. Normal coordination and gait.  Assessment/Plan Acute angioedema of face/lip Lisinopril adverse effect. Hypertension, uncontrolled Obesity, excess calorie intake Asthma  IV Benadryl, solumedrol, Pepcid and observe progress  Sehaj Kolden S, MD  04/16/2016, 9:19 PM

## 2016-04-16 NOTE — ED Notes (Signed)
Bed: WA09 Expected date:  Expected time:  Means of arrival:  Comments: EMS-angioedema-airway intact

## 2016-04-17 MED ORDER — HYDRALAZINE HCL 50 MG PO TABS: 50.0000 mg | ORAL_TABLET | Freq: Three times a day (TID) | ORAL | 1 refills | Status: DC

## 2016-04-17 MED ORDER — TIZANIDINE HCL 2 MG PO TABS
2.0000 mg | ORAL_TABLET | Freq: Two times a day (BID) | ORAL | Status: DC
  Administered 2016-04-17: 2 mg via ORAL
  Filled 2016-04-17 (×2): qty 1

## 2016-04-17 MED ORDER — TIZANIDINE HCL 2 MG PO TABS: 2.0000 mg | ORAL_TABLET | Freq: Every day | ORAL | 1 refills | Status: DC

## 2016-04-17 MED ORDER — HYDRALAZINE HCL 50 MG PO TABS: 50.0000 mg | ORAL_TABLET | Freq: Three times a day (TID) | ORAL | Status: DC

## 2016-04-17 NOTE — Discharge Summary (Signed)
Physician Discharge Summary  Patient ID: ARDYN EPLER MRN: [id number] DOB/AGE: [birth year] 55 y.o.  Admit date: 04/16/2016 Discharge date: 04/17/2016  Admission Diagnoses: Acute angioedema Lisinopril adverse effect Hypertension Obesity Asthma  Discharge Diagnoses:  Principle problem: * Angioedema of lips and face* Active Problems:   Lisinopril adverse effect   Hypertension   Obesity, excess Calorie intake   Asthma   S/P left ankle surgery  Discharged Condition: good  Hospital Course: 55 year old female with past history of hypertension and asthma presented with 1 day history of lip and face swelling with some difficulty swallowing. She had chronic cough post lisinopril use over 9 months. She had slow and steady improvement with IV solumedrol, Benadryl and Pepcid use and lisinopril was discontinued. Hydralazine was added for uncontrolled hypertension. She will see me in one week and follow heart healthy diet and activity.  Consults: cardiology  Significant Diagnostic Studies: labs: Near normal CBC with Hemoglobin of 11.8 g/dL. Normal BMET.  X-ray left ankle-Stable.  EKG-SR, Right axis deviation, 1st. Degree AV block.  Treatments: steroids: solu-medrol and Benadryl and Pepcid. Hydralazine for blood pressure control.  Discharge Exam: Blood pressure (!) 143/80, pulse 91, temperature 98.5 F (36.9 C), temperature source Oral, resp. rate 18, height 4\' 10"  (1.473 m), weight 83.9 kg (185 lb), SpO2 99 %. General appearance: alert, cooperative, no distress and moderately obese and short  Head: Normocephalic, without obvious abnormality, atraumatic. Resolved facial and lip swelling. Eyes: Brown eyes, conjunctivae/corneas clear. PERRL, EOM's intact. Neck: no adenopathy, no carotid bruit, no JVD, supple, symmetrical, trachea midline and thyroid not enlarged. Resp: clear to auscultation bilaterally Cardio: regular rate and rhythm, S1, S2 normal, no murmur, click, rub or gallop GI: soft,  non-tender; bowel sounds normal; no masses,  no organomegaly Extremities: extremities normal, atraumatic, no cyanosis or edema Skin: Warm and dry.  Neurologic: Alert and oriented X 3, normal strength and tone. Normal coordination and gait  Disposition: 01-Home or Self Care     Medication List    STOP taking these medications   ibuprofen 200 MG tablet Commonly known as:  ADVIL,MOTRIN     TAKE these medications   amLODipine 5 MG tablet Commonly known as:  NORVASC Take 5 mg by mouth daily.   EXCEDRIN EXTRA STRENGTH PO Take 1 capsule by mouth daily as needed (pain).   hydrALAZINE 50 MG tablet Commonly known as:  APRESOLINE Take 1 tablet (50 mg total) by mouth every 8 (eight) hours.   PROAIR HFA 108 (90 Base) MCG/ACT inhaler Generic drug:  albuterol INHALE 2 PUFFS 4 TIMES A DAY AS NEEDED SHORTNESS OF BREATH / WHEEZING   tiZANidine 2 MG tablet Commonly known as:  ZANAFLEX Take 1 tablet (2 mg total) by mouth at bedtime.      Follow-up Information    Tito Ausmus S, MD Follow up in 1 week(s).   Specialty:  Cardiology Contact information: [place] [address] [phone]           Signed: Birdie Riddle 04/17/2016, 6:07 PM

## 2016-06-13 ENCOUNTER — Emergency Department (HOSPITAL_BASED_OUTPATIENT_CLINIC_OR_DEPARTMENT_OTHER): Payer: Medicaid Other

## 2016-06-13 ENCOUNTER — Encounter (HOSPITAL_BASED_OUTPATIENT_CLINIC_OR_DEPARTMENT_OTHER): Payer: Self-pay | Admitting: *Deleted

## 2016-06-13 ENCOUNTER — Emergency Department (HOSPITAL_BASED_OUTPATIENT_CLINIC_OR_DEPARTMENT_OTHER)
Admission: EM | Admit: 2016-06-13 | Discharge: 2016-06-14 | Disposition: A | Discharge status: Home / Self Care | Payer: Medicaid Other | Attending: Emergency Medicine | Admitting: Emergency Medicine

## 2016-06-13 DIAGNOSIS — Y999 Unspecified external cause status: Secondary | ICD-10-CM | POA: Diagnosis not present

## 2016-06-13 DIAGNOSIS — M7041 Prepatellar bursitis, right knee: Secondary | ICD-10-CM

## 2016-06-13 DIAGNOSIS — F17210 Nicotine dependence, cigarettes, uncomplicated: Secondary | ICD-10-CM | POA: Diagnosis not present

## 2016-06-13 DIAGNOSIS — I10 Essential (primary) hypertension: Secondary | ICD-10-CM | POA: Insufficient documentation

## 2016-06-13 DIAGNOSIS — J45909 Unspecified asthma, uncomplicated: Secondary | ICD-10-CM | POA: Insufficient documentation

## 2016-06-13 DIAGNOSIS — Y929 Unspecified place or not applicable: Secondary | ICD-10-CM | POA: Diagnosis not present

## 2016-06-13 DIAGNOSIS — W1839XA Other fall on same level, initial encounter: Secondary | ICD-10-CM | POA: Diagnosis not present

## 2016-06-13 DIAGNOSIS — M79661 Pain in right lower leg: Secondary | ICD-10-CM | POA: Diagnosis present

## 2016-06-13 DIAGNOSIS — Y939 Activity, unspecified: Secondary | ICD-10-CM | POA: Insufficient documentation

## 2016-06-13 DIAGNOSIS — Z7982 Long term (current) use of aspirin: Secondary | ICD-10-CM | POA: Diagnosis not present

## 2016-06-13 DIAGNOSIS — W19XXXA Unspecified fall, initial encounter: Secondary | ICD-10-CM

## 2016-06-13 DIAGNOSIS — Z79899 Other long term (current) drug therapy: Secondary | ICD-10-CM | POA: Diagnosis not present

## 2016-06-13 NOTE — ED Notes (Addendum)
Patient reports she fell into a door jam, @ 5 days ago. Patient with abrasion to right shin. Significant swelling noted to the right leg and foot. Patient reports decreased mobility in right leg. Patient reports tonight she has had "some 'Natural Light' because she was in so much pain". Family at bedside. Patient has full ROM to right leg.

## 2016-06-13 NOTE — ED Notes (Signed)
Patient transported to [place]. Patient took pants and purse with her, shoes remain at bedside.

## 2016-06-13 NOTE — ED Provider Notes (Signed)
[facility] Provider Note   CSN: [id number] Arrival date & time: 06/13/16  2154   By signing my name below, I, Neta Mends, attest that this documentation has been prepared under the direction and in the presence of [employer], PA-C. Electronically Signed: Neta Mends, ED Scribe. 06/13/2016. 10:15 PM.   History   Chief Complaint Chief Complaint  Patient presents with  . Fall    The history is provided by the patient. No language interpreter was used.   HPI Comments:  Marissa Weber is a 55 y.o. female with PMHx of HTN, arthritis, and asthma who presents to the Emergency Department, here due to a right lower leg injury that occurred 5 days ago. Pt reports generalized pain in her lower right leg. Pt complains of associated swelling. Pt has been ambulatory but with some difficulty. Pt states that she took NyQuil for a cold the night that she fell. Pt states that she has been drinking [employer] which has provided mild relief for pain. Pt denies other associated symptoms.   Past Medical History:  Diagnosis Date  . Arthritis    hip  and knees   . Asthma    hx of years ago   . Blood transfusion    hx of years ago   . Cholecystitis   . Hypertension     Patient Active Problem List   Diagnosis Date Noted  . Angioedema 04/16/2016  . Cholecystitis, acute with cholelithiasis 12/11/2013  . Postop Acute blood loss anemia 10/25/2011  . Postop Hyponatremia 10/25/2011  . Osteoarthritis of hip 10/22/2011    Past Surgical History:  Procedure Laterality Date  . ANKLE SURGERY    . BACK SURGERY     SPINAL FUSION  . CESAREAN SECTION    . CHOLECYSTECTOMY N/A 12/11/2013   Procedure: LAPAROSCOPIC CHOLECYSTECTOMY WITH INTRAOPERATIVE CHOLANGIOGRAM;  Surgeon: Imogene Burn. Georgette Dover, MD;  Location: [facility];  Service: General;  Laterality: N/A;  . TOTAL HIP ARTHROPLASTY  10/22/2011   Procedure: TOTAL HIP ARTHROPLASTY;  Surgeon: Gearlean Alf, MD;  Location: [facility];   Service: Orthopedics;  Laterality: Right;    OB History    Gravida Para Term Preterm AB Living   4 4 4     5    SAB TAB Ectopic Multiple Live Births                   Home Medications    Prior to Admission medications   Medication Sig Start Date End Date Taking? Authorizing Provider  amLODipine (NORVASC) 5 MG tablet Take 5 mg by mouth daily.    Historical Provider, MD  Aspirin-Acetaminophen-Caffeine (EXCEDRIN EXTRA STRENGTH PO) Take 1 capsule by mouth daily as needed (pain).    Historical Provider, MD  hydrALAZINE (APRESOLINE) 50 MG tablet Take 1 tablet (50 mg total) by mouth every 8 (eight) hours. 04/17/16   [name], MD  HYDROcodone-acetaminophen (NORCO/VICODIN) 5-325 MG tablet Take 1-2 tablets by mouth every 6 hours as needed for pain and/or cough. 06/14/16   Lauranne Beyersdorf, PA-C  PROAIR HFA 108 (90 Base) MCG/ACT inhaler INHALE 2 PUFFS 4 TIMES A DAY AS NEEDED SHORTNESS OF BREATH / WHEEZING 09/29/15   Historical Provider, MD  tiZANidine (ZANAFLEX) 2 MG tablet Take 1 tablet (2 mg total) by mouth at bedtime. 04/17/16   [name], MD    Family History History reviewed. No pertinent family history.  Social History Social History  Substance Use Topics  . Smoking status: Current Every  Day Smoker    Packs/day: 0.50    Years: 10.00    Types: Cigarettes  . Smokeless tobacco: Never Used  . Alcohol use 2.4 oz/week    4 Cans of beer per week     Allergies   Bee venom and Lisinopril   Review of Systems Review of Systems 10 systems reviewed and all are negative for acute change except as noted in the HPI.    Physical Exam Updated Vital Signs BP 122/81 (BP Location: Right Arm)   Pulse 92   Temp 97.6 F (36.4 C) (Oral)   Resp 18   Ht 4' 10.5" (1.486 m)   Wt 86.2 kg   SpO2 92%   BMI 39.03 kg/m   Physical Exam  Constitutional: She is oriented to person, place, and time. She appears well-developed and well-nourished. No distress.  Intoxicated, slurring her speech    HENT:  Head: Normocephalic and atraumatic.  Mouth/Throat: Oropharynx is clear and moist.  Eyes: Conjunctivae and EOM are normal. Pupils are equal, round, and reactive to light.  Neck: Normal range of motion.  Cardiovascular: Normal rate, regular rhythm and intact distal pulses.   Pulmonary/Chest: Effort normal and breath sounds normal.  Abdominal: Soft. There is no tenderness.  Musculoskeletal: Normal range of motion. She exhibits edema and tenderness. She exhibits no deformity.  Significant pitting edema to left foot, diffusely exquisitely tenderness to palpation on the foot, bilateral malleoli and tib-fib area, she has a swelling with contusion of the proximal medial aspect. Full range of motion to the knee with no abnormal laxity on anterior posterior drawer  Neurological: She is alert and oriented to person, place, and time.  Skin: She is not diaphoretic.  Psychiatric: She has a normal mood and affect.  Nursing note and vitals reviewed.    ED Treatments / Results  DIAGNOSTIC STUDIES:  Oxygen Saturation is 100% on RA, normal by my interpretation.    COORDINATION OF CARE:  10:15 PM Discussed treatment plan with pt at bedside and pt agreed to plan.   Labs (all labs ordered are listed, but only abnormal results are displayed) Labs Reviewed - No data to display  EKG  EKG Interpretation None       Radiology Dg Tibia/fibula Right  Result Date: 06/13/2016 CLINICAL DATA:  55 year old female with fall and right lower extremity pain. EXAM: RIGHT TIBIA AND FIBULA - 2 VIEW; RIGHT FOOT COMPLETE - 3+ VIEW COMPARISON:  Right knee radiograph dated 08/31/2015 FINDINGS: There is no acute fracture or dislocation. The bones are osteopenic. Moderate to severe osteoarthritic changes of the right knee with tricompartmental narrowing and bone spurring similar to prior exam. There is degenerative changes of the tarsometatarsal joints primarily involving the first tarsometatarsal joint. No acute  fracture identified. Bone spurring and small bone fragment at the first tarsometatarsal joint, likely chronic. There is diffuse soft tissue swelling of the foot primarily over the dorsal aspect of the foot. No radiopaque foreign object or soft tissue gas identified. There is a 4.9 x 3.1 cm ovoid low attenuating structure in the soft tissues of the medial aspect of the calf at the level of the mid femoral shaft likely representing an intramuscular lipoma or cyst. There is partial calcification of the tibiofibular interosseous membrane. IMPRESSION: No acute fracture or dislocation. Moderate to severe osteoarthritic changes of the knee. Diffuse subcutaneous soft tissue edema of the left and soft tissue swelling of the foot. Clinical correlation is recommended. Probable intramuscular lipoma or cyst in the medial calf  likely in the medial gastrocnemius. Ultrasound is recommended for further evaluation. Electronically Signed   By: Anner Crete M.D.   On: 06/13/2016 23:52   Dg Ankle Complete Right  Result Date: 06/13/2016 CLINICAL DATA:  Status post fall 5 days ago, with right anterior lower leg pain and hematoma. Initial encounter. EXAM: RIGHT ANKLE - COMPLETE 3+ VIEW COMPARISON:  None. FINDINGS: There is no evidence of fracture or dislocation. The ankle mortise is intact; the interosseous space is within normal limits. No talar tilt or subluxation is seen. Plantar and posterior calcaneal spurs are seen. The joint spaces are preserved. Diffuse soft tissue swelling is noted about the ankle. IMPRESSION: No evidence of fracture or dislocation. Electronically Signed   By: Garald Balding M.D.   On: 06/13/2016 23:46   US Venous Img Lower Unilateral Right  Result Date: 06/13/2016 CLINICAL DATA:  Injury to the right leg after fall into door jamb, with lump overlying the patella. Initial encounter. EXAM: RIGHT LOWER EXTREMITY VENOUS DOPPLER ULTRASOUND TECHNIQUE: Gray-scale sonography with graded compression, as well  as color Doppler and duplex ultrasound were performed to evaluate the lower extremity deep venous systems from the level of the common femoral vein and including the common femoral, femoral, profunda femoral, popliteal and calf veins including the posterior tibial, peroneal and gastrocnemius veins when visible. The superficial great saphenous vein was also interrogated. Spectral Doppler was utilized to evaluate flow at rest and with distal augmentation maneuvers in the common femoral, femoral and popliteal veins. COMPARISON:  None. FINDINGS: Contralateral Common Femoral Vein: Respiratory phasicity is normal and symmetric with the symptomatic side. No evidence of thrombus. Normal compressibility. Common Femoral Vein: No evidence of thrombus. Normal compressibility, respiratory phasicity and response to augmentation. Saphenofemoral Junction: No evidence of thrombus. Normal compressibility and flow on color Doppler imaging. Profunda Femoral Vein: No evidence of thrombus. Normal compressibility and flow on color Doppler imaging. Femoral Vein: No evidence of thrombus. Normal compressibility, respiratory phasicity and response to augmentation. Popliteal Vein: No evidence of thrombus. Normal compressibility, respiratory phasicity and response to augmentation. Calf Veins: No evidence of thrombus. Normal compressibility and flow on color Doppler imaging. The peroneal vein is not visualized. Superficial Great Saphenous Vein: No evidence of thrombus. Normal compressibility and flow on color Doppler imaging. Venous Reflux:  None. Other Findings: Focal fluid is seen overlying the patella, measuring approximately 4.4 x 1.6 x 3.0 cm, with minimal echoes, likely reflecting prepatellar bursitis. IMPRESSION: 1. No evidence of deep venous thrombosis. 2. Likely prepatellar bursitis, with relatively simple focal fluid collection overlying the patella. Electronically Signed   By: Garald Balding M.D.   On: 06/13/2016 23:10   Dg Foot  Complete Right  Result Date: 06/13/2016 CLINICAL DATA:  55 year old female with fall and right lower extremity pain. EXAM: RIGHT TIBIA AND FIBULA - 2 VIEW; RIGHT FOOT COMPLETE - 3+ VIEW COMPARISON:  Right knee radiograph dated 08/31/2015 FINDINGS: There is no acute fracture or dislocation. The bones are osteopenic. Moderate to severe osteoarthritic changes of the right knee with tricompartmental narrowing and bone spurring similar to prior exam. There is degenerative changes of the tarsometatarsal joints primarily involving the first tarsometatarsal joint. No acute fracture identified. Bone spurring and small bone fragment at the first tarsometatarsal joint, likely chronic. There is diffuse soft tissue swelling of the foot primarily over the dorsal aspect of the foot. No radiopaque foreign object or soft tissue gas identified. There is a 4.9 x 3.1 cm ovoid low attenuating structure in the soft tissues  of the medial aspect of the calf at the level of the mid femoral shaft likely representing an intramuscular lipoma or cyst. There is partial calcification of the tibiofibular interosseous membrane. IMPRESSION: No acute fracture or dislocation. Moderate to severe osteoarthritic changes of the knee. Diffuse subcutaneous soft tissue edema of the left and soft tissue swelling of the foot. Clinical correlation is recommended. Probable intramuscular lipoma or cyst in the medial calf likely in the medial gastrocnemius. Ultrasound is recommended for further evaluation. Electronically Signed   By: Anner Crete M.D.   On: 06/13/2016 23:52    Procedures Procedures (including critical care time)  Medications Ordered in ED Medications - No data to display   Initial Impression / Assessment and Plan / ED Course  I have reviewed the triage vital signs and the nursing notes.  Pertinent labs & imaging results that were available during my care of the patient were reviewed by me and considered in my medical decision  making (see chart for details).  Clinical Course    Vitals:   06/13/16 2158 06/13/16 2200 06/14/16 0004  BP:  124/95 122/81  Pulse:  98 92  Resp:  18 18  Temp:  97.6 F (36.4 C)   TempSrc:  Oral   SpO2:  100% 92%  Weight: 86.2 kg    Height: 4' 10.5" (1.486 m)       Marissa Weber is 55 y.o. female presenting with Lower extremity pain, she is grossly intoxicated on my exam and therefore not appropriate for pain medication. She has significant swelling to the left foot, there is an area of swelling and tenderness on the proximal medial aspect of the left lower extremity, neurovascularly intact. X-rays without acute fracture however there is a likely intramuscular lipoma or cyst in the medial calf, this is likely the area of her swelling.   DVT study is negative however they do note a prepatellar bursitis   Evaluation does not show pathology that would require ongoing emergent intervention or inpatient treatment. Pt is hemodynamically stable and mentating appropriately. Discussed findings and plan with patient/guardian, who agrees with care plan. All questions answered. Return precautions discussed and outpatient follow up given.      Final Clinical Impressions(s) / ED Diagnoses   Final diagnoses:  Fall, initial encounter  Prepatellar bursitis of right knee    New Prescriptions New Prescriptions   HYDROCODONE-ACETAMINOPHEN (NORCO/VICODIN) 5-325 MG TABLET    Take 1-2 tablets by mouth every 6 hours as needed for pain and/or cough.   I personally performed the services described in this documentation, which was scribed in my presence. The recorded information has been reviewed and is accurate.     Monico Blitz, PA-C 06/14/16 0012    Tanna Furry, MD 06/25/16 2117

## 2016-06-13 NOTE — ED Triage Notes (Addendum)
Pt c/o fall x 5 days ago injuring right lower leg . ETOH

## 2016-06-14 MED ORDER — HYDROCODONE-ACETAMINOPHEN 5-325 MG PO TABS: ORAL_TABLET | ORAL | 0 refills | Status: DC

## 2016-06-14 NOTE — ED Notes (Signed)
Pt refused to be assisted by 'you people'.  States that she doesn't need help getting out of the dept.  Pt ambulatory out of the dept and is sitting in the waiting room waiting for her ride to come and pick her up.  Pts daugther was called.

## 2016-06-14 NOTE — Discharge Instructions (Signed)
Take vicodin for breakthrough pain, do not drink alcohol, drive, care for children or do other critical tasks while taking vicodin. ° °Please follow with your primary care doctor in the next 2 days for a check-up. They must obtain records for further management.  ° °Do not hesitate to return to the Emergency Department for any new, worsening or concerning symptoms.  ° °

## 2016-10-17 ENCOUNTER — Emergency Department (HOSPITAL_COMMUNITY): Payer: Medicaid Other

## 2016-10-17 ENCOUNTER — Encounter (HOSPITAL_COMMUNITY): Payer: Self-pay | Admitting: Emergency Medicine

## 2016-10-17 ENCOUNTER — Emergency Department (HOSPITAL_COMMUNITY)
Admission: EM | Admit: 2016-10-17 | Discharge: 2016-10-17 | Disposition: A | Discharge status: Home / Self Care | Payer: Medicaid Other | Attending: Emergency Medicine | Admitting: Emergency Medicine

## 2016-10-17 DIAGNOSIS — Z96641 Presence of right artificial hip joint: Secondary | ICD-10-CM | POA: Diagnosis not present

## 2016-10-17 DIAGNOSIS — J45909 Unspecified asthma, uncomplicated: Secondary | ICD-10-CM | POA: Insufficient documentation

## 2016-10-17 DIAGNOSIS — R072 Precordial pain: Secondary | ICD-10-CM | POA: Diagnosis present

## 2016-10-17 DIAGNOSIS — Z79899 Other long term (current) drug therapy: Secondary | ICD-10-CM | POA: Diagnosis not present

## 2016-10-17 DIAGNOSIS — Z7982 Long term (current) use of aspirin: Secondary | ICD-10-CM | POA: Diagnosis not present

## 2016-10-17 DIAGNOSIS — F17210 Nicotine dependence, cigarettes, uncomplicated: Secondary | ICD-10-CM | POA: Insufficient documentation

## 2016-10-17 DIAGNOSIS — R0789 Other chest pain: Secondary | ICD-10-CM | POA: Diagnosis not present

## 2016-10-17 DIAGNOSIS — I10 Essential (primary) hypertension: Secondary | ICD-10-CM | POA: Diagnosis not present

## 2016-10-17 LAB — COMPREHENSIVE METABOLIC PANEL
ALK PHOS: 115 U/L (ref 38–126)
ALT: 35 U/L (ref 14–54)
ANION GAP: 11 (ref 5–15)
AST: 26 U/L (ref 15–41)
Albumin: 4 g/dL (ref 3.5–5.0)
BILIRUBIN TOTAL: 0.3 mg/dL (ref 0.3–1.2)
BUN: 10 mg/dL (ref 6–20)
CALCIUM: 10.2 mg/dL (ref 8.9–10.3)
CO2: 23 mmol/L (ref 22–32)
CREATININE: 0.7 mg/dL (ref 0.44–1.00)
Chloride: 103 mmol/L (ref 101–111)
GFR calc non Af Amer: >60 mL/min (ref >60)
GLUCOSE: 108 mg/dL — AB (ref 65–99)
Potassium: 3.7 mmol/L (ref 3.5–5.1)
SODIUM: 137 mmol/L (ref 135–145)
TOTAL PROTEIN: 7.6 g/dL (ref 6.5–8.1)

## 2016-10-17 LAB — CBC WITH DIFFERENTIAL/PLATELET
Basophils Absolute: 0.1 10*3/uL (ref 0.0–0.1)
Basophils Relative: 1 %
Eosinophils Absolute: 0.2 10*3/uL (ref 0.0–0.7)
Eosinophils Relative: 2 %
HCT: 41.2 % (ref 36.0–46.0)
HEMOGLOBIN: 13 g/dL (ref 12.0–15.0)
LYMPHS ABS: 2.1 10*3/uL (ref 0.7–4.0)
LYMPHS PCT: 21 %
MCH: 22.5 pg — AB (ref 26.0–34.0)
MCHC: 31.6 g/dL (ref 30.0–36.0)
MCV: 71.3 fL — ABNORMAL LOW (ref 78.0–100.0)
MONOS PCT: 10 %
Monocytes Absolute: 1 10*3/uL (ref 0.1–1.0)
NEUTROS PCT: 66 %
Neutro Abs: 6.6 10*3/uL (ref 1.7–7.7)
Platelets: 225 10*3/uL (ref 150–400)
RBC: 5.78 MIL/uL — AB (ref 3.87–5.11)
RDW: 20.6 % — ABNORMAL HIGH (ref 11.5–15.5)
WBC: 10 10*3/uL (ref 4.0–10.5)

## 2016-10-17 LAB — I-STAT TROPONIN, ED: Troponin i, poc: 0 ng/mL (ref 0.00–0.08)

## 2016-10-17 MED ORDER — TRAMADOL HCL 50 MG PO TABS: 50.0000 mg | ORAL_TABLET | Freq: Four times a day (QID) | ORAL | 0 refills | Status: DC | PRN

## 2016-10-17 MED ORDER — OXYCODONE-ACETAMINOPHEN 5-325 MG PO TABS
1.0000 | ORAL_TABLET | Freq: Once | ORAL | Status: AC
  Administered 2016-10-17: 1 via ORAL
  Filled 2016-10-17: qty 1

## 2016-10-17 NOTE — Discharge Instructions (Signed)
Follow-up with your family doctor next week. Get seen sooner if not improving

## 2016-10-17 NOTE — ED Triage Notes (Signed)
Per EMS pt has CP 2 weeks ago, R sided, gotten a lot worse today, pt has relief with 2 nitro, now pain is 7/10. 324 aspirin

## 2016-10-17 NOTE — ED Notes (Signed)
Patient transported to X-ray 

## 2016-10-17 NOTE — ED Provider Notes (Signed)
[facility] DEPT Provider Note   CSN: [id number] Arrival date & time: 10/17/16  1732     History   Chief Complaint Chief Complaint  Patient presents with  . Chest Pain    HPI Marissa Weber is a 56 y.o. female.  Patient complaining of substernal chest pain that worse with movement of right arm moving around the bed.   The history is provided by the patient.  Chest Pain   The current episode started more than 2 days ago. The problem occurs daily. The problem has been gradually improving. The pain is associated with movement. The pain is present in the substernal region. The pain is at a severity of 7/10. The pain is moderate. The quality of the pain is described as sharp. Pertinent negatives include no abdominal pain, no back pain, no cough and no headaches.  Pertinent negatives for past medical history include no seizures.    Past Medical History:  Diagnosis Date  . Arthritis    hip  and knees   . Asthma    hx of years ago   . Blood transfusion    hx of years ago   . Cholecystitis   . Hypertension     Patient Active Problem List   Diagnosis Date Noted  . Angioedema 04/16/2016  . Cholecystitis, acute with cholelithiasis 12/11/2013  . Postop Acute blood loss anemia 10/25/2011  . Postop Hyponatremia 10/25/2011  . Osteoarthritis of hip 10/22/2011    Past Surgical History:  Procedure Laterality Date  . ANKLE SURGERY    . BACK SURGERY     SPINAL FUSION  . CESAREAN SECTION    . CHOLECYSTECTOMY N/A 12/11/2013   Procedure: LAPAROSCOPIC CHOLECYSTECTOMY WITH INTRAOPERATIVE CHOLANGIOGRAM;  Surgeon: Imogene Burn. Georgette Dover, MD;  Location: [facility];  Service: General;  Laterality: N/A;  . TOTAL HIP ARTHROPLASTY  10/22/2011   Procedure: TOTAL HIP ARTHROPLASTY;  Surgeon: Gearlean Alf, MD;  Location: [facility];  Service: Orthopedics;  Laterality: Right;    OB History    Gravida Para Term Preterm AB Living   4 4 4     5    SAB TAB Ectopic Multiple Live Births                    Home Medications    Prior to Admission medications   Medication Sig Start Date End Date Taking? Authorizing Provider  amLODipine (NORVASC) 5 MG tablet Take 5 mg by mouth daily.   Yes Historical Provider, MD  Aspirin-Acetaminophen-Caffeine (EXCEDRIN EXTRA STRENGTH PO) Take 1 capsule by mouth daily as needed (pain).   Yes Historical Provider, MD  HYDROcodone-acetaminophen (NORCO/VICODIN) 5-325 MG tablet Take 1-2 tablets by mouth every 6 hours as needed for pain and/or cough. 06/14/16  Yes Nicole Pisciotta, PA-C  PROAIR HFA 108 (90 Base) MCG/ACT inhaler INHALE 2 PUFFS 4 TIMES A DAY AS NEEDED SHORTNESS OF BREATH / WHEEZING 09/29/15  Yes Historical Provider, MD  tiZANidine (ZANAFLEX) 2 MG tablet Take 1 tablet (2 mg total) by mouth at bedtime. 04/17/16  Yes [name], MD  hydrALAZINE (APRESOLINE) 50 MG tablet Take 1 tablet (50 mg total) by mouth every 8 (eight) hours. Patient not taking: Reported on 10/17/2016 04/17/16   [name], MD  traMADol (ULTRAM) 50 MG tablet Take 1 tablet (50 mg total) by mouth every 6 (six) hours as needed. 10/17/16   Milton Ferguson, MD    Family History No family history on file.  Social History Social History  Substance Use Topics  .  Smoking status: Current Every Day Smoker    Packs/day: 0.50    Years: 10.00    Types: Cigarettes  . Smokeless tobacco: Never Used  . Alcohol use 2.4 oz/week    4 Cans of beer per week     Allergies   Bee venom and Lisinopril   Review of Systems Review of Systems  Constitutional: Negative for appetite change and fatigue.  HENT: Negative for congestion, ear discharge and sinus pressure.   Eyes: Negative for discharge.  Respiratory: Negative for cough.   Cardiovascular: Positive for chest pain.  Gastrointestinal: Negative for abdominal pain and diarrhea.  Genitourinary: Negative for frequency and hematuria.  Musculoskeletal: Negative for back pain.  Skin: Negative for rash.  Neurological: Negative for seizures and  headaches.  Psychiatric/Behavioral: Negative for hallucinations.     Physical Exam Updated Vital Signs BP (!) 154/101   Pulse 84   Temp 97.8 F (36.6 C) (Oral)   Resp 22   SpO2 98%   Physical Exam  Constitutional: She is oriented to person, place, and time. She appears well-developed.  HENT:  Head: Normocephalic.  Eyes: Conjunctivae and EOM are normal. No scleral icterus.  Neck: Neck supple. No thyromegaly present.  Cardiovascular: Normal rate and regular rhythm.  Exam reveals no gallop and no friction rub.   No murmur heard. Pulmonary/Chest: No stridor. She has no wheezes. She has no rales. She exhibits tenderness.  Abdominal: She exhibits no distension. There is no tenderness. There is no rebound.  Musculoskeletal: Normal range of motion. She exhibits no edema.  Lymphadenopathy:    She has no cervical adenopathy.  Neurological: She is oriented to person, place, and time. She exhibits normal muscle tone. Coordination normal.  Skin: No rash noted. No erythema.  Psychiatric: She has a normal mood and affect. Her behavior is normal.     ED Treatments / Results  Labs (all labs ordered are listed, but only abnormal results are displayed) Labs Reviewed  CBC WITH DIFFERENTIAL/PLATELET - Abnormal; Notable for the following:       Result Value   RBC 5.78 (*)    MCV 71.3 (*)    MCH 22.5 (*)    RDW 20.6 (*)    All other components within normal limits  COMPREHENSIVE METABOLIC PANEL - Abnormal; Notable for the following:    Glucose, Bld 108 (*)    All other components within normal limits  I-STAT TROPOININ, ED    EKG  EKG Interpretation  Date/Time:  Wednesday October 17 2016 17:50:44 EST Ventricular Rate:  93 PR Interval:    QRS Duration: 99 QT Interval:  378 QTC Calculation: 471 R Axis:   9 Text Interpretation:  Sinus rhythm Prolonged PR interval Probable left atrial enlargement Probable inferior infarct, old Probable anterolateral infarct, old Confirmed by Shizuo Biskup   MD, Obrian Bulson [phone]) on 10/17/2016 7:36:27 PM       Radiology Dg Chest 2 View  Result Date: 10/17/2016 CLINICAL DATA:  Subacute onset of right upper chest pain, extending posteriorly. Initial encounter. EXAM: CHEST  2 VIEW COMPARISON:  Chest radiograph performed 08/31/2015 FINDINGS: The lungs are well-aerated. Mild bibasilar opacities may reflect scarring or possibly mild infection. There is no evidence of pleural effusion or pneumothorax. The heart is normal in size; the mediastinal contour is within normal limits. No acute osseous abnormalities are seen. IMPRESSION: Mild bibasilar airspace opacities may reflect scarring or possibly mild infection. Electronically Signed   By: Garald Balding M.D.   On: 10/17/2016 18:27  Procedures Procedures (including critical care time)  Medications Ordered in ED Medications  oxyCODONE-acetaminophen (PERCOCET/ROXICET) 5-325 MG per tablet 1 tablet (1 tablet Oral Given 10/17/16 1818)     Initial Impression / Assessment and Plan / ED Course  I have reviewed the triage vital signs and the nursing notes.  Pertinent labs & imaging results that were available during my care of the patient were reviewed by me and considered in my medical decision making (see chart for details).     Labs unremarkable. Chest pain is more related to movement and palpation. Suspect chest wall discomfort patient will be given Ultram and will follow-up her PCP  Final Clinical Impressions(s) / ED Diagnoses   Final diagnoses:  Chest wall pain    New Prescriptions New Prescriptions   TRAMADOL (ULTRAM) 50 MG TABLET    Take 1 tablet (50 mg total) by mouth every 6 (six) hours as needed.     Milton Ferguson, MD 10/17/16 [phone]

## 2016-11-22 ENCOUNTER — Ambulatory Visit: Payer: Self-pay | Admitting: Orthopedic Surgery

## 2016-11-25 DIAGNOSIS — I4891 Unspecified atrial fibrillation: Secondary | ICD-10-CM

## 2016-11-25 HISTORY — DX: Unspecified atrial fibrillation: I48.91

## 2016-11-27 ENCOUNTER — Observation Stay (HOSPITAL_COMMUNITY): Payer: Medicaid Other

## 2016-11-27 ENCOUNTER — Other Ambulatory Visit: Payer: Self-pay | Admitting: Orthopedic Surgery

## 2016-11-27 ENCOUNTER — Inpatient Hospital Stay (HOSPITAL_COMMUNITY)
Admission: AD | Admit: 2016-11-27 | Discharge: 2016-11-30 | DRG: 287 | Disposition: A | Discharge status: Home / Self Care | Payer: Medicaid Other | Source: Ambulatory Visit | Attending: Cardiovascular Disease | Admitting: Cardiovascular Disease

## 2016-11-27 DIAGNOSIS — Z96641 Presence of right artificial hip joint: Secondary | ICD-10-CM | POA: Diagnosis present

## 2016-11-27 DIAGNOSIS — Z6839 Body mass index (BMI) 39.0-39.9, adult: Secondary | ICD-10-CM

## 2016-11-27 DIAGNOSIS — F17210 Nicotine dependence, cigarettes, uncomplicated: Secondary | ICD-10-CM | POA: Diagnosis present

## 2016-11-27 DIAGNOSIS — Z9889 Other specified postprocedural states: Secondary | ICD-10-CM

## 2016-11-27 DIAGNOSIS — R0789 Other chest pain: Secondary | ICD-10-CM | POA: Diagnosis present

## 2016-11-27 DIAGNOSIS — I10 Essential (primary) hypertension: Secondary | ICD-10-CM | POA: Diagnosis present

## 2016-11-27 DIAGNOSIS — Z981 Arthrodesis status: Secondary | ICD-10-CM

## 2016-11-27 DIAGNOSIS — Z9049 Acquired absence of other specified parts of digestive tract: Secondary | ICD-10-CM

## 2016-11-27 DIAGNOSIS — I480 Paroxysmal atrial fibrillation: Principal | ICD-10-CM | POA: Diagnosis present

## 2016-11-27 DIAGNOSIS — F419 Anxiety disorder, unspecified: Secondary | ICD-10-CM | POA: Diagnosis present

## 2016-11-27 DIAGNOSIS — M549 Dorsalgia, unspecified: Secondary | ICD-10-CM | POA: Diagnosis present

## 2016-11-27 DIAGNOSIS — M169 Osteoarthritis of hip, unspecified: Secondary | ICD-10-CM | POA: Diagnosis present

## 2016-11-27 DIAGNOSIS — I4891 Unspecified atrial fibrillation: Secondary | ICD-10-CM | POA: Diagnosis present

## 2016-11-27 DIAGNOSIS — M1612 Unilateral primary osteoarthritis, left hip: Secondary | ICD-10-CM | POA: Diagnosis present

## 2016-11-27 DIAGNOSIS — E669 Obesity, unspecified: Secondary | ICD-10-CM | POA: Diagnosis present

## 2016-11-27 DIAGNOSIS — R079 Chest pain, unspecified: Secondary | ICD-10-CM

## 2016-11-27 DIAGNOSIS — M25552 Pain in left hip: Secondary | ICD-10-CM | POA: Diagnosis present

## 2016-11-27 DIAGNOSIS — Z888 Allergy status to other drugs, medicaments and biological substances status: Secondary | ICD-10-CM

## 2016-11-27 DIAGNOSIS — Z79899 Other long term (current) drug therapy: Secondary | ICD-10-CM

## 2016-11-27 HISTORY — DX: Unspecified atrial fibrillation: I48.91

## 2016-11-27 LAB — CBC WITH DIFFERENTIAL/PLATELET
BASOS ABS: 0.1 10*3/uL (ref 0.0–0.1)
BASOS PCT: 1 %
EOS ABS: 0.2 10*3/uL (ref 0.0–0.7)
Eosinophils Relative: 3 %
HCT: 38.6 % (ref 36.0–46.0)
HEMOGLOBIN: 12.4 g/dL (ref 12.0–15.0)
LYMPHS PCT: 28 %
Lymphs Abs: 1.9 10*3/uL (ref 0.7–4.0)
MCH: 22.7 pg — AB (ref 26.0–34.0)
MCHC: 32.1 g/dL (ref 30.0–36.0)
MCV: 70.7 fL — ABNORMAL LOW (ref 78.0–100.0)
MONO ABS: 0.6 10*3/uL (ref 0.1–1.0)
Monocytes Relative: 9 %
NEUTROS PCT: 59 %
Neutro Abs: 3.9 10*3/uL (ref 1.7–7.7)
PLATELETS: 299 10*3/uL (ref 150–400)
RBC: 5.46 MIL/uL — ABNORMAL HIGH (ref 3.87–5.11)
RDW: 18.8 % — ABNORMAL HIGH (ref 11.5–15.5)
WBC: 6.7 10*3/uL (ref 4.0–10.5)

## 2016-11-27 LAB — COMPREHENSIVE METABOLIC PANEL
ALK PHOS: 235 U/L — AB (ref 38–126)
ALT: 70 U/L — AB (ref 14–54)
ANION GAP: 14 (ref 5–15)
AST: 103 U/L — ABNORMAL HIGH (ref 15–41)
Albumin: 4 g/dL (ref 3.5–5.0)
BILIRUBIN TOTAL: 0.4 mg/dL (ref 0.3–1.2)
BUN: 5 mg/dL — ABNORMAL LOW (ref 6–20)
CALCIUM: 9 mg/dL (ref 8.9–10.3)
CO2: 23 mmol/L (ref 22–32)
CREATININE: 0.75 mg/dL (ref 0.44–1.00)
Chloride: 99 mmol/L — ABNORMAL LOW (ref 101–111)
Glucose, Bld: 108 mg/dL — ABNORMAL HIGH (ref 65–99)
Potassium: 4.2 mmol/L (ref 3.5–5.1)
SODIUM: 136 mmol/L (ref 135–145)
TOTAL PROTEIN: 7.6 g/dL (ref 6.5–8.1)

## 2016-11-27 LAB — TSH: TSH: 1.263 u[IU]/mL (ref 0.350–4.500)

## 2016-11-27 MED ORDER — HYDROCODONE-ACETAMINOPHEN 5-325 MG PO TABS
1.0000 | ORAL_TABLET | ORAL | Status: DC | PRN
  Administered 2016-11-27 – 2016-11-30 (×7): 2 via ORAL
  Filled 2016-11-27 (×8): qty 2

## 2016-11-27 MED ORDER — ALBUTEROL SULFATE (2.5 MG/3ML) 0.083% IN NEBU: 2.5000 mg | INHALATION_SOLUTION | RESPIRATORY_TRACT | Status: DC | PRN

## 2016-11-27 MED ORDER — AMLODIPINE BESYLATE 5 MG PO TABS: 5.0000 mg | ORAL_TABLET | Freq: Every day | ORAL | Status: DC

## 2016-11-27 MED ORDER — HEPARIN SODIUM (PORCINE) 5000 UNIT/ML IJ SOLN
5000.0000 [IU] | Freq: Three times a day (TID) | INTRAMUSCULAR | Status: DC
  Administered 2016-11-27 – 2016-11-30 (×7): 5000 [IU] via SUBCUTANEOUS
  Filled 2016-11-27 (×7): qty 1

## 2016-11-27 MED ORDER — METOPROLOL TARTRATE 25 MG PO TABS
25.0000 mg | ORAL_TABLET | Freq: Two times a day (BID) | ORAL | Status: DC
  Administered 2016-11-27 – 2016-11-30 (×6): 25 mg via ORAL
  Filled 2016-11-27 (×6): qty 1

## 2016-11-27 MED ORDER — DILTIAZEM HCL 30 MG PO TABS
30.0000 mg | ORAL_TABLET | Freq: Four times a day (QID) | ORAL | Status: DC
  Administered 2016-11-27 – 2016-11-30 (×12): 30 mg via ORAL
  Filled 2016-11-27 (×12): qty 1

## 2016-11-27 MED ORDER — ONDANSETRON HCL 4 MG/2ML IJ SOLN
4.0000 mg | Freq: Four times a day (QID) | INTRAMUSCULAR | Status: DC | PRN
  Administered 2016-11-28: 4 mg via INTRAVENOUS
  Filled 2016-11-27: qty 2

## 2016-11-27 MED ORDER — DOCUSATE SODIUM 100 MG PO CAPS
100.0000 mg | ORAL_CAPSULE | Freq: Two times a day (BID) | ORAL | Status: DC
  Administered 2016-11-27 – 2016-11-29 (×5): 100 mg via ORAL
  Filled 2016-11-27 (×6): qty 1

## 2016-11-27 MED ORDER — ONDANSETRON HCL 4 MG PO TABS
4.0000 mg | ORAL_TABLET | Freq: Four times a day (QID) | ORAL | Status: DC | PRN
Start: 2016-11-27 — End: 2016-11-30

## 2016-11-27 NOTE — H&P (Signed)
Referring Physician: [name], MD  Marissa Weber is an 56 y.o. female.                       Chief Complaint: Palpitation  HPI: 56 year old female with increasing left hip pain has palpitation for 1 day. No chest pain or fever. EKG in office showed atrial fibrillation with RVR.  Past Medical History:  Diagnosis Date  . Arthritis    hip  and knees   . Asthma    hx of years ago   . Blood transfusion    hx of years ago   . Cholecystitis   . Hypertension       Past Surgical History:  Procedure Laterality Date  . ANKLE SURGERY    . BACK SURGERY     SPINAL FUSION  . CESAREAN SECTION    . CHOLECYSTECTOMY N/A 12/11/2013   Procedure: LAPAROSCOPIC CHOLECYSTECTOMY WITH INTRAOPERATIVE CHOLANGIOGRAM;  Surgeon: Imogene Burn. Georgette Dover, MD;  Location: [facility];  Service: General;  Laterality: N/A;  . TOTAL HIP ARTHROPLASTY  10/22/2011   Procedure: TOTAL HIP ARTHROPLASTY;  Surgeon: Gearlean Alf, MD;  Location: [facility];  Service: Orthopedics;  Laterality: Right;    No family history on file. Social History:  reports that she has been smoking Cigarettes.  She has a 5.00 pack-year smoking history. She has never used smokeless tobacco. She reports that she drinks about 2.4 oz of alcohol per week . She reports that she does not use drugs.  Allergies:  Allergies  Allergen Reactions  . Bee Venom Swelling    Swelling wherever the sting is. Also gets dizzy  . Lisinopril     swelling    Medications Prior to Admission  Medication Sig Dispense Refill  . albuterol (PROVENTIL HFA;VENTOLIN HFA) 108 (90 Base) MCG/ACT inhaler Inhale 2 puffs into the lungs every 4 (four) hours as needed for wheezing or shortness of breath.    Marland Kitchen amLODipine (NORVASC) 5 MG tablet Take 5 mg by mouth daily.    . Aspirin-Acetaminophen-Caffeine (EXCEDRIN EXTRA STRENGTH PO) Take 1 capsule by mouth daily as needed (pain).    . traMADol (ULTRAM) 50 MG tablet Take 1 tablet (50 mg total) by mouth every 6 (six) hours as needed. 20  tablet 0    No results found for this or any previous visit (from the past 48 hour(s)). No results found.  Review Of Systems Constitutional: No fever, chills, weight loss or gain. Eyes: No vision change, wears glasses. No discharge or pain. Ears: No hearing loss, No tinnitus. Respiratory: Positive asthma, COPD, pneumonias. No shortness of breath. No hemoptysis. Cardiovascular: Occasional chest pain, palpitation, leg edema. Gastrointestinal: No nausea, vomiting, diarrhea, constipation. No GI bleed. No hepatitis. Genitourinary: No dysuria, hematuria, kidney stone. No incontinance. Neurological: No headache, stroke, seizures.  Psychiatry: No psych facility admission for anxiety, depression, suicide. No detox. Skin: No rash. Musculoskeletal: Positive joint pain, fibromyalgia. No neck pain. Positive back pain. Lymphadenopathy: No lymphadenopathy. Hematology: No anemia or easy bruising.   There were no vitals taken for this visit. There is no height or weight on file to calculate BMI. General appearance: alert, cooperative, appears stated age and mild distress Head: Normocephalic, atraumatic. Eyes: Brown eyes, pink conjunctiva, corneas clear. PERRL, EOM's intact. Neck: No adenopathy, no carotid bruit, no JVD, supple, symmetrical, trachea midline and thyroid not enlarged. Resp: Clear to auscultation bilaterally. Cardio: Irregular rate and rhythm, S1, S2 normal, II/VI systolic murmur, no click, rub or gallop  GI: Soft, non-tender; bowel sounds normal; no organomegaly. Extremities: No edema, cyanosis or clubbing. Left hip tenderness. Skin: Warm and dry.  Neurologic: Alert and oriented X 3, normal strength. Normal coordination and slow gait with cane use.  Assessment/Plan Atrial fibrillation with RVR Back pain Hip pain Obesity  Place in observation. PO diltiazem/ metoprolol Telemetry. Echocardiogram in AM.  Birdie Riddle, MD  11/27/2016, 8:15 PM

## 2016-11-28 ENCOUNTER — Encounter (HOSPITAL_COMMUNITY): Payer: Self-pay | Admitting: *Deleted

## 2016-11-28 ENCOUNTER — Observation Stay (HOSPITAL_COMMUNITY): Payer: Medicaid Other

## 2016-11-28 LAB — ECHOCARDIOGRAM COMPLETE
AVLVOTPG: 6 mmHg
CHL CUP DOP CALC LVOT VTI: 31.1 cm
CHL CUP MV DEC (S): 229
E/e' ratio: 15.63
EWDT: 229 ms
FS: 36 % (ref 28–44)
HEIGHTINCHES: 59 in
IV/PV OW: 1.19
LA diam index: 2.04 cm/m2
LA vol A4C: 76.7 ml
LA vol: 76.9 mL
LASIZE: 40 mm
LAVOLIN: 39.2 mL/m2
LDCA: 4.52 cm2
LEFT ATRIUM END SYS DIAM: 40 mm
LV E/e' medial: 15.63
LV E/e'average: 15.63
LV PW d: 11.8 mm — AB (ref 0.6–1.1)
LV TDI E'LATERAL: 8.38
LV TDI E'MEDIAL: 6.64
LV e' LATERAL: 8.38 cm/s
LVOT SV: 141 mL
LVOT diameter: 24 mm
LVOTPV: 122 cm/s
Lateral S' vel: 14.9 cm/s
MV Peak grad: 7 mmHg
MV pk A vel: 96.2 m/s
MV pk E vel: 131 m/s
TAPSE: 30.8 mm
WEIGHTICAEL: 3089.6 [oz_av]

## 2016-11-28 LAB — BASIC METABOLIC PANEL
Anion gap: 9 (ref 5–15)
BUN: 5 mg/dL — AB (ref 6–20)
CALCIUM: 9.4 mg/dL (ref 8.9–10.3)
CO2: 30 mmol/L (ref 22–32)
CREATININE: 0.66 mg/dL (ref 0.44–1.00)
Chloride: 96 mmol/L — ABNORMAL LOW (ref 101–111)
GFR calc Af Amer: >60 mL/min (ref >60)
GFR calc non Af Amer: >60 mL/min (ref >60)
Glucose, Bld: 106 mg/dL — ABNORMAL HIGH (ref 65–99)
Potassium: 3.8 mmol/L (ref 3.5–5.1)
Sodium: 135 mmol/L (ref 135–145)

## 2016-11-28 LAB — CBC
HCT: 39 % (ref 36.0–46.0)
Hemoglobin: 12.2 g/dL (ref 12.0–15.0)
MCH: 22.2 pg — ABNORMAL LOW (ref 26.0–34.0)
MCHC: 31.3 g/dL (ref 30.0–36.0)
MCV: 71 fL — ABNORMAL LOW (ref 78.0–100.0)
Platelets: 249 10*3/uL (ref 150–400)
RBC: 5.49 MIL/uL — ABNORMAL HIGH (ref 3.87–5.11)
RDW: 18.7 % — ABNORMAL HIGH (ref 11.5–15.5)
WBC: 6.8 10*3/uL (ref 4.0–10.5)

## 2016-11-28 LAB — HIV ANTIBODY (ROUTINE TESTING W REFLEX): HIV Screen 4th Generation wRfx: NONREACTIVE

## 2016-11-28 LAB — MRSA PCR SCREENING: MRSA BY PCR: NEGATIVE

## 2016-11-28 NOTE — Patient Instructions (Signed)
Marissa Weber  11/28/2016   Your procedure is scheduled on: 12/05/2016    Report to [facility] Main  Entrance and check in at Admitting  at   1015 AM.  Call this number if you have problems the morning of surgery [phone]   Remember: ONLY 1 PERSON MAY GO WITH YOU TO SHORT STAY TO GET  READY MORNING OF [place].  Do not eat food or drink liquids :After Midnight.     Take these medicines the morning of surgery with A SIP OF WATER:                                 You may not have any metal on your body including hair pins and              piercings  Do not wear jewelry, make-up, lotions, powders or perfumes, deodorant             Do not wear nail polish.  Do not shave  48 hours prior to surgery.                Do not bring valuables to the hospital. [place].  Contacts, dentures or bridgework may not be worn into surgery.  Leave suitcase in the car. After surgery it may be brought to your room.       Special Instructions: N/A              Please read over the following fact sheets you were given: _____________________________________________________________________             [facility] - Preparing for Surgery Before surgery, you can play an important role.  Because skin is not sterile, your skin needs to be as free of germs as possible.  You can reduce the number of germs on your skin by washing with CHG (chlorahexidine gluconate) soap before surgery.  CHG is an antiseptic cleaner which kills germs and bonds with the skin to continue killing germs even after washing. Please DO NOT use if you have an allergy to CHG or antibacterial soaps.  If your skin becomes reddened/irritated stop using the CHG and inform your nurse when you arrive at Short Stay. Do not shave (including legs and underarms) for at least 48 hours prior to the first CHG shower.  You may shave your face/neck. Please follow these  instructions carefully:  1.  Shower with CHG Soap the night before surgery and the  morning of Surgery.  2.  If you choose to wash your hair, wash your hair first as usual with your  normal  shampoo.  3.  After you shampoo, rinse your hair and body thoroughly to remove the  shampoo.                           4.  Use CHG as you would any other liquid soap.  You can apply chg directly  to the skin and wash                       Gently with a scrungie or clean washcloth.  5.  Apply the CHG Soap to your body  ONLY FROM THE NECK DOWN.   Do not use on face/ open                           Wound or open sores. Avoid contact with eyes, ears mouth and genitals (private parts).                       Wash face,  Genitals (private parts) with your normal soap.             6.  Wash thoroughly, paying special attention to the area where your surgery  will be performed.  7.  Thoroughly rinse your body with warm water from the neck down.  8.  DO NOT shower/wash with your normal soap after using and rinsing off  the CHG Soap.                9.  Pat yourself dry with a clean towel.            10.  Wear clean pajamas.            11.  Place clean sheets on your bed the night of your first shower and do not  sleep with pets. Day of Surgery : Do not apply any lotions/deodorants the morning of surgery.  Please wear clean clothes to the hospital/surgery center.  FAILURE TO FOLLOW THESE INSTRUCTIONS MAY RESULT IN THE CANCELLATION OF YOUR SURGERY PATIENT SIGNATURE_________________________________  NURSE SIGNATURE__________________________________  ________________________________________________________________________  WHAT IS A BLOOD TRANSFUSION? Blood Transfusion Information  A transfusion is the replacement of blood or some of its parts. Blood is made up of multiple cells which provide different functions.  Red blood cells carry oxygen and are used for blood loss replacement.  White blood cells fight against  infection.  Platelets control bleeding.  Plasma helps clot blood.  Other blood products are available for specialized needs, such as hemophilia or other clotting disorders. BEFORE THE TRANSFUSION  Who gives blood for transfusions?   Healthy volunteers who are fully evaluated to make sure their blood is safe. This is blood bank blood. Transfusion therapy is the safest it has ever been in the practice of medicine. Before blood is taken from a donor, a complete history is taken to make sure that person has no history of diseases nor engages in risky social behavior (examples are intravenous drug use or sexual activity with multiple partners). The donor's travel history is screened to minimize risk of transmitting infections, such as malaria. The donated blood is tested for signs of infectious diseases, such as HIV and hepatitis. The blood is then tested to be sure it is compatible with you in order to minimize the chance of a transfusion reaction. If you or a relative donates blood, this is often done in anticipation of surgery and is not appropriate for emergency situations. It takes many days to process the donated blood. RISKS AND COMPLICATIONS Although transfusion therapy is very safe and saves many lives, the main dangers of transfusion include:   Getting an infectious disease.  Developing a transfusion reaction. This is an allergic reaction to something in the blood you were given. Every precaution is taken to prevent this. The decision to have a blood transfusion has been considered carefully by your caregiver before blood is given. Blood is not given unless the benefits outweigh the risks. AFTER THE TRANSFUSION  Right after receiving a blood transfusion, you will usually feel much  better and more energetic. This is especially true if your red blood cells have gotten low (anemic). The transfusion raises the level of the red blood cells which carry oxygen, and this usually causes an energy  increase.  The nurse administering the transfusion will monitor you carefully for complications. HOME CARE INSTRUCTIONS  No special instructions are needed after a transfusion. You may find your energy is better. Speak with your caregiver about any limitations on activity for underlying diseases you may have. SEEK MEDICAL CARE IF:   Your condition is not improving after your transfusion.  You develop redness or irritation at the intravenous (IV) site. SEEK IMMEDIATE MEDICAL CARE IF:  Any of the following symptoms occur over the next 12 hours:  Shaking chills.  You have a temperature by mouth above 102 F (38.9 C), not controlled by medicine.  Chest, back, or muscle pain.  People around you feel you are not acting correctly or are confused.  Shortness of breath or difficulty breathing.  Dizziness and fainting.  You get a rash or develop hives.  You have a decrease in urine output.  Your urine turns a dark color or changes to pink, red, or brown. Any of the following symptoms occur over the next 10 days:  You have a temperature by mouth above 102 F (38.9 C), not controlled by medicine.  Shortness of breath.  Weakness after normal activity.  The white part of the eye turns yellow (jaundice).  You have a decrease in the amount of urine or are urinating less often.  Your urine turns a dark color or changes to pink, red, or brown. Document Released: 08/10/2000 Document Revised: 11/05/2011 Document Reviewed: 03/29/2008 ExitCare Patient Information 2014 [place].  _______________________________________________________________________  Incentive Spirometer  An incentive spirometer is a tool that can help keep your lungs clear and active. This tool measures how well you are filling your lungs with each breath. Taking long deep breaths may help reverse or decrease the chance of developing breathing (pulmonary) problems (especially infection) following:  A long  period of time when you are unable to move or be active. BEFORE THE PROCEDURE   If the spirometer includes an indicator to show your best effort, your nurse or respiratory therapist will set it to a desired goal.  If possible, sit up straight or lean slightly forward. Try not to slouch.  Hold the incentive spirometer in an upright position. INSTRUCTIONS FOR USE  1. Sit on the edge of your bed if possible, or sit up as far as you can in bed or on a chair. 2. Hold the incentive spirometer in an upright position. 3. Breathe out normally. 4. Place the mouthpiece in your mouth and seal your lips tightly around it. 5. Breathe in slowly and as deeply as possible, raising the piston or the ball toward the top of the column. 6. Hold your breath for 3-5 seconds or for as long as possible. Allow the piston or ball to fall to the bottom of the column. 7. Remove the mouthpiece from your mouth and breathe out normally. 8. Rest for a few seconds and repeat Steps 1 through 7 at least 10 times every 1-2 hours when you are awake. Take your time and take a few normal breaths between deep breaths. 9. The spirometer may include an indicator to show your best effort. Use the indicator as a goal to work toward during each repetition. 10. After each set of 10 deep breaths, practice coughing to be sure  your lungs are clear. If you have an incision (the cut made at the time of surgery), support your incision when coughing by placing a pillow or rolled up towels firmly against it. Once you are able to get out of bed, walk around indoors and cough well. You may stop using the incentive spirometer when instructed by your caregiver.  RISKS AND COMPLICATIONS  Take your time so you do not get dizzy or light-headed.  If you are in pain, you may need to take or ask for pain medication before doing incentive spirometry. It is harder to take a deep breath if you are having pain. AFTER USE  Rest and breathe slowly and  easily.  It can be helpful to keep track of a log of your progress. Your caregiver can provide you with a simple table to help with this. If you are using the spirometer at home, follow these instructions: [place] IF:   You are having difficultly using the spirometer.  You have trouble using the spirometer as often as instructed.  Your pain medication is not giving enough relief while using the spirometer.  You develop fever of 100.5 F (38.1 C) or higher. SEEK IMMEDIATE MEDICAL CARE IF:   You cough up bloody sputum that had not been present before.  You develop fever of 102 F (38.9 C) or greater.  You develop worsening pain at or near the incision site. MAKE SURE YOU:   Understand these instructions.  Will watch your condition.  Will get help right away if you are not doing well or get worse. Document Released: 12/24/2006 Document Revised: 11/05/2011 Document Reviewed: 02/24/2007 [facility] : [facility] Patient Information 2014 [place], [place].   ________________________________________________________________________

## 2016-11-28 NOTE — Progress Notes (Signed)
Ref: Marissa Riddle, MD   Subjective:  Feeling better. Echocardiogram with normal LV systolic function and mild diastolic dysfunction. Monitor shows sinus rhythm. Occasional chest pain with exertion. Awaiting left hip surgery next week.  Objective:  Vital Signs in the last 24 hours: Temp:  [97.7 F (36.5 C)-98.7 F (37.1 C)] 98.7 F (37.1 C) (04/04 1200) Pulse Rate:  [66-87] 66 (04/04 1200) Cardiac Rhythm: Normal sinus rhythm (04/04 0700) Resp:  [18] 18 (04/04 1200) BP: (118-139)/(72-79) 118/78 (04/04 1734) SpO2:  [94 %-100 %] 94 % (04/04 1200) Weight:  [87.4 kg (192 lb 9.6 oz)-87.6 kg (193 lb 1.6 oz)] 87.6 kg (193 lb 1.6 oz) (04/04 0443)  Physical Exam: BP Readings from Last 1 Encounters:  11/28/16 118/78    Wt Readings from Last 1 Encounters:  11/28/16 87.6 kg (193 lb 1.6 oz)    Weight change:  Body mass index is 39 kg/m. HEENT: [place]/AT, Eyes-Brown, PERL, EOMI, Conjunctiva-Pink, Sclera-Non-icteric Neck: No JVD, No bruit, Trachea midline. Lungs:  Clear, Bilateral. Cardiac:  Regular rhythm, normal S1 and S2, no S3. II/VI systolic murmur. Abdomen:  Soft, non-tender. BS present. Extremities:  No edema present. No cyanosis. No clubbing. Left hip tenderness. CNS: AxOx3, Cranial nerves grossly intact, moves all 4 extremities. Slow gait. Skin: Warm and dry.   Intake/Output from previous day: 04/03 0701 - 04/04 0700 In: 480 [P.O.:480] Out: 600 [Urine:600]    Lab Results: BMET    Component Value Date/Time   NA 135 11/28/2016 0549   NA 136 11/27/2016 2208   NA 137 10/17/2016 1815   K 3.8 11/28/2016 0549   K 4.2 11/27/2016 2208   K 3.7 10/17/2016 1815   CL 96 (L) 11/28/2016 0549   CL 99 (L) 11/27/2016 2208   CL 103 10/17/2016 1815   CO2 30 11/28/2016 0549   CO2 23 11/27/2016 2208   CO2 23 10/17/2016 1815   GLUCOSE 106 (H) 11/28/2016 0549   GLUCOSE 108 (H) 11/27/2016 2208   GLUCOSE 108 (H) 10/17/2016 1815   BUN 5 (L) 11/28/2016 0549   BUN 5 (L) 11/27/2016 2208   BUN  10 10/17/2016 1815   CREATININE 0.66 11/28/2016 0549   CREATININE 0.75 11/27/2016 2208   CREATININE 0.70 10/17/2016 1815   CALCIUM 9.4 11/28/2016 0549   CALCIUM 9.0 11/27/2016 2208   CALCIUM 10.2 10/17/2016 1815   GFRNONAA >60 11/28/2016 0549   GFRNONAA >60 11/27/2016 2208   GFRNONAA >60 10/17/2016 1815   GFRAA >60 11/28/2016 0549   GFRAA >60 11/27/2016 2208   GFRAA >60 10/17/2016 1815   CBC    Component Value Date/Time   WBC 6.8 11/28/2016 0549   RBC 5.49 (H) 11/28/2016 0549   HGB 12.2 11/28/2016 0549   HCT 39.0 11/28/2016 0549   PLT 249 11/28/2016 0549   MCV 71.0 (L) 11/28/2016 0549   MCH 22.2 (L) 11/28/2016 0549   MCHC 31.3 11/28/2016 0549   RDW 18.7 (H) 11/28/2016 0549   LYMPHSABS 1.9 11/27/2016 2208   MONOABS 0.6 11/27/2016 2208   EOSABS 0.2 11/27/2016 2208   BASOSABS 0.1 11/27/2016 2208   HEPATIC Function Panel  Recent Labs  10/17/16 1815 11/27/16 2208  PROT 7.6 7.6   HEMOGLOBIN A1C No components found for: HGA1C,  MPG CARDIAC ENZYMES No results found for: CKTOTAL, CKMB, CKMBINDEX, TROPONINI BNP No results for input(s): PROBNP in the last 8760 hours. TSH  Recent Labs  11/27/16 2208  TSH 1.263   CHOLESTEROL No results for input(s): CHOL in the last 8760  hours.  Scheduled Meds: . diltiazem  30 mg Oral Q6H  . docusate sodium  100 mg Oral BID  . heparin  5,000 Units Subcutaneous Q8H  . metoprolol tartrate  25 mg Oral BID   Continuous Infusions: PRN Meds:.albuterol, HYDROcodone-acetaminophen, ondansetron **OR** ondansetron (ZOFRAN) IV  Assessment/Plan: Paroxysmal atrial fibrillation, CHA2DS2VASc score of 1 Back pain Hip pain Obesity  Nuclear stress test in AM.   LOS: 0 days    [name]  MD  11/28/2016, 6:30 PM

## 2016-11-28 NOTE — Progress Notes (Signed)
  Echocardiogram 2D Echocardiogram has been performed.  Marissa Weber 11/28/2016, 5:03 PM

## 2016-11-29 ENCOUNTER — Observation Stay (HOSPITAL_COMMUNITY): Payer: Medicaid Other

## 2016-11-29 ENCOUNTER — Inpatient Hospital Stay (HOSPITAL_COMMUNITY)
Admission: AD | Disposition: A | Discharge status: Home / Self Care | Payer: Self-pay | Source: Ambulatory Visit | Attending: Cardiovascular Disease

## 2016-11-29 ENCOUNTER — Encounter (HOSPITAL_COMMUNITY)
Admission: RE | Admit: 2016-11-29 | Discharge: 2016-11-29 | Disposition: A | Discharge status: Home / Self Care | Payer: Medicaid Other | Source: Ambulatory Visit | Attending: Orthopedic Surgery | Admitting: Orthopedic Surgery

## 2016-11-29 DIAGNOSIS — M25552 Pain in left hip: Secondary | ICD-10-CM | POA: Diagnosis present

## 2016-11-29 DIAGNOSIS — M549 Dorsalgia, unspecified: Secondary | ICD-10-CM | POA: Diagnosis present

## 2016-11-29 DIAGNOSIS — I480 Paroxysmal atrial fibrillation: Secondary | ICD-10-CM | POA: Diagnosis present

## 2016-11-29 DIAGNOSIS — Z888 Allergy status to other drugs, medicaments and biological substances status: Secondary | ICD-10-CM | POA: Diagnosis not present

## 2016-11-29 DIAGNOSIS — M1612 Unilateral primary osteoarthritis, left hip: Secondary | ICD-10-CM | POA: Diagnosis present

## 2016-11-29 DIAGNOSIS — F17210 Nicotine dependence, cigarettes, uncomplicated: Secondary | ICD-10-CM | POA: Diagnosis present

## 2016-11-29 DIAGNOSIS — Z79899 Other long term (current) drug therapy: Secondary | ICD-10-CM | POA: Diagnosis not present

## 2016-11-29 DIAGNOSIS — F419 Anxiety disorder, unspecified: Secondary | ICD-10-CM | POA: Diagnosis present

## 2016-11-29 DIAGNOSIS — Z9049 Acquired absence of other specified parts of digestive tract: Secondary | ICD-10-CM | POA: Diagnosis not present

## 2016-11-29 DIAGNOSIS — Z981 Arthrodesis status: Secondary | ICD-10-CM | POA: Diagnosis not present

## 2016-11-29 DIAGNOSIS — Z6839 Body mass index (BMI) 39.0-39.9, adult: Secondary | ICD-10-CM | POA: Diagnosis not present

## 2016-11-29 DIAGNOSIS — R0789 Other chest pain: Secondary | ICD-10-CM | POA: Diagnosis present

## 2016-11-29 DIAGNOSIS — Z9889 Other specified postprocedural states: Secondary | ICD-10-CM | POA: Diagnosis not present

## 2016-11-29 DIAGNOSIS — I10 Essential (primary) hypertension: Secondary | ICD-10-CM | POA: Diagnosis present

## 2016-11-29 DIAGNOSIS — Z96641 Presence of right artificial hip joint: Secondary | ICD-10-CM | POA: Diagnosis present

## 2016-11-29 DIAGNOSIS — E669 Obesity, unspecified: Secondary | ICD-10-CM | POA: Diagnosis present

## 2016-11-29 HISTORY — PX: LEFT HEART CATH AND CORONARY ANGIOGRAPHY: CATH118249

## 2016-11-29 SURGERY — LEFT HEART CATH AND CORONARY ANGIOGRAPHY
Anesthesia: LOCAL

## 2016-11-29 MED ORDER — HYDROMORPHONE HCL 1 MG/ML IJ SOLN
INTRAMUSCULAR | Status: DC | PRN
  Administered 2016-11-29 (×2): 0.5 mg via INTRAVENOUS

## 2016-11-29 MED ORDER — SODIUM CHLORIDE 0.9% FLUSH: 3.0000 mL | Freq: Two times a day (BID) | INTRAVENOUS | Status: DC

## 2016-11-29 MED ORDER — LIDOCAINE HCL (PF) 1 % IJ SOLN
INTRAMUSCULAR | Status: DC | PRN
  Administered 2016-11-29: 30 mL

## 2016-11-29 MED ORDER — HYDROMORPHONE HCL 1 MG/ML IJ SOLN
INTRAMUSCULAR | Status: AC
  Filled 2016-11-29: qty 1

## 2016-11-29 MED ORDER — TECHNETIUM TC 99M TETROFOSMIN IV KIT
10.0000 | PACK | Freq: Once | INTRAVENOUS | Status: AC | PRN
  Administered 2016-11-29: 10 via INTRAVENOUS

## 2016-11-29 MED ORDER — SODIUM CHLORIDE 0.9 % IV SOLN: INTRAVENOUS | Status: AC

## 2016-11-29 MED ORDER — ACETAMINOPHEN 325 MG PO TABS: 650.0000 mg | ORAL_TABLET | ORAL | Status: DC | PRN

## 2016-11-29 MED ORDER — TECHNETIUM TC 99M TETROFOSMIN IV KIT
30.0000 | PACK | Freq: Once | INTRAVENOUS | Status: AC | PRN
  Administered 2016-11-29: 30 via INTRAVENOUS

## 2016-11-29 MED ORDER — MIDAZOLAM HCL 2 MG/2ML IJ SOLN
INTRAMUSCULAR | Status: AC
  Filled 2016-11-29: qty 2

## 2016-11-29 MED ORDER — HEPARIN (PORCINE) IN NACL 2-0.9 UNIT/ML-% IJ SOLN
INTRAMUSCULAR | Status: DC | PRN
  Administered 2016-11-29: 1000 mL

## 2016-11-29 MED ORDER — SODIUM CHLORIDE 0.9 % IV SOLN: 250.0000 mL | INTRAVENOUS | Status: DC | PRN

## 2016-11-29 MED ORDER — MIDAZOLAM HCL 2 MG/2ML IJ SOLN
INTRAMUSCULAR | Status: DC | PRN
  Administered 2016-11-29 (×2): 1 mg via INTRAVENOUS

## 2016-11-29 MED ORDER — REGADENOSON 0.4 MG/5ML IV SOLN
0.4000 mg | Freq: Once | INTRAVENOUS | Status: AC
  Administered 2016-11-29: 0.4 mg via INTRAVENOUS
  Filled 2016-11-29: qty 5

## 2016-11-29 MED ORDER — SODIUM CHLORIDE 0.9 % IV SOLN
INTRAVENOUS | Status: DC | PRN
  Administered 2016-11-29: 100 mL/h via INTRAVENOUS

## 2016-11-29 MED ORDER — REGADENOSON 0.4 MG/5ML IV SOLN
INTRAVENOUS | Status: AC
  Administered 2016-11-29: 0.4 mg via INTRAVENOUS
  Filled 2016-11-29: qty 5

## 2016-11-29 MED ORDER — SODIUM CHLORIDE 0.9 % IV SOLN: INTRAVENOUS | Status: DC

## 2016-11-29 MED ORDER — HEPARIN (PORCINE) IN NACL 2-0.9 UNIT/ML-% IJ SOLN
INTRAMUSCULAR | Status: AC
  Filled 2016-11-29: qty 1000

## 2016-11-29 MED ORDER — FENTANYL CITRATE (PF) 100 MCG/2ML IJ SOLN
INTRAMUSCULAR | Status: DC | PRN
  Administered 2016-11-29: 50 ug via INTRAVENOUS
  Administered 2016-11-29 (×2): 25 ug via INTRAVENOUS

## 2016-11-29 MED ORDER — ASPIRIN 81 MG PO CHEW
81.0000 mg | CHEWABLE_TABLET | ORAL | Status: AC
  Administered 2016-11-29: 81 mg via ORAL
  Filled 2016-11-29: qty 1

## 2016-11-29 MED ORDER — HYDROMORPHONE HCL 1 MG/ML IJ SOLN
0.5000 mg | INTRAMUSCULAR | Status: AC | PRN
Start: 2016-11-29 — End: 2016-11-30
  Administered 2016-11-29 – 2016-11-30 (×2): 0.5 mg via INTRAVENOUS
  Filled 2016-11-29 (×2): qty 1

## 2016-11-29 MED ORDER — IOPAMIDOL (ISOVUE-370) INJECTION 76%
INTRAVENOUS | Status: DC | PRN
  Administered 2016-11-29: 50 mL via INTRA_ARTERIAL

## 2016-11-29 MED ORDER — LIDOCAINE HCL (PF) 1 % IJ SOLN
INTRAMUSCULAR | Status: AC
  Filled 2016-11-29: qty 30

## 2016-11-29 MED ORDER — FENTANYL CITRATE (PF) 100 MCG/2ML IJ SOLN
INTRAMUSCULAR | Status: AC
  Filled 2016-11-29: qty 2

## 2016-11-29 MED ORDER — SODIUM CHLORIDE 0.9% FLUSH
3.0000 mL | Freq: Two times a day (BID) | INTRAVENOUS | Status: DC
  Administered 2016-11-29: 3 mL via INTRAVENOUS

## 2016-11-29 MED ORDER — SODIUM CHLORIDE 0.9% FLUSH: 3.0000 mL | INTRAVENOUS | Status: DC | PRN

## 2016-11-29 MED ORDER — IOPAMIDOL (ISOVUE-370) INJECTION 76%
INTRAVENOUS | Status: AC
  Filled 2016-11-29: qty 100

## 2016-11-29 SURGICAL SUPPLY — 12 items
CATH INFINITI 5FR MULTPACK ANG (CATHETERS) ×1 IMPLANT
COVER PRB 48X5XTLSCP FOLD TPE (BAG) IMPLANT
COVER PROBE 5X48 (BAG) ×4
KIT HEART LEFT (KITS) ×2 IMPLANT
NDL SMART 18GX3.5CM LONG (NEEDLE) IMPLANT
NEEDLE SMART 18GX3.5CM LONG (NEEDLE) ×2 IMPLANT
PACK CARDIAC CATHETERIZATION (CUSTOM PROCEDURE TRAY) ×2 IMPLANT
SHEATH PINNACLE 5F 10CM (SHEATH) ×1 IMPLANT
SYR MEDRAD MARK V 150ML (SYRINGE) ×2 IMPLANT
TRANSDUCER W/STOPCOCK (MISCELLANEOUS) ×2 IMPLANT
WIRE EMERALD 3MM-J .035X150CM (WIRE) ×1 IMPLANT
WIRE HI TORQ VERSACORE-J 145CM (WIRE) ×1 IMPLANT

## 2016-11-29 NOTE — Progress Notes (Signed)
Site area: RFA Site Prior to Removal:  Level 0 Pressure Applied For: Manual: yes   Patient Status During Pull:  stable Post Pull Site:  Level 0 Post Pull Instructions Given: yes  Post Pull Pulses Present: palpable Dressing Applied:  tegaderm Bedrest begins @  1520 till 1920 Comments:by Tamala Julian

## 2016-11-29 NOTE — Progress Notes (Signed)
Called and [facility]MM for Velvet MCBride at [facility] regarding patient status as of this am that  Patient is in hospital at [facility] in room 3E22C admitted with atrial fib on 4/3.  Having chest pain with exertion.  Dr Doylene Canard cardiologist and scheduled for stress test this am at [facility] per MD progress notes.

## 2016-11-29 NOTE — Progress Notes (Signed)
Ref: Marissa Medders S, MD   Subjective:  Abnormal stress test. Possible anterior ischemia with normal EF of 63 %.  Objective:  Vital Signs in the last 24 hours: Temp:  [97.9 F (36.6 C)-98.6 F (37 C)] 97.9 F (36.6 C) (04/05 1158) Pulse Rate:  [61-85] 70 (04/05 1158) Cardiac Rhythm: Heart block (04/05 0702) Resp:  [18] 18 (04/05 1158) BP: (95-136)/(59-79) 136/79 (04/05 1158) SpO2:  [91 %-97 %] 97 % (04/05 1158) Weight:  [86.6 kg (190 lb 14.4 oz)] 86.6 kg (190 lb 14.4 oz) (04/05 0543)  Physical Exam: BP Readings from Last 1 Encounters:  11/29/16 136/79    Wt Readings from Last 1 Encounters:  11/29/16 86.6 kg (190 lb 14.4 oz)    Weight change: -0.771 kg (-1 lb 11.2 oz) Body mass index is 38.56 kg/m. HEENT: [place]/AT, Eyes-Brown, PERL, EOMI, Conjunctiva-Pink, Sclera-Non-icteric Neck: No JVD, No bruit, Trachea midline. Lungs:  Clear, Bilateral. Cardiac:  Regular rhythm, normal S1 and S2, no S3. II/VI systolic murmur. Abdomen:  Soft, non-tender. BS present. Extremities:  No edema present. No cyanosis. No clubbing. CNS: AxOx3, Cranial nerves grossly intact, moves all 4 extremities. Slow gait. Skin: Warm and dry.   Intake/Output from previous day: 04/04 0701 - 04/05 0700 In: 720 [P.O.:720] Out: 3300 [Urine:3300]    Lab Results: BMET    Component Value Date/Time   NA 135 11/28/2016 0549   NA 136 11/27/2016 2208   NA 137 10/17/2016 1815   K 3.8 11/28/2016 0549   K 4.2 11/27/2016 2208   K 3.7 10/17/2016 1815   CL 96 (L) 11/28/2016 0549   CL 99 (L) 11/27/2016 2208   CL 103 10/17/2016 1815   CO2 30 11/28/2016 0549   CO2 23 11/27/2016 2208   CO2 23 10/17/2016 1815   GLUCOSE 106 (H) 11/28/2016 0549   GLUCOSE 108 (H) 11/27/2016 2208   GLUCOSE 108 (H) 10/17/2016 1815   BUN 5 (L) 11/28/2016 0549   BUN 5 (L) 11/27/2016 2208   BUN 10 10/17/2016 1815   CREATININE 0.66 11/28/2016 0549   CREATININE 0.75 11/27/2016 2208   CREATININE 0.70 10/17/2016 1815   CALCIUM 9.4  11/28/2016 0549   CALCIUM 9.0 11/27/2016 2208   CALCIUM 10.2 10/17/2016 1815   GFRNONAA >60 11/28/2016 0549   GFRNONAA >60 11/27/2016 2208   GFRNONAA >60 10/17/2016 1815   GFRAA >60 11/28/2016 0549   GFRAA >60 11/27/2016 2208   GFRAA >60 10/17/2016 1815   CBC    Component Value Date/Time   WBC 6.8 11/28/2016 0549   RBC 5.49 (H) 11/28/2016 0549   HGB 12.2 11/28/2016 0549   HCT 39.0 11/28/2016 0549   PLT 249 11/28/2016 0549   MCV 71.0 (L) 11/28/2016 0549   MCH 22.2 (L) 11/28/2016 0549   MCHC 31.3 11/28/2016 0549   RDW 18.7 (H) 11/28/2016 0549   LYMPHSABS 1.9 11/27/2016 2208   MONOABS 0.6 11/27/2016 2208   EOSABS 0.2 11/27/2016 2208   BASOSABS 0.1 11/27/2016 2208   HEPATIC Function Panel  Recent Labs  10/17/16 1815 11/27/16 2208  PROT 7.6 7.6   HEMOGLOBIN A1C No components found for: HGA1C,  MPG CARDIAC ENZYMES No results found for: CKTOTAL, CKMB, CKMBINDEX, TROPONINI BNP No results for input(We[place]): PROBNP in the last 8760 hours. TSH  Recent Labs  11/27/16 2208  TSH 1.263   CHOLESTEROL No results for input(Weber): CHOL in the last 8760 hours.  Scheduled Meds: . diltiazem  30 mg Oral Q6H  . docusate sodium  100 mg Oral  BID  . heparin  5,000 Units Subcutaneous Q8H  . metoprolol tartrate  25 mg Oral BID   Continuous Infusions: PRN Meds:.albuterol, HYDROcodone-acetaminophen, ondansetron **OR** ondansetron (ZOFRAN) IV  Assessment/Plan: Atypical chest pain Paroxysmal atrial fibrillation Back pain Hip pain Obesity  Cardiac cath with angioplasty today. Patient understood risks and benefits and wants to proceed with cardiac catheterization.   LOS: 0 days    [name]  MD  11/29/2016, 12:45 PM

## 2016-11-30 ENCOUNTER — Encounter (HOSPITAL_COMMUNITY): Payer: Self-pay | Admitting: Cardiovascular Disease

## 2016-11-30 LAB — CBC
HEMATOCRIT: 38 % (ref 36.0–46.0)
HEMOGLOBIN: 11.9 g/dL — AB (ref 12.0–15.0)
MCH: 22.5 pg — AB (ref 26.0–34.0)
MCHC: 31.3 g/dL (ref 30.0–36.0)
MCV: 72 fL — ABNORMAL LOW (ref 78.0–100.0)
Platelets: 251 10*3/uL (ref 150–400)
RBC: 5.28 MIL/uL — ABNORMAL HIGH (ref 3.87–5.11)
RDW: 18.8 % — AB (ref 11.5–15.5)
WBC: 7.4 10*3/uL (ref 4.0–10.5)

## 2016-11-30 LAB — BASIC METABOLIC PANEL
Anion gap: 12 (ref 5–15)
CALCIUM: 9.5 mg/dL (ref 8.9–10.3)
CHLORIDE: 95 mmol/L — AB (ref 101–111)
CO2: 27 mmol/L (ref 22–32)
CREATININE: 0.6 mg/dL (ref 0.44–1.00)
GFR calc Af Amer: >60 mL/min (ref >60)
GFR calc non Af Amer: >60 mL/min (ref >60)
Glucose, Bld: 122 mg/dL — ABNORMAL HIGH (ref 65–99)
Potassium: 3.8 mmol/L (ref 3.5–5.1)
Sodium: 134 mmol/L — ABNORMAL LOW (ref 135–145)

## 2016-11-30 MED ORDER — DILTIAZEM HCL 60 MG PO TABS: 30.0000 mg | ORAL_TABLET | Freq: Two times a day (BID) | ORAL | 3 refills | Status: DC

## 2016-11-30 MED ORDER — METOPROLOL TARTRATE 25 MG PO TABS: 25.0000 mg | ORAL_TABLET | Freq: Two times a day (BID) | ORAL | 3 refills | Status: DC

## 2016-11-30 MED ORDER — FUROSEMIDE 10 MG/ML IJ SOLN
40.0000 mg | Freq: Once | INTRAMUSCULAR | Status: AC
  Administered 2016-11-30: 40 mg via INTRAVENOUS
  Filled 2016-11-30: qty 4

## 2016-11-30 MED FILL — Lidocaine HCl Local Preservative Free (PF) Inj 1%: INTRAMUSCULAR | Qty: 30 | Status: AC

## 2016-11-30 NOTE — Care Management Important Message (Signed)
Important Message  Patient Details  Name: Marissa Weber MRN: [id number] Date of Birth: [birth year]   Medicare Important Message Given:       Marissa Weber 11/30/2016, 2:52 PM

## 2016-11-30 NOTE — Progress Notes (Signed)
Pt disconnected from telemetry, IVs removed, no complications.  Pt is c/a/ox3, her vitals are WNL.  Call placed to [facility] to notify of telemetry monitoring d/c.

## 2016-11-30 NOTE — Discharge Summary (Signed)
Physician Discharge Summary  Patient ID: Marissa Weber MRN: [id number] DOB/AGE: 56-01-1961 56 y.o.  Admit date: 11/27/2016 Discharge date: 11/30/2016  Admission Diagnoses: Atrial fibrillation with RVR Back pain Left hip pain Obesity  Discharge Diagnoses:  Principal Problem:   Paroxysmal, Atrial fibrillation with RVR ([place]), CHA2DS2VASc score of 1 Active Problems:   Osteoarthritis of hip   Anxiety   Left hip pain   Obesity  Discharged Condition: fair  Hospital Course: 56 year old female came to office for left hip  surgical clearance. She was found to be in atrial fibrillation with RVR. She converted to sinus rhythm on her own. Small dose of B-blocker and diltiazem were given. She underwent nuclear stress tet showing possible anterior wall ischemia but her cardiac cath showed normal coronaries. She had elevated LV end diastolic pressure and one dose of IV lasix was given, Patient will undergo left hip surgery in 1 week and she me in 1 month or when she walk better.  Consults: cardiology  Significant Diagnostic Studies: labs: Normal electrolytes and near normal CBC. Normal TSH.    EKG-NSR.  Nuclear stress test: Possible anterior ischemia.  angiography: Cardiac catheterization : Near normal coronaries and good LV systolic function.  Treatments: cardiac meds: metoprolol and diltiazem.  Discharge Exam: Blood pressure 114/66, pulse 67, temperature 98.1 F (36.7 C), temperature source Oral, resp. rate 18, height 4\' 11"  (1.499 m), weight 87.6 kg (193 lb 3.2 oz), SpO2 95 %. General appearance: alert, cooperative and appears stated age. Head: Normocephalic, atraumatic. Eyes: Brown eyes, pink conjunctiva, corneas clear. PERRL, EOM's intact.  Neck: No adenopathy, no carotid bruit, no JVD, supple, symmetrical, trachea midline and thyroid not enlarged. Resp: Clear to auscultation bilaterally. Cardio: Regular rate and rhythm, S1, S2 normal, II/VI systolic murmur, no click, rub or  gallop. GI: Soft, non-tender; bowel sounds normal; no organomegaly. Extremities: No edema, cyanosis or clubbing. No significant right groin hematoma. Skin: Warm and dry.  Neurologic: Alert and oriented X 3, normal strength and tone. Normal coordination and slow gait.  Disposition: 01-Home or Self Care   Allergies as of 11/30/2016      Reactions   Bee Venom Swelling, Other (See Comments)   Swelling wherever the sting is. Also gets dizzy   Lisinopril Other (See Comments)   swelling      Medication List    STOP taking these medications   amLODipine 5 MG tablet Commonly known as:  NORVASC     TAKE these medications   albuterol 108 (90 Base) MCG/ACT inhaler Commonly known as:  PROVENTIL HFA;VENTOLIN HFA Inhale 2 puffs into the lungs every 4 (four) hours as needed for wheezing or shortness of breath.   diltiazem 60 MG tablet Commonly known as:  CARDIZEM Take 0.5 tablets (30 mg total) by mouth 2 (two) times daily.   EXCEDRIN EXTRA STRENGTH PO Take 1 capsule by mouth daily as needed (pain).   Melatonin 3 MG Caps Take 3 mg by mouth at bedtime as needed (for sleep).   metoprolol tartrate 25 MG tablet Commonly known as:  LOPRESSOR Take 1 tablet (25 mg total) by mouth 2 (two) times daily.   traMADol 50 MG tablet Commonly known as:  ULTRAM Take 1 tablet (50 mg total) by mouth every 6 (six) hours as needed. What changed:  reasons to take this      Follow-up [place], MD. Schedule an appointment as soon as possible for a visit in 1 month(s).   Specialty:  Cardiology Contact information: [place] [address] [phone]        Gearlean Alf, MD. Schedule an appointment as soon as possible for a visit.   Specialty:  Orthopedic Surgery Why:  As arranged, tell them blood work done in hospital. Contact information: [address] [place] [address] [phone]           Signed: Birdie Riddle 11/30/2016, 1:03 PM

## 2016-12-03 NOTE — Patient Instructions (Addendum)
Marissa Weber  12/03/2016   Your procedure is scheduled on: 12/05/16  Report to [facility] Main  Entrance              Report to admitting at     1000 AM   Call this number if you have problems the morning of surgery  [phone]   Remember: ONLY 1 PERSON MAY GO WITH YOU TO SHORT STAY TO GET  READY MORNING OF YOUR SURGERY.   Do not eat food or drink liquids :After Midnight.     Take these medicines the morning of surgery with A SIP OF WATER:  diltizem, metoprolol,albuterol and bring                                You may not have any metal on your body including hair pins and              piercings  Do not wear jewelry, make-up, lotions, powders or perfumes, deodorant             Do not wear nail polish.  Do not shave  48 hours prior to surgery.               Do not bring valuables to the hospital. [place].  Contacts, dentures or bridgework may not be worn into surgery.  Leave suitcase in the car. After surgery it may be brought to your room.                  Please read over the following fact sheets you were given: _____________________________________________________________________ [facility] - Preparing for Surgery Before surgery, you can play an important role.  Because skin is not sterile, your skin needs to be as free of germs as possible.  You can reduce the number of germs on your skin by washing with CHG (chlorahexidine gluconate) soap before surgery.  CHG is an antiseptic cleaner which kills germs and bonds with the skin to continue killing germs even after washing. Please DO NOT use if you have an allergy to CHG or antibacterial soaps.  If your skin becomes reddened/irritated stop using the CHG and inform your nurse when you arrive at Short Stay. Do not shave (including legs and underarms) for at least 48 hours prior to the first CHG shower.  You may shave your face/neck. Please follow these  instructions carefully:  1.  Shower with CHG Soap the night before surgery and the  morning of Surgery.  2.  If you choose to wash your hair, wash your hair first as usual with your  normal  shampoo.  3.  After you shampoo, rinse your hair and body thoroughly to remove the  shampoo.                           4.  Use CHG as you would any other liquid soap.  You can apply chg directly  to the skin and wash                       Gently with a scrungie or clean washcloth.  5.  Apply the CHG Soap to your body ONLY  FROM THE NECK DOWN.   Do not use on face/ open                           Wound or open sores. Avoid contact with eyes, ears mouth and genitals (private parts).                       Wash face,  Genitals (private parts) with your normal soap.             6.  Wash thoroughly, paying special attention to the area where your surgery  will be performed.  7.  Thoroughly rinse your body with warm water from the neck down.  8.  DO NOT shower/wash with your normal soap after using and rinsing off  the CHG Soap.                9.  Pat yourself dry with a clean towel.            10.  Wear clean pajamas.            11.  Place clean sheets on your bed the night of your first shower and do not  sleep with pets. Day of Surgery : Do not apply any lotions/deodorants the morning of surgery.  Please wear clean clothes to the hospital/surgery center.  FAILURE TO FOLLOW THESE INSTRUCTIONS MAY RESULT IN THE CANCELLATION OF YOUR SURGERY PATIENT SIGNATURE_________________________________  NURSE SIGNATURE__________________________________  ________________________________________________________________________   WHAT IS A BLOOD TRANSFUSION? Blood Transfusion Information  A transfusion is the replacement of blood or some of its parts. Blood is made up of multiple cells which provide different functions.  Red blood cells carry oxygen and are used for blood loss replacement.  White blood cells fight against  infection.  Platelets control bleeding.  Plasma helps clot blood.  Other blood products are available for specialized needs, such as hemophilia or other clotting disorders. BEFORE THE TRANSFUSION  Who gives blood for transfusions?   Healthy volunteers who are fully evaluated to make sure their blood is safe. This is blood bank blood. Transfusion therapy is the safest it has ever been in the practice of medicine. Before blood is taken from a donor, a complete history is taken to make sure that person has no history of diseases nor engages in risky social behavior (examples are intravenous drug use or sexual activity with multiple partners). The donor's travel history is screened to minimize risk of transmitting infections, such as malaria. The donated blood is tested for signs of infectious diseases, such as HIV and hepatitis. The blood is then tested to be sure it is compatible with you in order to minimize the chance of a transfusion reaction. If you or a relative donates blood, this is often done in anticipation of surgery and is not appropriate for emergency situations. It takes many days to process the donated blood. RISKS AND COMPLICATIONS Although transfusion therapy is very safe and saves many lives, the main dangers of transfusion include:   Getting an infectious disease.  Developing a transfusion reaction. This is an allergic reaction to something in the blood you were given. Every precaution is taken to prevent this. The decision to have a blood transfusion has been considered carefully by your caregiver before blood is given. Blood is not given unless the benefits outweigh the risks. AFTER THE TRANSFUSION  Right after receiving a blood transfusion, you will usually feel much  better and more energetic. This is especially true if your red blood cells have gotten low (anemic). The transfusion raises the level of the red blood cells which carry oxygen, and this usually causes an energy  increase.  The nurse administering the transfusion will monitor you carefully for complications. HOME CARE INSTRUCTIONS  No special instructions are needed after a transfusion. You may find your energy is better. Speak with your caregiver about any limitations on activity for underlying diseases you may have. SEEK MEDICAL CARE IF:   Your condition is not improving after your transfusion.  You develop redness or irritation at the intravenous (IV) site. SEEK IMMEDIATE MEDICAL CARE IF:  Any of the following symptoms occur over the next 12 hours:  Shaking chills.  You have a temperature by mouth above 102 F (38.9 C), not controlled by medicine.  Chest, back, or muscle pain.  People around you feel you are not acting correctly or are confused.  Shortness of breath or difficulty breathing.  Dizziness and fainting.  You get a rash or develop hives.  You have a decrease in urine output.  Your urine turns a dark color or changes to pink, red, or brown. Any of the following symptoms occur over the next 10 days:  You have a temperature by mouth above 102 F (38.9 C), not controlled by medicine.  Shortness of breath.  Weakness after normal activity.  The white part of the eye turns yellow (jaundice).  You have a decrease in the amount of urine or are urinating less often.  Your urine turns a dark color or changes to pink, red, or brown. Document Released: 08/10/2000 Document Revised: 11/05/2011 Document Reviewed: 03/29/2008 Exit[employer] Patient Information 2014 [place].  _______________________________________________________________________  Incentive Spirometer  An incentive spirometer is a tool that can help keep your lungs clear and active. This tool measures how well you are filling your lungs with each breath. Taking long deep breaths may help reverse or decrease the chance of developing breathing (pulmonary) problems (especially infection) following:  A long  period of time when you are unable to move or be active. BEFORE THE PROCEDURE   If the spirometer includes an indicator to show your best effort, your nurse or respiratory therapist will set it to a desired goal.  If possible, sit up straight or lean slightly forward. Try not to slouch.  Hold the incentive spirometer in an upright position. INSTRUCTIONS FOR USE  1. Sit on the edge of your bed if possible, or sit up as far as you can in bed or on a chair. 2. Hold the incentive spirometer in an upright position. 3. Breathe out normally. 4. Place the mouthpiece in your mouth and seal your lips tightly around it. 5. Breathe in slowly and as deeply as possible, raising the piston or the ball toward the top of the column. 6. Hold your breath for 3-5 seconds or for as long as possible. Allow the piston or ball to fall to the bottom of the column. 7. Remove the mouthpiece from your mouth and breathe out normally. 8. Rest for a few seconds and repeat Steps 1 through 7 at least 10 times every 1-2 hours when you are awake. Take your time and take a few normal breaths between deep breaths. 9. The spirometer may include an indicator to show your best effort. Use the indicator as a goal to work toward during each repetition. 10. After each set of 10 deep breaths, practice coughing to be sure  your lungs are clear. If you have an incision (the cut made at the time of surgery), support your incision when coughing by placing a pillow or rolled up towels firmly against it. Once you are able to get out of bed, walk around indoors and cough well. You may stop using the incentive spirometer when instructed by your caregiver.  RISKS AND COMPLICATIONS  Take your time so you do not get dizzy or light-headed.  If you are in pain, you may need to take or ask for pain medication before doing incentive spirometry. It is harder to take a deep breath if you are having pain. AFTER USE  Rest and breathe slowly and  easily.  It can be helpful to keep track of a log of your progress. Your caregiver can provide you with a simple table to help with this. If you are using the spirometer at home, follow these instructions: [place] IF:   You are having difficultly using the spirometer.  You have trouble using the spirometer as often as instructed.  Your pain medication is not giving enough relief while using the spirometer.  You develop fever of 100.5 F (38.1 C) or higher. SEEK IMMEDIATE MEDICAL CARE IF:   You cough up bloody sputum that had not been present before.  You develop fever of 102 F (38.9 C) or greater.  You develop worsening pain at or near the incision site. MAKE SURE YOU:   Understand these instructions.  Will watch your condition.  Will get help right away if you are not doing well or get worse. Document Released: 12/24/2006 Document Revised: 11/05/2011 Document Reviewed: 02/24/2007 [facility] Patient Information 2014 [place], [place].   ________________________________________________________________________

## 2016-12-03 NOTE — Progress Notes (Addendum)
11/27/16 ekg epic SR 11/27/16 echo 4/3cxr Last note Dr Terie Purser 11/27/16 cbc/bmp 11/27/16 Cath 11/29/16  All in epic

## 2016-12-04 ENCOUNTER — Encounter (INDEPENDENT_AMBULATORY_CARE_PROVIDER_SITE_OTHER): Payer: Self-pay

## 2016-12-04 ENCOUNTER — Encounter (HOSPITAL_COMMUNITY)
Admission: RE | Admit: 2016-12-04 | Discharge: 2016-12-04 | Disposition: A | Discharge status: Home / Self Care | Payer: Medicaid Other | Source: Ambulatory Visit | Attending: Orthopedic Surgery | Admitting: Orthopedic Surgery

## 2016-12-04 ENCOUNTER — Ambulatory Visit: Payer: Self-pay | Admitting: Orthopedic Surgery

## 2016-12-04 ENCOUNTER — Encounter (HOSPITAL_COMMUNITY): Payer: Self-pay

## 2016-12-04 HISTORY — DX: Cardiac arrhythmia, unspecified: I49.9

## 2016-12-04 LAB — COMPREHENSIVE METABOLIC PANEL
ALBUMIN: 4.1 g/dL (ref 3.5–5.0)
ALK PHOS: 155 U/L — AB (ref 38–126)
ALT: 63 U/L — AB (ref 14–54)
AST: 59 U/L — AB (ref 15–41)
Anion gap: 12 (ref 5–15)
BILIRUBIN TOTAL: 0.5 mg/dL (ref 0.3–1.2)
BUN: 9 mg/dL (ref 6–20)
CALCIUM: 9.1 mg/dL (ref 8.9–10.3)
CO2: 25 mmol/L (ref 22–32)
Chloride: 101 mmol/L (ref 101–111)
Creatinine, Ser: 0.7 mg/dL (ref 0.44–1.00)
GFR calc Af Amer: >60 mL/min (ref >60)
GFR calc non Af Amer: >60 mL/min (ref >60)
GLUCOSE: 92 mg/dL (ref 65–99)
Potassium: 4.2 mmol/L (ref 3.5–5.1)
Sodium: 138 mmol/L (ref 135–145)
TOTAL PROTEIN: 8.3 g/dL — AB (ref 6.5–8.1)

## 2016-12-04 LAB — PROTIME-INR
INR: 1.07
Prothrombin Time: 14 seconds (ref 11.4–15.2)

## 2016-12-04 LAB — SURGICAL PCR SCREEN
MRSA, PCR: NEGATIVE
STAPHYLOCOCCUS AUREUS: NEGATIVE

## 2016-12-04 LAB — APTT: aPTT: 34 seconds (ref 24–36)

## 2016-12-04 NOTE — H&P (Deleted)
  The note originally documented on this encounter has been moved the the encounter in which it belongs.  

## 2016-12-04 NOTE — H&P (Signed)
Marissa Weber DOB: September 29, 1960 Single / Language: Marissa Weber / Race: Black or African American Female Date of Admission:  12/05/2016 CC:  Left Hip pain History of Present Illness The patient is a 56 year old female who comes in for a preoperative History and Physical. The patient is scheduled for a left total hip arthroplasty (anterior) to be performed by Dr. Dione Plover. Aluisio, MD at [facility] on 12-05-2016. The patient is a 56 year old female who presented with a left hip problem. The patient reports left hip and left lateral hip problems including pain, weakness, grinding, giving way and catching symptoms that have been present for 4 year(s). The symptoms began without any known injury. Symptoms reported include hip pain and pain with weightbearing The patient reports symptoms radiating to the: left groin and left thigh (lateral side pain). The patient describes the hip problem as sharp and aching.The patient feels as if their symptoms are does feel they are worsening. Symptoms are exacerbated by movement and weight bearing. Symptoms are relieved by rest. Current treatment includes nonsteroidal anti-inflammatory drugs. Left hip hurts her as bad as the right hip ever did. Right hip has done great since her hip replacement. Left hip is hurting at all times. Pain is limiting what she can and cannot do. She states that it is occurring at all times. She is unable to do things, she desires. She has lost a lot of motion in the hip. It is either hurting her at night. Right hip has done great with the replacement. She states that she never has any problems with that. AP pelvis and lateral of both hips showed a prosthesis on her right in excellent position with no periprosthetic abnormalities. On the left, she has severe bone-on-bone arthritis throughout the entire hip. She is almost fused in her right hip. I reviewed radiographs of the knee. She does have arthritic change in the knee. There is no evidence of  fracture. She got advanced end-stage arthritis of the left hip. At this point, the most predictable means of improving pain and function of that hip would be total hip arthroplasty. Risks and benefits were discussed and the patient elected to proceed with surgery. They have been treated conservatively in the past for the above stated problem and despite conservative measures, they continue to have progressive pain and severe functional limitations and dysfunction. They have failed non-operative management including home exercise, medications. It is felt that they would benefit from undergoing total joint replacement. Risks and benefits of the procedure have been discussed with the patient and they elect to proceed with surgery. There are no active contraindications to surgery such as ongoing infection or rapidly progressive neurological disease.   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)  Osteoarthritis, Hip (715.35)  Primary osteoarthritis of right knee (M17.11)  Primary osteoarthritis of left hip (M16.12)  Asthma  Rheumatoid Arthritis  Hypertension    Allergies No Known Drug Allergies   Family History Cancer  grandfather fathers side Diabetes Mellitus  mother Hypertension  father Rheumatoid Arthritis  mother  Social History  Drug/Alcohol Rehab (Currently)  no Drug/Alcohol Rehab (Previously)  no Pain Contract  no Alcohol use  current drinker; drinks beer; only occasionally per week Children  5 Current work status  disabled Exercise  Exercises never Illicit drug use  no Living situation  live with partner Marital status  single Number of flights of stairs before winded  1 Tobacco / smoke exposure  yes Tobacco use  current every day smoker  Medication History AmLODIPine Besylate (5MG  Tablet, Oral) Active. Melatonin (10MG  Capsule, Oral) Active. BC Powders (\\) Active.  Past Surgical History Ankle Surgery  left Spinal  Surgery  Total Hip Replacement - Right  Date: 09/2011.   Review of Systems General Not Present- Chills, Fatigue, Fever, Memory Loss, Night Sweats, Weight Gain and Weight Loss. Skin Not Present- Eczema, Hives, Itching, Lesions and Rash. HEENT Not Present- Dentures, Double Vision, Headache, Hearing Loss, Tinnitus and Visual Loss. Respiratory Not Present- Allergies, Chronic Cough, Coughing up blood, Shortness of breath at rest and Shortness of breath with exertion. Cardiovascular Not Present- Chest Pain, Difficulty Breathing Lying Down, Murmur, Palpitations, Racing/skipping heartbeats and Swelling. Gastrointestinal Not Present- Abdominal Pain, Bloody Stool, Constipation, Diarrhea, Difficulty Swallowing, Heartburn, Jaundice, Loss of appetitie, Nausea and Vomiting. Female Genitourinary Not Present- Blood in Urine, Discharge, Flank Pain, Incontinence, Painful Urination, Urgency, Urinary frequency, Urinary Retention, Urinating at Night and Weak urinary stream. Musculoskeletal Present- Joint Pain. Not Present- Back Pain, Joint Swelling, Morning Stiffness, Muscle Pain, Muscle Weakness and Spasms. Neurological Not Present- Blackout spells, Difficulty with balance, Dizziness, Paralysis, Tremor and Weakness. Psychiatric Not Present- Insomnia.  Vitals  Weight: 185 lb Height: 58.5in Weight was reported by patient. Height was reported by patient. Body Surface Area: 1.77 m Body Mass Index: 38.01 kg/m  Pulse: 88 (Regular)  BP: 110/64 (Sitting, Left Arm, Standard)  Physical Exam General Mental Status -Alert, cooperative and good historian. General Appearance-pleasant, Not in acute distress. Orientation-Oriented X3. Build & Nutrition-Well nourished and Well developed.  Head and Neck Head-normocephalic, atraumatic . Neck Global Assessment - supple, no bruit auscultated on the right, no bruit auscultated on the left.  Eye Vision-Wears corrective lenses. Pupil -  Bilateral-Regular and Round. Motion - Bilateral-EOMI.  ENMT Note: upper and lower partial denture plates   Chest and Lung Exam Auscultation Breath sounds - clear at anterior chest wall and clear at posterior chest wall. Adventitious sounds - No Adventitious sounds.  Cardiovascular Auscultation Rhythm - Regular rate and rhythm. Heart Sounds - S1 WNL and S2 WNL. Murmurs & Other Heart Sounds - Auscultation of the heart reveals - No Murmurs.  Abdomen Palpation/Percussion Tenderness - Abdomen is non-tender to palpation. Rigidity (guarding) - Abdomen is soft. Auscultation Auscultation of the abdomen reveals - Bowel sounds normal.  Female Genitourinary Note: Not done, not pertinent to present illness   Musculoskeletal Note: On exam, she is in no distress. Her right hip can be flexed to 120, rotated in 30 and out 40, abducted 40 without discomfort. Right knee shows no effusion. There is a contusion present at the proximal medial tibia. There is no lateral joint line tenderness. There is some medial tenderness. There is no instability noted about the knee. Her left hip can be flexed to about 100, no internal or external rotation, about 10 degrees of abduction.  RADIOGRAPHS AP pelvis and lateral of both hips showed a prosthesis on her right in excellent position with no periprosthetic abnormalities. On the left, she has severe bone-on-bone arthritis throughout the entire hip. She is almost fused in her right hip. I reviewed radiographs of the knee. She does have arthritic change in the knee. There is no evidence of fracture.  Assessment & Plan Primary osteoarthritis of left hip (M16.12)  Note:Surgical Plans: Left Total Hip Replacement - Anterior Approach  Disposition: Home  PCP: Dr. Doylene Canard  IV TXA  Anesthesia Issues: None  Patient was instructed on what medications to stop prior to surgery.  Signed electronically by  Alexzandrew Monika Salk, III PA-C

## 2016-12-05 ENCOUNTER — Inpatient Hospital Stay (HOSPITAL_COMMUNITY): Payer: Medicaid Other

## 2016-12-05 ENCOUNTER — Encounter (HOSPITAL_COMMUNITY): Payer: Self-pay | Admitting: *Deleted

## 2016-12-05 ENCOUNTER — Inpatient Hospital Stay (HOSPITAL_COMMUNITY): Payer: Medicaid Other | Admitting: Certified Registered Nurse Anesthetist

## 2016-12-05 ENCOUNTER — Encounter (HOSPITAL_COMMUNITY)
Admission: RE | Disposition: A | Discharge status: Home Health | Payer: Self-pay | Source: Ambulatory Visit | Attending: Orthopedic Surgery

## 2016-12-05 ENCOUNTER — Inpatient Hospital Stay (HOSPITAL_COMMUNITY)
Admission: RE | Admit: 2016-12-05 | Discharge: 2016-12-07 | DRG: 470 | Disposition: A | Discharge status: Home Health | Payer: Medicaid Other | Source: Ambulatory Visit | Attending: Orthopedic Surgery | Admitting: Orthopedic Surgery

## 2016-12-05 DIAGNOSIS — Z7982 Long term (current) use of aspirin: Secondary | ICD-10-CM

## 2016-12-05 DIAGNOSIS — I10 Essential (primary) hypertension: Secondary | ICD-10-CM | POA: Diagnosis present

## 2016-12-05 DIAGNOSIS — Z91030 Bee allergy status: Secondary | ICD-10-CM

## 2016-12-05 DIAGNOSIS — I4891 Unspecified atrial fibrillation: Secondary | ICD-10-CM | POA: Diagnosis present

## 2016-12-05 DIAGNOSIS — Z79891 Long term (current) use of opiate analgesic: Secondary | ICD-10-CM | POA: Diagnosis not present

## 2016-12-05 DIAGNOSIS — M069 Rheumatoid arthritis, unspecified: Secondary | ICD-10-CM | POA: Diagnosis present

## 2016-12-05 DIAGNOSIS — M170 Bilateral primary osteoarthritis of knee: Secondary | ICD-10-CM | POA: Diagnosis present

## 2016-12-05 DIAGNOSIS — Z96641 Presence of right artificial hip joint: Secondary | ICD-10-CM | POA: Diagnosis present

## 2016-12-05 DIAGNOSIS — F17200 Nicotine dependence, unspecified, uncomplicated: Secondary | ICD-10-CM | POA: Diagnosis present

## 2016-12-05 DIAGNOSIS — Z888 Allergy status to other drugs, medicaments and biological substances status: Secondary | ICD-10-CM

## 2016-12-05 DIAGNOSIS — E669 Obesity, unspecified: Secondary | ICD-10-CM | POA: Diagnosis present

## 2016-12-05 DIAGNOSIS — Z6839 Body mass index (BMI) 39.0-39.9, adult: Secondary | ICD-10-CM

## 2016-12-05 DIAGNOSIS — J45909 Unspecified asthma, uncomplicated: Secondary | ICD-10-CM | POA: Diagnosis present

## 2016-12-05 DIAGNOSIS — Z79899 Other long term (current) drug therapy: Secondary | ICD-10-CM | POA: Diagnosis not present

## 2016-12-05 DIAGNOSIS — Z96649 Presence of unspecified artificial hip joint: Secondary | ICD-10-CM

## 2016-12-05 DIAGNOSIS — M1612 Unilateral primary osteoarthritis, left hip: Principal | ICD-10-CM | POA: Diagnosis present

## 2016-12-05 DIAGNOSIS — M169 Osteoarthritis of hip, unspecified: Secondary | ICD-10-CM | POA: Diagnosis present

## 2016-12-05 HISTORY — PX: TOTAL HIP ARTHROPLASTY: SHX124

## 2016-12-05 LAB — TYPE AND SCREEN
ABO/RH(D): O POS
Antibody Screen: NEGATIVE

## 2016-12-05 SURGERY — ARTHROPLASTY, HIP, TOTAL, ANTERIOR APPROACH
Anesthesia: Spinal | Site: Hip | Laterality: Left

## 2016-12-05 MED ORDER — MORPHINE SULFATE (PF) 2 MG/ML IV SOLN
1.0000 mg | INTRAVENOUS | Status: DC | PRN
  Administered 2016-12-06 (×3): 1 mg via INTRAVENOUS
  Filled 2016-12-05 (×3): qty 1

## 2016-12-05 MED ORDER — BUPIVACAINE HCL (PF) 0.5 % IJ SOLN
INTRAMUSCULAR | Status: AC
  Filled 2016-12-05: qty 30

## 2016-12-05 MED ORDER — ONDANSETRON HCL 4 MG/2ML IJ SOLN: 4.0000 mg | Freq: Four times a day (QID) | INTRAMUSCULAR | Status: DC | PRN

## 2016-12-05 MED ORDER — PROMETHAZINE HCL 25 MG/ML IJ SOLN: 6.2500 mg | INTRAMUSCULAR | Status: DC | PRN

## 2016-12-05 MED ORDER — HYDROMORPHONE HCL 2 MG/ML IJ SOLN
0.2500 mg | INTRAMUSCULAR | Status: DC | PRN
  Administered 2016-12-05: 0.5 mg via INTRAVENOUS

## 2016-12-05 MED ORDER — LACTATED RINGERS IV SOLN: INTRAVENOUS | Status: DC

## 2016-12-05 MED ORDER — METOPROLOL TARTRATE 25 MG PO TABS
25.0000 mg | ORAL_TABLET | Freq: Two times a day (BID) | ORAL | Status: DC
  Administered 2016-12-05 – 2016-12-07 (×4): 25 mg via ORAL
  Filled 2016-12-05 (×4): qty 1

## 2016-12-05 MED ORDER — ACETAMINOPHEN 500 MG PO TABS
1000.0000 mg | ORAL_TABLET | Freq: Four times a day (QID) | ORAL | Status: AC
  Administered 2016-12-05 – 2016-12-06 (×3): 1000 mg via ORAL
  Filled 2016-12-05 (×4): qty 2

## 2016-12-05 MED ORDER — CEFAZOLIN SODIUM-DEXTROSE 2-4 GM/100ML-% IV SOLN
2.0000 g | Freq: Four times a day (QID) | INTRAVENOUS | Status: AC
  Administered 2016-12-05 – 2016-12-06 (×2): 2 g via INTRAVENOUS
  Filled 2016-12-05 (×2): qty 100

## 2016-12-05 MED ORDER — TRAMADOL HCL 50 MG PO TABS: 50.0000 mg | ORAL_TABLET | Freq: Four times a day (QID) | ORAL | Status: DC | PRN

## 2016-12-05 MED ORDER — TRANEXAMIC ACID 1000 MG/10ML IV SOLN
1000.0000 mg | INTRAVENOUS | Status: AC
  Administered 2016-12-05: 1000 mg via INTRAVENOUS
  Filled 2016-12-05: qty 1100

## 2016-12-05 MED ORDER — PHENOL 1.4 % MT LIQD
1.0000 | OROMUCOSAL | Status: DC | PRN
Start: 2016-12-05 — End: 2016-12-07

## 2016-12-05 MED ORDER — HYDROMORPHONE HCL 2 MG/ML IJ SOLN
INTRAMUSCULAR | Status: AC
  Filled 2016-12-05: qty 1

## 2016-12-05 MED ORDER — ONDANSETRON HCL 4 MG/2ML IJ SOLN
INTRAMUSCULAR | Status: AC
  Filled 2016-12-05: qty 2

## 2016-12-05 MED ORDER — OXYCODONE HCL 5 MG PO TABS
5.0000 mg | ORAL_TABLET | ORAL | Status: DC | PRN
  Administered 2016-12-05: 10 mg via ORAL
  Administered 2016-12-05: 5 mg via ORAL
  Administered 2016-12-06 – 2016-12-07 (×7): 10 mg via ORAL
  Administered 2016-12-07 (×2): 5 mg via ORAL
  Administered 2016-12-07: 10 mg via ORAL
  Filled 2016-12-05: qty 1
  Filled 2016-12-05 (×8): qty 2
  Filled 2016-12-05: qty 1
  Filled 2016-12-05: qty 2
  Filled 2016-12-05: qty 1
  Filled 2016-12-05: qty 2

## 2016-12-05 MED ORDER — ACETAMINOPHEN 10 MG/ML IV SOLN
INTRAVENOUS | Status: AC
  Filled 2016-12-05: qty 100

## 2016-12-05 MED ORDER — TRANEXAMIC ACID 1000 MG/10ML IV SOLN
1000.0000 mg | Freq: Once | INTRAVENOUS | Status: AC
  Administered 2016-12-05: 16:00:00 1000 mg via INTRAVENOUS
  Filled 2016-12-05: qty 1100

## 2016-12-05 MED ORDER — DEXAMETHASONE SODIUM PHOSPHATE 10 MG/ML IJ SOLN
INTRAMUSCULAR | Status: AC
  Filled 2016-12-05: qty 1

## 2016-12-05 MED ORDER — DEXAMETHASONE SODIUM PHOSPHATE 10 MG/ML IJ SOLN
10.0000 mg | Freq: Once | INTRAMUSCULAR | Status: AC
  Administered 2016-12-05: 10 mg via INTRAVENOUS

## 2016-12-05 MED ORDER — CHLORHEXIDINE GLUCONATE 4 % EX LIQD: 60.0000 mL | Freq: Once | CUTANEOUS | Status: DC

## 2016-12-05 MED ORDER — BUPIVACAINE HCL (PF) 0.25 % IJ SOLN
INTRAMUSCULAR | Status: AC
  Filled 2016-12-05: qty 30

## 2016-12-05 MED ORDER — AMLODIPINE BESYLATE 5 MG PO TABS: 5.0000 mg | ORAL_TABLET | Freq: Every day | ORAL | Status: DC

## 2016-12-05 MED ORDER — BISACODYL 10 MG RE SUPP: 10.0000 mg | Freq: Every day | RECTAL | Status: DC | PRN

## 2016-12-05 MED ORDER — BUPIVACAINE HCL (PF) 0.5 % IJ SOLN
INTRAMUSCULAR | Status: DC | PRN
  Administered 2016-12-05: 3 mL via INTRATHECAL

## 2016-12-05 MED ORDER — PROPOFOL 10 MG/ML IV BOLUS
INTRAVENOUS | Status: AC
  Filled 2016-12-05: qty 20

## 2016-12-05 MED ORDER — DILTIAZEM HCL 30 MG PO TABS
30.0000 mg | ORAL_TABLET | Freq: Two times a day (BID) | ORAL | Status: DC
  Administered 2016-12-05 – 2016-12-07 (×4): 30 mg via ORAL
  Filled 2016-12-05 (×5): qty 1

## 2016-12-05 MED ORDER — PROPOFOL 10 MG/ML IV BOLUS
INTRAVENOUS | Status: DC | PRN
  Administered 2016-12-05 (×2): 10 mg via INTRAVENOUS

## 2016-12-05 MED ORDER — METOCLOPRAMIDE HCL 5 MG PO TABS: 5.0000 mg | ORAL_TABLET | Freq: Three times a day (TID) | ORAL | Status: DC | PRN

## 2016-12-05 MED ORDER — SODIUM CHLORIDE 0.9 % IV SOLN
INTRAVENOUS | Status: DC | PRN
  Administered 2016-12-05: 25 ug/min via INTRAVENOUS

## 2016-12-05 MED ORDER — ONDANSETRON HCL 4 MG/2ML IJ SOLN
INTRAMUSCULAR | Status: DC | PRN
  Administered 2016-12-05: 4 mg via INTRAVENOUS

## 2016-12-05 MED ORDER — CEFAZOLIN SODIUM-DEXTROSE 2-4 GM/100ML-% IV SOLN
INTRAVENOUS | Status: AC
  Filled 2016-12-05: qty 100

## 2016-12-05 MED ORDER — ALBUTEROL SULFATE HFA 108 (90 BASE) MCG/ACT IN AERS: 2.0000 | INHALATION_SPRAY | RESPIRATORY_TRACT | Status: DC | PRN

## 2016-12-05 MED ORDER — DEXAMETHASONE SODIUM PHOSPHATE 10 MG/ML IJ SOLN
10.0000 mg | Freq: Once | INTRAMUSCULAR | Status: AC
  Administered 2016-12-06: 10:00:00 10 mg via INTRAVENOUS
  Filled 2016-12-05: qty 1

## 2016-12-05 MED ORDER — MEPERIDINE HCL 50 MG/ML IJ SOLN: 6.2500 mg | INTRAMUSCULAR | Status: DC | PRN

## 2016-12-05 MED ORDER — FENTANYL CITRATE (PF) 100 MCG/2ML IJ SOLN
INTRAMUSCULAR | Status: AC
  Filled 2016-12-05: qty 2

## 2016-12-05 MED ORDER — DIPHENHYDRAMINE HCL 12.5 MG/5ML PO ELIX: 12.5000 mg | ORAL_SOLUTION | ORAL | Status: DC | PRN

## 2016-12-05 MED ORDER — HYDROMORPHONE HCL 2 MG/ML IJ SOLN: 0.2500 mg | INTRAMUSCULAR | Status: DC | PRN

## 2016-12-05 MED ORDER — PROPOFOL 500 MG/50ML IV EMUL
INTRAVENOUS | Status: DC | PRN
  Administered 2016-12-05: 100 ug/kg/min via INTRAVENOUS

## 2016-12-05 MED ORDER — LACTATED RINGERS IV SOLN
INTRAVENOUS | Status: DC
  Administered 2016-12-05 (×2): via INTRAVENOUS

## 2016-12-05 MED ORDER — SODIUM CHLORIDE 0.9 % IV SOLN
INTRAVENOUS | Status: DC
  Administered 2016-12-05 – 2016-12-06 (×2): via INTRAVENOUS

## 2016-12-05 MED ORDER — MENTHOL 3 MG MT LOZG: 1.0000 | LOZENGE | OROMUCOSAL | Status: DC | PRN

## 2016-12-05 MED ORDER — PHENYLEPHRINE HCL 10 MG/ML IJ SOLN
INTRAMUSCULAR | Status: DC | PRN
  Administered 2016-12-05: 80 ug via INTRAVENOUS

## 2016-12-05 MED ORDER — ACETAMINOPHEN 10 MG/ML IV SOLN
1000.0000 mg | Freq: Once | INTRAVENOUS | Status: AC
  Administered 2016-12-05: 1000 mg via INTRAVENOUS

## 2016-12-05 MED ORDER — ONDANSETRON HCL 4 MG PO TABS: 4.0000 mg | ORAL_TABLET | Freq: Four times a day (QID) | ORAL | Status: DC | PRN

## 2016-12-05 MED ORDER — METHOCARBAMOL 500 MG PO TABS
500.0000 mg | ORAL_TABLET | Freq: Four times a day (QID) | ORAL | Status: DC | PRN
  Administered 2016-12-05 – 2016-12-07 (×6): 500 mg via ORAL
  Filled 2016-12-05 (×6): qty 1

## 2016-12-05 MED ORDER — ACETAMINOPHEN 325 MG PO TABS: 650.0000 mg | ORAL_TABLET | Freq: Four times a day (QID) | ORAL | Status: DC | PRN

## 2016-12-05 MED ORDER — ALBUTEROL SULFATE (2.5 MG/3ML) 0.083% IN NEBU: 2.5000 mg | INHALATION_SOLUTION | RESPIRATORY_TRACT | Status: DC | PRN

## 2016-12-05 MED ORDER — DEXTROSE 5 % IV SOLN
500.0000 mg | Freq: Four times a day (QID) | INTRAVENOUS | Status: DC | PRN
  Administered 2016-12-05: 500 mg via INTRAVENOUS
  Filled 2016-12-05: qty 5
  Filled 2016-12-05: qty 550

## 2016-12-05 MED ORDER — FENTANYL CITRATE (PF) 100 MCG/2ML IJ SOLN
INTRAMUSCULAR | Status: DC | PRN
  Administered 2016-12-05 (×2): 50 ug via INTRAVENOUS

## 2016-12-05 MED ORDER — PHENYLEPHRINE HCL 10 MG/ML IJ SOLN
INTRAMUSCULAR | Status: AC
  Filled 2016-12-05: qty 1

## 2016-12-05 MED ORDER — MIDAZOLAM HCL 5 MG/5ML IJ SOLN
INTRAMUSCULAR | Status: DC | PRN
  Administered 2016-12-05: 2 mg via INTRAVENOUS

## 2016-12-05 MED ORDER — RIVAROXABAN 10 MG PO TABS
10.0000 mg | ORAL_TABLET | Freq: Every day | ORAL | Status: DC
  Administered 2016-12-06 – 2016-12-07 (×2): 10 mg via ORAL
  Filled 2016-12-05 (×2): qty 1

## 2016-12-05 MED ORDER — FLEET ENEMA 7-19 GM/118ML RE ENEM: 1.0000 | ENEMA | Freq: Once | RECTAL | Status: DC | PRN

## 2016-12-05 MED ORDER — MIDAZOLAM HCL 2 MG/2ML IJ SOLN
INTRAMUSCULAR | Status: AC
  Filled 2016-12-05: qty 2

## 2016-12-05 MED ORDER — METOCLOPRAMIDE HCL 5 MG/ML IJ SOLN: 5.0000 mg | Freq: Three times a day (TID) | INTRAMUSCULAR | Status: DC | PRN

## 2016-12-05 MED ORDER — HYDROMORPHONE HCL 2 MG/ML IJ SOLN
0.2500 mg | INTRAMUSCULAR | Status: DC | PRN
  Administered 2016-12-05 (×5): 0.5 mg via INTRAVENOUS

## 2016-12-05 MED ORDER — BUPIVACAINE HCL (PF) 0.25 % IJ SOLN
INTRAMUSCULAR | Status: DC | PRN
  Administered 2016-12-05: 30 mL

## 2016-12-05 MED ORDER — CEFAZOLIN SODIUM-DEXTROSE 2-4 GM/100ML-% IV SOLN
2.0000 g | INTRAVENOUS | Status: AC
  Administered 2016-12-05: 2 g via INTRAVENOUS

## 2016-12-05 MED ORDER — EPHEDRINE SULFATE 50 MG/ML IJ SOLN
INTRAMUSCULAR | Status: DC | PRN
  Administered 2016-12-05 (×4): 10 mg via INTRAVENOUS

## 2016-12-05 MED ORDER — PROPOFOL 10 MG/ML IV BOLUS
INTRAVENOUS | Status: AC
  Filled 2016-12-05: qty 40

## 2016-12-05 MED ORDER — DOCUSATE SODIUM 100 MG PO CAPS
100.0000 mg | ORAL_CAPSULE | Freq: Two times a day (BID) | ORAL | Status: DC
  Administered 2016-12-05 – 2016-12-07 (×4): 100 mg via ORAL
  Filled 2016-12-05 (×4): qty 1

## 2016-12-05 MED ORDER — ACETAMINOPHEN 650 MG RE SUPP: 650.0000 mg | Freq: Four times a day (QID) | RECTAL | Status: DC | PRN

## 2016-12-05 MED ORDER — POLYETHYLENE GLYCOL 3350 17 G PO PACK: 17.0000 g | PACK | Freq: Every day | ORAL | Status: DC | PRN

## 2016-12-05 SURGICAL SUPPLY — 38 items
BAG DECANTER FOR FLEXI CONT (MISCELLANEOUS) ×3 IMPLANT
BAG SPEC THK2 15X12 ZIP CLS (MISCELLANEOUS)
BAG ZIPLOCK 12X15 (MISCELLANEOUS) IMPLANT
BLADE SAG 18X100X1.27 (BLADE) ×3 IMPLANT
CAPT HIP TOTAL 2 ×2 IMPLANT
CLOSURE WOUND 1/2 X4 (GAUZE/BANDAGES/DRESSINGS) ×1
CLOTH BEACON ORANGE TIMEOUT ST (SAFETY) ×3 IMPLANT
COVER PERINEAL POST (MISCELLANEOUS) ×3 IMPLANT
COVER SURGICAL LIGHT HANDLE (MISCELLANEOUS) ×3 IMPLANT
DECANTER SPIKE VIAL GLASS SM (MISCELLANEOUS) ×3 IMPLANT
DRAPE STERI IOBAN 125X83 (DRAPES) ×3 IMPLANT
DRAPE U-SHAPE 47X51 STRL (DRAPES) ×6 IMPLANT
DRSG ADAPTIC 3X8 NADH LF (GAUZE/BANDAGES/DRESSINGS) ×3 IMPLANT
DRSG MEPILEX BORDER 4X4 (GAUZE/BANDAGES/DRESSINGS) ×3 IMPLANT
DRSG MEPILEX BORDER 4X8 (GAUZE/BANDAGES/DRESSINGS) ×3 IMPLANT
DURAPREP 26ML APPLICATOR (WOUND CARE) ×3 IMPLANT
ELECT REM PT RETURN 15FT ADLT (MISCELLANEOUS) ×3 IMPLANT
EVACUATOR 1/8 PVC DRAIN (DRAIN) ×3 IMPLANT
GLOVE BIO SURGEON STRL SZ7.5 (GLOVE) ×3 IMPLANT
GLOVE BIO SURGEON STRL SZ8 (GLOVE) ×6 IMPLANT
GLOVE BIOGEL PI IND STRL 8 (GLOVE) ×2 IMPLANT
GLOVE BIOGEL PI INDICATOR 8 (GLOVE) ×4
GOWN STRL REUS W/TWL LRG LVL3 (GOWN DISPOSABLE) ×3 IMPLANT
GOWN STRL REUS W/TWL XL LVL3 (GOWN DISPOSABLE) ×3 IMPLANT
NS IRRIG 1000ML POUR BTL (IV SOLUTION) ×2 IMPLANT
PACK ANTERIOR HIP CUSTOM (KITS) ×3 IMPLANT
STRIP CLOSURE SKIN 1/2X4 (GAUZE/BANDAGES/DRESSINGS) ×2 IMPLANT
SUT ETHIBOND NAB CT1 #1 30IN (SUTURE) ×3 IMPLANT
SUT MNCRL AB 4-0 PS2 18 (SUTURE) ×3 IMPLANT
SUT STRATAFIX 0 PDS 27 VIOLET (SUTURE) ×3
SUT VIC AB 2-0 CT1 27 (SUTURE) ×6
SUT VIC AB 2-0 CT1 TAPERPNT 27 (SUTURE) ×2 IMPLANT
SUTURE STRATFX 0 PDS 27 VIOLET (SUTURE) ×1 IMPLANT
SYR 50ML LL SCALE MARK (SYRINGE) IMPLANT
TRAY FOLEY W/METER SILVER 14FR (SET/KITS/TRAYS/PACK) ×2 IMPLANT
TRAY FOLEY W/METER SILVER 16FR (SET/KITS/TRAYS/PACK) ×1 IMPLANT
WATER STERILE IRR 1000ML POUR (IV SOLUTION) ×4 IMPLANT
YANKAUER SUCT BULB TIP 10FT TU (MISCELLANEOUS) ×3 IMPLANT

## 2016-12-05 NOTE — Op Note (Signed)
OPERATIVE REPORT- TOTAL HIP ARTHROPLASTY   PREOPERATIVE DIAGNOSIS: Osteoarthritis of the Left hip.   POSTOPERATIVE DIAGNOSIS: Osteoarthritis of the Left  hip.   PROCEDURE: Left total hip arthroplasty, anterior approach.   SURGEON: Gaynelle Arabian, MD   ASSISTANT: Arlee Muslim, PA-C  ANESTHESIA:  Spinal  ESTIMATED BLOOD LOSS:-250 ml   DRAINS: Hemovac x1.   COMPLICATIONS: None   CONDITION: PACU - hemodynamically stable.   BRIEF CLINICAL NOTE: Marissa Weber is a 56 y.o. female who has advanced end-  stage arthritis of their Left  hip with progressively worsening pain and  dysfunction.The patient has failed nonoperative management and presents for  total hip arthroplasty.   PROCEDURE IN DETAIL: After successful administration of spinal  anesthetic, the traction boots for the [facility] bed were placed on both  feet and the patient was placed onto the [facility] bed, boots placed into the leg  holders. The Left hip was then isolated from the perineum with plastic  drapes and prepped and draped in the usual sterile fashion. ASIS and  greater trochanter were marked and a oblique incision was made, starting  at about 1 cm lateral and 2 cm distal to the ASIS and coursing towards  the anterior cortex of the femur. The skin was cut with a 10 blade  through subcutaneous tissue to the level of the fascia overlying the  tensor fascia lata muscle. The fascia was then incised in line with the  incision at the junction of the anterior third and posterior 2/3rd. The  muscle was teased off the fascia and then the interval between the TFL  and the rectus was developed. The Hohmann retractor was then placed at  the top of the femoral neck over the capsule. The vessels overlying the  capsule were cauterized and the fat on top of the capsule was removed.  A Hohmann retractor was then placed anterior underneath the rectus  femoris to give exposure to the entire anterior capsule. A T-shaped   capsulotomy was performed. The edges were tagged and the femoral head  was identified.       Osteophytes are removed off the superior acetabulum.  The femoral neck was then cut in situ with an oscillating saw. Traction  was then applied to the left lower extremity utilizing the [employer] [place]  traction. The femoral head was then removed. Retractors were placed  around the acetabulum and then circumferential removal of the labrum was  performed. Osteophytes were also removed. Reaming starts at 43 mm to  medialize and  Increased in 2 mm increments to 47 mm. We reamed in  approximately 40 degrees of abduction, 20 degrees anteversion. A 48 mm  pinnacle acetabular shell was then impacted in anatomic position under  fluoroscopic guidance with excellent purchase. We did not need to place  any additional dome screws. A 28 mm neutral + 4 marathon liner was then  placed into the acetabular shell.       The femoral lift was then placed along the lateral aspect of the femur  just distal to the vastus ridge. The leg was  externally rotated and capsule  was stripped off the inferior aspect of the femoral neck down to the  level of the lesser trochanter, this was done with electrocautery. The femur was lifted after this was performed. The  leg was then placed in an extended and adducted position essentially delivering the femur. We also removed the capsule superiorly and the piriformis from the piriformis fossa  to gain excellent exposure of the  proximal femur. Rongeur was used to remove some cancellous bone to get  into the lateral portion of the proximal femur for placement of the  initial starter reamer. The starter broaches was placed  the starter broach  and was shown to go down the center of the canal. Broaching  with the  Corail system was then performed starting at size 8, coursing  Up to size 11. A size 11 had excellent torsional and rotational  and axial stability. The trial high offset neck was then  placed  with a 28 + 1.5 trial head. The hip was then reduced. We confirmed that  the stem was in the canal both on AP and lateral x-rays. It also has excellent sizing. The hip was reduced with outstanding stability through full extension and full external rotation.. AP pelvis was taken and the leg lengths were measured and found to be equal. Hip was then dislocated again and the femoral head and neck removed. The  femoral broach was removed. Size 11 Corail stem with a high offset  neck was then impacted into the femur following native anteversion. Has  excellent purchase in the canal. Excellent torsional and rotational and  axial stability. It is confirmed to be in the canal on AP and lateral  fluoroscopic views. The 28 + 1.5 ceramic head was placed and the hip  reduced with outstanding stability. Again AP pelvis was taken and it  confirmed that the leg lengths were equal. The wound was then copiously  irrigated with saline solution and the capsule reattached and repaired  with Ethibond suture. 30 ml of .25% Bupivicaine was  injected into the capsule and into the edge of the tensor fascia lata as well as subcutaneous tissue. The fascia overlying the tensor fascia lata was then closed with a running #1 V-Loc. Subcu was closed with interrupted 2-0 Vicryl and subcuticular running 4-0 Monocryl. Incision was cleaned  and dried. Steri-Strips and a bulky sterile dressing applied. Hemovac  drain was hooked to suction and then the patient was awakened and transported to  recovery in stable condition.        Please note that a surgical assistant was a medical necessity for this procedure to perform it in a safe and expeditious manner. Assistant was necessary to provide appropriate retraction of vital neurovascular structures and to prevent femoral fracture and allow for anatomic placement of the prosthesis.  Gaynelle Arabian, M.D.

## 2016-12-05 NOTE — Anesthesia Procedure Notes (Signed)
Spinal  Patient location during procedure: OR Start time: 12/05/2016 12:05 PM End time: 12/05/2016 12:12 PM Staffing Anesthesiologist: Suella Broad D Performed: anesthesiologist  Preanesthetic Checklist Completed: patient identified, site marked, surgical consent, pre-op evaluation, timeout performed, IV checked, risks and benefits discussed and monitors and equipment checked Spinal Block Patient position: sitting Prep: Betadine Patient monitoring: heart rate, continuous pulse ox, blood pressure and cardiac monitor Approach: midline Location: L4-5 Injection technique: single-shot Needle Needle type: Whitacre and Introducer  Needle gauge: 24 G Needle length: 9 cm Additional Notes Negative paresthesia. Negative blood return. Positive free-flowing CSF. Expiration date of kit checked and confirmed. Patient tolerated procedure well, without complications.

## 2016-12-05 NOTE — Anesthesia Preprocedure Evaluation (Addendum)
Anesthesia Evaluation  Patient identified by MRN, date of birth, ID band Patient awake    Reviewed: Allergy & Precautions, NPO status , Patient's Chart, lab work & pertinent test results  Airway Mallampati: II  TM Distance: >3 FB Neck ROM: Full    Dental  (+) Dental Advisory Given, Edentulous Upper, Partial Lower   Pulmonary asthma , Current Smoker,    breath sounds clear to auscultation       Cardiovascular hypertension, Pt. on medications and Pt. on home beta blockers + dysrhythmias  Rhythm:Regular Rate:Normal     Neuro/Psych PSYCHIATRIC DISORDERS Anxiety negative neurological ROS     GI/Hepatic negative GI ROS, Neg liver ROS,   Endo/Other  negative endocrine ROS  Renal/GU negative Renal ROS  negative genitourinary   Musculoskeletal  (+) Arthritis , Osteoarthritis,    Abdominal   Peds negative pediatric ROS (+)  Hematology negative hematology ROS (+)   Anesthesia Other Findings h/o spinal fusion  Reproductive/Obstetrics negative OB ROS                           Lab Results  Component Value Date   WBC 7.4 11/30/2016   HGB 11.9 (L) 11/30/2016   HCT 38.0 11/30/2016   MCV 72.0 (L) 11/30/2016   PLT 251 11/30/2016   Lab Results  Component Value Date   CREATININE 0.70 12/04/2016   BUN 9 12/04/2016   NA 138 12/04/2016   K 4.2 12/04/2016   CL 101 12/04/2016   CO2 25 12/04/2016   Lab Results  Component Value Date   INR 1.07 12/04/2016   INR 1.05 10/15/2011   INR 1.01 10/01/2010   EKG: normal sinus rhythm.  Echo - Left ventricle: The cavity size was normal. There was mild   concentric hypertrophy. Systolic function was vigorous. The   estimated ejection fraction was in the range of 65% to 70%. Wall   motion was normal; there were no regional wall motion   abnormalities. Doppler parameters are consistent with abnormal   left ventricular relaxation (grade 1 diastolic  dysfunction). - Left atrium: The atrium was moderately dilated.  Anesthesia Physical Anesthesia Plan  ASA: III  Anesthesia Plan: Spinal   Post-op Pain Management:    Induction: Intravenous  Airway Management Planned: Natural Airway  Additional Equipment:   Intra-op Plan:   Post-operative Plan:   Informed Consent: I have reviewed the patients History and Physical, chart, labs and discussed the procedure including the risks, benefits and alternatives for the proposed anesthesia with the patient or authorized representative who has indicated his/her understanding and acceptance.     Plan Discussed with: CRNA  Anesthesia Plan Comments:        Anesthesia Quick Evaluation

## 2016-12-05 NOTE — Transfer of Care (Signed)
Immediate Anesthesia Transfer of Care Note  Patient: Marissa Weber  Procedure(s) Performed: Procedure(s): LEFT TOTAL HIP ARTHROPLASTY ANTERIOR APPROACH (Left)  Patient Location: PACU  Anesthesia Type:Spinal  Level of Consciousness:  sedated, patient cooperative and responds to stimulation  Airway & Oxygen Therapy:Patient Spontanous Breathing and Patient connected to face mask oxgen  Post-op Assessment:  Report given to PACU RN and Post -op Vital signs reviewed and stable  Post vital signs:  Reviewed and stable  Last Vitals:  Vitals:   12/05/16 1032  BP: (!) 130/93  Pulse: 70  Resp: 16  Temp: 37 C    Complications: No apparent anesthesia complications

## 2016-12-05 NOTE — Anesthesia Postprocedure Evaluation (Addendum)
Anesthesia Post Note  Patient: Marissa Weber  Procedure(s) Performed: Procedure(s) (LRB): LEFT TOTAL HIP ARTHROPLASTY ANTERIOR APPROACH (Left)  Patient location during evaluation: PACU Anesthesia Type: Spinal Level of consciousness: oriented and awake and alert Pain management: pain level controlled Vital Signs Assessment: post-procedure vital signs reviewed and stable Respiratory status: spontaneous breathing, respiratory function stable and patient connected to nasal cannula oxygen Cardiovascular status: blood pressure returned to baseline and stable Postop Assessment: no headache and no backache Anesthetic complications: no       Last Vitals:  Vitals:   12/05/16 1515 12/05/16 1531  BP: 130/86 132/89  Pulse: 78 78  Resp: 16 16  Temp:  36.5 C    Last Pain:  Vitals:   12/05/16 1531  TempSrc:   PainSc: [name]

## 2016-12-06 ENCOUNTER — Encounter (HOSPITAL_COMMUNITY): Payer: Self-pay | Admitting: Orthopedic Surgery

## 2016-12-06 LAB — CBC
HEMATOCRIT: 36.5 % (ref 36.0–46.0)
Hemoglobin: 11.4 g/dL — ABNORMAL LOW (ref 12.0–15.0)
MCH: 22.5 pg — AB (ref 26.0–34.0)
MCHC: 31.2 g/dL (ref 30.0–36.0)
MCV: 72.1 fL — AB (ref 78.0–100.0)
PLATELETS: 379 10*3/uL (ref 150–400)
RBC: 5.06 MIL/uL (ref 3.87–5.11)
RDW: 18.3 % — AB (ref 11.5–15.5)
WBC: 15.3 10*3/uL — ABNORMAL HIGH (ref 4.0–10.5)

## 2016-12-06 LAB — BASIC METABOLIC PANEL
ANION GAP: 11 (ref 5–15)
BUN: 8 mg/dL (ref 6–20)
CALCIUM: 9.4 mg/dL (ref 8.9–10.3)
CHLORIDE: 94 mmol/L — AB (ref 101–111)
CO2: 26 mmol/L (ref 22–32)
Creatinine, Ser: 0.7 mg/dL (ref 0.44–1.00)
GFR calc non Af Amer: >60 mL/min (ref >60)
GLUCOSE: 147 mg/dL — AB (ref 65–99)
Potassium: 4.3 mmol/L (ref 3.5–5.1)
Sodium: 131 mmol/L — ABNORMAL LOW (ref 135–145)

## 2016-12-06 MED ORDER — OXYCODONE HCL 5 MG PO TABS: 5.0000 mg | ORAL_TABLET | ORAL | 0 refills | Status: DC | PRN

## 2016-12-06 MED ORDER — METHOCARBAMOL 500 MG PO TABS: 500.0000 mg | ORAL_TABLET | Freq: Four times a day (QID) | ORAL | 0 refills | Status: DC | PRN

## 2016-12-06 MED ORDER — TRAMADOL HCL 50 MG PO TABS: 50.0000 mg | ORAL_TABLET | Freq: Four times a day (QID) | ORAL | 0 refills | Status: DC | PRN

## 2016-12-06 MED ORDER — RIVAROXABAN 10 MG PO TABS: 10.0000 mg | ORAL_TABLET | Freq: Every day | ORAL | 0 refills | Status: DC

## 2016-12-06 NOTE — Evaluation (Signed)
Physical Therapy Evaluation Patient Details Name: Marissa Weber MRN: [id number] DOB: [birth year] Today's Date: 12/06/2016   History of Present Illness  56 yo female s/p L THA-direct anterior 12/05/16. Hx of A fib, obesity, RA, asthma  Clinical Impression  On eval, pt required Min assist for mobility. She walked ~30 feet with a RW. Pain rated 8/10 with activity. Pt called out for pain meds during session. Will progress activity as tolerated. Pt reported she has a 4 wheeled walker at home. Would recommend a 2 wheeled walker for ambulation safety at this time.     Follow Up Recommendations Home health PT;Supervision/Assistance - 24 hour    Equipment Recommendations  Rolling walker with 5" wheels (youth height)    Recommendations for Other Services OT consult (possibly)     Precautions / Restrictions Precautions Precautions: Fall Restrictions Weight Bearing Restrictions: No LLE Weight Bearing: Weight bearing as tolerated      Mobility  Bed Mobility Overal bed mobility: Needs Assistance             General bed mobility comments: pt sitting EOB when therapist entered  Transfers Overall transfer level: Needs assistance Equipment used: Rolling walker (2 wheeled) Transfers: Sit to/from Stand Sit to Stand: Min assist;From elevated surface         General transfer comment: 2 attempts to get to standing. VCs safety, technique, hand/LE placement. Increased time.  Ambulation/Gait Ambulation/Gait assistance: Min guard Ambulation Distance (Feet): 30 Feet Assistive device: Rolling walker (2 wheeled) Gait Pattern/deviations: Step-to pattern;Decreased step length - left;Decreased stride length;Trunk flexed     General Gait Details: Close guard for safety. Multiple brief standing rest breaks with pt resting forearms on RW handles. Followed with recliner. Distance limited by pain.   Stairs            Wheelchair Mobility    Modified Rankin (Stroke Patients Only)        Balance                                             Pertinent Vitals/Pain Pain Assessment: 0-10 Pain Score: 8  Pain Location: L hip Pain Descriptors / Indicators: Aching;Sore Pain Intervention(s): Monitored during session;Repositioned;Ice applied;Patient requesting pain meds-RN notified    Home Living Family/patient expects to be discharged to:: Private residence Living Arrangements: Spouse/significant other   Type of Home: House Home Access: Stairs to enter Entrance Stairs-Rails: None Entrance Stairs-Number of Steps: 2 Home Layout: One level Home Equipment: [place] - 4 wheels;Cane - single point      Prior Function Level of Independence: Independent with assistive device(s)         Comments: using cane or rollator as needed     Hand Dominance        Extremity/Trunk Assessment   Upper Extremity Assessment Upper Extremity Assessment: Generalized weakness    Lower Extremity Assessment Lower Extremity Assessment: Generalized weakness    Cervical / Trunk Assessment Cervical / Trunk Assessment: Normal  Communication   Communication: No difficulties  Cognition Arousal/Alertness: Awake/alert Behavior During Therapy: WFL for tasks assessed/performed Overall Cognitive Status: Within Functional Limits for tasks assessed                                        General Comments  Exercises Total Joint Exercises Ankle Circles/Pumps: AROM;Both;10 reps;Supine Quad Sets: AROM;Both;10 reps;Supine Heel Slides: AAROM;Left;10 reps;Supine Hip ABduction/ADduction: AAROM;Left;10 reps;Supine   Assessment/Plan    PT Assessment Patient needs continued PT services  PT Problem List Decreased strength;Decreased range of motion;Decreased activity tolerance;Decreased balance;Decreased mobility;Decreased knowledge of use of DME       PT Treatment Interventions DME instruction;Gait training;Therapeutic exercise;Balance training;Functional  mobility training;Stair training;Therapeutic activities;Patient/family education    PT Goals (Current goals can be found in the Care Plan section)  Acute Rehab PT Goals Patient Stated Goal: less pain PT Goal Formulation: With patient Time For Goal Achievement: 12/20/16 Potential to Achieve Goals: Good    Frequency 7X/week   Barriers to discharge        Co-evaluation               End of Session Equipment Utilized During Treatment: Gait belt Activity Tolerance: Patient limited by fatigue;Patient limited by pain Patient left: in chair;with call bell/phone within reach;with family/visitor present   PT Visit Diagnosis: Muscle weakness (generalized) (M62.81);Difficulty in walking, not elsewhere classified (R26.2)    Time: 5797-2820 PT Time Calculation (min) (ACUTE ONLY): 25 min   Charges:   PT Evaluation $PT Eval Low Complexity: 1 Procedure PT Treatments $Gait Training: 8-22 mins   PT G Codes:          Weston Anna, MPT Pager: [phone]

## 2016-12-06 NOTE — Discharge Instructions (Addendum)
° °Dr. Frank Aluisio °Total Joint Specialist °[facility] °[address] °[place], [place] [address] °[phone] ° °ANTERIOR APPROACH TOTAL HIP REPLACEMENT POSTOPERATIVE DIRECTIONS ° ° °Hip Rehabilitation, Guidelines Following Surgery  °The results of a hip operation are greatly improved after range of motion and muscle strengthening exercises. Follow all safety measures which are given to protect your hip. If any of these exercises cause increased pain or swelling in your joint, decrease the amount until you are comfortable again. Then slowly increase the exercises. Call your caregiver if you have problems or questions.  ° °HOME CARE INSTRUCTIONS  °Remove items at home which could result in a fall. This includes throw rugs or furniture in walking pathways.  °· ICE to the affected hip every three hours for 30 minutes at a time and then as needed for pain and swelling.  Continue to use ice on the hip for pain and swelling from surgery. You may notice swelling that will progress down to the foot and ankle.  This is normal after surgery.  Elevate the leg when you are not up walking on it.   °· Continue to use the breathing machine which will help keep your temperature down.  It is common for your temperature to cycle up and down following surgery, especially at night when you are not up moving around and exerting yourself.  The breathing machine keeps your lungs expanded and your temperature down. ° ° °DIET °You may resume your previous home diet once your are discharged from the hospital. ° °DRESSING / WOUND CARE / SHOWERING °You may shower 3 days after surgery, but keep the wounds dry during showering.  You may use an occlusive plastic wrap (Press'n Seal for example), NO SOAKING/SUBMERGING IN THE BATHTUB.  If the bandage gets wet, change with a clean dry gauze.  If the incision gets wet, pat the wound dry with a clean towel. °You may start showering once you are discharged home but do not  submerge the incision under water. Just pat the incision dry and apply a dry gauze dressing on daily. °Change the surgical dressing daily and reapply a dry dressing each time. ° °ACTIVITY °Walk with your walker as instructed. °Use walker as long as suggested by your caregivers. °Avoid periods of inactivity such as sitting longer than an hour when not asleep. This helps prevent blood clots.  °You may resume a sexual relationship in one month or when given the OK by your doctor.  °You may return to work once you are cleared by your doctor.  °Do not drive a car for 6 weeks or until released by you surgeon.  °Do not drive while taking narcotics. ° °WEIGHT BEARING °Weight bearing as tolerated with assist device (walker, cane, etc) as directed, use it as long as suggested by your surgeon or therapist, typically at least 4-6 weeks. ° °POSTOPERATIVE CONSTIPATION PROTOCOL °Constipation - defined medically as fewer than three stools per week and severe constipation as less than one stool per week. ° °One of the most common issues patients have following surgery is constipation.  Even if you have a regular bowel pattern at home, your normal regimen is likely to be disrupted due to multiple reasons following surgery.  Combination of anesthesia, postoperative narcotics, change in appetite and fluid intake all can affect your bowels.  In order to avoid complications following surgery, here are some recommendations in order to help you during your recovery period. ° °Colace (docusate) - Pick up an over-the-counter   form of Colace or another stool softener and take twice a day as long as you are requiring postoperative pain medications.  Take with a full glass of water daily.  If you experience loose stools or diarrhea, hold the colace until you stool forms back up.  If your symptoms do not get better within 1 week or if they get worse, check with your doctor. ° °Dulcolax (bisacodyl) - Pick up over-the-counter and take as directed  by the product packaging as needed to assist with the movement of your bowels.  Take with a full glass of water.  Use this product as needed if not relieved by Colace only.  ° °MiraLax (polyethylene glycol) - Pick up over-the-counter to have on hand.  MiraLax is a solution that will increase the amount of water in your bowels to assist with bowel movements.  Take as directed and can mix with a glass of water, juice, soda, coffee, or tea.  Take if you go more than two days without a movement. °Do not use MiraLax more than once per day. Call your doctor if you are still constipated or irregular after using this medication for 7 days in a row. ° °If you continue to have problems with postoperative constipation, please contact the office for further assistance and recommendations.  If you experience "the worst abdominal pain ever" or develop nausea or vomiting, please contact the office immediatly for further recommendations for treatment. ° °ITCHING ° If you experience itching with your medications, try taking only a single pain pill, or even half a pain pill at a time.  You can also use Benadryl over the counter for itching or also to help with sleep.  ° °TED HOSE STOCKINGS °Wear the elastic stockings on both legs for three weeks following surgery during the day but you may remove then at night for sleeping. ° °MEDICATIONS °See your medication summary on the “After Visit Summary” that the nursing staff will review with you prior to discharge.  You may have some home medications which will be placed on hold until you complete the course of blood thinner medication.  It is important for you to complete the blood thinner medication as prescribed by your surgeon.  Continue your approved medications as instructed at time of discharge. ° °PRECAUTIONS °If you experience chest pain or shortness of breath - call 911 immediately for transfer to the hospital emergency department.  °If you develop a fever greater that 101 F,  purulent drainage from wound, increased redness or drainage from wound, foul odor from the wound/dressing, or calf pain - CONTACT YOUR SURGEON.   °                                                °FOLLOW-UP APPOINTMENTS °Make sure you keep all of your appointments after your operation with your surgeon and caregivers. You should call the office at the above phone number and make an appointment for approximately two weeks after the date of your surgery or on the date instructed by your surgeon outlined in the "After Visit Summary". ° °RANGE OF MOTION AND STRENGTHENING EXERCISES  °These exercises are designed to help you keep full movement of your hip joint. Follow your caregiver's or physical therapist's instructions. Perform all exercises about fifteen times, three times per day or as directed. Exercise both hips, even if you   have had only one joint replacement. These exercises can be done on a training (exercise) mat, on the floor, on a table or on a bed. Use whatever works the best and is most comfortable for you. Use music or television while you are exercising so that the exercises are a pleasant break in your day. This will make your life better with the exercises acting as a break in routine you can look forward to.  Lying on your back, slowly slide your foot toward your buttocks, raising your knee up off the floor. Then slowly slide your foot back down until your leg is straight again.  Lying on your back spread your legs as far apart as you can without causing discomfort.  Lying on your side, raise your upper leg and foot straight up from the floor as far as is comfortable. Slowly lower the leg and repeat.  Lying on your back, tighten up the muscle in the front of your thigh (quadriceps muscles). You can do this by keeping your leg straight and trying to raise your heel off the floor. This helps strengthen the largest muscle supporting your knee.  Lying on your back, tighten up the muscles of your  buttocks both with the legs straight and with the knee bent at a comfortable angle while keeping your heel on the floor.   IF YOU ARE TRANSFERRED TO A SKILLED REHAB FACILITY If the patient is transferred to a skilled rehab facility following release from the hospital, a list of the current medications will be sent to the facility for the patient to continue.  When discharged from the skilled rehab facility, please have the facility set up the patient's [place] prior to being released. Also, the skilled facility will be responsible for providing the patient with their medications at time of release from the facility to include their pain medication, the muscle relaxants, and their blood thinner medication. If the patient is still at the rehab facility at time of the two week follow up appointment, the skilled rehab facility will also need to assist the patient in arranging follow up appointment in our office and any transportation needs.  MAKE SURE YOU:  Understand these instructions.  Get help right away if you are not doing well or get worse.    Pick up stool softner and laxative for home use following surgery while on pain medications. Do not submerge incision under water. Please use good hand washing techniques while changing dressing each day. May shower starting three days after surgery. Please use a clean towel to pat the incision dry following showers. Continue to use ice for pain and swelling after surgery. Do not use any lotions or creams on the incision until instructed by your surgeon.  Take Xarelto for two and a half more weeks following discharge from the hospital, then discontinue Xarelto. Once the patient has completed the blood thinner regimen, then take a Baby 81 mg Aspirin daily for three more weeks.  Information on my medicine - XARELTO (Rivaroxaban)  This medication education was reviewed with me or my healthcare representative as part of my discharge  preparation.  The pharmacist that spoke with me during my hospital stay was:  Judie Hollick, [facility]  Why was Xarelto prescribed for you? Xarelto was prescribed for you to reduce the risk of blood clots forming after orthopedic surgery. The medical term for these abnormal blood clots is venous thromboembolism (VTE).  What do you need to know about xarelto ? Take  your Xarelto ONCE DAILY at the same time every day. You may take it either with or without food.  If you have difficulty swallowing the tablet whole, you may crush it and mix in applesauce just prior to taking your dose.  Take Xarelto exactly as prescribed by your doctor and DO NOT stop taking Xarelto without talking to the doctor who prescribed the medication.  Stopping without other VTE prevention medication to take the place of Xarelto may increase your risk of developing a clot.  After discharge, you should have regular check-up appointments with your healthcare provider that is prescribing your Xarelto.    What do you do if you miss a dose? If you miss a dose, take it as soon as you remember on the same day then continue your regularly scheduled once daily regimen the next day. Do not take two doses of Xarelto on the same day.   Important Safety Information A possible side effect of Xarelto is bleeding. You should call your healthcare provider right away if you experience any of the following: ? Bleeding from an injury or your nose that does not stop. ? Unusual colored urine (red or dark brown) or unusual colored stools (red or black). ? Unusual bruising for unknown reasons. ? A serious fall or if you hit your head (even if there is no bleeding).  Some medicines may interact with Xarelto and might increase your risk of bleeding while on Xarelto. To help avoid this, consult your healthcare provider or pharmacist prior to using any new prescription or non-prescription medications, including herbals, vitamins,  non-steroidal anti-inflammatory drugs (NSAIDs) and supplements.  This website has more information on Xarelto: [email or web address]

## 2016-12-06 NOTE — Progress Notes (Signed)
Patient did not meet PT therapy goals, per Barron Alvine to discontinue discharge to home order.

## 2016-12-06 NOTE — Discharge Summary (Signed)
Physician Discharge Summary   Patient ID: Marissa Weber MRN: [id number] DOB/AGE: 1961-03-17 56 y.o.  Admit date: 12/05/2016 Discharge date: 12/07/2016  Primary Diagnosis:  Osteoarthritis of the Left hip.  Admission Diagnoses:  Past Medical History:  Diagnosis Date  . Arthritis    hip  and knees   . Asthma    hx of years ago   . Atrial fibrillation ([place]) 11/2016  . Blood transfusion    hx of years ago   . Cholecystitis   . Dysrhythmia    a fib  . Hypertension    Discharge Diagnoses:   Active Problems:   OA (osteoarthritis) of hip  Estimated body mass index is 39.92 kg/m as calculated from the following:   Height as of this encounter: '4\' 10"'  (1.473 m).   Weight as of this encounter: 86.6 kg (191 lb).  Procedure(s) (LRB): LEFT TOTAL HIP ARTHROPLASTY ANTERIOR APPROACH (Left)   Consults: None  HPI: Marissa Weber is a 56 y.o. female who has advanced end-  stage arthritis of their Left  hip with progressively worsening pain and  dysfunction.The patient has failed nonoperative management and presents for  total hip arthroplasty.   Laboratory Data: Admission on 12/05/2016  Component Date Value Ref Range Status  . WBC 12/06/2016 15.3* 4.0 - 10.5 K/uL Final  . RBC 12/06/2016 5.06  3.87 - 5.11 MIL/uL Final  . Hemoglobin 12/06/2016 11.4* 12.0 - 15.0 g/dL Final  . HCT 12/06/2016 36.5  36.0 - 46.0 % Final  . MCV 12/06/2016 72.1* 78.0 - 100.0 fL Final  . MCH 12/06/2016 22.5* 26.0 - 34.0 pg Final  . MCHC 12/06/2016 31.2  30.0 - 36.0 g/dL Final  . RDW 12/06/2016 18.3* 11.5 - 15.5 % Final  . Platelets 12/06/2016 379  150 - 400 K/uL Final  . Sodium 12/06/2016 131* 135 - 145 mmol/L Final   Comment: DELTA CHECK NOTED REPEATED TO VERIFY   . Potassium 12/06/2016 4.3  3.5 - 5.1 mmol/L Final  . Chloride 12/06/2016 94* 101 - 111 mmol/L Final  . CO2 12/06/2016 26  22 - 32 mmol/L Final  . Glucose, Bld 12/06/2016 147* 65 - 99 mg/dL Final  . BUN 12/06/2016 8  6 - 20 mg/dL Final  .  Creatinine, Ser 12/06/2016 0.70  0.44 - 1.00 mg/dL Final  . Calcium 12/06/2016 9.4  8.9 - 10.3 mg/dL Final  . GFR calc non Af Amer 12/06/2016 >60  >60 mL/min Final  . GFR calc Af Amer 12/06/2016 >60  >60 mL/min Final   Comment: (NOTE) The eGFR has been calculated using the CKD EPI equation. This calculation has not been validated in all clinical situations. eGFR's persistently <60 mL/min signify possible Chronic Kidney Disease.   Georgiann Hahn gap 12/06/2016 11  5 - 15 Final  Hospital Outpatient Visit on 12/04/2016  Component Date Value Ref Range Status  . aPTT 12/04/2016 34  24 - 36 seconds Final  . Sodium 12/04/2016 138  135 - 145 mmol/L Final  . Potassium 12/04/2016 4.2  3.5 - 5.1 mmol/L Final  . Chloride 12/04/2016 101  101 - 111 mmol/L Final  . CO2 12/04/2016 25  22 - 32 mmol/L Final  . Glucose, Bld 12/04/2016 92  65 - 99 mg/dL Final  . BUN 12/04/2016 9  6 - 20 mg/dL Final  . Creatinine, Ser 12/04/2016 0.70  0.44 - 1.00 mg/dL Final  . Calcium 12/04/2016 9.1  8.9 - 10.3 mg/dL Final  . Total Protein 12/04/2016 8.3* 6.5 -  8.1 g/dL Final  . Albumin 12/04/2016 4.1  3.5 - 5.0 g/dL Final  . AST 12/04/2016 59* 15 - 41 U/L Final  . ALT 12/04/2016 63* 14 - 54 U/L Final  . Alkaline Phosphatase 12/04/2016 155* 38 - 126 U/L Final  . Total Bilirubin 12/04/2016 0.5  0.3 - 1.2 mg/dL Final  . GFR calc non Af Amer 12/04/2016 >60  >60 mL/min Final  . GFR calc Af Amer 12/04/2016 >60  >60 mL/min Final   Comment: (NOTE) The eGFR has been calculated using the CKD EPI equation. This calculation has not been validated in all clinical situations. eGFR's persistently <60 mL/min signify possible Chronic Kidney Disease.   . Anion gap 12/04/2016 12  5 - 15 Final  . Prothrombin Time 12/04/2016 14.0  11.4 - 15.2 seconds Final  . INR 12/04/2016 1.07   Final  . ABO/RH(D) 12/04/2016 O POS   Final  . Antibody Screen 12/04/2016 NEG   Final  . Sample Expiration 12/04/2016 12/08/2016   Final  . Extend sample  reason 12/04/2016 NO TRANSFUSIONS OR PREGNANCY IN THE PAST 3 MONTHS   Final  . MRSA, PCR 12/04/2016 NEGATIVE  NEGATIVE Final  . Staphylococcus aureus 12/04/2016 NEGATIVE  NEGATIVE Final   Comment:        The Xpert SA Assay (FDA approved for NASAL specimens in patients over 69 years of age), is one component of a comprehensive surveillance program.  Test performance has been validated by [facility] for patients greater than or equal to 2 year old. It is not intended to diagnose infection nor to guide or monitor treatment.   Admission on 11/27/2016, Discharged on 11/30/2016  Component Date Value Ref Range Status  . HIV Screen 4th Generation wRfx 11/27/2016 Non Reactive  Non Reactive Final   Comment: (NOTE) Performed At: [facility] [place], [place] [id number] Lindon Romp MD [id number]   . Sodium 11/27/2016 136  135 - 145 mmol/L Final  . Potassium 11/27/2016 4.2  3.5 - 5.1 mmol/L Final  . Chloride 11/27/2016 99* 101 - 111 mmol/L Final  . CO2 11/27/2016 23  22 - 32 mmol/L Final  . Glucose, Bld 11/27/2016 108* 65 - 99 mg/dL Final  . BUN 11/27/2016 5* 6 - 20 mg/dL Final  . Creatinine, Ser 11/27/2016 0.75  0.44 - 1.00 mg/dL Final  . Calcium 11/27/2016 9.0  8.9 - 10.3 mg/dL Final  . Total Protein 11/27/2016 7.6  6.5 - 8.1 g/dL Final  . Albumin 11/27/2016 4.0  3.5 - 5.0 g/dL Final  . AST 11/27/2016 103* 15 - 41 U/L Final  . ALT 11/27/2016 70* 14 - 54 U/L Final  . Alkaline Phosphatase 11/27/2016 235* 38 - 126 U/L Final  . Total Bilirubin 11/27/2016 0.4  0.3 - 1.2 mg/dL Final  . GFR calc non Af Amer 11/27/2016 >60  >60 mL/min Final  . GFR calc Af Amer 11/27/2016 >60  >60 mL/min Final   Comment: (NOTE) The eGFR has been calculated using the CKD EPI equation. This calculation has not been validated in all clinical situations. eGFR's persistently <60 mL/min signify possible Chronic Kidney Disease.   . Anion gap 11/27/2016 14  5 - 15 Final  . WBC  11/27/2016 6.7  4.0 - 10.5 K/uL Final   Comment: WHITE COUNT CONFIRMED ON SMEAR REPEATED TO VERIFY   . RBC 11/27/2016 5.46* 3.87 - 5.11 MIL/uL Final  . Hemoglobin 11/27/2016 12.4  12.0 - 15.0 g/dL Final  . HCT 11/27/2016 38.6  36.0 - 46.0 % Final  . MCV 11/27/2016 70.7* 78.0 - 100.0 fL Final  . MCH 11/27/2016 22.7* 26.0 - 34.0 pg Final  . MCHC 11/27/2016 32.1  30.0 - 36.0 g/dL Final  . RDW 11/27/2016 18.8* 11.5 - 15.5 % Final  . Platelets 11/27/2016 299  150 - 400 K/uL Final  . Neutrophils Relative % 11/27/2016 59  % Final  . Lymphocytes Relative 11/27/2016 28  % Final  . Monocytes Relative 11/27/2016 9  % Final  . Eosinophils Relative 11/27/2016 3  % Final  . Basophils Relative 11/27/2016 1  % Final  . Neutro Abs 11/27/2016 3.9  1.7 - 7.7 K/uL Final  . Lymphs Abs 11/27/2016 1.9  0.7 - 4.0 K/uL Final  . Monocytes Absolute 11/27/2016 0.6  0.1 - 1.0 K/uL Final  . Eosinophils Absolute 11/27/2016 0.2  0.0 - 0.7 K/uL Final  . Basophils Absolute 11/27/2016 0.1  0.0 - 0.1 K/uL Final  . RBC Morphology 11/27/2016 TARGET CELLS   Final  . TSH 11/27/2016 1.263  0.350 - 4.500 uIU/mL Final  . Sodium 11/28/2016 135  135 - 145 mmol/L Final  . Potassium 11/28/2016 3.8  3.5 - 5.1 mmol/L Final  . Chloride 11/28/2016 96* 101 - 111 mmol/L Final  . CO2 11/28/2016 30  22 - 32 mmol/L Final  . Glucose, Bld 11/28/2016 106* 65 - 99 mg/dL Final  . BUN 11/28/2016 5* 6 - 20 mg/dL Final  . Creatinine, Ser 11/28/2016 0.66  0.44 - 1.00 mg/dL Final  . Calcium 11/28/2016 9.4  8.9 - 10.3 mg/dL Final  . GFR calc non Af Amer 11/28/2016 >60  >60 mL/min Final  . GFR calc Af Amer 11/28/2016 >60  >60 mL/min Final   Comment: (NOTE) The eGFR has been calculated using the CKD EPI equation. This calculation has not been validated in all clinical situations. eGFR's persistently <60 mL/min signify possible Chronic Kidney Disease.   . Anion gap 11/28/2016 9  5 - 15 Final  . WBC 11/28/2016 6.8  4.0 - 10.5 K/uL Final  . RBC  11/28/2016 5.49* 3.87 - 5.11 MIL/uL Final  . Hemoglobin 11/28/2016 12.2  12.0 - 15.0 g/dL Final  . HCT 11/28/2016 39.0  36.0 - 46.0 % Final  . MCV 11/28/2016 71.0* 78.0 - 100.0 fL Final  . MCH 11/28/2016 22.2* 26.0 - 34.0 pg Final  . MCHC 11/28/2016 31.3  30.0 - 36.0 g/dL Final  . RDW 11/28/2016 18.7* 11.5 - 15.5 % Final  . Platelets 11/28/2016 249  150 - 400 K/uL Final  . Weight 11/28/2016 3089.6  oz Final  . Height 11/28/2016 59  in Final  . BP 11/28/2016 127/72  mmHg Final  . LV PW d 11/28/2016 11.8* 0.6 - 1.1 mm Final  . FS 11/28/2016 36  28 - 44 % Final  . LA vol 11/28/2016 76.9  mL Final  . LA ID, A-P, ES 11/28/2016 40  mm Final  . IVS/LV PW RATIO, ED 11/28/2016 1.19   Final  . LVOT VTI 11/28/2016 31.1  cm Final  . LV e' LATERAL 11/28/2016 8.38  cm/s Final  . LV E/e' medial 11/28/2016 15.63   Final  . LV E/e'average 11/28/2016 15.63   Final  . LA diam index 11/28/2016 2.04  cm/m2 Final  . LA vol A4C 11/28/2016 76.7  ml Final  . LVOT peak grad rest 11/28/2016 6  mmHg Final  . E decel time 11/28/2016 229  msec Final  . LVOT diameter 11/28/2016 24  mm Final  . LVOT area 11/28/2016 4.52  cm2 Final  . LVOT peak vel 11/28/2016 122  cm/s Final  . LVOT SV 11/28/2016 141.00  mL Final  . Peak grad 11/28/2016 7  mmHg Final  . E/e' ratio 11/28/2016 15.63   Final  . MV pk E vel 11/28/2016 131  m/s Final  . MV pk A vel 11/28/2016 96.2  m/s Final  . LA vol index 11/28/2016 39.2  mL/m2 Final  . MV Dec 11/28/2016 229   Final  . LA diam end sys 11/28/2016 40.00  mm Final  . TDI e' medial 11/28/2016 6.64   Final  . TDI e' lateral 11/28/2016 8.38   Final  . Lateral S' vel 11/28/2016 14.90  cm/sec Final  . TAPSE 11/28/2016 30.80  mm Final  . MRSA by PCR 11/27/2016 NEGATIVE  NEGATIVE Final   Comment:        The GeneXpert MRSA Assay (FDA approved for NASAL specimens only), is one component of a comprehensive MRSA colonization surveillance program. It is not intended to diagnose  MRSA infection nor to guide or monitor treatment for MRSA infections.   . WBC 11/30/2016 7.4  4.0 - 10.5 K/uL Final  . RBC 11/30/2016 5.28* 3.87 - 5.11 MIL/uL Final  . Hemoglobin 11/30/2016 11.9* 12.0 - 15.0 g/dL Final  . HCT 11/30/2016 38.0  36.0 - 46.0 % Final  . MCV 11/30/2016 72.0* 78.0 - 100.0 fL Final  . MCH 11/30/2016 22.5* 26.0 - 34.0 pg Final  . MCHC 11/30/2016 31.3  30.0 - 36.0 g/dL Final  . RDW 11/30/2016 18.8* 11.5 - 15.5 % Final  . Platelets 11/30/2016 251  150 - 400 K/uL Final  . Sodium 11/30/2016 134* 135 - 145 mmol/L Final  . Potassium 11/30/2016 3.8  3.5 - 5.1 mmol/L Final  . Chloride 11/30/2016 95* 101 - 111 mmol/L Final  . CO2 11/30/2016 27  22 - 32 mmol/L Final  . Glucose, Bld 11/30/2016 122* 65 - 99 mg/dL Final  . BUN 11/30/2016 <5* 6 - 20 mg/dL Final  . Creatinine, Ser 11/30/2016 0.60  0.44 - 1.00 mg/dL Final  . Calcium 11/30/2016 9.5  8.9 - 10.3 mg/dL Final  . GFR calc non Af Amer 11/30/2016 >60  >60 mL/min Final  . GFR calc Af Amer 11/30/2016 >60  >60 mL/min Final   Comment: (NOTE) The eGFR has been calculated using the CKD EPI equation. This calculation has not been validated in all clinical situations. eGFR's persistently <60 mL/min signify possible Chronic Kidney Disease.   . Anion gap 11/30/2016 12  5 - 15 Final  Admission on 10/17/2016, Discharged on 10/17/2016  Component Date Value Ref Range Status  . WBC 10/17/2016 10.0  4.0 - 10.5 K/uL Final  . RBC 10/17/2016 5.78* 3.87 - 5.11 MIL/uL Final  . Hemoglobin 10/17/2016 13.0  12.0 - 15.0 g/dL Final  . HCT 10/17/2016 41.2  36.0 - 46.0 % Final  . MCV 10/17/2016 71.3* 78.0 - 100.0 fL Final  . MCH 10/17/2016 22.5* 26.0 - 34.0 pg Final  . MCHC 10/17/2016 31.6  30.0 - 36.0 g/dL Final  . RDW 10/17/2016 20.6* 11.5 - 15.5 % Final  . Platelets 10/17/2016 225  150 - 400 K/uL Final  . Neutrophils Relative % 10/17/2016 66  % Final  . Neutro Abs 10/17/2016 6.6  1.7 - 7.7 K/uL Final  . Lymphocytes Relative  10/17/2016 21  % Final  . Lymphs Abs 10/17/2016 2.1  0.7 - 4.0 K/uL Final  .  Monocytes Relative 10/17/2016 10  % Final  . Monocytes Absolute 10/17/2016 1.0  0.1 - 1.0 K/uL Final  . Eosinophils Relative 10/17/2016 2  % Final  . Eosinophils Absolute 10/17/2016 0.2  0.0 - 0.7 K/uL Final  . Basophils Relative 10/17/2016 1  % Final  . Basophils Absolute 10/17/2016 0.1  0.0 - 0.1 K/uL Final  . Sodium 10/17/2016 137  135 - 145 mmol/L Final  . Potassium 10/17/2016 3.7  3.5 - 5.1 mmol/L Final  . Chloride 10/17/2016 103  101 - 111 mmol/L Final  . CO2 10/17/2016 23  22 - 32 mmol/L Final  . Glucose, Bld 10/17/2016 108* 65 - 99 mg/dL Final  . BUN 10/17/2016 10  6 - 20 mg/dL Final  . Creatinine, Ser 10/17/2016 0.70  0.44 - 1.00 mg/dL Final  . Calcium 10/17/2016 10.2  8.9 - 10.3 mg/dL Final  . Total Protein 10/17/2016 7.6  6.5 - 8.1 g/dL Final  . Albumin 10/17/2016 4.0  3.5 - 5.0 g/dL Final  . AST 10/17/2016 26  15 - 41 U/L Final  . ALT 10/17/2016 35  14 - 54 U/L Final  . Alkaline Phosphatase 10/17/2016 115  38 - 126 U/L Final  . Total Bilirubin 10/17/2016 0.3  0.3 - 1.2 mg/dL Final  . GFR calc non Af Amer 10/17/2016 >60  >60 mL/min Final  . GFR calc Af Amer 10/17/2016 >60  >60 mL/min Final   Comment: (NOTE) The eGFR has been calculated using the CKD EPI equation. This calculation has not been validated in all clinical situations. eGFR's persistently <60 mL/min signify possible Chronic Kidney Disease.   . Anion gap 10/17/2016 11  5 - 15 Final  . Troponin i, poc 10/17/2016 0.00  0.00 - 0.08 ng/mL Final  . Comment 3 10/17/2016          Final   Comment: Due to the release kinetics of cTnI, a negative result within the first hours of the onset of symptoms does not rule out myocardial infarction with certainty. If myocardial infarction is still suspected, repeat the test at appropriate intervals.      X-Rays:X-ray Chest Pa And Lateral  Result Date: 11/27/2016 CLINICAL DATA:  Hypertension,  atrial fibrillation EXAM: CHEST  2 VIEW COMPARISON:  10/17/2016 FINDINGS: Minimal scarring or atelectasis at the lung bases. Borderline to mild cardiomegaly without overt failure. Low lung volume. No acute infiltrate or effusion. Degenerative changes of the spine. IMPRESSION: Minimal basilar atelectasis or scar. No acute infiltrate or edema. Borderline cardiomegaly. Electronically Signed   By: Donavan Foil M.D.   On: 11/27/2016 23:34   Dg Pelvis Portable  Result Date: 12/05/2016 CLINICAL DATA:  Status post left hip joint replacement. EXAM: PORTABLE PELVIS 1-2 VIEWS COMPARISON:  Portable pelvis radiograph of October 22, 2011 FINDINGS: The patient has undergone left hip joint prosthesis placement. Radiographic positioning of the prosthetic components appears good. The interface with the native bone is normal. A surgical drain line is present. A pre-existing right hip prosthesis is unremarkable. IMPRESSION: No postprocedure complication following left hip joint prosthesis placement. Electronically Signed   By: David  Martinique M.D.   On: 12/05/2016 14:37   Nm Myocar Multi W/spect W/wall Motion / Ef  Result Date: 11/29/2016 CLINICAL DATA:  Eft hypertension, palpitations, chest pain, atrial fibrillation, asthma. EXAM: MYOCARDIAL IMAGING WITH SPECT (REST AND PHARMACOLOGIC-STRESS) GATED LEFT VENTRICULAR WALL MOTION STUDY LEFT VENTRICULAR EJECTION FRACTION TECHNIQUE: Standard myocardial SPECT imaging was performed after resting intravenous injection of 10 mCi Tc-22msestamibi. Subsequently, intravenous  infusion of Lexiscan was performed under the supervision of the Cardiology staff. At peak effect of the drug, 30 mCi Tc-42msestamibi was injected intravenously and standard myocardial SPECT imaging was performed. Quantitative gated imaging was also performed to evaluate left ventricular wall motion, and estimate left ventricular ejection fraction. COMPARISON:  None. FINDINGS: Perfusion: No decreased activity in the  left ventricle on stress imaging to suggest reversible ischemia or infarction. There is mild apical thinning. Wall Motion: Normal left ventricular wall motion. No left ventricular dilation. Left Ventricular Ejection Fraction: 63 % End diastolic volume 98 ml End systolic volume 37 ml IMPRESSION: 1. No reversible ischemia or infarction. 2. Normal left ventricular wall motion. 3. Left ventricular ejection fraction 63% 4. Low-risk stress test findings*. *2012 Appropriate Use Criteria for Coronary Revascularization Focused Update: J Am Coll Cardiol. 28099;83(3):825-053 http://content.oairportbarriers.comaspx?articleid=[email or web address] Electronically Signed   By: DLucrezia EuropeM.D.   On: 11/29/2016 14:21   Dg C-arm 1-60 Min-no Report  Result Date: 12/05/2016 Fluoroscopy was utilized by the requesting physician.  No radiographic interpretation.    EKG: Orders placed or performed during the hospital encounter of 11/27/16  . EKG test  . EKG test  . EKG 12-Lead  . EKG 12-Lead  . EKG 12-Lead  . EKG 12-Lead     [facility]: [facility] the above state procedure without complications.  Patient tolerated the procedure well and was later transferred to the recovery room and then to the orthopaedic floor for postoperative care.  They were given PO and IV analgesics for pain control following their surgery.  They were given 24 hours of postoperative antibiotics of  Anti-infectives    Start     Dose/Rate Route Frequency Ordered Stop   12/05/16 1800  ceFAZolin (ANCEF) IVPB 2g/100 mL premix     2 g 200 mL/hr over 30 Minutes Intravenous Every 6 hours 12/05/16 1537 12/06/16 0136   12/05/16 1014  ceFAZolin (ANCEF) 2-4 GM/100ML-% IVPB    Comments:  SBridget Hartshorn  : cabinet override      12/05/16 1014 12/05/16 1212   12/05/16 1013  ceFAZolin (ANCEF) IVPB 2g/100 mL premix     2 g 200 mL/hr over 30 Minutes Intravenous On call to O.R. 12/05/16 1013  12/05/16 1212     and started on DVT prophylaxis in the form of Xarelto.   PT and OT were ordered for total hip protocol.  The patient was allowed to be WBAT with therapy. Discharge planning was consulted to help with postop disposition and equipment needs.  Patient had a decent night on the evening of surgery.  They started to get up OOB with therapy on day one.  Hemovac drain was pulled without difficulty.  Continued to work with therapy into day two.  Dressing was changed on day two and the incision was healing well.   Patient was seen in rounds on day two and was ready to go home.  Discharge home with home health Diet - Cardiac diet Follow up - in 2 weeks Activity - WBAT Disposition - Home Condition Upon Discharge - improving D/C Meds - See DC Summary DVT Prophylaxis - Xarelto   Discharge Instructions    Call MD / Call 911    Complete by:  As directed    If you experience chest pain or shortness of breath, CALL 911 and be transported to the hospital emergency room.  If you develope a fever above 101  F, pus (white drainage) or increased drainage or redness at the wound, or calf pain, call your surgeon's office.   Change dressing    Complete by:  As directed    You may change your dressing dressing daily with sterile 4 x 4 inch gauze dressing and paper tape.  Do not submerge the incision under water.   Constipation Prevention    Complete by:  As directed    Drink plenty of fluids.  Prune juice may be helpful.  You may use a stool softener, such as Colace (over the counter) 100 mg twice a day.  Use MiraLax (over the counter) for constipation as needed.   Diet - low sodium heart healthy    Complete by:  As directed    Discharge instructions    Complete by:  As directed    Pick up stool softner and laxative for home use following surgery while on pain medications. Do not submerge incision under water. Please use good hand washing techniques while changing dressing each day. May shower  starting three days after surgery. Please use a clean towel to pat the incision dry following showers. Continue to use ice for pain and swelling after surgery. Do not use any lotions or creams on the incision until instructed by your surgeon.  Wear both TED hose on both legs during the day every day for three weeks, but may have off at night at home.  Postoperative Constipation Protocol  Constipation - defined medically as fewer than three stools per week and severe constipation as less than one stool per week.  One of the most common issues patients have following surgery is constipation.  Even if you have a regular bowel pattern at home, your normal regimen is likely to be disrupted due to multiple reasons following surgery.  Combination of anesthesia, postoperative narcotics, change in appetite and fluid intake all can affect your bowels.  In order to avoid complications following surgery, here are some recommendations in order to help you during your recovery period.  Colace (docusate) - Pick up an over-the-counter form of Colace or another stool softener and take twice a day as long as you are requiring postoperative pain medications.  Take with a full glass of water daily.  If you experience loose stools or diarrhea, hold the colace until you stool forms back up.  If your symptoms do not get better within 1 week or if they get worse, check with your doctor.  Dulcolax (bisacodyl) - Pick up over-the-counter and take as directed by the product packaging as needed to assist with the movement of your bowels.  Take with a full glass of water.  Use this product as needed if not relieved by Colace only.   MiraLax (polyethylene glycol) - Pick up over-the-counter to have on hand.  MiraLax is a solution that will increase the amount of water in your bowels to assist with bowel movements.  Take as directed and can mix with a glass of water, juice, soda, coffee, or tea.  Take if you go more than two days  without a movement. Do not use MiraLax more than once per day. Call your doctor if you are still constipated or irregular after using this medication for 7 days in a row.  If you continue to have problems with postoperative constipation, please contact the office for further assistance and recommendations.  If you experience "the worst abdominal pain ever" or develop nausea or vomiting, please contact the office immediatly for further  recommendations for treatment.   Take Xarelto for two and a half more weeks, then discontinue Xarelto. Once the patient has completed the blood thinner regimen, then take a Baby 81 mg Aspirin daily for three more weeks.   Do not sit on low chairs, stoools or toilet seats, as it may be difficult to get up from low surfaces    Complete by:  As directed    Driving restrictions    Complete by:  As directed    No driving until released by the physician.   Increase activity slowly as tolerated    Complete by:  As directed    Lifting restrictions    Complete by:  As directed    No lifting until released by the physician.   Patient may shower    Complete by:  As directed    You may shower without a dressing once there is no drainage.  Do not wash over the wound.  If drainage remains, do not shower until drainage stops.   TED hose    Complete by:  As directed    Use stockings (TED hose) for 3 weeks on both leg(s).  You may remove them at night for sleeping.   Weight bearing as tolerated    Complete by:  As directed    Laterality:  left   Extremity:  Lower     Allergies as of 12/06/2016      Reactions   Bee Venom Swelling, Other (See Comments)   Swelling wherever the sting is. Also gets dizzy   Lisinopril Other (See Comments)   swelling      Medication List    STOP taking these medications   EXCEDRIN EXTRA STRENGTH PO   Melatonin 3 MG Caps     TAKE these medications   albuterol 108 (90 Base) MCG/ACT inhaler Commonly known as:  PROVENTIL HFA;VENTOLIN  HFA Inhale 2 puffs into the lungs every 4 (four) hours as needed for wheezing or shortness of breath.   amLODipine 5 MG tablet Commonly known as:  NORVASC Take 5 mg by mouth daily.   diltiazem 60 MG tablet Commonly known as:  CARDIZEM Take 0.5 tablets (30 mg total) by mouth 2 (two) times daily.   methocarbamol 500 MG tablet Commonly known as:  ROBAXIN Take 1 tablet (500 mg total) by mouth every 6 (six) hours as needed for muscle spasms.   metoprolol tartrate 25 MG tablet Commonly known as:  LOPRESSOR Take 1 tablet (25 mg total) by mouth 2 (two) times daily.   oxyCODONE 5 MG immediate release tablet Commonly known as:  Oxy IR/ROXICODONE Take 1-2 tablets (5-10 mg total) by mouth every 4 (four) hours as needed for moderate pain or severe pain.   rivaroxaban 10 MG Tabs tablet Commonly known as:  XARELTO Take 1 tablet (10 mg total) by mouth daily with breakfast. Take Xarelto for two and a half more weeks following discharge from the hospital, then discontinue Xarelto. Once the patient has completed the blood thinner regimen, then take a Baby 81 mg Aspirin daily for three more weeks.   traMADol 50 MG tablet Commonly known as:  ULTRAM Take 1-2 tablets (50-100 mg total) by mouth every 6 (six) hours as needed for moderate pain. What changed:  how much to take  reasons to take this      Follow-up Information    Gearlean Alf, MD. Schedule an appointment as soon as possible for a visit on 12/18/2016.   Specialty:  Orthopedic Surgery Contact  information: [address] [place] [address] [phone]           Signed: Arlee Muslim, PA-C Orthopaedic Surgery 12/06/2016, 8:28 AM

## 2016-12-06 NOTE — Progress Notes (Signed)
   Subjective: 1 Day Post-Op Procedure(s) (LRB): LEFT TOTAL HIP ARTHROPLASTY ANTERIOR APPROACH (Left) Patient reports pain as mild and moderate.   Patient seen in rounds by Dr. Wynelle Link. Patient is well, but has had some minor complaints of pain in the hip and thigh, requiring pain medications We will start therapy today.  If they do well with therapy and meets all goals, then will allow home later this afternoon following therapy. Plan is to go Home after hospital stay.  Objective: Vital signs in last 24 hours: Temp:  [97.7 F (36.5 C)-98.6 F (37 C)] 98.5 F (36.9 C) (04/12 0604) Pulse Rate:  [65-82] 65 (04/12 0604) Resp:  [14-20] 15 (04/12 0604) BP: (102-146)/(72-94) 126/81 (04/12 0604) SpO2:  [92 %-98 %] 96 % (04/12 0604) Weight:  [86.6 kg (191 lb)] 86.6 kg (191 lb) (04/11 1027)  Intake/Output from previous day:  Intake/Output Summary (Last 24 hours) at 12/06/16 0813 Last data filed at 12/06/16 6440  Gross per 24 hour  Intake           3842.5 ml  Output             3275 ml  Net            567.5 ml    Intake/Output this shift: No intake/output data recorded.  Labs:  Recent Labs  12/06/16 0452  HGB 11.4*    Recent Labs  12/06/16 0452  WBC 15.3*  RBC 5.06  HCT 36.5  PLT 379    Recent Labs  12/04/16 0922 12/06/16 0707  NA 138 131*  K 4.2 4.3  CL 101 94*  CO2 25 26  BUN 9 8  CREATININE 0.70 0.70  GLUCOSE 92 147*  CALCIUM 9.1 9.4    Recent Labs  12/04/16 0922  INR 1.07    EXAM General - Patient is Alert, Appropriate and Oriented Extremity - Neurovascular intact Sensation intact distally Intact pulses distally Dorsiflexion/Plantar flexion intact Dressing - dressing C/D/I Motor Function - intact, moving foot and toes well on exam.  Hemovac pulled without difficulty.  Past Medical History:  Diagnosis Date  . Arthritis    hip  and knees   . Asthma    hx of years ago   . Atrial fibrillation ([place]) 11/2016  . Blood transfusion    hx of  years ago   . Cholecystitis   . Dysrhythmia    a fib  . Hypertension     Assessment/Plan: 1 Day Post-Op Procedure(s) (LRB): LEFT TOTAL HIP ARTHROPLASTY ANTERIOR APPROACH (Left) Active Problems:   OA (osteoarthritis) of hip  Estimated body mass index is 39.92 kg/m as calculated from the following:   Height as of this encounter: 4\' 10"  (1.473 m).   Weight as of this encounter: 86.6 kg (191 lb). Advance diet Up with therapy Discharge home with home health  DVT Prophylaxis - Xarelto Weight Bearing As Tolerated left Leg Hemovac Pulled Begin Therapy HGB 11.4  If meets goals and able to go home: Up with therapy Discharge home with home health if doing well and meets goals Diet - Cardiac diet Follow up - in 2 weeks Activity - WBAT Disposition - Home Condition Upon Discharge - pending D/C Meds - See DC Summary DVT Prophylaxis - [place], PA-C Orthopaedic Surgery 12/06/2016, 8:13 AM

## 2016-12-06 NOTE — Progress Notes (Signed)
PT Cancellation Note  Patient Details Name: Marissa Weber MRN: [id number] DOB: [birth year]   Cancelled Treatment:    Reason Eval/Treat Not Completed: Attempted PT tx session. Pt declined to participate.    Weston Anna, MPT Pager: [phone]

## 2016-12-06 NOTE — Care Management Note (Signed)
Case Management Note  Patient Details  Name: Marissa Weber MRN: [id number] Date of Birth: 19-May-1961  Subjective/Objective:     55 yo admitted for s/p L THA-direct anterior               Action/Plan: From home with spouse. Pt states she has a RW at home and is requesting a 3in1. Order received and [facility] DME rep contacted. [facility] chosen for [place] and [facility] rep aware of referral. No other CM needs communicated.  Expected Discharge Date:  12/06/16               Expected Discharge Plan:  [place]  In-House Referral:     Discharge planning Services  CM Consult  Post Acute Care Choice:  Home Health Choice offered to:  Patient  DME Arranged:  3-N-1 DME Agency:  [place]:  PT [place]:  [facility] (now [facility])  Status of Service:  Completed, signed off  If discussed at [place] of Stay Meetings, dates discussed:    Additional Comments:  Lynnell Catalan, RN 12/06/2016, 11:31 AM  [phone]

## 2016-12-07 LAB — CBC
HCT: 35.9 % — ABNORMAL LOW (ref 36.0–46.0)
HEMOGLOBIN: 11.3 g/dL — AB (ref 12.0–15.0)
MCH: 22.4 pg — AB (ref 26.0–34.0)
MCHC: 31.5 g/dL (ref 30.0–36.0)
MCV: 71.1 fL — ABNORMAL LOW (ref 78.0–100.0)
Platelets: 286 10*3/uL (ref 150–400)
RBC: 5.05 MIL/uL (ref 3.87–5.11)
RDW: 18.4 % — ABNORMAL HIGH (ref 11.5–15.5)
WBC: 19.9 10*3/uL — ABNORMAL HIGH (ref 4.0–10.5)

## 2016-12-07 LAB — BASIC METABOLIC PANEL
Anion gap: 7 (ref 5–15)
BUN: 10 mg/dL (ref 6–20)
CALCIUM: 9.3 mg/dL (ref 8.9–10.3)
CHLORIDE: 97 mmol/L — AB (ref 101–111)
CO2: 28 mmol/L (ref 22–32)
CREATININE: 0.6 mg/dL (ref 0.44–1.00)
GFR calc Af Amer: >60 mL/min (ref >60)
GFR calc non Af Amer: >60 mL/min (ref >60)
Glucose, Bld: 130 mg/dL — ABNORMAL HIGH (ref 65–99)
Potassium: 4 mmol/L (ref 3.5–5.1)
SODIUM: 132 mmol/L — AB (ref 135–145)

## 2016-12-07 NOTE — Progress Notes (Signed)
Physical Therapy Treatment Patient Details Name: Marissa Weber MRN: [id number] DOB: [birth year] Today's Date: 12/07/2016    History of Present Illness 56 yo female s/p L THA-direct anterior 12/05/16. Hx of A fib, obesity, RA, asthma    PT Comments    Most of session focused on stair negotiation. Pain limiting pt's ability to put weight through L LE which in turn made getting up steps very difficult. 3 attempts required but pt was able to successfully get up 2 steps. Noticed that 2 wheeled walker had not been delivered to pt's room (recommended on day of eval). Pt has a 4 wheeled rolling walker at home not a 2 wheeled walker. Made RN aware of this and asked her to see if CM could bring walker prior to d/c today. Instructed pt and husband NOT to use 4 wheeled walker on steps. Husband stated he will have plenty of help to get pt into house. Issued stair negotiation handout.  All education completed. Pt to d/c home today.   Follow Up Recommendations  Home health PT;Supervision/Assistance - 24 hour     Equipment Recommendations  Rolling walker with 5" wheels (youth height)    Recommendations for Other Services       Precautions / Restrictions Precautions Precautions: Fall Restrictions Weight Bearing Restrictions: No LLE Weight Bearing: Weight bearing as tolerated    Mobility  Bed Mobility Overal bed mobility: Needs Assistance Bed Mobility: Supine to Sit     Supine to sit: Min assist     General bed mobility comments: Assist for L LE. Increased time. Pt used bedrail.  Transfers Overall transfer level: Needs assistance Equipment used: Rolling walker (2 wheeled) Transfers: Sit to/from Stand Sit to Stand: Min assist         General transfer comment: Assist to rise, stabilize, control descent. VCs safety, hand placement.   Ambulation/Gait Ambulation/Gait assistance: Min guard Ambulation Distance (Feet): 40 Feet Assistive device: Rolling walker (2 wheeled) Gait  Pattern/deviations: Step-to pattern;Antalgic     General Gait Details: close guard for safety. 2 brief standing rest breaks taken. distance limited by pain   Stairs Stairs: Yes   Stair Management: Backwards;With walker Number of Stairs: 2 Mod assist General stair comments: 3 attempts for pt to get up 2 steps. Tried with RW backwards x2 and with cane and 1 HHA x1. On 3rd attempt, pt was able to get up 2 steps with use of RW. Husband present to observe and stabilize walker. VCs safety, technique, sequence. Pt hesistant to put full weight through LE due to pain-this was inhibiting her ability to get up steps.   Wheelchair Mobility    Modified Rankin (Stroke Patients Only)       Balance                                            Cognition Arousal/Alertness: Awake/alert Behavior During Therapy: WFL for tasks assessed/performed Overall Cognitive Status: Within Functional Limits for tasks assessed                                        Exercises      General Comments        Pertinent Vitals/Pain Pain Assessment: 0-10 Pain Score: 10-Worst pain ever Pain Location: L hip/thigh Pain Descriptors / Indicators: Aching;Sore  Pain Intervention(s): Limited activity within patient's tolerance    Home Living                      Prior Function            PT Goals (current goals can now be found in the care plan section) Progress towards PT goals: Progressing toward goals    Frequency    7X/week      PT Plan Current plan remains appropriate    Co-evaluation             End of Session Equipment Utilized During Treatment: Gait belt Activity Tolerance: Patient limited by fatigue;Patient limited by pain Patient left: in bed;with call bell/phone within reach;with family/visitor present   PT Visit Diagnosis: Muscle weakness (generalized) (M62.81);Difficulty in walking, not elsewhere classified (R26.2)     Time:  9432-7614 PT Time Calculation (min) (ACUTE ONLY): 29 min  Charges:  $Gait Training: 23-37 mins                    G Codes:         Weston Anna, MPT Pager: [phone]

## 2016-12-07 NOTE — Progress Notes (Signed)
Pt now states that the RW she has at home is 4wheeled with brakes and she is requesting a 2wheeled youth size. Order received and [facility] DME rep called for walker. No other CM needs communicated. Marney Doctor RN,BSN, [place] [phone]

## 2016-12-07 NOTE — Progress Notes (Signed)
   Subjective: 2 Days Post-Op Procedure(s) (LRB): LEFT TOTAL HIP ARTHROPLASTY ANTERIOR APPROACH (Left) Patient reports pain as moderate.   Patient seen in rounds with Dr. Wynelle Link.  Family in room at bedside. Patient is well, but has had some minor complaints of pain in the hip and thigh, requiring pain medications Patient is ready to go home later today after meets therapy goals.    Objective: Vital signs in last 24 hours: Temp:  [97.8 F (36.6 C)-98.4 F (36.9 C)] 98.2 F (36.8 C) (04/13 0515) Pulse Rate:  [61-76] 71 (04/13 0515) Resp:  [16-20] 16 (04/13 0515) BP: (143-157)/(76-99) 150/99 (04/13 0515) SpO2:  [93 %-99 %] 98 % (04/13 0515)  Intake/Output from previous day:  Intake/Output Summary (Last 24 hours) at 12/07/16 0724 Last data filed at 12/07/16 0600  Gross per 24 hour  Intake             2451 ml  Output              750 ml  Net             1701 ml    Intake/Output this shift: No intake/output data recorded.  Labs:  Recent Labs  12/06/16 0452 12/07/16 0431  HGB 11.4* 11.3*    Recent Labs  12/06/16 0452 12/07/16 0431  WBC 15.3* 19.9*  RBC 5.06 5.05  HCT 36.5 35.9*  PLT 379 286    Recent Labs  12/06/16 0707 12/07/16 0431  NA 131* 132*  K 4.3 4.0  CL 94* 97*  CO2 26 28  BUN 8 10  CREATININE 0.70 0.60  GLUCOSE 147* 130*  CALCIUM 9.4 9.3    Recent Labs  12/04/16 0922  INR 1.07    EXAM: General - Patient is Alert, Appropriate and Oriented Extremity - Neurovascular intact Sensation intact distally Intact pulses distally Dorsiflexion/Plantar flexion intact Incision - clean, dry, no drainage Motor Function - intact, moving foot and toes well on exam.   Assessment/Plan: 2 Days Post-Op Procedure(s) (LRB): LEFT TOTAL HIP ARTHROPLASTY ANTERIOR APPROACH (Left) Procedure(s) (LRB): LEFT TOTAL HIP ARTHROPLASTY ANTERIOR APPROACH (Left) Past Medical History:  Diagnosis Date  . Arthritis    hip  and knees   . Asthma    hx of years ago     . Atrial fibrillation ([place]) 11/2016  . Blood transfusion    hx of years ago   . Cholecystitis   . Dysrhythmia    a fib  . Hypertension    Active Problems:   OA (osteoarthritis) of hip  Estimated body mass index is 39.92 kg/m as calculated from the following:   Height as of this encounter: 4\' 10"  (1.473 m).   Weight as of this encounter: 86.6 kg (191 lb). Up with therapy Discharge home with home health Diet - Cardiac diet Follow up - in 2 weeks Activity - WBAT Disposition - Home Condition Upon Discharge - improving D/C Meds - See DC Summary DVT Prophylaxis - Xarelto  Arlee Muslim, PA-C Orthopaedic Surgery 12/07/2016, 7:24 AM

## 2016-12-07 NOTE — Progress Notes (Signed)
Physical Therapy Treatment Patient Details Name: Marissa Weber MRN: [id number] DOB: 1961-04-11 Today's Date: 12/07/2016    History of Present Illness 56 yo female s/p L THA-direct anterior 12/05/16. Hx of A fib, obesity, RA, asthma    PT Comments    Progressing slowly with mobility. Pt c/o 10/10 pain with activity. Will plan to have a 2nd session to practice stair negotiation for possible d/c later today.    Follow Up Recommendations  Home health PT;Supervision/Assistance - 24 hour     Equipment Recommendations  Rolling walker with 5" wheels (youth height)    Recommendations for Other Services OT consult     Precautions / Restrictions Precautions Precautions: Fall Restrictions Weight Bearing Restrictions: No LLE Weight Bearing: Weight bearing as tolerated    Mobility  Bed Mobility Overal bed mobility: Needs Assistance Bed Mobility: Supine to Sit     Supine to sit: HOB elevated;Min assist     General bed mobility comments: Assist for L LE. Increased time. Pt used bedrail.  Transfers Overall transfer level: Needs assistance Equipment used: Rolling walker (2 wheeled) Transfers: Sit to/from Stand Sit to Stand: Min assist         General transfer comment: Assist to rise, stabilize, control descent. VCs safety, hand placement.   Ambulation/Gait Ambulation/Gait assistance: Min guard Ambulation Distance (Feet): 60 Feet Assistive device: Rolling walker (2 wheeled) Gait Pattern/deviations: Step-to pattern;Decreased step length - left     General Gait Details: Cues for step length, flat foot instead of staying on toes, sequence. close guard for safety.    Stairs            Wheelchair Mobility    Modified Rankin (Stroke Patients Only)       Balance                                            Cognition Arousal/Alertness: Awake/alert Behavior During Therapy: WFL for tasks assessed/performed Overall Cognitive Status: Within Functional  Limits for tasks assessed                                        Exercises Total Joint Exercises Ankle Circles/Pumps: AROM;Both;10 reps;Supine Quad Sets: AROM;Both;10 reps;Supine Heel Slides: AAROM;Left;10 reps;Supine Hip ABduction/ADduction: AAROM;Left;10 reps;Supine    General Comments        Pertinent Vitals/Pain Pain Assessment: 0-10 Pain Score: 10-Worst pain ever Pain Location: L hip Pain Descriptors / Indicators: Aching;Sore Pain Intervention(s): Limited activity within patient's tolerance    Home Living Family/patient expects to be discharged to:: Private residence Living Arrangements: Spouse/significant other Available Help at Discharge: Family         Home Equipment: Gilford Rile - 4 wheels;Cane - single point Additional Comments: 3:1 delivered to room    Prior Function Level of Independence: Independent with assistive device(s)      Comments: using cane or rollator as needed   PT Goals (current goals can now be found in the care plan section) Acute Rehab PT Goals Patient Stated Goal: less pain Progress towards PT goals: Progressing toward goals    Frequency           PT Plan Current plan remains appropriate    Co-evaluation             End of Session Equipment Utilized During  Treatment: Gait belt Activity Tolerance: Patient limited by fatigue;Patient limited by pain Patient left:  (OT took over session)   PT Visit Diagnosis: Muscle weakness (generalized) (M62.81);Difficulty in walking, not elsewhere classified (R26.2)     Time: 4818-5631 PT Time Calculation (min) (ACUTE ONLY): 11 min  Charges:  $Gait Training: 8-22 mins                    G Codes:          Weston Anna, MPT Pager: [phone]

## 2016-12-07 NOTE — Evaluation (Signed)
Occupational Therapy Evaluation Patient Details Name: Marissa Weber MRN: [id number] DOB: 08-12-1961 Today's Date: 12/07/2016    History of Present Illness 56 yo female s/p L THA-direct anterior 12/05/16. Hx of A fib, obesity, RA, asthma   Clinical Impression   Pt was admitted for the above. All education was completed. No further OT is needed at this time    Follow Up Recommendations  Supervision/Assistance - 24 hour    Equipment Recommendations  3 in 1 bedside commode (delivered)    Recommendations for Other Services       Precautions / Restrictions Precautions Precautions: Fall Restrictions Weight Bearing Restrictions: No LLE Weight Bearing: Weight bearing as tolerated      Mobility Bed Mobility                  Transfers   Equipment used: Rolling walker (2 wheeled)   Sit to Stand: Min assist         General transfer comment: per PT:  took over from PT at end of that session.  Pt did not need to use the toilet    Balance                                           ADL either performed or assessed with clinical judgement   ADL Overall ADL's : Needs assistance/impaired     Grooming: Supervision/safety;Standing   Upper Body Bathing: Set up;Sitting   Lower Body Bathing: Moderate assistance;Sit to/from stand   Upper Body Dressing : Set up;Sitting   Lower Body Dressing: Maximal assistance;Sit to/from stand   Toilet Transfer: Minimal assistance;Ambulation;RW (bed)   Toileting- Clothing Manipulation and Hygiene: Minimal assistance;Sit to/from stand (after standing)         General ADL Comments: educated on AE and safety with RW. Pt feels she will have her husband assist her at home. She plans to sponge bathe initially. Educated on tub DME vs readiness to step over tub     Vision         Perception     Praxis      Pertinent Vitals/Pain Pain Score: 8  Pain Location: L hip Pain Descriptors / Indicators: Aching;Sore Pain  Intervention(s): Limited activity within patient's tolerance;Monitored during session;Premedicated before session;Repositioned;Ice applied     Hand Dominance     Extremity/Trunk Assessment Upper Extremity Assessment Upper Extremity Assessment: Generalized weakness           Communication Communication Communication: No difficulties   Cognition Arousal/Alertness: Awake/alert Behavior During Therapy: WFL for tasks assessed/performed Overall Cognitive Status: Within Functional Limits for tasks assessed                                     General Comments       Exercises     Shoulder Instructions      Home Living Family/patient expects to be discharged to:: Private residence Living Arrangements: Spouse/significant other Available Help at Discharge: Family               Bathroom Shower/Tub: Walk-in Psychologist, prison and probation services: Standard     Home Equipment: Environmental consultant - 4 wheels;Cane - single point   Additional Comments: 3:1 delivered to room      Prior Functioning/Environment Level of Independence: Independent with assistive device(s)  Comments: using cane or rollator as needed        OT Problem List:        OT Treatment/Interventions:      OT Goals(Current goals can be found in the care plan section) Acute Rehab OT Goals Patient Stated Goal: less pain OT Goal Formulation: All assessment and education complete, DC therapy  OT Frequency:     Barriers to D/C:            Co-evaluation              End of Session    Activity Tolerance: Patient tolerated treatment well Patient left: in bed;with call bell/phone within reach;with family/visitor present  OT Visit Diagnosis: Pain Pain - Right/Left: Left Pain - part of body: Hip                Time: 3300-7622 OT Time Calculation (min): 14 min Charges:  OT General Charges $OT Visit: 1 Procedure OT Evaluation $OT Eval Low Complexity: 1 Procedure G-Codes:     Lesle Chris, OTR/L [phone] 12/07/2016  Marissa Weber 12/07/2016, 9:50 AM

## 2016-12-16 ENCOUNTER — Emergency Department (HOSPITAL_COMMUNITY): Payer: Medicaid Other

## 2016-12-16 ENCOUNTER — Inpatient Hospital Stay (HOSPITAL_COMMUNITY)
Admission: EM | Admit: 2016-12-16 | Discharge: 2016-12-18 | DRG: 536 | Disposition: A | Discharge status: Skilled Nursing Facility | Payer: Medicaid Other | Attending: Family Medicine | Admitting: Family Medicine

## 2016-12-16 ENCOUNTER — Encounter (HOSPITAL_COMMUNITY): Payer: Self-pay | Admitting: Emergency Medicine

## 2016-12-16 DIAGNOSIS — W1830XA Fall on same level, unspecified, initial encounter: Secondary | ICD-10-CM | POA: Diagnosis present

## 2016-12-16 DIAGNOSIS — M1712 Unilateral primary osteoarthritis, left knee: Secondary | ICD-10-CM | POA: Diagnosis present

## 2016-12-16 DIAGNOSIS — E871 Hypo-osmolality and hyponatremia: Secondary | ICD-10-CM

## 2016-12-16 DIAGNOSIS — S72112D Displaced fracture of greater trochanter of left femur, subsequent encounter for closed fracture with routine healing: Secondary | ICD-10-CM | POA: Diagnosis not present

## 2016-12-16 DIAGNOSIS — M9702XA Periprosthetic fracture around internal prosthetic left hip joint, initial encounter: Secondary | ICD-10-CM | POA: Diagnosis present

## 2016-12-16 DIAGNOSIS — D638 Anemia in other chronic diseases classified elsewhere: Secondary | ICD-10-CM | POA: Diagnosis not present

## 2016-12-16 DIAGNOSIS — I10 Essential (primary) hypertension: Secondary | ICD-10-CM | POA: Diagnosis present

## 2016-12-16 DIAGNOSIS — Z888 Allergy status to other drugs, medicaments and biological substances status: Secondary | ICD-10-CM

## 2016-12-16 DIAGNOSIS — E878 Other disorders of electrolyte and fluid balance, not elsewhere classified: Secondary | ICD-10-CM | POA: Diagnosis present

## 2016-12-16 DIAGNOSIS — R7989 Other specified abnormal findings of blood chemistry: Secondary | ICD-10-CM | POA: Diagnosis present

## 2016-12-16 DIAGNOSIS — S72112A Displaced fracture of greater trochanter of left femur, initial encounter for closed fracture: Principal | ICD-10-CM

## 2016-12-16 DIAGNOSIS — Z6841 Body Mass Index (BMI) 40.0 and over, adult: Secondary | ICD-10-CM | POA: Diagnosis not present

## 2016-12-16 DIAGNOSIS — I480 Paroxysmal atrial fibrillation: Secondary | ICD-10-CM | POA: Diagnosis present

## 2016-12-16 DIAGNOSIS — Z9049 Acquired absence of other specified parts of digestive tract: Secondary | ICD-10-CM

## 2016-12-16 DIAGNOSIS — S72009A Fracture of unspecified part of neck of unspecified femur, initial encounter for closed fracture: Secondary | ICD-10-CM | POA: Diagnosis present

## 2016-12-16 DIAGNOSIS — F17210 Nicotine dependence, cigarettes, uncomplicated: Secondary | ICD-10-CM | POA: Diagnosis present

## 2016-12-16 DIAGNOSIS — E669 Obesity, unspecified: Secondary | ICD-10-CM | POA: Diagnosis present

## 2016-12-16 DIAGNOSIS — D72829 Elevated white blood cell count, unspecified: Secondary | ICD-10-CM | POA: Diagnosis not present

## 2016-12-16 DIAGNOSIS — J45909 Unspecified asthma, uncomplicated: Secondary | ICD-10-CM | POA: Diagnosis present

## 2016-12-16 DIAGNOSIS — Z79899 Other long term (current) drug therapy: Secondary | ICD-10-CM | POA: Diagnosis not present

## 2016-12-16 DIAGNOSIS — Z981 Arthrodesis status: Secondary | ICD-10-CM | POA: Diagnosis not present

## 2016-12-16 DIAGNOSIS — S72002A Fracture of unspecified part of neck of left femur, initial encounter for closed fracture: Secondary | ICD-10-CM

## 2016-12-16 DIAGNOSIS — Y92009 Unspecified place in unspecified non-institutional (private) residence as the place of occurrence of the external cause: Secondary | ICD-10-CM | POA: Diagnosis not present

## 2016-12-16 DIAGNOSIS — Z96643 Presence of artificial hip joint, bilateral: Secondary | ICD-10-CM | POA: Diagnosis present

## 2016-12-16 LAB — BASIC METABOLIC PANEL
Anion gap: 12 (ref 5–15)
BUN: 6 mg/dL (ref 6–20)
CHLORIDE: 96 mmol/L — AB (ref 101–111)
CO2: 22 mmol/L (ref 22–32)
Calcium: 9.2 mg/dL (ref 8.9–10.3)
Creatinine, Ser: 0.61 mg/dL (ref 0.44–1.00)
Glucose, Bld: 99 mg/dL (ref 65–99)
Potassium: 3.9 mmol/L (ref 3.5–5.1)
SODIUM: 130 mmol/L — AB (ref 135–145)

## 2016-12-16 LAB — APTT: aPTT: 45 seconds — ABNORMAL HIGH (ref 24–36)

## 2016-12-16 LAB — CBC WITH DIFFERENTIAL/PLATELET
BASOS ABS: 0.1 10*3/uL (ref 0.0–0.1)
Basophils Relative: 1 %
EOS ABS: 0.2 10*3/uL (ref 0.0–0.7)
Eosinophils Relative: 1 %
HCT: 35.2 % — ABNORMAL LOW (ref 36.0–46.0)
HEMOGLOBIN: 11.5 g/dL — AB (ref 12.0–15.0)
Lymphocytes Relative: 21 %
Lymphs Abs: 3 10*3/uL (ref 0.7–4.0)
MCH: 23.3 pg — ABNORMAL LOW (ref 26.0–34.0)
MCHC: 32.7 g/dL (ref 30.0–36.0)
MCV: 71.3 fL — ABNORMAL LOW (ref 78.0–100.0)
Monocytes Absolute: 1.2 10*3/uL — ABNORMAL HIGH (ref 0.1–1.0)
Monocytes Relative: 8 %
NEUTROS PCT: 69 %
Neutro Abs: 10.1 10*3/uL — ABNORMAL HIGH (ref 1.7–7.7)
PLATELETS: 525 10*3/uL — AB (ref 150–400)
RBC: 4.94 MIL/uL (ref 3.87–5.11)
RDW: 18.2 % — ABNORMAL HIGH (ref 11.5–15.5)
WBC: 14.6 10*3/uL — ABNORMAL HIGH (ref 4.0–10.5)

## 2016-12-16 LAB — IRON AND TIBC
IRON: 28 ug/dL (ref 28–170)
SATURATION RATIOS: 7 % — AB (ref 10.4–31.8)
TIBC: 426 ug/dL (ref 250–450)
UIBC: 398 ug/dL

## 2016-12-16 LAB — TYPE AND SCREEN
ABO/RH(D): O POS
ANTIBODY SCREEN: NEGATIVE

## 2016-12-16 LAB — RETICULOCYTES
RBC.: 4.95 MIL/uL (ref 3.87–5.11)
RETIC CT PCT: 2.3 % (ref 0.4–3.1)
Retic Count, Absolute: 113.9 10*3/uL (ref 19.0–186.0)

## 2016-12-16 LAB — VITAMIN B12: Vitamin B-12: 400 pg/mL (ref 180–914)

## 2016-12-16 LAB — FERRITIN: Ferritin: 54 ng/mL (ref 11–307)

## 2016-12-16 LAB — PROTIME-INR
INR: 1.26
PROTHROMBIN TIME: 15.9 s — AB (ref 11.4–15.2)

## 2016-12-16 LAB — FOLATE: Folate: 11.3 ng/mL (ref >5.9)

## 2016-12-16 MED ORDER — ACETAMINOPHEN 650 MG RE SUPP: 650.0000 mg | Freq: Four times a day (QID) | RECTAL | Status: DC | PRN

## 2016-12-16 MED ORDER — SODIUM CHLORIDE 0.9% FLUSH
3.0000 mL | Freq: Two times a day (BID) | INTRAVENOUS | Status: DC
  Administered 2016-12-16 – 2016-12-18 (×5): 3 mL via INTRAVENOUS

## 2016-12-16 MED ORDER — ACETAMINOPHEN 500 MG PO TABS
1000.0000 mg | ORAL_TABLET | Freq: Once | ORAL | Status: AC
  Administered 2016-12-16: 1000 mg via ORAL
  Filled 2016-12-16: qty 2

## 2016-12-16 MED ORDER — OXYCODONE HCL 5 MG PO TABS
5.0000 mg | ORAL_TABLET | Freq: Four times a day (QID) | ORAL | Status: DC
  Administered 2016-12-16 – 2016-12-17 (×5): 5 mg via ORAL
  Filled 2016-12-16 (×5): qty 1

## 2016-12-16 MED ORDER — MORPHINE SULFATE (PF) 4 MG/ML IV SOLN
4.0000 mg | Freq: Once | INTRAVENOUS | Status: AC
  Administered 2016-12-16: 4 mg via INTRAVENOUS
  Filled 2016-12-16: qty 1

## 2016-12-16 MED ORDER — METOPROLOL TARTRATE 25 MG PO TABS
25.0000 mg | ORAL_TABLET | Freq: Two times a day (BID) | ORAL | Status: DC
  Administered 2016-12-16 – 2016-12-18 (×5): 25 mg via ORAL
  Filled 2016-12-16 (×5): qty 1

## 2016-12-16 MED ORDER — ONDANSETRON HCL 4 MG/2ML IJ SOLN: 4.0000 mg | Freq: Three times a day (TID) | INTRAMUSCULAR | Status: DC | PRN

## 2016-12-16 MED ORDER — RIVAROXABAN 10 MG PO TABS
10.0000 mg | ORAL_TABLET | Freq: Every day | ORAL | Status: DC
  Administered 2016-12-16 – 2016-12-18 (×3): 10 mg via ORAL
  Filled 2016-12-16 (×3): qty 1

## 2016-12-16 MED ORDER — OXYCODONE-ACETAMINOPHEN 5-325 MG PO TABS
1.0000 | ORAL_TABLET | ORAL | Status: DC | PRN
  Administered 2016-12-16: 1 via ORAL
  Filled 2016-12-16: qty 1

## 2016-12-16 MED ORDER — DICLOFENAC SODIUM 1 % TD GEL
2.0000 g | Freq: Four times a day (QID) | TRANSDERMAL | Status: DC
  Administered 2016-12-16 – 2016-12-18 (×7): 2 g via TOPICAL
  Filled 2016-12-16: qty 100

## 2016-12-16 MED ORDER — METHOCARBAMOL 500 MG PO TABS
500.0000 mg | ORAL_TABLET | Freq: Four times a day (QID) | ORAL | Status: DC
  Administered 2016-12-16 – 2016-12-18 (×11): 500 mg via ORAL
  Filled 2016-12-16 (×11): qty 1

## 2016-12-16 MED ORDER — MORPHINE SULFATE (PF) 10 MG/ML IV SOLN: 2.0000 mg | INTRAVENOUS | Status: DC | PRN

## 2016-12-16 MED ORDER — ACETAMINOPHEN 325 MG PO TABS: 650.0000 mg | ORAL_TABLET | Freq: Four times a day (QID) | ORAL | Status: DC | PRN

## 2016-12-16 MED ORDER — ONDANSETRON HCL 4 MG/2ML IJ SOLN: 4.0000 mg | Freq: Four times a day (QID) | INTRAMUSCULAR | Status: DC | PRN

## 2016-12-16 MED ORDER — FENTANYL CITRATE (PF) 100 MCG/2ML IJ SOLN
100.0000 ug | Freq: Once | INTRAMUSCULAR | Status: AC
  Administered 2016-12-16: 100 ug via INTRAVENOUS
  Filled 2016-12-16: qty 2

## 2016-12-16 MED ORDER — MORPHINE SULFATE (PF) 4 MG/ML IV SOLN
2.0000 mg | INTRAVENOUS | Status: DC | PRN
  Administered 2016-12-16 – 2016-12-18 (×9): 2 mg via INTRAVENOUS
  Filled 2016-12-16 (×9): qty 1

## 2016-12-16 MED ORDER — ONDANSETRON HCL 4 MG PO TABS: 4.0000 mg | ORAL_TABLET | Freq: Four times a day (QID) | ORAL | Status: DC | PRN

## 2016-12-16 MED ORDER — AMLODIPINE BESYLATE 5 MG PO TABS
5.0000 mg | ORAL_TABLET | Freq: Every morning | ORAL | Status: DC
  Administered 2016-12-16 – 2016-12-17 (×2): 5 mg via ORAL
  Filled 2016-12-16 (×2): qty 1

## 2016-12-16 NOTE — ED Triage Notes (Signed)
Per EMS pt coming from home, pt had a hip replacement on the 11th. Pt fell in home while walking with her walker. Pt reports falling directly onto the left hip. Pt denies hitting her head or any other injuries.

## 2016-12-16 NOTE — ED Notes (Signed)
Bed: WA12 Expected date:  Expected time:  Means of arrival:  Comments: EMS 56 yo female-fell left hip-recent hip replacement 4/15

## 2016-12-16 NOTE — ED Provider Notes (Signed)
[facility] DEPT Provider Note   CSN: [id number] Arrival date & time: 12/16/16  0204  By signing my name below, I, Julien Nordmann, attest that this documentation has been prepared under the direction and in the presence of Everlene Balls, MD.  Electronically Signed: Julien Nordmann, ED Scribe. 12/16/16. 3:14 AM.    History   Chief Complaint Chief Complaint  Patient presents with  . Fall  . Hip Pain   The history is provided by the patient and the EMS personnel. No language interpreter was used.   HPI Comments: VILDA ZOLLNER is a 56 y.o. female brought in by ambulance, who has a PMhx of atrial fibrillation, HTN, and osteoarthritis of R hip presents to the Emergency Department complaining of progressively left hip pain s/p a fall that occurred earlier this evening. Per pt, she was getting out of bed with her walker when she went farther than expected and fell directly on her walker with her left hip. Pt has a past surgical hx of total arthroplasty of her left hip on 12/05/16 by Dr. Wynelle Link. She denies hitting her head or losing consciousness.   Past Medical History:  Diagnosis Date  . Arthritis    hip  and knees   . Asthma    hx of years ago   . Atrial fibrillation ([place]) 11/2016  . Blood transfusion    hx of years ago   . Cholecystitis   . Dysrhythmia    a fib  . Hypertension     Patient Active Problem List   Diagnosis Date Noted  . OA (osteoarthritis) of hip 12/05/2016  . Atrial fibrillation with RVR ([place]) 11/27/2016  . Anxiety 11/27/2016  . Left hip pain 11/27/2016  . Angioedema 04/16/2016  . Cholecystitis, acute with cholelithiasis 12/11/2013  . Postop Acute blood loss anemia 10/25/2011  . Postop Hyponatremia 10/25/2011  . Osteoarthritis of hip 10/22/2011    Past Surgical History:  Procedure Laterality Date  . ANKLE SURGERY    . BACK SURGERY     SPINAL FUSION  . CESAREAN SECTION    . cesearan     x4  . CHOLECYSTECTOMY N/A 12/11/2013   Procedure: LAPAROSCOPIC  CHOLECYSTECTOMY WITH INTRAOPERATIVE CHOLANGIOGRAM;  Surgeon: Imogene Burn. Georgette Dover, MD;  Location: [facility];  Service: General;  Laterality: N/A;  . LEFT HEART CATH AND CORONARY ANGIOGRAPHY N/A 11/29/2016   Procedure: Left Heart Cath and Coronary Angiography;  Surgeon: [name], MD;  Location: [facility] [facility];  Service: Cardiovascular;  Laterality: N/A;  . TOTAL HIP ARTHROPLASTY  10/22/2011   Procedure: TOTAL HIP ARTHROPLASTY;  Surgeon: Gearlean Alf, MD;  Location: [facility];  Service: Orthopedics;  Laterality: Right;  . TOTAL HIP ARTHROPLASTY Left 12/05/2016   Procedure: LEFT TOTAL HIP ARTHROPLASTY ANTERIOR APPROACH;  Surgeon: Gaynelle Arabian, MD;  Location: [facility];  Service: Orthopedics;  Laterality: Left;    OB History    Gravida Para Term Preterm AB Living   4 4 4     5    SAB TAB Ectopic Multiple Live Births                   Home Medications    Prior to Admission medications   Medication Sig Start Date End Date Taking? Authorizing Provider  albuterol (PROVENTIL HFA;VENTOLIN HFA) 108 (90 Base) MCG/ACT inhaler Inhale 2 puffs into the lungs every 4 (four) hours as needed for wheezing or shortness of breath.   Yes Historical Provider, MD  amLODipine (NORVASC) 5 MG tablet Take  5 mg by mouth every morning.    Yes Historical Provider, MD  methocarbamol (ROBAXIN) 500 MG tablet Take 1 tablet (500 mg total) by mouth every 6 (six) hours as needed for muscle spasms. 12/06/16  Yes Alexzandrew L Perkins, PA-C  metoprolol tartrate (LOPRESSOR) 25 MG tablet Take 1 tablet (25 mg total) by mouth 2 (two) times daily. 11/30/16  Yes [name], MD  oxyCODONE (OXY IR/ROXICODONE) 5 MG immediate release tablet Take 1-2 tablets (5-10 mg total) by mouth every 4 (four) hours as needed for moderate pain or severe pain. 12/06/16  Yes Alexzandrew L Perkins, PA-C  rivaroxaban (XARELTO) 10 MG TABS tablet Take 1 tablet (10 mg total) by mouth daily with breakfast. Take Xarelto for two and a half more weeks following discharge  from the hospital, then discontinue Xarelto. Once the patient has completed the blood thinner regimen, then take a Baby 81 mg Aspirin daily for three more weeks. 12/06/16  Yes Alexzandrew L Perkins, PA-C  traMADol (ULTRAM) 50 MG tablet Take 1-2 tablets (50-100 mg total) by mouth every 6 (six) hours as needed for moderate pain. 12/06/16  Yes Alexzandrew L Perkins, PA-C  diltiazem (CARDIZEM) 60 MG tablet Take 0.5 tablets (30 mg total) by mouth 2 (two) times daily. Patient not taking: Reported on 12/16/2016 11/30/16   [name], MD    Family History No family history on file.  Social History Social History  Substance Use Topics  . Smoking status: Current Every Day Smoker    Packs/day: 0.50    Years: 10.00    Types: Cigarettes  . Smokeless tobacco: Never Used  . Alcohol use 3.0 oz/week    4 Cans of beer, 1 Glasses of wine per week     Comment: ocassionally     Allergies   Bee venom and Lisinopril   Review of Systems Review of Systems  All other systems reviewed and are negative for acute changes except as noted in the HPI.   Physical Exam Updated Vital Signs BP 136/86 (BP Location: Left Arm)   Pulse 90   Temp 98.2 F (36.8 C) (Oral)   Resp 18   SpO2 93%   Physical Exam  Constitutional: She is oriented to person, place, and time. She appears well-developed and well-nourished. No distress.  HENT:  Head: Normocephalic and atraumatic.  Nose: Nose normal.  Mouth/Throat: Oropharynx is clear and moist. No oropharyngeal exudate.  Eyes: Conjunctivae and EOM are normal. Pupils are equal, round, and reactive to light. No scleral icterus.  Neck: Normal range of motion. Neck supple. No JVD present. No tracheal deviation present. No thyromegaly present.  Cardiovascular: Normal rate, regular rhythm and normal heart sounds.  Exam reveals no gallop and no friction rub.   No murmur heard. Pulmonary/Chest: Effort normal and breath sounds normal. No respiratory distress. She has no  wheezes. She exhibits no tenderness.  Abdominal: Soft. Bowel sounds are normal. She exhibits no distension and no mass. There is no tenderness. There is no rebound and no guarding.  Musculoskeletal: She exhibits tenderness. She exhibits no edema.  Left hip incision clean, dry, and intact appropriate post op swelling and bruising TTP of left anterior hip  Lymphadenopathy:    She has no cervical adenopathy.  Neurological: She is alert and oriented to person, place, and time. No cranial nerve deficit. She exhibits normal muscle tone.  Skin: Skin is warm and dry. No rash noted. No erythema. No pallor.  Nursing note and vitals reviewed.    ED Treatments /  Results  DIAGNOSTIC STUDIES: Oxygen Saturation is 93% on RA, normal by my interpretation.  COORDINATION OF CARE:  2:59 AM Discussed treatment plan with pt at bedside and pt agreed to plan.  Labs (all labs ordered are listed, but only abnormal results are displayed) Labs Reviewed  CBC WITH DIFFERENTIAL/PLATELET  BASIC METABOLIC PANEL    EKG  EKG Interpretation None       Radiology Ct Hip Left Wo Contrast  Result Date: 12/16/2016 CLINICAL DATA:  Status post fall, with left hip periprosthetic fracture. Further evaluation requested. Initial encounter. EXAM: CT OF THE LEFT HIP WITHOUT CONTRAST TECHNIQUE: Multidetector CT imaging of the left hip was performed according to the standard protocol. Multiplanar CT image reconstructions were also generated. COMPARISON:  None. FINDINGS: Bones/Joint/Cartilage Two fracture fragments are noted, the anterior and posterior halves of the remaining left greater femoral trochanter. These reflect avulsion fracture of the greater femoral trochanter. These extend to the superior right-angle edge of the prosthesis, though there is no significant involvement of the femoral stem. The left hip prosthesis appears otherwise intact, without evidence of loosening. Ligaments Suboptimally assessed by CT. Muscles and  Tendons Postoperative edema is noted within the musculature about the left hip, with scattered foci of soft tissue air. The tendon structures are grossly unremarkable. Soft tissues Soft tissue injury is seen tracking along the left lateral hip and proximal left thigh. The bladder is mildly distended and grossly unremarkable. IMPRESSION: 1. Two fracture fragments, the anterior and posterior halves of the remaining left greater femoral trochanter, reflecting avulsion fracture of the greater femoral trochanter. These extend to the superior right-angle edge of the prosthesis, though there is no significant involvement of the femoral stem. No evidence of loosening. 2. Postoperative edema within the musculature about the left hip, with scattered foci of soft tissue air. 3. Soft tissue injury tracking along the left lateral hip and proximal left thigh. Electronically Signed   By: Garald Balding M.D.   On: 12/16/2016 05:14   Dg Knee Complete 4 Views Left  Result Date: 12/16/2016 CLINICAL DATA:  Atrial fibrillation, progressive hip pain after a fall. Left knee pain. EXAM: LEFT KNEE - COMPLETE 4+ VIEW COMPARISON:  12/16/2016 FINDINGS: No fracture or dislocation.  Trace suprapatellar effusion. Moderate patellofemoral narrowing with superior and inferior osteophytes. Moderate severe narrowing of the lateral joint space compartment with possible disc space calcification. Moderate narrowing of the medial compartment. Prominent lateral bony spurring. IMPRESSION: 1. No acute osseous abnormality 2. Moderate arthritis of the left knee 3. Possible chondrocalcinosis Electronically Signed   By: Donavan Foil M.D.   On: 12/16/2016 03:52   Dg Hip Unilat W Or Wo Pelvis 2-3 Views Left  Result Date: 12/16/2016 CLINICAL DATA:  Initial evaluation for acute trauma, fall. Status post hip replacement. EXAM: DG HIP (WITH OR WITHOUT PELVIS) 2-3V LEFT COMPARISON:  Prior radiograph from 08/31/2015. FINDINGS: Bilateral total hip arthroplasties  in place. There is an acute minimally distracted periprosthetic fracture extending through the left greater trochanter. No other acute fracture. Left hip prosthesis remains in normal anatomic alignment. Bony pelvis intact. Limited views of the right total hip arthroplasty demonstrate no acute abnormality. Fixation hardware noted within the lower lumbar spine. No soft tissue abnormality. IMPRESSION: Acute mildly distracted periprosthetic fracture extending through the left greater trochanter. Electronically Signed   By: Jeannine Boga M.D.   On: 12/16/2016 02:48    Procedures Procedures (including critical care time)  Medications Ordered in ED Medications  acetaminophen (TYLENOL) tablet 1,000 mg (not  administered)  morphine 4 MG/ML injection 4 mg (not administered)  fentaNYL (SUBLIMAZE) injection 100 mcg (100 mcg Intravenous Given 12/16/16 0340)     Initial Impression / Assessment and Plan / ED Course  I have reviewed the triage vital signs and the nursing notes.  Pertinent labs & imaging results that were available during my care of the patient were reviewed by me and considered in my medical decision making (see chart for details).     Patient presents to the ED for a fall. XR reveals a greater troch fracture. Ispoke with Dr. Rolena Infante who recs for CT hip to ensure the frature does not extend below the prothesis or down the shaft of the femur. This was ordered.  She was given fentanyl for pain control. Will continue to monitor.  5:24 AM CT neg for significant involvement of the fracture. I spoke with Dr. Rolena Infante again who recs to obtain PT consult and DC if patient can walk. He or someone on ortho team will also evaluate the patient in the ED prior to DC.  She was give morphine for more pain control.  Nursing team does not believe we can obtain PT consult in the ED on a Sunday.  Will attempt to ambulate with nursing staff in the ED.  6:26 AM Patient could not ambulate despite pain  medications. Plan to admit to hospitlist with ortho consult.    Final Clinical Impressions(s) / ED Diagnoses   Final diagnoses:  None   I personally performed the services described in this documentation, which was scribed in my presence. The recorded information has been reviewed and is accurate.    New Prescriptions New Prescriptions   No medications on file     Everlene Balls, MD 12/16/16 [phone]

## 2016-12-16 NOTE — Consult Note (Signed)
Reason for Consult: Left hip periprosthetic fracture Referring Physician: Dr. Delton Coombes Marissa Weber is an 56 y.o. female.  HPI: The patient is a 56 year old female with a history of atrial fibrillation, hypertension, and a recent left total hip arthroplasty by Dr. Wynelle Link. She presented to the ED with the chief complaint of left hip pain. She reports that last night around 2130 she was returning to bed after going to the restroom when she misjudged the location of the bed and fell onto her left hip. She reports immediate pain in the left hip as well as the left knee, which had previously been asymptomatic. She is unsure if she fell onto her knee or if she twisted her knee.   Past Medical History:  Diagnosis Date  . Arthritis    hip  and knees   . Asthma    hx of years ago   . Atrial fibrillation ([place]) 11/2016  . Blood transfusion    hx of years ago   . Cholecystitis   . Dysrhythmia    a fib  . Hypertension     Past Surgical History:  Procedure Laterality Date  . ANKLE SURGERY    . BACK SURGERY     SPINAL FUSION  . CESAREAN SECTION    . cesearan     x4  . CHOLECYSTECTOMY N/A 12/11/2013   Procedure: LAPAROSCOPIC CHOLECYSTECTOMY WITH INTRAOPERATIVE CHOLANGIOGRAM;  Surgeon: Imogene Burn. Georgette Dover, MD;  Location: [facility];  Service: General;  Laterality: N/A;  . LEFT HEART CATH AND CORONARY ANGIOGRAPHY N/A 11/29/2016   Procedure: Left Heart Cath and Coronary Angiography;  Surgeon: [name], MD;  Location: [facility];  Service: Cardiovascular;  Laterality: N/A;  . TOTAL HIP ARTHROPLASTY  10/22/2011   Procedure: TOTAL HIP ARTHROPLASTY;  Surgeon: Gearlean Alf, MD;  Location: [facility];  Service: Orthopedics;  Laterality: Right;  . TOTAL HIP ARTHROPLASTY Left 12/05/2016   Procedure: LEFT TOTAL HIP ARTHROPLASTY ANTERIOR APPROACH;  Surgeon: Gaynelle Arabian, MD;  [facility];  Service: Orthopedics;  Laterality: Left;    No family history on file.  Social History:  reports that she has  been smoking Cigarettes.  She has a 5.00 pack-year smoking history. She has never used smokeless tobacco. She reports that she drinks about 3.0 oz of alcohol per week . She reports that she does not use drugs.  Allergies:  Allergies  Allergen Reactions  . Bee Venom Swelling and Other (See Comments)    Swelling wherever the sting is. Also gets dizzy  . Lisinopril Other (See Comments)    swelling    Medications: I have reviewed the patient's current medications.  Results for orders placed or performed during the hospital encounter of 12/16/16 (from the past 48 hour(s))  CBC with Differential/Platelet     Status: Abnormal   Collection Time: 12/16/16  5:38 AM  Result Value Ref Range   WBC 14.6 (H) 4.0 - 10.5 K/uL   RBC 4.94 3.87 - 5.11 MIL/uL   Hemoglobin 11.5 (L) 12.0 - 15.0 g/dL   HCT 35.2 (L) 36.0 - 46.0 %   MCV 71.3 (L) 78.0 - 100.0 fL   MCH 23.3 (L) 26.0 - 34.0 pg   MCHC 32.7 30.0 - 36.0 g/dL   RDW 18.2 (H) 11.5 - 15.5 %   Platelets 525 (H) 150 - 400 K/uL   Neutrophils Relative % 69 %   Neutro Abs 10.1 (H) 1.7 - 7.7 K/uL   Lymphocytes Relative 21 %  Lymphs Abs 3.0 0.7 - 4.0 K/uL   Monocytes Relative 8 %   Monocytes Absolute 1.2 (H) 0.1 - 1.0 K/uL   Eosinophils Relative 1 %   Eosinophils Absolute 0.2 0.0 - 0.7 K/uL   Basophils Relative 1 %   Basophils Absolute 0.1 0.0 - 0.1 K/uL  Basic metabolic panel     Status: Abnormal   Collection Time: 12/16/16  5:38 AM  Result Value Ref Range   Sodium 130 (L) 135 - 145 mmol/L   Potassium 3.9 3.5 - 5.1 mmol/L   Chloride 96 (L) 101 - 111 mmol/L   CO2 22 22 - 32 mmol/L   Glucose, Bld 99 65 - 99 mg/dL   BUN 6 6 - 20 mg/dL   Creatinine, Ser 0.61 0.44 - 1.00 mg/dL   Calcium 9.2 8.9 - 10.3 mg/dL   GFR calc non Af Amer >60 >60 mL/min   GFR calc Af Amer >60 >60 mL/min    Comment: (NOTE) The eGFR has been calculated using the CKD EPI equation. This calculation has not been validated in all clinical situations. eGFR's persistently <60  mL/min signify possible Chronic Kidney Disease.    Anion gap 12 5 - 15  Protime-INR     Status: Abnormal   Collection Time: 12/16/16  6:37 AM  Result Value Ref Range   Prothrombin Time 15.9 (H) 11.4 - 15.2 seconds   INR 1.26   APTT     Status: Abnormal   Collection Time: 12/16/16  6:37 AM  Result Value Ref Range   aPTT 45 (H) 24 - 36 seconds    Comment:        IF BASELINE aPTT IS ELEVATED, SUGGEST PATIENT RISK ASSESSMENT BE USED TO DETERMINE APPROPRIATE ANTICOAGULANT THERAPY.   Type and screen [place]     Status: None   Collection Time: 12/16/16  6:58 AM  Result Value Ref Range   ABO/RH(D) O POS    Antibody Screen NEG    Sample Expiration 12/19/2016     Ct Hip Left Wo Contrast  Result Date: 12/16/2016 CLINICAL DATA:  Status post fall, with left hip periprosthetic fracture. Further evaluation requested. Initial encounter. EXAM: CT OF THE LEFT HIP WITHOUT CONTRAST TECHNIQUE: Multidetector CT imaging of the left hip was performed according to the standard protocol. Multiplanar CT image reconstructions were also generated. COMPARISON:  None. FINDINGS: Bones/Joint/Cartilage Two fracture fragments are noted, the anterior and posterior halves of the remaining left greater femoral trochanter. These reflect avulsion fracture of the greater femoral trochanter. These extend to the superior right-angle edge of the prosthesis, though there is no significant involvement of the femoral stem. The left hip prosthesis appears otherwise intact, without evidence of loosening. Ligaments Suboptimally assessed by CT. Muscles and Tendons Postoperative edema is noted within the musculature about the left hip, with scattered foci of soft tissue air. The tendon structures are grossly unremarkable. Soft tissues Soft tissue injury is seen tracking along the left lateral hip and proximal left thigh. The bladder is mildly distended and grossly unremarkable. IMPRESSION: 1. Two fracture fragments,  the anterior and posterior halves of the remaining left greater femoral trochanter, reflecting avulsion fracture of the greater femoral trochanter. These extend to the superior right-angle edge of the prosthesis, though there is no significant involvement of the femoral stem. No evidence of loosening. 2. Postoperative edema within the musculature about the left hip, with scattered foci of soft tissue air. 3. Soft tissue injury tracking along the left lateral hip  and proximal left thigh. Electronically Signed   By: Garald Balding M.D.   On: 12/16/2016 05:14   Dg Knee Complete 4 Views Left  Result Date: 12/16/2016 CLINICAL DATA:  Atrial fibrillation, progressive hip pain after a fall. Left knee pain. EXAM: LEFT KNEE - COMPLETE 4+ VIEW COMPARISON:  12/16/2016 FINDINGS: No fracture or dislocation.  Trace suprapatellar effusion. Moderate patellofemoral narrowing with superior and inferior osteophytes. Moderate severe narrowing of the lateral joint space compartment with possible disc space calcification. Moderate narrowing of the medial compartment. Prominent lateral bony spurring. IMPRESSION: 1. No acute osseous abnormality 2. Moderate arthritis of the left knee 3. Possible chondrocalcinosis Electronically Signed   By: Donavan Foil M.D.   On: 12/16/2016 03:52   Dg Hip Unilat W Or Wo Pelvis 2-3 Views Left  Result Date: 12/16/2016 CLINICAL DATA:  Initial evaluation for acute trauma, fall. Status post hip replacement. EXAM: DG HIP (WITH OR WITHOUT PELVIS) 2-3V LEFT COMPARISON:  Prior radiograph from 08/31/2015. FINDINGS: Bilateral total hip arthroplasties in place. There is an acute minimally distracted periprosthetic fracture extending through the left greater trochanter. No other acute fracture. Left hip prosthesis remains in normal anatomic alignment. Bony pelvis intact. Limited views of the right total hip arthroplasty demonstrate no acute abnormality. Fixation hardware noted within the lower lumbar spine. No  soft tissue abnormality. IMPRESSION: Acute mildly distracted periprosthetic fracture extending through the left greater trochanter. Electronically Signed   By: Jeannine Boga M.D.   On: 12/16/2016 02:48    Review of Systems  Constitutional: Positive for malaise/fatigue. Negative for chills, diaphoresis, fever and weight loss.  HENT: Negative.   Eyes: Negative.   Respiratory: Negative.   Cardiovascular: Negative.   Gastrointestinal: Negative.   Genitourinary: Negative.   Musculoskeletal: Positive for falls, joint pain and myalgias. Negative for back pain and neck pain.  Skin: Negative.   Neurological: Negative.  Negative for weakness.  Endo/Heme/Allergies: Negative.   Psychiatric/Behavioral: Negative.    Blood pressure (!) 146/82, pulse 95, temperature 98.1 F (36.7 C), temperature source Oral, resp. rate 18, SpO2 95 %. Physical Exam  Constitutional: She is oriented to person, place, and time. She appears well-developed. No distress.  HENT:  Head: Normocephalic and atraumatic.  Right Ear: External ear normal.  Left Ear: External ear normal.  Nose: Nose normal.  Mouth/Throat: Oropharynx is clear and moist.  Eyes: Conjunctivae and EOM are normal.  Neck: Normal range of motion. Neck supple.  Cardiovascular: Normal rate, regular rhythm, normal heart sounds and intact distal pulses.   No murmur heard. Respiratory: Effort normal and breath sounds normal. No respiratory distress. She has no wheezes.  GI: Soft. Bowel sounds are normal. She exhibits no distension. There is no tenderness.  Musculoskeletal:       Right hip: Normal.       Left hip: She exhibits decreased range of motion, decreased strength, tenderness and swelling.       Right knee: Normal.       Left knee: She exhibits swelling. She exhibits normal range of motion, no effusion, no deformity, no erythema, normal alignment, no LCL laxity, normal patellar mobility and no MCL laxity. Tenderness found.        Legs: Neurological: She is alert and oriented to person, place, and time. She has normal strength. No sensory deficit.  Skin: No rash noted. She is not diaphoretic. No erythema.  Psychiatric: She has a normal mood and affect. Her behavior is normal.    Assessment/Plan: Periprosthetic left hip fracture, greater  trochanter.   We will keep on her bed rest today. Will place foley catheter and continue anticoagulation. Will place SCDs. Will discuss case with Dr. Wynelle Link tomorrow and let him discuss plan with patient. She will likely need to be placed on partial weightbearing with a walker. At this time, no evidence suggesting need for surgical intervention. X-rays of the left knee show some underlying arthritic changes. No fractures. Left knee pain likely referred from hip and from irritation of arthritis from fall. Will monitor.   Zita Ozimek LAUREN 12/16/2016, 9:46 AM

## 2016-12-16 NOTE — H&P (Signed)
History and Physical    Marissa Weber GYF:[id number] DOB: Jul 23, 1961 DOA: 12/16/2016    PCP: Birdie Riddle, MD  Patient coming from: home  Chief Complaint: fall and left hip pain  HPI: Marissa Weber is a 56 y.o. female with medical history of arthritis asthma, A-fib, HTN.  She underwent a left total hip arthoplasty on 41// and was discharged home on 4/13 with HHPT. She previously underwent a cardiac eval for A-fib and a cardiac cath which was negative.  She was backing up to sit on her bed today and thought she was going to sit on the bed but fell to the floor and developed left hip and knee pain. She has otherwise been recovering normally from her hip surgery.   ED Course:  Sodium 130, Cl 96 WBC 14.6 Hb 11.5 Plt 525 Given Tylenol, Fentanyl and Morphine  Review of Systems:  All other systems reviewed and apart from HPI, are negative.  Past Medical History:  Diagnosis Date  . Arthritis    hip  and knees   . Asthma    hx of years ago   . Atrial fibrillation ([place]) 11/2016  . Blood transfusion    hx of years ago   . Cholecystitis   . Dysrhythmia    a fib  . Hypertension     Past Surgical History:  Procedure Laterality Date  . ANKLE SURGERY    . BACK SURGERY     SPINAL FUSION  . CESAREAN SECTION    . cesearan     x4  . CHOLECYSTECTOMY N/A 12/11/2013   Procedure: LAPAROSCOPIC CHOLECYSTECTOMY WITH INTRAOPERATIVE CHOLANGIOGRAM;  Surgeon: Imogene Burn. Georgette Dover, MD;  Location: [facility];  Service: General;  Laterality: N/A;  . LEFT HEART CATH AND CORONARY ANGIOGRAPHY N/A 11/29/2016   Procedure: Left Heart Cath and Coronary Angiography;  Surgeon: [name], MD;  Location: [facility];  Service: Cardiovascular;  Laterality: N/A;  . TOTAL HIP ARTHROPLASTY  10/22/2011   Procedure: TOTAL HIP ARTHROPLASTY;  Surgeon: Gearlean Alf, MD;  Location: [facility];  Service: Orthopedics;  Laterality: Right;  . TOTAL HIP ARTHROPLASTY Left 12/05/2016   Procedure: LEFT TOTAL HIP ARTHROPLASTY  ANTERIOR APPROACH;  Surgeon: Gaynelle Arabian, MD;  Location: [facility];  Service: Orthopedics;  Laterality: Left;    Social History:   reports that she has been smoking Cigarettes.  She has a 5.00 pack-year smoking history. She has never used smokeless tobacco. She reports that she drinks about 3.0 oz of alcohol per week . She reports that she does not use drugs.  Allergies  Allergen Reactions  . Bee Venom Swelling and Other (See Comments)    Swelling wherever the sting is. Also gets dizzy  . Lisinopril Other (See Comments)    swelling    No family history on file.   Prior to Admission medications   Medication Sig Start Date End Date Taking? Authorizing Provider  albuterol (PROVENTIL HFA;VENTOLIN HFA) 108 (90 Base) MCG/ACT inhaler Inhale 2 puffs into the lungs every 4 (four) hours as needed for wheezing or shortness of breath.   Yes Historical Provider, MD  amLODipine (NORVASC) 5 MG tablet Take 5 mg by mouth every morning.    Yes Historical Provider, MD  methocarbamol (ROBAXIN) 500 MG tablet Take 1 tablet (500 mg total) by mouth every 6 (six) hours as needed for muscle spasms. 12/06/16  Yes Alexzandrew L Perkins, PA-C  metoprolol tartrate (LOPRESSOR) 25 MG tablet Take 1 tablet (25 mg total) by mouth  2 (two) times daily. 11/30/16  Yes [name], MD  oxyCODONE (OXY IR/ROXICODONE) 5 MG immediate release tablet Take 1-2 tablets (5-10 mg total) by mouth every 4 (four) hours as needed for moderate pain or severe pain. 12/06/16  Yes Alexzandrew L Perkins, PA-C  rivaroxaban (XARELTO) 10 MG TABS tablet Take 1 tablet (10 mg total) by mouth daily with breakfast. Take Xarelto for two and a half more weeks following discharge from the hospital, then discontinue Xarelto. Once the patient has completed the blood thinner regimen, then take a Baby 81 mg Aspirin daily for three more weeks. 12/06/16  Yes Alexzandrew L Perkins, PA-C  traMADol (ULTRAM) 50 MG tablet Take 1-2 tablets (50-100 mg total) by mouth every 6  (six) hours as needed for moderate pain. 12/06/16  Yes Alexzandrew L Perkins, PA-C  diltiazem (CARDIZEM) 60 MG tablet Take 0.5 tablets (30 mg total) by mouth 2 (two) times daily. Patient not taking: Reported on 12/16/2016 11/30/16   [name], MD    Physical Exam: Wt Readings from Last 3 Encounters:  12/05/16 86.6 kg (191 lb)  12/04/16 86.6 kg (191 lb)  11/30/16 87.6 kg (193 lb 3.2 oz)   Vitals:   12/16/16 0619 12/16/16 0630 12/16/16 0700 12/16/16 0843  BP: 120/81 122/80 132/81 (!) 146/82  Pulse: 90 89 95 95  Resp: 18   18  Temp:    98.1 F (36.7 C)  TempSrc:    Oral  SpO2: 94% 91% 97% 95%      Constitutional: NAD, calm, comfortable Eyes: PERTLA, lids and conjunctivae normal ENMT: Mucous membranes are moist. Posterior pharynx clear of any exudate or lesions. Normal dentition.  Neck: normal, supple, no masses, no thyromegaly Respiratory: clear to auscultation bilaterally, no wheezing, no crackles. Normal respiratory effort. No accessory muscle use.  Cardiovascular: S1 & S2 heard, regular rate and rhythm, no murmurs / rubs / gallops. No extremity edema. 2+ pedal pulses. No carotid bruits.  Abdomen: No distension, no tenderness, no masses palpated. No hepatosplenomegaly. Bowel sounds normal.  Musculoskeletal: no clubbing / cyanosis. No joint deformity upper and lower extremities. Good ROM, no contractures. Normal muscle tone. Tender in left knee. Skin: no rashes, lesions, ulcers. No induration- healing incision on left hip Neurologic: CN 2-12 grossly intact. Sensation intact, DTR normal. Strength 5/5 in all 4 limbs.  Psychiatric: Normal judgment and insight. Alert and oriented x 3. Normal mood.     Labs on Admission: I have personally reviewed following labs and imaging studies  CBC:  Recent Labs Lab 12/16/16 0538  WBC 14.6*  NEUTROABS 10.1*  HGB 11.5*  HCT 35.2*  MCV 71.3*  PLT 536*   Basic Metabolic Panel:  Recent Labs Lab 12/16/16 0538  NA 130*  K 3.9  CL 96*   CO2 22  GLUCOSE 99  BUN 6  CREATININE 0.61  CALCIUM 9.2   GFR: Estimated Creatinine Clearance: 74.3 mL/min (by C-G formula based on SCr of 0.61 mg/dL). Liver Function Tests: No results for input(s): AST, ALT, ALKPHOS, BILITOT, PROT, ALBUMIN in the last 168 hours. No results for input(s): LIPASE, AMYLASE in the last 168 hours. No results for input(s): AMMONIA in the last 168 hours. Coagulation Profile:  Recent Labs Lab 12/16/16 0637  INR 1.26   Cardiac Enzymes: No results for input(s): CKTOTAL, CKMB, CKMBINDEX, TROPONINI in the last 168 hours. BNP (last 3 results) No results for input(s): PROBNP in the last 8760 hours. HbA1C: No results for input(s): HGBA1C in the last 72 hours. CBG: No  results for input(s): GLUCAP in the last 168 hours. Lipid Profile: No results for input(s): CHOL, HDL, LDLCALC, TRIG, CHOLHDL, LDLDIRECT in the last 72 hours. Thyroid Function Tests: No results for input(s): TSH, T4TOTAL, FREET4, T3FREE, THYROIDAB in the last 72 hours. Anemia Panel: No results for input(s): VITAMINB12, FOLATE, FERRITIN, TIBC, IRON, RETICCTPCT in the last 72 hours. Urine analysis:    Component Value Date/Time   COLORURINE AMBER (A) 10/15/2011 1410   APPEARANCEUR CLOUDY (A) 10/15/2011 1410   LABSPEC 1.024 10/15/2011 1410   PHURINE 7.5 10/15/2011 1410   GLUCOSEU NEGATIVE 10/15/2011 1410   HGBUR NEGATIVE 10/15/2011 1410   BILIRUBINUR SMALL (A) 10/15/2011 1410   KETONESUR NEGATIVE 10/15/2011 1410   PROTEINUR NEGATIVE 10/15/2011 1410   UROBILINOGEN 1.0 10/15/2011 1410   NITRITE NEGATIVE 10/15/2011 1410   LEUKOCYTESUR TRACE (A) 10/15/2011 1410   Sepsis Labs: @LABRCNTIP (procalcitonin:4,lacticidven:4) )No results found for this or any previous visit (from the past 240 hour(s)).   Radiological Exams on Admission: Ct Hip Left Wo Contrast  Result Date: 12/16/2016 CLINICAL DATA:  Status post fall, with left hip periprosthetic fracture. Further evaluation requested. Initial  encounter. EXAM: CT OF THE LEFT HIP WITHOUT CONTRAST TECHNIQUE: Multidetector CT imaging of the left hip was performed according to the standard protocol. Multiplanar CT image reconstructions were also generated. COMPARISON:  None. FINDINGS: Bones/Joint/Cartilage Two fracture fragments are noted, the anterior and posterior halves of the remaining left greater femoral trochanter. These reflect avulsion fracture of the greater femoral trochanter. These extend to the superior right-angle edge of the prosthesis, though there is no significant involvement of the femoral stem. The left hip prosthesis appears otherwise intact, without evidence of loosening. Ligaments Suboptimally assessed by CT. Muscles and Tendons Postoperative edema is noted within the musculature about the left hip, with scattered foci of soft tissue air. The tendon structures are grossly unremarkable. Soft tissues Soft tissue injury is seen tracking along the left lateral hip and proximal left thigh. The bladder is mildly distended and grossly unremarkable. IMPRESSION: 1. Two fracture fragments, the anterior and posterior halves of the remaining left greater femoral trochanter, reflecting avulsion fracture of the greater femoral trochanter. These extend to the superior right-angle edge of the prosthesis, though there is no significant involvement of the femoral stem. No evidence of loosening. 2. Postoperative edema within the musculature about the left hip, with scattered foci of soft tissue air. 3. Soft tissue injury tracking along the left lateral hip and proximal left thigh. Electronically Signed   By: Garald Balding M.D.   On: 12/16/2016 05:14   Dg Knee Complete 4 Views Left  Result Date: 12/16/2016 CLINICAL DATA:  Atrial fibrillation, progressive hip pain after a fall. Left knee pain. EXAM: LEFT KNEE - COMPLETE 4+ VIEW COMPARISON:  12/16/2016 FINDINGS: No fracture or dislocation.  Trace suprapatellar effusion. Moderate patellofemoral narrowing  with superior and inferior osteophytes. Moderate severe narrowing of the lateral joint space compartment with possible disc space calcification. Moderate narrowing of the medial compartment. Prominent lateral bony spurring. IMPRESSION: 1. No acute osseous abnormality 2. Moderate arthritis of the left knee 3. Possible chondrocalcinosis Electronically Signed   By: Donavan Foil M.D.   On: 12/16/2016 03:52   Dg Hip Unilat W Or Wo Pelvis 2-3 Views Left  Result Date: 12/16/2016 CLINICAL DATA:  Initial evaluation for acute trauma, fall. Status post hip replacement. EXAM: DG HIP (WITH OR WITHOUT PELVIS) 2-3V LEFT COMPARISON:  Prior radiograph from 08/31/2015. FINDINGS: Bilateral total hip arthroplasties in place. There is an  acute minimally distracted periprosthetic fracture extending through the left greater trochanter. No other acute fracture. Left hip prosthesis remains in normal anatomic alignment. Bony pelvis intact. Limited views of the right total hip arthroplasty demonstrate no acute abnormality. Fixation hardware noted within the lower lumbar spine. No soft tissue abnormality. IMPRESSION: Acute mildly distracted periprosthetic fracture extending through the left greater trochanter. Electronically Signed   By: Jeannine Boga M.D.   On: 12/16/2016 02:48    EKG: Independently reviewed. NSR with 1st degree AV block- mildly elevated ST segments in inferior leads (but had normal cath recently.  Assessment/Plan Principal Problem:   Closed left hip fracture - per ortho  Active Problems:  PAF (paroxysmal atrial fibrillation)  - CHA2DS2-VASc Score 1- seen by Dr Doylene Canard - per Ortho, cont Xarelto- she is to convert to aspirin eventually after completing DVT prophylaxis - cont Metoprolol- follow on telemetry    Hyponatremia and hypochloremia - mild, follow    Anemia in other chronic diseases classified elsewhere - check anemia panel    Leukocytosis/ thrombocytosis  - follow - may be due to  acute fracture- no signs and symptoms of infection  DVT prophylaxis: Xarelto Code Status: Full code  Family Communication:   Disposition Plan: telemetry  Consults called: orhto  Admission status: admit    Debbe Odea MD [facility] Pager: [email or web address] Password [email or web address] 7PM-7AM, please contact night-coverage   12/16/2016, 10:12 AM

## 2016-12-16 NOTE — Progress Notes (Signed)
  This is a no charge note   Pending admission per Dr. Claudine Mouton  56 year old lady with a past medical history of hypertension, asthma, atrial fibrillation on  Xarelto, who presents with left hip fracture.  Patient had  left hip replacement by Dr. Wynelle Link on 4/11, and fell, caused periprosthetic Fx of left hip. Vital signs stable. Orthopedic surgeon, Dr. Rolena Infante was consulted. Pt is accepted to tele bed as inpt.  Ivor Costa, MD  [facility] Pager [phone]  If 7PM-7AM, please contact night-coverage [email or web address] Password [facility] 12/16/2016, 6:40 AM

## 2016-12-16 NOTE — ED Notes (Signed)
Pt unable to bear weight on left leg

## 2016-12-17 DIAGNOSIS — I480 Paroxysmal atrial fibrillation: Secondary | ICD-10-CM

## 2016-12-17 DIAGNOSIS — S72002A Fracture of unspecified part of neck of left femur, initial encounter for closed fracture: Secondary | ICD-10-CM

## 2016-12-17 DIAGNOSIS — S72112D Displaced fracture of greater trochanter of left femur, subsequent encounter for closed fracture with routine healing: Secondary | ICD-10-CM

## 2016-12-17 LAB — BASIC METABOLIC PANEL
Anion gap: 10 (ref 5–15)
BUN: 6 mg/dL (ref 6–20)
CALCIUM: 9.4 mg/dL (ref 8.9–10.3)
CO2: 25 mmol/L (ref 22–32)
Chloride: 98 mmol/L — ABNORMAL LOW (ref 101–111)
Creatinine, Ser: 0.53 mg/dL (ref 0.44–1.00)
GFR calc Af Amer: >60 mL/min (ref >60)
GLUCOSE: 106 mg/dL — AB (ref 65–99)
Potassium: 4.1 mmol/L (ref 3.5–5.1)
Sodium: 133 mmol/L — ABNORMAL LOW (ref 135–145)

## 2016-12-17 LAB — CBC
HCT: 37.2 % (ref 36.0–46.0)
Hemoglobin: 12.1 g/dL (ref 12.0–15.0)
MCH: 23.1 pg — AB (ref 26.0–34.0)
MCHC: 32.5 g/dL (ref 30.0–36.0)
MCV: 71.1 fL — AB (ref 78.0–100.0)
PLATELETS: 416 10*3/uL — AB (ref 150–400)
RBC: 5.23 MIL/uL — AB (ref 3.87–5.11)
RDW: 18 % — ABNORMAL HIGH (ref 11.5–15.5)
WBC: 10.6 10*3/uL — ABNORMAL HIGH (ref 4.0–10.5)

## 2016-12-17 MED ORDER — OXYCODONE HCL 5 MG PO TABS
5.0000 mg | ORAL_TABLET | ORAL | Status: DC | PRN
  Administered 2016-12-17 – 2016-12-18 (×6): 5 mg via ORAL
  Filled 2016-12-17 (×6): qty 1

## 2016-12-17 MED ORDER — OXYCODONE-ACETAMINOPHEN 5-325 MG PO TABS
1.0000 | ORAL_TABLET | ORAL | Status: DC | PRN
  Administered 2016-12-17 – 2016-12-18 (×6): 1 via ORAL
  Filled 2016-12-17 (×6): qty 1

## 2016-12-17 MED ORDER — DIPHENHYDRAMINE HCL 25 MG PO CAPS: 25.0000 mg | ORAL_CAPSULE | Freq: Four times a day (QID) | ORAL | Status: DC | PRN

## 2016-12-17 MED ORDER — DILTIAZEM HCL ER COATED BEADS 120 MG PO CP24
120.0000 mg | ORAL_CAPSULE | Freq: Every day | ORAL | Status: DC
  Administered 2016-12-17 – 2016-12-18 (×2): 120 mg via ORAL
  Filled 2016-12-17 (×2): qty 1

## 2016-12-17 NOTE — Progress Notes (Signed)
PROGRESS NOTE    Marissa Weber  PIR:[id number] DOB: November 28, 1960 DOA: 12/16/2016 PCP: Birdie Riddle, MD    Brief Narrative: Marissa Weber is a 56 y.o. female with medical history of arthritis asthma, A-fib, HTN. She underwent a left total hip arthoplasty on 4/11 and was discharged home on 4/13 with HHPT. She previously underwent a cardiac eval for A-fib and a cardiac cath which was negative. She presented to the emergency department on 4/22 after falling on her left hip. She was backing up to sit on her bed and thought she was going to sit on the bed but fell to the floor and developed left hip and knee pain. She has otherwise been recovering normally from her hip surgery.  Assessment & Plan:   Principal Problem:   Closed left hip fracture (HCC) Active Problems:   AF (paroxysmal atrial fibrillation) (HCC)   Hyponatremia   Anemia in other chronic diseases classified elsewhere   Leukocytosis   Closed displaced fracture of greater trochanter of left femur (HCC)  Closed left hip fracture - CT left hip shows two fracture fragments, the anterior and posterior halves of the remaining left greater femoral trochanter, reflecting avulsion fracture of the greater femoral trochanter without evidence of loosening.  - XR left hip shows left hip prosthesis remains in normal anatomic alignment. Bony pelvis intact. - Patient is being followed by orthopedics and is not a candidate for surgical intervention. Plans to continue PT and will likely be placed on partial weightbearing with a walker. - Plans to continue PT as outpatient at SNF in 1-2 days - Pain well controlled with Voltaren, Robaxin, Tylenol, Morphine, Percocet prn. Advised to take pain medication prior to PT arrival to maximize tolerance to therapy  PAF (paroxysmal atrial fibrillation)  - CHA2DS2-VASc Score 1- seen by Dr Doylene Canard - per Ortho, cont Xarelto- she is to convert to aspirin eventually after completing DVT prophylaxis - cont  Metoprolol- follow on telemetry - Discontinue Norvasc and begin Diltiazem   Hyponatremia and hypochloremia - Na 133, Cl 98. Continue to monitor     Anemia in other chronic diseases classified elsewhere - Hgb trending up 11.5 >> 12.1 - Anemia panel unremarkable, B12 WNL - continue to monitor  Leukocytosis/ thrombocytosis - WBC trending down 14.6 >> 10.6, continue to monitor. Likely due to acute fracture. Patient is afebrile, not tachycardic with no signs or symptoms of infection  - Thrombocytosis likely due to trauma of fracture. Platelets trending down 525 >> 416. Continue to monitor  DVT prophylaxis: Xarelto, SCD Code Status: Full Family Communication: None at bedside Disposition Plan: SNF  Consultants:   Orthopedic   Procedures:   None  Antimicrobials:  None   Subjective: Patient still has limited mobility due to pain. She has been working with PT to increase stability and mobility. Denies fever/chills, leg swelling, difficulty breathing, chest pain.   Objective: Vitals:   12/16/16 0843 12/16/16 1700 12/16/16 2125 12/17/16 0455  BP: (!) 146/82 128/80 139/80 (!) 149/91  Pulse: 95 98 83 82  Resp: 18 18 18 18   Temp: 98.1 F (36.7 C) 98.1 F (36.7 C) 98.6 F (37 C) 98.7 F (37.1 C)  TempSrc: Oral Oral Oral Oral  SpO2: 95% 95% 95% 98%  Weight: 90.7 kg (200 lb)     Height: 4' 10.5" (1.486 m)       Intake/Output Summary (Last 24 hours) at 12/17/16 1451 Last data filed at 12/17/16 0456  Gross per 24 hour  Intake  600 ml  Output             1800 ml  Net            -1200 ml   Filed Weights   12/16/16 0843  Weight: 90.7 kg (200 lb)    Examination:  General exam: Appears calm and comfortable  Respiratory system: Clear to auscultation. Respiratory effort normal. Cardiovascular system: S1 & S2 heard, RRR. No JVD, murmurs, rubs, gallops or clicks. No pedal edema. Gastrointestinal system: Abdomen is nondistended, soft and nontender. No  organomegaly or masses felt. Normal bowel sounds heard. Central nervous system: Alert and oriented. No focal neurological deficits. Extremities: Limited range of motion and strength of left lower extremity and hip Skin: No rashes, lesions or ulcers Psychiatry: Judgement and insight appear normal. Mood & affect appropriate.   Data Reviewed: I have personally reviewed following labs and imaging studies  CBC:  Recent Labs Lab 12/16/16 0538 12/17/16 0548  WBC 14.6* 10.6*  NEUTROABS 10.1*  --   HGB 11.5* 12.1  HCT 35.2* 37.2  MCV 71.3* 71.1*  PLT 525* 595*   Basic Metabolic Panel:  Recent Labs Lab 12/16/16 0538 12/17/16 0802  NA 130* 133*  K 3.9 4.1  CL 96* 98*  CO2 22 25  GLUCOSE 99 106*  BUN 6 6  CREATININE 0.61 0.53  CALCIUM 9.2 9.4   GFR: Estimated Creatinine Clearance: 77.1 mL/min (by C-G formula based on SCr of 0.53 mg/dL). Liver Function Tests: No results for input(s): AST, ALT, ALKPHOS, BILITOT, PROT, ALBUMIN in the last 168 hours. No results for input(s): LIPASE, AMYLASE in the last 168 hours. No results for input(s): AMMONIA in the last 168 hours. Coagulation Profile:  Recent Labs Lab 12/16/16 0637  INR 1.26   Cardiac Enzymes: No results for input(s): CKTOTAL, CKMB, CKMBINDEX, TROPONINI in the last 168 hours. BNP (last 3 results) No results for input(s): PROBNP in the last 8760 hours. HbA1C: No results for input(s): HGBA1C in the last 72 hours. CBG: No results for input(s): GLUCAP in the last 168 hours. Lipid Profile: No results for input(s): CHOL, HDL, LDLCALC, TRIG, CHOLHDL, LDLDIRECT in the last 72 hours. Thyroid Function Tests: No results for input(s): TSH, T4TOTAL, FREET4, T3FREE, THYROIDAB in the last 72 hours. Anemia Panel:  Recent Labs  12/16/16 1216  VITAMINB12 400  FOLATE 11.3  FERRITIN 54  TIBC 426  IRON 28  RETICCTPCT 2.3   Sepsis Labs: No results for input(s): PROCALCITON, LATICACIDVEN in the last 168 hours.  No results  found for this or any previous visit (from the past 240 hour(s)).       Radiology Studies: Ct Hip Left Wo Contrast  Result Date: 12/16/2016 CLINICAL DATA:  Status post fall, with left hip periprosthetic fracture. Further evaluation requested. Initial encounter. EXAM: CT OF THE LEFT HIP WITHOUT CONTRAST TECHNIQUE: Multidetector CT imaging of the left hip was performed according to the standard protocol. Multiplanar CT image reconstructions were also generated. COMPARISON:  None. FINDINGS: Bones/Joint/Cartilage Two fracture fragments are noted, the anterior and posterior halves of the remaining left greater femoral trochanter. These reflect avulsion fracture of the greater femoral trochanter. These extend to the superior right-angle edge of the prosthesis, though there is no significant involvement of the femoral stem. The left hip prosthesis appears otherwise intact, without evidence of loosening. Ligaments Suboptimally assessed by CT. Muscles and Tendons Postoperative edema is noted within the musculature about the left hip, with scattered foci of soft tissue air. The tendon structures  are grossly unremarkable. Soft tissues Soft tissue injury is seen tracking along the left lateral hip and proximal left thigh. The bladder is mildly distended and grossly unremarkable. IMPRESSION: 1. Two fracture fragments, the anterior and posterior halves of the remaining left greater femoral trochanter, reflecting avulsion fracture of the greater femoral trochanter. These extend to the superior right-angle edge of the prosthesis, though there is no significant involvement of the femoral stem. No evidence of loosening. 2. Postoperative edema within the musculature about the left hip, with scattered foci of soft tissue air. 3. Soft tissue injury tracking along the left lateral hip and proximal left thigh. Electronically Signed   By: Garald Balding M.D.   On: 12/16/2016 05:14   Dg Knee Complete 4 Views Left  Result Date:  12/16/2016 CLINICAL DATA:  Atrial fibrillation, progressive hip pain after a fall. Left knee pain. EXAM: LEFT KNEE - COMPLETE 4+ VIEW COMPARISON:  12/16/2016 FINDINGS: No fracture or dislocation.  Trace suprapatellar effusion. Moderate patellofemoral narrowing with superior and inferior osteophytes. Moderate severe narrowing of the lateral joint space compartment with possible disc space calcification. Moderate narrowing of the medial compartment. Prominent lateral bony spurring. IMPRESSION: 1. No acute osseous abnormality 2. Moderate arthritis of the left knee 3. Possible chondrocalcinosis Electronically Signed   By: Donavan Foil M.D.   On: 12/16/2016 03:52   Dg Hip Unilat W Or Wo Pelvis 2-3 Views Left  Result Date: 12/16/2016 CLINICAL DATA:  Initial evaluation for acute trauma, fall. Status post hip replacement. EXAM: DG HIP (WITH OR WITHOUT PELVIS) 2-3V LEFT COMPARISON:  Prior radiograph from 08/31/2015. FINDINGS: Bilateral total hip arthroplasties in place. There is an acute minimally distracted periprosthetic fracture extending through the left greater trochanter. No other acute fracture. Left hip prosthesis remains in normal anatomic alignment. Bony pelvis intact. Limited views of the right total hip arthroplasty demonstrate no acute abnormality. Fixation hardware noted within the lower lumbar spine. No soft tissue abnormality. IMPRESSION: Acute mildly distracted periprosthetic fracture extending through the left greater trochanter. Electronically Signed   By: Jeannine Boga M.D.   On: 12/16/2016 02:48        Scheduled Meds: . amLODipine  5 mg Oral q morning - 10a  . diclofenac sodium  2 g Topical QID  . methocarbamol  500 mg Oral QID  . metoprolol tartrate  25 mg Oral BID  . rivaroxaban  10 mg Oral Q breakfast  . sodium chloride flush  3 mL Intravenous Q12H   Continuous Infusions:   LOS: 1 day    Time spent: 30 minutes  Donald Siva, PA-Student  Attending MD note  Patient  was seen, examined,treatment plan was discussed with the PA-S [facility].  I have personally reviewed the clinical findings, lab, imaging studies and management of this patient in detail. I agree with the documentation, as recorded by the PA-S and changes to above note were in bold green  Patient is 56 year old female with medical history of arthritis and A. Fib, was admitted after fall from her bed and having a new right femoral fracture. Patient is status post total hip replacement on 4/11. Orthopedic surgery consulted and no surgery recommended. Patient admitted for pain management and SNF placement.  Patient seen, examined and history obtained separate from PA student  On Exam: Gen. exam: Awake, alert, not in any distress Chest: Good air entry bilaterally, no rhonchi or rales CVS: S1-S2 regular, no murmurs Abdomen: Soft, nontender and nondistended Neurology: Non-focal Extremity: Limited range of motion on left  lower extremity and hip.  Plan  Femoral trochanter fracture Orthopedic surgery consulted and following appreciate recommendation We'll discharge to SNF in the morning   Paroxysmal A. Fib  Not on anticoagulation CHA2DSA VASc Score 1  Patient on Xarelto for DVT prophylaxis due to Hip replacement  Patient was supposed to be on Cardizem never feel at all will start and discontinue Norvasc Continue metoprolol  Leukocytosis likely related to trauma and acute fracture Monitor CBC in the morning, no signs or symptom of infection  Rest as above  Chipper Oman, MD   If 7PM-7AM, please contact night-coverage [email or web address] Password [email or web address] 12/17/2016, 2:51 PM

## 2016-12-17 NOTE — Evaluation (Addendum)
Physical Therapy Evaluation Patient Details Name: Marissa Weber MRN: [id number] DOB: May 31, 1961 Today's Date: 12/17/2016   History of Present Illness  56 yo female s/p L THA-direct anterior 12/05/16 admitted with fall, found to have L greater trochanter fx. Hx of A fib, obesity, RA, asthma  Clinical Impression  Pt admitted with above diagnosis. Pt currently with functional limitations due to the deficits listed below (see PT Problem List). Pt ambulated 13' with RW. Cues for safety awareness required. She reports her significant other provides little assistance at home. ST-SNF recommended, pt agreeable.  Pt will benefit from skilled PT to increase their independence and safety with mobility to allow discharge to the venue listed below.       Follow Up Recommendations SNF;Supervision for mobility/OOB    Equipment Recommendations  None recommended by PT    Recommendations for Other Services       Precautions / Restrictions Precautions Precautions: Fall;Other (comment) Precaution Comments: No L hip ABDuction (reviewed no hip ABDuction and educated pt to get out of bed to R side) Restrictions LLE Weight Bearing: Weight bearing as tolerated      Mobility  Bed Mobility   Bed Mobility: Supine to Sit     Supine to sit: Supervision     General bed mobility comments: increased time, used bedrail, labored 2* pain L hip  Transfers Overall transfer level: Needs assistance Equipment used: Rolling walker (2 wheeled) Transfers: Sit to/from Stand Sit to Stand: Min assist         General transfer comment: Assist to rise, stabilize, control descent. VCs safety, hand placement. VCs to back all the way up to chair prior to sitting.   Ambulation/Gait Ambulation/Gait assistance: Min guard Ambulation Distance (Feet): 80 Feet Assistive device: Rolling walker (2 wheeled) Gait Pattern/deviations: Step-to pattern;Antalgic     General Gait Details: close guard for safety, distance limited  by pain  Stairs            Wheelchair Mobility    Modified Rankin (Stroke Patients Only)       Balance Overall balance assessment: Modified Independent                                           Pertinent Vitals/Pain Pain Score: 9  Pain Location: L hip/thigh with activity and at rest Pain Descriptors / Indicators: Sore Pain Intervention(s): Limited activity within patient's tolerance;Monitored during session;Premedicated before session;Patient requesting pain meds-RN notified;Ice applied    Home Living Family/patient expects to be discharged to:: Private residence Living Arrangements: Spouse/significant other Available Help at Discharge: Available PRN/intermittently (boyfriend) Type of Home: House Home Access: Stairs to enter Entrance Stairs-Rails: None Entrance Stairs-Number of Steps: 3 Home Layout: One level Home Equipment: [place] - 4 wheels;Cane - single point;Walker - 2 wheels;Bedside commode      Prior Function Level of Independence: Independent with assistive device(s)         Comments: was using RW PTA     Hand Dominance        Extremity/Trunk Assessment   Upper Extremity Assessment Upper Extremity Assessment: Overall WFL for tasks assessed    Lower Extremity Assessment Lower Extremity Assessment: LLE deficits/detail LLE Deficits / Details: L knee ext -3/5 limited by pain, L hip flexion AAROM 60* limited by pain, pt reports tingling dorsum of B feet at baseline    Cervical / Trunk Assessment Cervical / Trunk Assessment:  Normal  Communication   Communication: No difficulties  Cognition Arousal/Alertness: Awake/alert Behavior During Therapy: WFL for tasks assessed/performed Overall Cognitive Status: Within Functional Limits for tasks assessed                                        General Comments      Exercises Total Joint Exercises Ankle Circles/Pumps: AROM;Both;10 reps;Supine Quad Sets:  AROM;Both;10 reps;Supine Gluteal Sets: AROM;Both;5 reps;Supine Short Arc Quad: AAROM;Left;5 reps;Supine Heel Slides: AAROM;Left;10 reps;Supine Hip ABduction/ADduction:  (contraindicated) Long Arc Quad: AROM;5 reps;Left;Seated   Assessment/Plan    PT Assessment Patient needs continued PT services  PT Problem List Decreased strength;Decreased range of motion;Decreased activity tolerance;Pain;Decreased mobility;Decreased safety awareness       PT Treatment Interventions DME instruction;Gait training;Therapeutic exercise;Balance training;Functional mobility training;Stair training;Therapeutic activities;Patient/family education    PT Goals (Current goals can be found in the Care Plan section)  Acute Rehab PT Goals Patient Stated Goal: less pain, likes to dance and go for walks PT Goal Formulation: With patient Time For Goal Achievement: 12/31/16 Potential to Achieve Goals: Good    Frequency Min 6X/week   Barriers to discharge        Co-evaluation               End of Session Equipment Utilized During Treatment: Gait belt Activity Tolerance: Patient limited by pain Patient left: in chair;with call bell/phone within reach Nurse Communication: Mobility status PT Visit Diagnosis: Pain;Difficulty in walking, not elsewhere classified (R26.2) Pain - Right/Left: Left Pain - part of body: Hip    Time: 2119-4174 PT Time Calculation (min) (ACUTE ONLY): 36 min   Charges:   PT Evaluation $PT Eval Low Complexity: 1 Procedure PT Treatments $Gait Training: 8-22 mins   PT G Codes:          Philomena Doheny 12/17/2016, 3:21 PM  [phone]

## 2016-12-17 NOTE — Progress Notes (Signed)
   Subjective: Hospital day - 1 Patient reports pain as mild and moderate.   Patient seen in rounds for Dr. Wynelle Link. Patient is having problems with pain in the left hip region, requiring pain medications She was admitted over the weekend for a fall at home when getting back into bed. Radiographs showed a minimally displaced left greater troch fracture around the recent left total hip replacement.  Objective: Vital signs in last 24 hours: Temp:  [98.1 F (36.7 C)-98.7 F (37.1 C)] 98.7 F (37.1 C) (04/23 0455) Pulse Rate:  [82-98] 82 (04/23 0455) Resp:  [18] 18 (04/23 0455) BP: (128-149)/(80-91) 149/91 (04/23 0455) SpO2:  [95 %-98 %] 98 % (04/23 0455) Weight:  [90.7 kg (200 lb)] 90.7 kg (200 lb) (04/22 0843)  Intake/Output from previous day:  Intake/Output Summary (Last 24 hours) at 12/17/16 0748 Last data filed at 12/17/16 0456  Gross per 24 hour  Intake              600 ml  Output             1800 ml  Net            -1200 ml    Intake/Output this shift: No intake/output data recorded.  Labs:  Recent Labs  12/16/16 0538 12/17/16 0548  HGB 11.5* 12.1    Recent Labs  12/16/16 0538 12/16/16 1216 12/17/16 0548  WBC 14.6*  --  10.6*  RBC 4.94 4.95 5.23*  HCT 35.2*  --  37.2  PLT 525*  --  416*    Recent Labs  12/16/16 0538  NA 130*  K 3.9  CL 96*  CO2 22  BUN 6  CREATININE 0.61  GLUCOSE 99  CALCIUM 9.2    Recent Labs  12/16/16 0637  INR 1.26    EXAM General - Patient is Alert, Appropriate and Oriented Extremity - Neurovascular intact Sensation intact distally Intact pulses distally Incision - no drainage Motor Function - intact, moving foot and toes well on exam. Pain noted on hip roll.  Past Medical History:  Diagnosis Date  . Arthritis    hip  and knees   . Asthma    hx of years ago   . Atrial fibrillation ([place]) 11/2016  . Blood transfusion    hx of years ago   . Cholecystitis   . Dysrhythmia    a fib  . Hypertension      Assessment/Plan: Hospital day - 1 Principal Problem:   Closed left hip fracture (HCC) Active Problems:   AF (paroxysmal atrial fibrillation) (HCC)   Hyponatremia   Anemia in other chronic diseases classified elsewhere   Leukocytosis   Closed displaced fracture of greater trochanter of left femur (HCC)  Estimated body mass index is 41.09 kg/m as calculated from the following:   Height as of this encounter: 4' 10.5" (1.486 m).   Weight as of this encounter: 90.7 kg (200 lb). Up with therapy  She may be WBAT  NO ABDUCTION of the left hip  Arlee Muslim, PA-C Orthopaedic Surgery 12/17/2016, 7:48 AM

## 2016-12-18 DIAGNOSIS — D638 Anemia in other chronic diseases classified elsewhere: Secondary | ICD-10-CM

## 2016-12-18 DIAGNOSIS — D72829 Elevated white blood cell count, unspecified: Secondary | ICD-10-CM

## 2016-12-18 LAB — BASIC METABOLIC PANEL
Anion gap: 8 (ref 5–15)
BUN: 7 mg/dL (ref 6–20)
CALCIUM: 9.3 mg/dL (ref 8.9–10.3)
CO2: 29 mmol/L (ref 22–32)
CREATININE: 0.56 mg/dL (ref 0.44–1.00)
Chloride: 96 mmol/L — ABNORMAL LOW (ref 101–111)
GFR calc Af Amer: >60 mL/min (ref >60)
GLUCOSE: 99 mg/dL (ref 65–99)
POTASSIUM: 4.2 mmol/L (ref 3.5–5.1)
SODIUM: 133 mmol/L — AB (ref 135–145)

## 2016-12-18 LAB — CBC
HEMATOCRIT: 35.2 % — AB (ref 36.0–46.0)
Hemoglobin: 11.3 g/dL — ABNORMAL LOW (ref 12.0–15.0)
MCH: 23 pg — ABNORMAL LOW (ref 26.0–34.0)
MCHC: 32.1 g/dL (ref 30.0–36.0)
MCV: 71.5 fL — ABNORMAL LOW (ref 78.0–100.0)
PLATELETS: 559 10*3/uL — AB (ref 150–400)
RBC: 4.92 MIL/uL (ref 3.87–5.11)
RDW: 18.2 % — AB (ref 11.5–15.5)
WBC: 11.5 10*3/uL — AB (ref 4.0–10.5)

## 2016-12-18 MED ORDER — DILTIAZEM HCL 60 MG PO TABS: 30.0000 mg | ORAL_TABLET | Freq: Two times a day (BID) | ORAL | 0 refills | Status: DC

## 2016-12-18 MED ORDER — OXYCODONE HCL 5 MG PO TABS: 5.0000 mg | ORAL_TABLET | Freq: Four times a day (QID) | ORAL | 0 refills | Status: DC | PRN

## 2016-12-18 NOTE — Care Management Note (Signed)
Case Management Note  Patient Details  Name: Marissa Weber MRN: [id number] Date of Birth: 12-Apr-1961  Subjective/Objective:  Notified by [place] had a 3n1 within the year-not eligible for a another 1 until 12/12/21. Patient voiced understanding.                  Action/Plan:d/c home w/HHC/ambulance transp-PTAR.   Expected Discharge Date:  12/18/16               Expected Discharge Plan:  [place]  In-House Referral:  Clinical Social Work  Discharge planning Services  CM Consult  Post Acute Care Choice:  Durable Medical Equipment (rw) Choice offered to:  Patient  DME Arranged:    DME Agency:  [place]:  RN, PT, OT, Nurse's Aide, Social Work, Refused SNF [place] Agency:  [facility]  Status of Service:  Completed, signed off  If discussed at [employer], dates discussed:    Additional Comments:  Dessa Phi, RN 12/18/2016, 1:29 PM

## 2016-12-18 NOTE — Progress Notes (Signed)
Physical Therapy Treatment Patient Details Name: Marissa Weber MRN: [id number] DOB: 30-Jul-1961 Today's Date: 12/18/2016    History of Present Illness 56 yo female s/p L THA-direct anterior 12/05/16 admitted with fall, found to have L greater trochanter fx. Hx of A fib, obesity, RA, asthma    PT Comments    Progressing slowly with mobility. Pt able to recall and demonstrate adherence to precaution. Educated pt on car transfer and avoiding L hip abduction-"keep knees together". Discussed d/c plan-pt stated she has decided to return home. Recommend HHPT and 24 hour supervision/assist.     Follow Up Recommendations  SNF (pt reported she has decided to return home instead)     Equipment Recommendations  3in1 (PT)    Recommendations for Other Services       Precautions / Restrictions Precautions Precautions: Fall Precaution Comments: No L hip ABDuction; pt educated on no L hip ABDuction  Restrictions Weight Bearing Restrictions: No LLE Weight Bearing: Weight bearing as tolerated    Mobility  Bed Mobility Overal bed mobility: Needs Assistance Bed Mobility: Supine to Sit;Sit to Supine     Supine to sit: Min guard;HOB elevated Sit to supine: Min guard;HOB elevated   General bed mobility comments: cues before and during task for adherence to "no L hip abduction" precaution. Pt relied on bedrail. Exited on R side of bed. Cued pt to keep knees together while entering on R side.  Transfers Overall transfer level: Needs assistance Equipment used: Rolling walker (2 wheeled) Transfers: Sit to/from Stand Sit to Stand: Min guard         General transfer comment: Increased time. VCs safety, technique,hand placement.   Ambulation/Gait Ambulation/Gait assistance: Min guard Ambulation Distance (Feet): 82 Feet Assistive device: Rolling walker (2 wheeled) Gait Pattern/deviations: Step-to pattern;Antalgic     General Gait Details: cues for posture and for flat foot on L instead of  staying on toes. Slow gait speed.    Stairs            Wheelchair Mobility    Modified Rankin (Stroke Patients Only)       Balance                                            Cognition Arousal/Alertness: Awake/alert Behavior During Therapy: WFL for tasks assessed/performed Overall Cognitive Status: Within Functional Limits for tasks assessed                                        Exercises      General Comments        Pertinent Vitals/Pain Pain Assessment: 0-10 Pain Score: 9  Pain Location: L hip/thigh with activity (8/10 at rest) Pain Descriptors / Indicators: Sore;Throbbing Pain Intervention(s): Limited activity within patient's tolerance;Monitored during session;Repositioned;Ice applied;Premedicated before session    Home Living                      Prior Function            PT Goals (current goals can now be found in the care plan section) Progress towards PT goals: Progressing toward goals    Frequency           PT Plan Discharge plan needs to be updated    Co-evaluation  End of Session Equipment Utilized During Treatment: Gait belt Activity Tolerance: Patient limited by pain Patient left: in bed;with call bell/phone within reach;with bed alarm set   PT Visit Diagnosis: Muscle weakness (generalized) (M62.81);Difficulty in walking, not elsewhere classified (R26.2)     Time: 0254-2706 PT Time Calculation (min) (ACUTE ONLY): 35 min  Charges:  $Gait Training: 23-37 mins                    G Codes:          Weston Anna, MPT Pager: [phone]

## 2016-12-18 NOTE — Care Management Note (Signed)
Case Management Note  Patient Details  Name: Marissa Weber MRN: [id number] Date of Birth: Oct 23, 1960  Subjective/Objective:  PT recc SNF-patient declines SNF. Provided patient w/[facility] agency list chose-[facility]-rep Tim aware of [place] orders, & d/c today home.[facility] dme rep Kim aware of home 3n1 order to deliver to rm prior d/c. Patient still deciding which home she will stay @-currently-its her home address on demographic sheet-PTAR forms in shadow chart-Nurse can call PTAR when ready for d/c.  No further CM needs.                Action/Plan:d/c home w/HHC/dme   Expected Discharge Date:                  Expected Discharge Plan:  [place]  In-House Referral:  Clinical Social Work  Discharge planning Services  CM Consult  Post Acute Care Choice:  Durable Medical Equipment (rw) Choice offered to:  Patient  DME Arranged:  3-N-1 DME Agency:  [place]:  RN, PT, OT, Nurse's Aide, Social Work, Refused SNF [place] Agency:  [facility]  Status of Service:  Completed, signed off  If discussed at [employer], dates discussed:    Additional Comments:  Dessa Phi, RN 12/18/2016, 11:49 AM

## 2016-12-18 NOTE — Discharge Summary (Signed)
Physician Discharge Summary  Marissa Weber  EGB:[id number]  DOB: [birth year]  DOA: 12/16/2016 PCP: Birdie Riddle, MD  Admit date: 12/16/2016 Discharge date: 12/18/2016  Admitted From: Home Disposition:  Home  Recommendations for Outpatient Follow-up:  1. Follow up with PCP in 1-2 weeks 2. Please obtain BMP/CBC in one week to monitor kidney function and WBC 3. Follow-up with orthopedic Dr Maureen Ralphs in 1 week.    Home Health: RN/PT Equipment/Devices: 3in1  Discharge Condition: Stable  CODE STATUS: Full  Diet recommendation: Heart Healthy   Brief/Interim Summary: 56 year old female with medical history of arthritis, asthma, A. fib, hypertension presented to the emergency department after mechanical fall. She underwent left total hip arthroplasty on 12/05/2016 and was discharged home on 12/07/2016 with home health PT. On the day of discharge patient fell from her bed which brought her to the emergency department she was found to have a right femoral fracture on the same side of the hip replacement. Orthopedic surgery was consulted and no surgical recommendations was done at this time. They recommended WBAT, pain control and physical rehabilitation. Patient was evaluated by PT which recommended SNF but patient declined as she wanted to go home and work with PT services. Pain was well controlled during hospital stay. No other complaints arise while in hospital. Patient clinically improved we will discharge home with home health services.  Subjective: Patient seen and examined, pain control better range of movement and yesterday she was working with PT this morning with no complaints. Tolerating diet well and afebrile  Discharge Diagnoses/Hospital Course:  Femoral trochanter fracture Orthopedic surgery consulted and following appreciate recommendation Patient declined SNF, would like to go home with physical therapy. 3in1 walker given.  Prescribed pain control medication for 5  days.  Paroxysmal A. Fib  Not on anticoagulation CHA2DSA VASc Score 1  Patient on Xarelto for DVT prophylaxis due to Hip replacement  Patient was supposed to be on Cardizem never filled prescription, Cardizem added on this admission and Norvasc discontinued  Continue metoprolol Follow up with cardiology   Leukocytosis likely related to trauma and acute fracture No signs or symptom of infection Repeat CBC in 1 week   Anemia of chronic disease.  Hgb stable, no signs of bleeding.  Check cbc in 1 week with PCP   All other chronic medical condition were stable during the hospitalization.  Patient was seen by physical therapy, recommending SNF, patient declines she want to go home with [facility] PT services. CM arranged services and a 3in1 walker was prescribed. On the day of the discharge the patient's vitals were stable, and no other acute medical condition were reported by patient. Patient was felt safe to be discharge to home   Discharge Instructions  You were cared for by a hospitalist during your hospital stay. If you have any questions about your discharge medications or the care you received while you were in the hospital after you are discharged, you can call the unit and asked to speak with the hospitalist on call if the hospitalist that took care of you is not available. Once you are discharged, your primary care physician will handle any further medical issues. Please note that NO REFILLS for any discharge medications will be authorized once you are discharged, as it is imperative that you return to your primary care physician (or establish a relationship with a primary care physician if you do not have one) for your aftercare needs so that they can reassess your need for medications and monitor your  lab values.  Discharge Instructions    Call MD for:  difficulty breathing, headache or visual disturbances    Complete by:  As directed    Call MD for:  extreme fatigue    Complete by:  As  directed    Call MD for:  hives    Complete by:  As directed    Call MD for:  persistant dizziness or light-headedness    Complete by:  As directed    Call MD for:  persistant nausea and vomiting    Complete by:  As directed    Call MD for:  redness, tenderness, or signs of infection (pain, swelling, redness, odor or green/yellow discharge around incision site)    Complete by:  As directed    Call MD for:  severe uncontrolled pain    Complete by:  As directed    Call MD for:  temperature >100.4    Complete by:  As directed    Diet - low sodium heart healthy    Complete by:  As directed    Increase activity slowly    Complete by:  As directed      Allergies as of 12/18/2016      Reactions   Bee Venom Swelling, Other (See Comments)   Swelling wherever the sting is. Also gets dizzy   Lisinopril Other (See Comments)   swelling      Medication List    STOP taking these medications   amLODipine 5 MG tablet Commonly known as:  NORVASC   traMADol 50 MG tablet Commonly known as:  ULTRAM     TAKE these medications   albuterol 108 (90 Base) MCG/ACT inhaler Commonly known as:  PROVENTIL HFA;VENTOLIN HFA Inhale 2 puffs into the lungs every 4 (four) hours as needed for wheezing or shortness of breath.   diltiazem 60 MG tablet Commonly known as:  CARDIZEM Take 0.5 tablets (30 mg total) by mouth 2 (two) times daily.   methocarbamol 500 MG tablet Commonly known as:  ROBAXIN Take 1 tablet (500 mg total) by mouth every 6 (six) hours as needed for muscle spasms.   metoprolol tartrate 25 MG tablet Commonly known as:  LOPRESSOR Take 1 tablet (25 mg total) by mouth 2 (two) times daily.   oxyCODONE 5 MG immediate release tablet Commonly known as:  Oxy IR/ROXICODONE Take 1-2 tablets (5-10 mg total) by mouth every 6 (six) hours as needed for moderate pain or severe pain. What changed:  when to take this   rivaroxaban 10 MG Tabs tablet Commonly known as:  XARELTO Take 1 tablet (10  mg total) by mouth daily with breakfast. Take Xarelto for two and a half more weeks following discharge from the hospital, then discontinue Xarelto. Once the patient has completed the blood thinner regimen, then take a Baby 81 mg Aspirin daily for three more weeks.            Durable Medical Equipment        Start     Ordered   12/18/16 1149  For home use only DME 3 n 1  Once     12/18/16 1148     Follow-up Information    [facility] Follow up.   Specialty:  [place] Why:  [place], physical therapy,occupational Data processing manager. Contact information: [place] [address] [phone]        [facility] Follow up.   Why:  bedside  commode. Contact information: [place] [address] [phone]        Birdie Riddle, MD. Schedule an appointment as soon as possible for a visit in 1 week(s).   Specialty:  Cardiology Why:  Hospital follow up  Contact information: [address] [place] [address] [phone]        Gearlean Alf, MD. Schedule an appointment as soon as possible for a visit in 1 week(s).   Specialty:  Orthopedic Surgery Contact information: [address] [place] [address] [phone]          Allergies  Allergen Reactions  . Bee Venom Swelling and Other (See Comments)    Swelling wherever the sting is. Also gets dizzy  . Lisinopril Other (See Comments)    swelling    Consultations:  Orthopedic surgery    Procedures/Studies: X-ray Chest Pa And Lateral  Result Date: 11/27/2016 CLINICAL DATA:  Hypertension, atrial fibrillation EXAM: CHEST  2 VIEW COMPARISON:  10/17/2016 FINDINGS: Minimal scarring or atelectasis at the lung bases. Borderline to mild cardiomegaly without overt failure. Low lung volume. No acute infiltrate or effusion. Degenerative changes of the spine. IMPRESSION: Minimal basilar atelectasis or  scar. No acute infiltrate or edema. Borderline cardiomegaly. Electronically Signed   By: Donavan Foil M.D.   On: 11/27/2016 23:34   Dg Pelvis Portable  Result Date: 12/05/2016 CLINICAL DATA:  Status post left hip joint replacement. EXAM: PORTABLE PELVIS 1-2 VIEWS COMPARISON:  Portable pelvis radiograph of October 22, 2011 FINDINGS: The patient has undergone left hip joint prosthesis placement. Radiographic positioning of the prosthetic components appears good. The interface with the native bone is normal. A surgical drain line is present. A pre-existing right hip prosthesis is unremarkable. IMPRESSION: No postprocedure complication following left hip joint prosthesis placement. Electronically Signed   By: David  Martinique M.D.   On: 12/05/2016 14:37   Ct Hip Left Wo Contrast  Result Date: 12/16/2016 CLINICAL DATA:  Status post fall, with left hip periprosthetic fracture. Further evaluation requested. Initial encounter. EXAM: CT OF THE LEFT HIP WITHOUT CONTRAST TECHNIQUE: Multidetector CT imaging of the left hip was performed according to the standard protocol. Multiplanar CT image reconstructions were also generated. COMPARISON:  None. FINDINGS: Bones/Joint/Cartilage Two fracture fragments are noted, the anterior and posterior halves of the remaining left greater femoral trochanter. These reflect avulsion fracture of the greater femoral trochanter. These extend to the superior right-angle edge of the prosthesis, though there is no significant involvement of the femoral stem. The left hip prosthesis appears otherwise intact, without evidence of loosening. Ligaments Suboptimally assessed by CT. Muscles and Tendons Postoperative edema is noted within the musculature about the left hip, with scattered foci of soft tissue air. The tendon structures are grossly unremarkable. Soft tissues Soft tissue injury is seen tracking along the left lateral hip and proximal left thigh. The bladder is mildly distended and  grossly unremarkable. IMPRESSION: 1. Two fracture fragments, the anterior and posterior halves of the remaining left greater femoral trochanter, reflecting avulsion fracture of the greater femoral trochanter. These extend to the superior right-angle edge of the prosthesis, though there is no significant involvement of the femoral stem. No evidence of loosening. 2. Postoperative edema within the musculature about the left hip, with scattered foci of soft tissue air. 3. Soft tissue injury tracking along the left lateral hip and proximal left thigh. Electronically Signed   By: Garald Balding M.D.   On: 12/16/2016 05:14   Nm Myocar Multi  W/spect W/wall Motion / Ef  Result Date: 11/29/2016 CLINICAL DATA:  Eft hypertension, palpitations, chest pain, atrial fibrillation, asthma. EXAM: MYOCARDIAL IMAGING WITH SPECT (REST AND PHARMACOLOGIC-STRESS) GATED LEFT VENTRICULAR WALL MOTION STUDY LEFT VENTRICULAR EJECTION FRACTION TECHNIQUE: Standard myocardial SPECT imaging was performed after resting intravenous injection of 10 mCi Tc-21m sestamibi. Subsequently, intravenous infusion of Lexiscan was performed under the supervision of the Cardiology staff. At peak effect of the drug, 30 mCi Tc-28m sestamibi was injected intravenously and standard myocardial SPECT imaging was performed. Quantitative gated imaging was also performed to evaluate left ventricular wall motion, and estimate left ventricular ejection fraction. COMPARISON:  None. FINDINGS: Perfusion: No decreased activity in the left ventricle on stress imaging to suggest reversible ischemia or infarction. There is mild apical thinning. Wall Motion: Normal left ventricular wall motion. No left ventricular dilation. Left Ventricular Ejection Fraction: 63 % End diastolic volume 98 ml End systolic volume 37 ml IMPRESSION: 1. No reversible ischemia or infarction. 2. Normal left ventricular wall motion. 3. Left ventricular ejection fraction 63% 4. Low-risk stress test  findings*. *2012 Appropriate Use Criteria for Coronary Revascularization Focused Update: J Am Coll Cardiol. 5329;92(4):268-341. http://content.airportbarriers.com.aspx?articleid=[email or web address] Electronically Signed   By: Lucrezia Europe M.D.   On: 11/29/2016 14:21   Dg Knee Complete 4 Views Left  Result Date: 12/16/2016 CLINICAL DATA:  Atrial fibrillation, progressive hip pain after a fall. Left knee pain. EXAM: LEFT KNEE - COMPLETE 4+ VIEW COMPARISON:  12/16/2016 FINDINGS: No fracture or dislocation.  Trace suprapatellar effusion. Moderate patellofemoral narrowing with superior and inferior osteophytes. Moderate severe narrowing of the lateral joint space compartment with possible disc space calcification. Moderate narrowing of the medial compartment. Prominent lateral bony spurring. IMPRESSION: 1. No acute osseous abnormality 2. Moderate arthritis of the left knee 3. Possible chondrocalcinosis Electronically Signed   By: Donavan Foil M.D.   On: 12/16/2016 03:52   Dg C-arm 1-60 Min-no Report  Result Date: 12/05/2016 Fluoroscopy was utilized by the requesting physician.  No radiographic interpretation.   Dg Hip Unilat W Or Wo Pelvis 2-3 Views Left  Result Date: 12/16/2016 CLINICAL DATA:  Initial evaluation for acute trauma, fall. Status post hip replacement. EXAM: DG HIP (WITH OR WITHOUT PELVIS) 2-3V LEFT COMPARISON:  Prior radiograph from 08/31/2015. FINDINGS: Bilateral total hip arthroplasties in place. There is an acute minimally distracted periprosthetic fracture extending through the left greater trochanter. No other acute fracture. Left hip prosthesis remains in normal anatomic alignment. Bony pelvis intact. Limited views of the right total hip arthroplasty demonstrate no acute abnormality. Fixation hardware noted within the lower lumbar spine. No soft tissue abnormality. IMPRESSION: Acute mildly distracted periprosthetic fracture extending through the left greater trochanter. Electronically Signed   By:  Jeannine Boga M.D.   On: 12/16/2016 02:48    Discharge Exam: Vitals:   12/17/16 2229 12/18/16 0450  BP: 120/78 122/82  Pulse: 62 66  Resp: 16 15  Temp: 98.5 F (36.9 C) 98 F (36.7 C)   Vitals:   12/17/16 1507 12/17/16 2119 12/17/16 2229 12/18/16 0450  BP: (!) 129/98 (!) 135/92 120/78 122/82  Pulse: 79 65 62 66  Resp: 20  16 15   Temp: 98.7 F (37.1 C)  98.5 F (36.9 C) 98 F (36.7 C)  TempSrc: Oral  Oral Oral  SpO2: 97%  99% 95%  Weight:      Height:        General: Pt is alert, awake, not in acute distress Cardiovascular: RRR, S1/S2 +, no rubs, no gallops Respiratory:  CTA bilaterally, no wheezing, no rhonchi Abdominal: Soft, NT, ND, bowel sounds + Extremities: Left hip difficulty with movement.    The results of significant diagnostics from this hospitalization (including imaging, microbiology, ancillary and laboratory) are listed below for reference.     Microbiology: No results found for this or any previous visit (from the past 240 hour(s)).   Labs: BNP (last 3 results) No results for input(s): BNP in the last 8760 hours. Basic Metabolic Panel:  Recent Labs Lab 12/16/16 0538 12/17/16 0802 12/18/16 0617  NA 130* 133* 133*  K 3.9 4.1 4.2  CL 96* 98* 96*  CO2 22 25 29   GLUCOSE 99 106* 99  BUN 6 6 7   CREATININE 0.61 0.53 0.56  CALCIUM 9.2 9.4 9.3   Liver Function Tests: No results for input(s): AST, ALT, ALKPHOS, BILITOT, PROT, ALBUMIN in the last 168 hours. No results for input(s): LIPASE, AMYLASE in the last 168 hours. No results for input(s): AMMONIA in the last 168 hours. CBC:  Recent Labs Lab 12/16/16 0538 12/17/16 0548 12/18/16 0617  WBC 14.6* 10.6* 11.5*  NEUTROABS 10.1*  --   --   HGB 11.5* 12.1 11.3*  HCT 35.2* 37.2 35.2*  MCV 71.3* 71.1* 71.5*  PLT 525* 416* 559*    Recent Labs  12/16/16 1216  VITAMINB12 400  FOLATE 11.3  FERRITIN 54  TIBC 426  IRON 28  RETICCTPCT 2.3   Urinalysis    Component Value Date/Time    COLORURINE AMBER (A) 10/15/2011 1410   APPEARANCEUR CLOUDY (A) 10/15/2011 1410   LABSPEC 1.024 10/15/2011 1410   PHURINE 7.5 10/15/2011 1410   GLUCOSEU NEGATIVE 10/15/2011 1410   HGBUR NEGATIVE 10/15/2011 1410   BILIRUBINUR SMALL (A) 10/15/2011 1410   KETONESUR NEGATIVE 10/15/2011 1410   PROTEINUR NEGATIVE 10/15/2011 1410   UROBILINOGEN 1.0 10/15/2011 1410   NITRITE NEGATIVE 10/15/2011 1410   LEUKOCYTESUR TRACE (A) 10/15/2011 1410   Sepsis Labs Invalid input(s): PROCALCITONIN,  WBC,  LACTICIDVEN Microbiology No results found for this or any previous visit (from the past 240 hour(s)).   Time coordinating discharge: 35 minutes  SIGNED:  Chipper Oman, MD  [facility] 12/18/2016, 12:30 PM  Pager please text page via  [email or web address] Password [email or web address]

## 2017-01-25 NOTE — Addendum Note (Signed)
Addendum  created 01/25/17 1249 by Effie Berkshire, MD   Sign clinical note

## 2017-02-07 ENCOUNTER — Ambulatory Visit
Admission: RE | Admit: 2017-02-07 | Discharge: 2017-02-07 | Disposition: A | Discharge status: Home / Self Care | Payer: Medicaid Other | Source: Ambulatory Visit | Attending: Cardiovascular Disease | Admitting: Cardiovascular Disease

## 2017-02-07 ENCOUNTER — Other Ambulatory Visit: Payer: Self-pay | Admitting: Cardiovascular Disease

## 2017-02-07 DIAGNOSIS — M545 Low back pain: Secondary | ICD-10-CM

## 2017-07-15 ENCOUNTER — Other Ambulatory Visit: Payer: Self-pay

## 2017-07-15 ENCOUNTER — Emergency Department (HOSPITAL_COMMUNITY)
Admission: EM | Admit: 2017-07-15 | Discharge: 2017-07-15 | Disposition: A | Discharge status: Home / Self Care | Payer: Medicaid Other | Attending: Emergency Medicine | Admitting: Emergency Medicine

## 2017-07-15 ENCOUNTER — Encounter (HOSPITAL_COMMUNITY): Payer: Self-pay | Admitting: Emergency Medicine

## 2017-07-15 DIAGNOSIS — Z79899 Other long term (current) drug therapy: Secondary | ICD-10-CM | POA: Diagnosis not present

## 2017-07-15 DIAGNOSIS — F17210 Nicotine dependence, cigarettes, uncomplicated: Secondary | ICD-10-CM | POA: Insufficient documentation

## 2017-07-15 DIAGNOSIS — I10 Essential (primary) hypertension: Secondary | ICD-10-CM | POA: Diagnosis not present

## 2017-07-15 DIAGNOSIS — R000 Tachycardia, unspecified: Secondary | ICD-10-CM | POA: Insufficient documentation

## 2017-07-15 DIAGNOSIS — R040 Epistaxis: Secondary | ICD-10-CM | POA: Diagnosis present

## 2017-07-15 DIAGNOSIS — R0981 Nasal congestion: Secondary | ICD-10-CM | POA: Diagnosis not present

## 2017-07-15 DIAGNOSIS — Z96641 Presence of right artificial hip joint: Secondary | ICD-10-CM | POA: Diagnosis not present

## 2017-07-15 DIAGNOSIS — J45909 Unspecified asthma, uncomplicated: Secondary | ICD-10-CM | POA: Diagnosis not present

## 2017-07-15 LAB — CBC
HCT: 38.6 % (ref 36.0–46.0)
HEMOGLOBIN: 12 g/dL (ref 12.0–15.0)
MCH: 22.4 pg — AB (ref 26.0–34.0)
MCHC: 31.1 g/dL (ref 30.0–36.0)
MCV: 72.1 fL — ABNORMAL LOW (ref 78.0–100.0)
Platelets: 321 10*3/uL (ref 150–400)
RBC: 5.35 MIL/uL — AB (ref 3.87–5.11)
RDW: 19.8 % — ABNORMAL HIGH (ref 11.5–15.5)
WBC: 7.5 10*3/uL (ref 4.0–10.5)

## 2017-07-15 MED ORDER — OXYMETAZOLINE HCL 0.05 % NA SOLN
1.0000 | Freq: Once | NASAL | Status: AC
  Administered 2017-07-15: 1 via NASAL
  Filled 2017-07-15: qty 15

## 2017-07-15 MED ORDER — LIDOCAINE HCL 4 % EX SOLN
Freq: Once | CUTANEOUS | Status: AC
Start: 2017-07-15 — End: 2017-07-15
  Administered 2017-07-15: 21:00:00 via TOPICAL
  Filled 2017-07-15: qty 50

## 2017-07-15 NOTE — ED Triage Notes (Signed)
[facility]- pt coming from home with nose bleed X4 days. She recently had her nose pierced. Pt has hx of HTN. Limited bleeding noted with EMS. 164/102, 100bpm, rr16

## 2017-07-15 NOTE — ED Notes (Signed)
ED Provider at bedside. 

## 2017-07-15 NOTE — ED Notes (Signed)
Pt departed in NAD, refused use of wheelchair.  

## 2017-07-15 NOTE — ED Provider Notes (Signed)
[facility] EMERGENCY DEPARTMENT Provider Note   CSN: [id number] Arrival date & time: 07/15/17  1640     History   Chief Complaint Chief Complaint  Patient presents with  . Epistaxis    HPI Marissa Weber is a 56 y.o. female.  The history is provided by the patient. No language interpreter was used.  Epistaxis      Marissa Weber is a 56 y.o. female who presents to the Emergency Department complaining of epistaxis.  She reports 3 days of right-sided epistaxis.  Bleeding has been intermittent in nature.  She was previously on Xarelto but has not taken it for many months.  No history of prior bleeding issues.  Symptoms are moderate and constant nature.  She denies any difficulty breathing or lightheadedness.  She does have some frontal sinus congestion.  She smokes and drinks occasional alcohol, no drug use.  Past Medical History:  Diagnosis Date  . Arthritis    hip  and knees   . Asthma    hx of years ago   . Atrial fibrillation ([place]) 11/2016  . Blood transfusion    hx of years ago   . Cholecystitis   . Dysrhythmia    a fib  . Hypertension     Patient Active Problem List   Diagnosis Date Noted  . AF (paroxysmal atrial fibrillation) ([place]) 12/16/2016  . Hyponatremia 12/16/2016  . Anemia in other chronic diseases classified elsewhere 12/16/2016  . Leukocytosis 12/16/2016  . Closed left hip fracture ([place]) 12/16/2016  . Closed displaced fracture of greater trochanter of left femur ([place])   . OA (osteoarthritis) of hip 12/05/2016  . Atrial fibrillation with RVR ([place]) 11/27/2016  . Anxiety 11/27/2016  . Left hip pain 11/27/2016  . Angioedema 04/16/2016  . Cholecystitis, acute with cholelithiasis 12/11/2013  . Postop Acute blood loss anemia 10/25/2011  . Osteoarthritis of hip 10/22/2011    Past Surgical History:  Procedure Laterality Date  . ANKLE SURGERY    . BACK SURGERY     SPINAL FUSION  . CESAREAN SECTION    . cesearan     x4  .  LAPAROSCOPIC CHOLECYSTECTOMY WITH INTRAOPERATIVE CHOLANGIOGRAM N/A 12/11/2013   Performed by Donnie Mesa, MD at [facility] OR  . Left Heart Cath and Coronary Angiography N/A 11/29/2016   Performed by [name], MD at [facility]  . LEFT TOTAL HIP ARTHROPLASTY ANTERIOR APPROACH Left 12/05/2016   Performed by Gaynelle Arabian, MD at [facility] ORS  . TOTAL HIP ARTHROPLASTY Right 10/22/2011   Performed by Gearlean Alf, MD at [facility] ORS    OB History    Gravida Para Term Preterm AB Living   4 4 4     5    SAB TAB Ectopic Multiple Live Births                   Home Medications    Prior to Admission medications   Medication Sig Start Date End Date Taking? Authorizing Provider  albuterol (PROVENTIL HFA;VENTOLIN HFA) 108 (90 Base) MCG/ACT inhaler Inhale 2 puffs into the lungs every 4 (four) hours as needed for wheezing or shortness of breath.    [provider]  diltiazem (CARDIZEM) 60 MG tablet Take 0.5 tablets (30 mg total) by mouth 2 (two) times daily. 12/18/16   Doreatha Lew, MD  methocarbamol (ROBAXIN) 500 MG tablet Take 1 tablet (500 mg total) by mouth every 6 (six) hours as needed for muscle spasms. 12/06/16  Perkins, Alexzandrew L, PA-C  metoprolol tartrate (LOPRESSOR) 25 MG tablet Take 1 tablet (25 mg total) by mouth 2 (two) times daily. 11/30/16   [name], MD  oxyCODONE (OXY IR/ROXICODONE) 5 MG immediate release tablet Take 1-2 tablets (5-10 mg total) by mouth every 6 (six) hours as needed for moderate pain or severe pain. 12/18/16   Doreatha Lew, MD  rivaroxaban (XARELTO) 10 MG TABS tablet Take 1 tablet (10 mg total) by mouth daily with breakfast. Take Xarelto for two and a half more weeks following discharge from the hospital, then discontinue Xarelto. Once the patient has completed the blood thinner regimen, then take a Baby 81 mg Aspirin daily for three more weeks. 12/06/16   Perkins, Alexzandrew L, PA-C    Family History History reviewed. No pertinent family  history.  Social History Social History   Tobacco Use  . Smoking status: Current Every Day Smoker    Packs/day: 0.50    Years: 10.00    Pack years: 5.00    Types: Cigarettes  . Smokeless tobacco: Never Used  Substance Use Topics  . Alcohol use: Yes    Alcohol/week: 3.0 oz    Types: 4 Cans of beer, 1 Glasses of wine per week    Comment: ocassionally  . Drug use: No     Allergies   Bee venom and Lisinopril   Review of Systems Review of Systems  HENT: Positive for nosebleeds.   All other systems reviewed and are negative.    Physical Exam Updated Vital Signs BP (!) 132/92   Pulse (!) 101   Temp 98.7 F (37.1 C) (Oral)   Resp 16   SpO2 95%   Physical Exam  Constitutional: She is oriented to person, place, and time. She appears well-developed and well-nourished.  HENT:  Head: Normocephalic and atraumatic.  Epistaxis from the right nare, area of bleeding on the right anterior septum.  There is small amount of blood in posterior oropharynx.  Cardiovascular: Regular rhythm.  tachycardic  Pulmonary/Chest: Effort normal. No respiratory distress.  Musculoskeletal: She exhibits no edema or tenderness.  Neurological: She is alert and oriented to person, place, and time.  Skin: Skin is warm and dry.  Psychiatric: She has a normal mood and affect. Her behavior is normal.  Nursing note and vitals reviewed.    ED Treatments / Results  Labs (all labs ordered are listed, but only abnormal results are displayed) Labs Reviewed  CBC - Abnormal; Notable for the following components:      Result Value   RBC 5.35 (*)    MCV 72.1 (*)    MCH 22.4 (*)    RDW 19.8 (*)    All other components within normal limits    EKG  EKG Interpretation None       Radiology No results found.  Procedures .Epistaxis Management Date/Time: 07/15/2017 10:29 PM Performed by: Quintella Reichert, MD Authorized by: Quintella Reichert, MD   Consent:    Consent obtained:  Verbal   Consent  given by:  Patient   Risks discussed:  Bleeding, infection, nasal injury and pain Anesthesia (see MAR for exact dosages):    Anesthesia method:  Topical application   Topical anesthetic:  Lidocaine gel Procedure details:    Treatment site:  R anterior   Treatment method:  Silver nitrate (afrin)   Treatment complexity:  Limited Post-procedure details:    Assessment:  Bleeding stopped   Patient tolerance of procedure:  Tolerated well, no immediate complications   (  including critical care time)  Medications Ordered in ED Medications  oxymetazoline (AFRIN) 0.05 % nasal spray 1 spray (1 spray Each Nare Given 07/15/17 1706)  oxymetazoline (AFRIN) 0.05 % nasal spray 1 spray (1 spray Each Nare Given 07/15/17 2112)  lidocaine (XYLOCAINE) 4 % external solution ( Topical Given 07/15/17 2112)     Initial Impression / Assessment and Plan / ED Course  I have reviewed the triage vital signs and the nursing notes.  Pertinent labs & imaging results that were available during my care of the patient were reviewed by me and considered in my medical decision making (see chart for details).     Patient here for evaluation of right-sided epistaxis that has been intermittent for the last several days.  Epistaxis was treated with Afrin and silver nitrate in the emergency department with no recurrent bleeding.  Counseled patient on home care for epistaxis with use of saline nasal sprays, Afrin for the next 3 days.  Discussed outpatient follow-up and return precautions.  Patient was tachycardic on ED presentation and this did improve.  She did not appear to be asymptomatic from the tachycardia.  Final Clinical Impressions(s) / ED Diagnoses   Final diagnoses:  Acute anterior epistaxis    ED Discharge Orders    None       Quintella Reichert, MD 07/15/17 2232

## 2018-06-19 IMAGING — DX DG CHEST 2V
2 series · 2 of 2 positions shown · non-contrast
Comparison: 10/17/2016

CLINICAL DATA: Hypertension, atrial fibrillation

EXAM:
CHEST  2 VIEW

[chest pa]
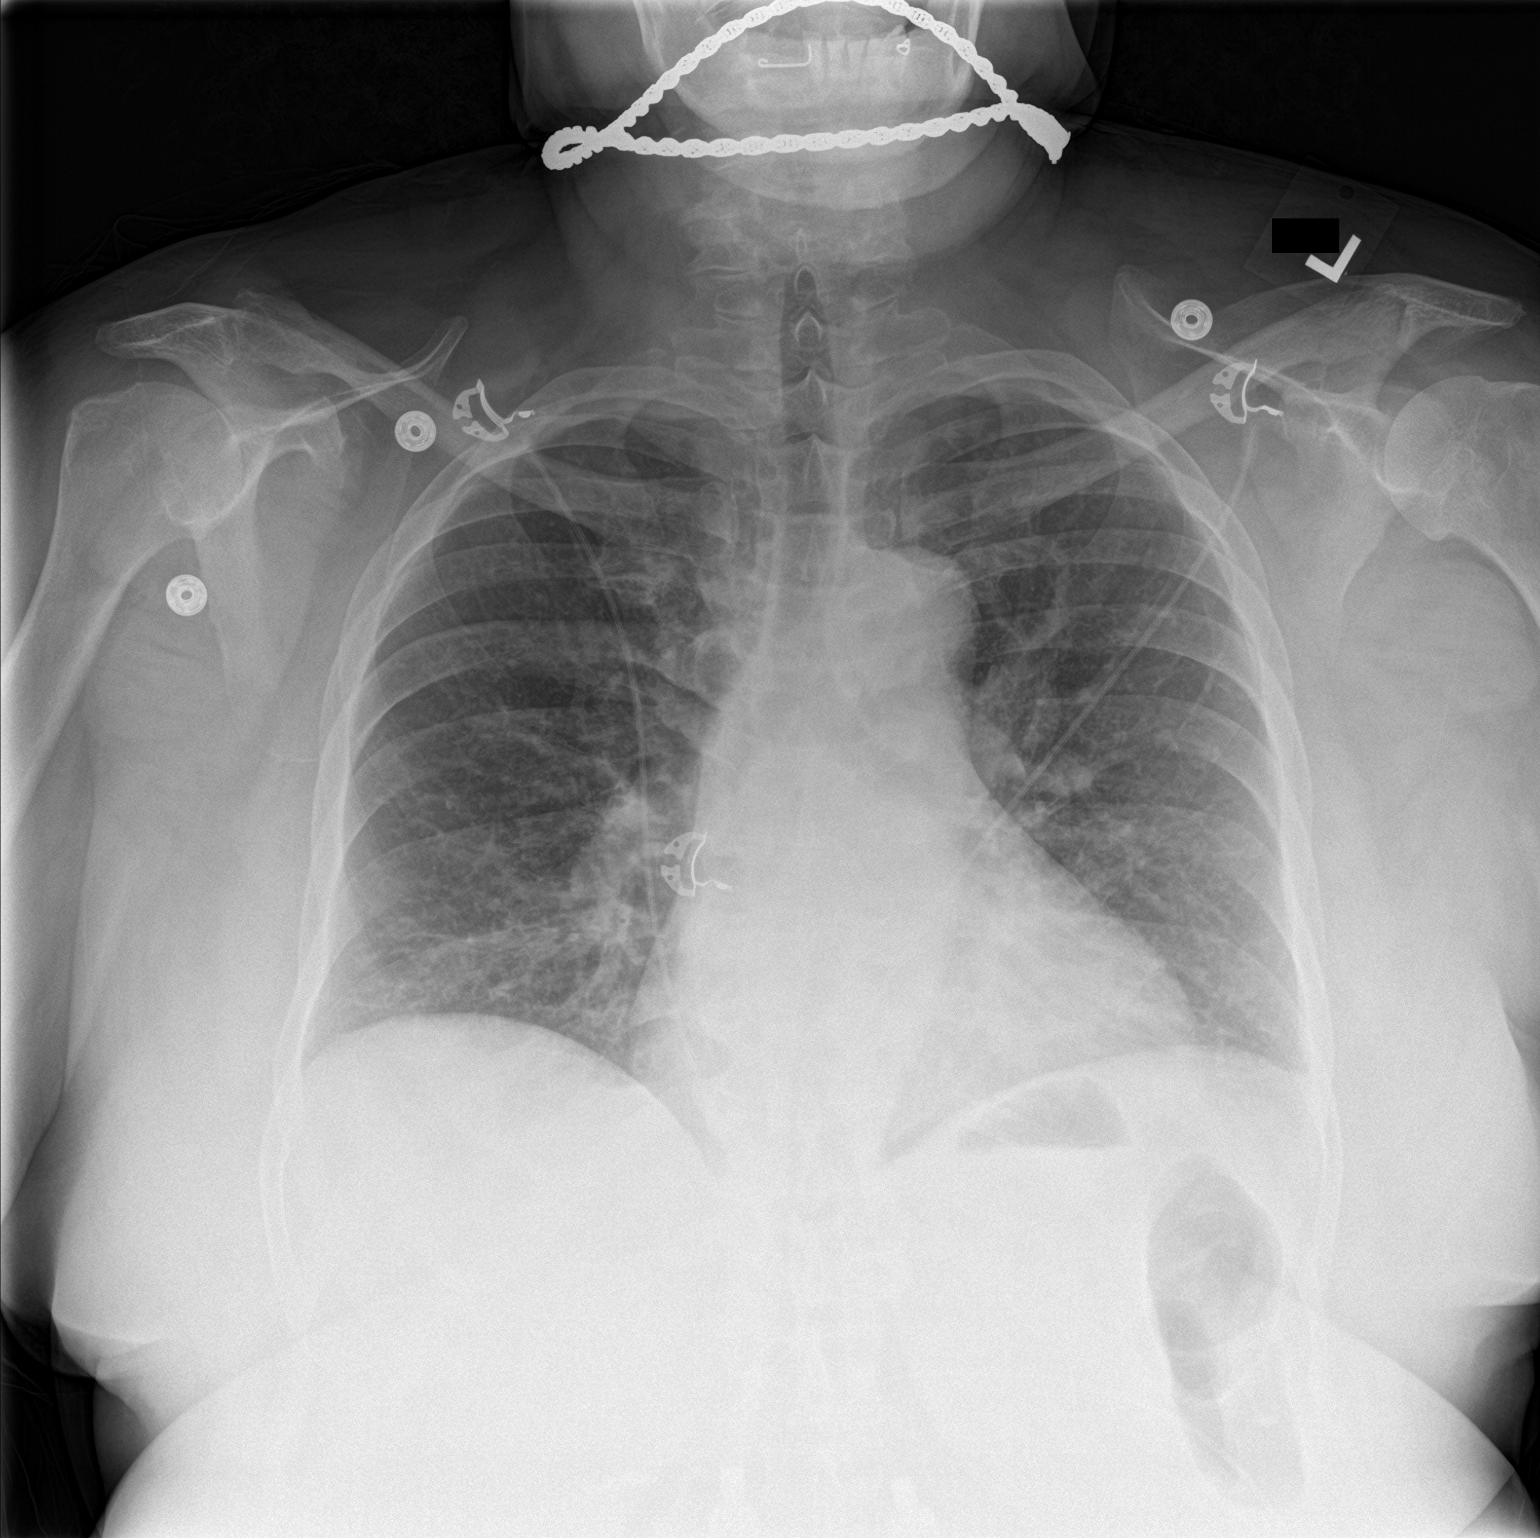

[chest lat]
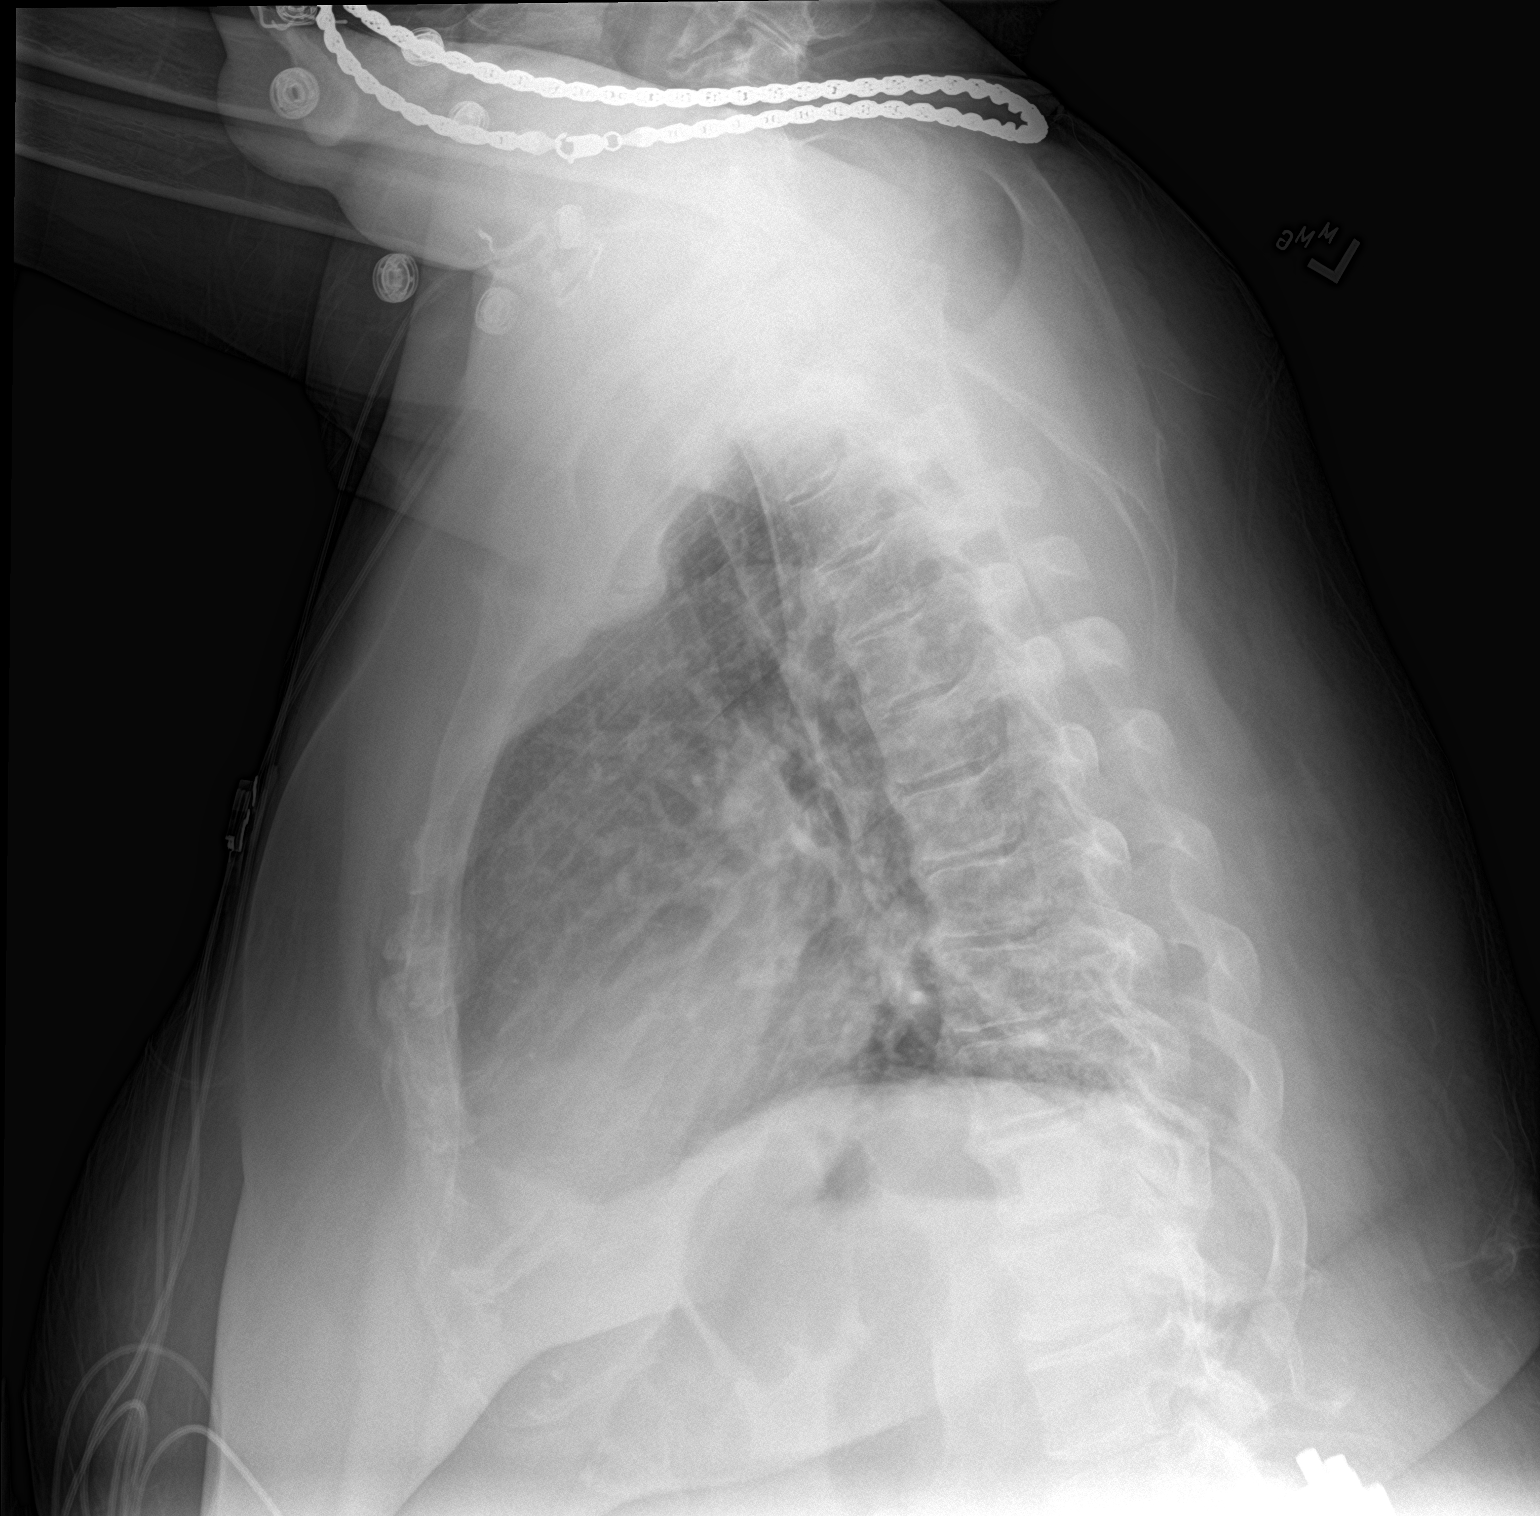

[2 of 2 positions shown; findings below may reference images not displayed]

FINDINGS: Minimal scarring or atelectasis at the lung bases. Borderline to
mild cardiomegaly without overt failure. Low lung volume. No acute
infiltrate or effusion. Degenerative changes of the spine.
IMPRESSION: Minimal basilar atelectasis or scar. No acute infiltrate or edema.
Borderline cardiomegaly.

## 2018-12-24 ENCOUNTER — Other Ambulatory Visit: Payer: Self-pay | Admitting: Cardiovascular Disease

## 2018-12-24 ENCOUNTER — Ambulatory Visit
Admission: RE | Admit: 2018-12-24 | Discharge: 2018-12-24 | Disposition: A | Discharge status: Home / Self Care | Payer: Medicaid Other | Source: Ambulatory Visit | Attending: Cardiovascular Disease | Admitting: Cardiovascular Disease

## 2018-12-24 DIAGNOSIS — R079 Chest pain, unspecified: Secondary | ICD-10-CM

## 2020-01-04 ENCOUNTER — Inpatient Hospital Stay (HOSPITAL_COMMUNITY)
Admission: EM | Admit: 2020-01-04 | Discharge: 2020-01-10 | DRG: 391 | Disposition: A | Discharge status: Home / Self Care | Payer: Medicaid Other | Attending: Internal Medicine | Admitting: Internal Medicine

## 2020-01-04 ENCOUNTER — Emergency Department (HOSPITAL_COMMUNITY): Payer: Medicaid Other

## 2020-01-04 ENCOUNTER — Other Ambulatory Visit: Payer: Self-pay

## 2020-01-04 DIAGNOSIS — Z981 Arthrodesis status: Secondary | ICD-10-CM

## 2020-01-04 DIAGNOSIS — I951 Orthostatic hypotension: Secondary | ICD-10-CM | POA: Diagnosis present

## 2020-01-04 DIAGNOSIS — R197 Diarrhea, unspecified: Secondary | ICD-10-CM

## 2020-01-04 DIAGNOSIS — E872 Acidosis, unspecified: Secondary | ICD-10-CM | POA: Diagnosis present

## 2020-01-04 DIAGNOSIS — E669 Obesity, unspecified: Secondary | ICD-10-CM | POA: Diagnosis present

## 2020-01-04 DIAGNOSIS — Z96643 Presence of artificial hip joint, bilateral: Secondary | ICD-10-CM | POA: Diagnosis present

## 2020-01-04 DIAGNOSIS — Z79899 Other long term (current) drug therapy: Secondary | ICD-10-CM

## 2020-01-04 DIAGNOSIS — Z79891 Long term (current) use of opiate analgesic: Secondary | ICD-10-CM

## 2020-01-04 DIAGNOSIS — R0682 Tachypnea, not elsewhere classified: Secondary | ICD-10-CM | POA: Diagnosis present

## 2020-01-04 DIAGNOSIS — Z9049 Acquired absence of other specified parts of digestive tract: Secondary | ICD-10-CM

## 2020-01-04 DIAGNOSIS — I10 Essential (primary) hypertension: Secondary | ICD-10-CM | POA: Diagnosis present

## 2020-01-04 DIAGNOSIS — R7989 Other specified abnormal findings of blood chemistry: Secondary | ICD-10-CM

## 2020-01-04 DIAGNOSIS — F17210 Nicotine dependence, cigarettes, uncomplicated: Secondary | ICD-10-CM | POA: Diagnosis present

## 2020-01-04 DIAGNOSIS — R55 Syncope and collapse: Secondary | ICD-10-CM | POA: Diagnosis present

## 2020-01-04 DIAGNOSIS — K760 Fatty (change of) liver, not elsewhere classified: Secondary | ICD-10-CM | POA: Diagnosis present

## 2020-01-04 DIAGNOSIS — R945 Abnormal results of liver function studies: Secondary | ICD-10-CM | POA: Diagnosis present

## 2020-01-04 DIAGNOSIS — R6510 Systemic inflammatory response syndrome (SIRS) of non-infectious origin without acute organ dysfunction: Secondary | ICD-10-CM | POA: Diagnosis present

## 2020-01-04 DIAGNOSIS — Z6835 Body mass index (BMI) 35.0-35.9, adult: Secondary | ICD-10-CM

## 2020-01-04 DIAGNOSIS — R630 Anorexia: Secondary | ICD-10-CM | POA: Diagnosis present

## 2020-01-04 DIAGNOSIS — R531 Weakness: Secondary | ICD-10-CM | POA: Diagnosis present

## 2020-01-04 DIAGNOSIS — Z7901 Long term (current) use of anticoagulants: Secondary | ICD-10-CM

## 2020-01-04 DIAGNOSIS — B955 Unspecified streptococcus as the cause of diseases classified elsewhere: Secondary | ICD-10-CM | POA: Diagnosis present

## 2020-01-04 DIAGNOSIS — W19XXXA Unspecified fall, initial encounter: Secondary | ICD-10-CM | POA: Diagnosis present

## 2020-01-04 DIAGNOSIS — M1990 Unspecified osteoarthritis, unspecified site: Secondary | ICD-10-CM | POA: Diagnosis present

## 2020-01-04 DIAGNOSIS — E876 Hypokalemia: Secondary | ICD-10-CM | POA: Diagnosis present

## 2020-01-04 DIAGNOSIS — R112 Nausea with vomiting, unspecified: Secondary | ICD-10-CM | POA: Diagnosis present

## 2020-01-04 DIAGNOSIS — E860 Dehydration: Secondary | ICD-10-CM | POA: Diagnosis present

## 2020-01-04 DIAGNOSIS — Z7982 Long term (current) use of aspirin: Secondary | ICD-10-CM

## 2020-01-04 DIAGNOSIS — J209 Acute bronchitis, unspecified: Secondary | ICD-10-CM | POA: Diagnosis present

## 2020-01-04 DIAGNOSIS — Z20822 Contact with and (suspected) exposure to covid-19: Secondary | ICD-10-CM | POA: Diagnosis present

## 2020-01-04 DIAGNOSIS — R2681 Unsteadiness on feet: Secondary | ICD-10-CM | POA: Diagnosis present

## 2020-01-04 DIAGNOSIS — Z91030 Bee allergy status: Secondary | ICD-10-CM

## 2020-01-04 DIAGNOSIS — J440 Chronic obstructive pulmonary disease with acute lower respiratory infection: Secondary | ICD-10-CM | POA: Diagnosis present

## 2020-01-04 DIAGNOSIS — G9341 Metabolic encephalopathy: Secondary | ICD-10-CM | POA: Diagnosis present

## 2020-01-04 DIAGNOSIS — R000 Tachycardia, unspecified: Secondary | ICD-10-CM | POA: Diagnosis present

## 2020-01-04 DIAGNOSIS — K439 Ventral hernia without obstruction or gangrene: Secondary | ICD-10-CM | POA: Diagnosis present

## 2020-01-04 DIAGNOSIS — K529 Noninfective gastroenteritis and colitis, unspecified: Secondary | ICD-10-CM | POA: Diagnosis present

## 2020-01-04 DIAGNOSIS — I480 Paroxysmal atrial fibrillation: Secondary | ICD-10-CM | POA: Diagnosis present

## 2020-01-04 DIAGNOSIS — R9431 Abnormal electrocardiogram [ECG] [EKG]: Secondary | ICD-10-CM

## 2020-01-04 DIAGNOSIS — J441 Chronic obstructive pulmonary disease with (acute) exacerbation: Secondary | ICD-10-CM | POA: Diagnosis present

## 2020-01-04 DIAGNOSIS — A084 Viral intestinal infection, unspecified: Principal | ICD-10-CM | POA: Diagnosis present

## 2020-01-04 DIAGNOSIS — Z888 Allergy status to other drugs, medicaments and biological substances status: Secondary | ICD-10-CM

## 2020-01-04 LAB — COMPREHENSIVE METABOLIC PANEL
ALT: 70 U/L — ABNORMAL HIGH (ref 0–44)
AST: 120 U/L — ABNORMAL HIGH (ref 15–41)
Albumin: 3.2 g/dL — ABNORMAL LOW (ref 3.5–5.0)
Alkaline Phosphatase: 147 U/L — ABNORMAL HIGH (ref 38–126)
Anion gap: 16 — ABNORMAL HIGH (ref 5–15)
BUN: <5 mg/dL — ABNORMAL LOW (ref 6–20)
CO2: 28 mmol/L (ref 22–32)
Calcium: 8.6 mg/dL — ABNORMAL LOW (ref 8.9–10.3)
Chloride: 93 mmol/L — ABNORMAL LOW (ref 98–111)
Creatinine, Ser: 0.9 mg/dL (ref 0.44–1.00)
GFR calc Af Amer: >60 mL/min (ref >60)
GFR calc non Af Amer: >60 mL/min (ref >60)
Glucose, Bld: 135 mg/dL — ABNORMAL HIGH (ref 70–99)
Potassium: 2.4 mmol/L — CL (ref 3.5–5.1)
Sodium: 137 mmol/L (ref 135–145)
Total Bilirubin: 2.5 mg/dL — ABNORMAL HIGH (ref 0.3–1.2)
Total Protein: 7.8 g/dL (ref 6.5–8.1)

## 2020-01-04 LAB — CBC WITH DIFFERENTIAL/PLATELET
Abs Immature Granulocytes: 0.02 10*3/uL (ref 0.00–0.07)
Basophils Absolute: 0 10*3/uL (ref 0.0–0.1)
Basophils Relative: 1 %
Eosinophils Absolute: 0.1 10*3/uL (ref 0.0–0.5)
Eosinophils Relative: 1 %
HCT: 48.3 % — ABNORMAL HIGH (ref 36.0–46.0)
Hemoglobin: 15.6 g/dL — ABNORMAL HIGH (ref 12.0–15.0)
Immature Granulocytes: 0 %
Lymphocytes Relative: 17 %
Lymphs Abs: 0.8 10*3/uL (ref 0.7–4.0)
MCH: 25.8 pg — ABNORMAL LOW (ref 26.0–34.0)
MCHC: 32.3 g/dL (ref 30.0–36.0)
MCV: 80 fL (ref 80.0–100.0)
Monocytes Absolute: 0.6 10*3/uL (ref 0.1–1.0)
Monocytes Relative: 14 %
Neutro Abs: 3 10*3/uL (ref 1.7–7.7)
Neutrophils Relative %: 67 %
Platelets: 97 10*3/uL — ABNORMAL LOW (ref 150–400)
RBC: 6.04 MIL/uL — ABNORMAL HIGH (ref 3.87–5.11)
RDW: 20 % — ABNORMAL HIGH (ref 11.5–15.5)
WBC: 4.5 10*3/uL (ref 4.0–10.5)
nRBC: 0.4 % — ABNORMAL HIGH (ref 0.0–0.2)

## 2020-01-04 LAB — LACTIC ACID, PLASMA: Lactic Acid, Venous: 3.7 mmol/L (ref 0.5–1.9)

## 2020-01-04 MED ORDER — SODIUM CHLORIDE 0.9 % IV BOLUS (SEPSIS)
1000.0000 mL | Freq: Once | INTRAVENOUS | Status: AC
  Administered 2020-01-05: 1000 mL via INTRAVENOUS

## 2020-01-04 MED ORDER — POTASSIUM CHLORIDE 10 MEQ/100ML IV SOLN
10.0000 meq | INTRAVENOUS | Status: AC
  Administered 2020-01-05 (×4): 10 meq via INTRAVENOUS
  Filled 2020-01-04 (×3): qty 100

## 2020-01-04 MED ORDER — POTASSIUM CHLORIDE CRYS ER 20 MEQ PO TBCR
40.0000 meq | EXTENDED_RELEASE_TABLET | Freq: Once | ORAL | Status: AC
  Administered 2020-01-05: 40 meq via ORAL
  Filled 2020-01-04: qty 2

## 2020-01-04 MED ORDER — SODIUM CHLORIDE 0.9% FLUSH
3.0000 mL | Freq: Once | INTRAVENOUS | Status: AC
  Administered 2020-01-05: 3 mL via INTRAVENOUS

## 2020-01-04 NOTE — ED Triage Notes (Signed)
Pt arrived with [facility] for syncope. Initial call was for a fall; daughter on scene when EMS arrived and reported pt had a syncopal episode. Initial radial pulses thready, rate 140-160, EKG showing ST. 487ml NS given enroute with heart rate improving to 120. EMS reported negative stroke screen; reports feeling lightheaded and dizzy. Has not taken home medications today

## 2020-01-04 NOTE — ED Provider Notes (Addendum)
TIME SEEN: 11:29 PM  CHIEF COMPLAINT: Nausea, vomiting, diarrhea  HPI: Patient is a 59 year old female with history of atrial fibrillation, hypertension who presents to the emergency department with complaints of syncopal event that occurred prior to arrival.  Episode witnessed by her grandchildren who are under the age of 71.  No known seizure-like activity or postictal state.  Patient states she has had innumerable episodes of nonbloody emesis and non bloody diarrhea.  No abdominal pain.  Had another syncopal event in the waiting room and is complaining of left rib pain from falling on that side.  She is not sure if she struck her head and is having left sided HA.  She is no longer on Xarelto.  No numbness, tingling or weakness.  No neck or back pain.  No fevers.  She states she is having some left sided chest discomfort that she describes as feeling like "pins".  No shortness of breath or cough.  She has not had her COVID-19 vaccinations.  No sick contacts, recent travel, hospitalization, recent antibiotic use.  Denies cough, shortness of breath, loss of taste or smell.  ROS: See HPI Constitutional: no fever  Eyes: no drainage  ENT: no runny nose   Cardiovascular:   chest pain described as "pins" Resp: no SOB  GI:  Vomiting and diarrhea GU: no dysuria Integumentary: no rash  Allergy: no hives  Musculoskeletal: no leg swelling  Neurological: no slurred speech ROS otherwise negative  PAST MEDICAL HISTORY/PAST SURGICAL HISTORY:  Past Medical History:  Diagnosis Date  . Arthritis    hip  and knees   . Asthma    hx of years ago   . Atrial fibrillation ([place]) 11/2016  . Blood transfusion    hx of years ago   . Cholecystitis   . Dysrhythmia    a fib  . Hypertension     MEDICATIONS:  Prior to Admission medications   Medication Sig Start Date End Date Taking? Authorizing Provider  albuterol (PROVENTIL HFA;VENTOLIN HFA) 108 (90 Base) MCG/ACT inhaler Inhale 2 puffs into the lungs every  4 (four) hours as needed for wheezing or shortness of breath.    [provider]  diltiazem (CARDIZEM) 60 MG tablet Take 0.5 tablets (30 mg total) by mouth 2 (two) times daily. 12/18/16   Doreatha Lew, MD  methocarbamol (ROBAXIN) 500 MG tablet Take 1 tablet (500 mg total) by mouth every 6 (six) hours as needed for muscle spasms. 12/06/16   Perkins, Alexzandrew L, PA-C  metoprolol tartrate (LOPRESSOR) 25 MG tablet Take 1 tablet (25 mg total) by mouth 2 (two) times daily. 11/30/16   [name], MD  oxyCODONE (OXY IR/ROXICODONE) 5 MG immediate release tablet Take 1-2 tablets (5-10 mg total) by mouth every 6 (six) hours as needed for moderate pain or severe pain. 12/18/16   Doreatha Lew, MD  rivaroxaban (XARELTO) 10 MG TABS tablet Take 1 tablet (10 mg total) by mouth daily with breakfast. Take Xarelto for two and a half more weeks following discharge from the hospital, then discontinue Xarelto. Once the patient has completed the blood thinner regimen, then take a Baby 81 mg Aspirin daily for three more weeks. 12/06/16   Perkins, Alexzandrew L, PA-C    ALLERGIES:  Allergies  Allergen Reactions  . Bee Venom Swelling and Other (See Comments)    Swelling wherever the sting is. Also gets dizzy  . Lisinopril Other (See Comments)    swelling    SOCIAL HISTORY:  Social History  Tobacco Use  . Smoking status: Current Every Day Smoker    Packs/day: 0.50    Years: 10.00    Pack years: 5.00    Types: Cigarettes  . Smokeless tobacco: Never Used  Substance Use Topics  . Alcohol use: Yes    Alcohol/week: 5.0 standard drinks    Types: 4 Cans of beer, 1 Glasses of wine per week    Comment: ocassionally    FAMILY HISTORY: No family history on file.  EXAM: BP (!) 126/91 (BP Location: Right Arm)   Pulse (!) 135   Temp 99.2 F (37.3 C) (Rectal)   Resp 18   Ht 4\' 11"  (1.499 m)   Wt 79.4 kg   SpO2 96%   BMI 35.35 kg/m  CONSTITUTIONAL: Alert and oriented and responds  appropriately to questions. Well-appearing; well-nourished HEAD: Normocephalic, appears atraumatic EYES: Conjunctivae clear, pupils appear equal, EOM appear intact ENT: normal nose; moist mucous membranes NECK: Supple, normal ROM, no midline cervical spine tenderness, step-off or deformity CARD: Regular and tachycardic; S1 and S2 appreciated; no murmurs, no clicks, no rubs, no gallops RESP: Normal chest excursion without splinting or tachypnea; breath sounds clear and equal bilaterally; no wheezes, no rhonchi, no rales, no hypoxia or respiratory distress, speaking full sentences ABD/GI: Normal bowel sounds; non-distended; soft, non-tender, no rebound, no guarding, no peritoneal signs, no hepatosplenomegaly; reducible periumbilical hernia on exam without tenderness BACK:  The back appears normal, no midline spinal tenderness or step-off or deformity EXT: Normal ROM in all joints; no deformity noted, no edema; no cyanosis SKIN: Normal color for age and race; warm; no rash on exposed skin NEURO: Moves all extremities equally, normal sensation diffusely, no facial asymmetry, normal speech PSYCH: The patient's mood and manner are appropriate.   MEDICAL DECISION MAKING: Patient here with 2 syncopal events in the setting of multiple episodes of vomiting and diarrhea.  Abdominal exam benign.  Rectal temp 99.2.  Suspect patient is dehydrated.  She is well-appearing, nontoxic.  Labs, urine pending.  Initial EKG reviewed/interpreted and showed prolonged QT interval.  Will repeat and check electrolyte levels.  Will hydrate patient.  Will hold on giving any antiemetics until repeat EKG.  She is not actively vomiting.  Complaining of left-sided headache and left-sided rib pain after syncopal event in the waiting room.  Will obtain CT of the head, x-ray of the left ribs.  Neurologically intact here.  No other sign of trauma on exam.  ED PROGRESS: Patient's labs reviewed/interpreted and show hypokalemia and  hypomagnesemia likely contributing to her prolonged QT interval.  Will give IV and oral replacement.  Lactate elevated to 3.7 but I suspect this is due to dehydration.  Her liver function tests are slightly elevated with normal lipase.  She is status post cholecystectomy and an ultrasound has been obtained, reviewed/interpreted and which shows no acute abnormality.  Her abdominal exam remains benign.  She has spiked an oral temperature of 100.1 but has not had a true fever here.  She continues to be tachycardic despite 2 L of IV fluids and a gram of Tylenol.  I feel she needs IV fluid resuscitation, electrolyte replenishment and admission to the hospital.  I do not feel that this is true sepsis at this time although she is tachycardic, intermittently tachypneic.  I feel this is more likely due to dehydration from likely viral process.  Antibiotics had not been given at this time.  CT of the head and x-ray of the chest have been reviewed/interpreted and  show no injury today.  PCP is Dr. Doylene Canard.   3:20 AM Discussed patient's case with hospitalist, Dr. Cyd Silence.  I have recommended admission and patient (and family if present) agree with this plan. Admitting physician will place admission orders.   I reviewed all nursing notes, vitals, pertinent previous records and reviewed/interpreted all EKGs, lab and urine results, imaging (as available).   Patient's lactate is improving.  Again I do not think this is sepsis but rather from dehydration.  Her abdominal exam continues to be benign.  We will continue IV hydration.  Repeat troponin is negative.  Covid swab pending.  3:55 AM  Nursing staff reports multiple episodes of diarrhea here.  Will add on c diff and GI pathogens.  Patient now having increased periumbilcal pain on exam.  Will obtain CTAP.  Updated hospitalist.  5:20 AM  CTAP reviewed/interpreted with no acute abnormality.  Hospitalist updated.    EKG Interpretation  Date/Time:  Monday Jan 04 2020 22:05:54 EDT Ventricular Rate:  130 PR Interval:  138 QRS Duration: 90 QT Interval:  408 QTC Calculation: 600 R Axis:   60 Text Interpretation:    Critical Test Result: Long QTc Sinus tachycardia Possible Left atrial enlargement Borderline ECG Confirmed by Pryor Curia [phone]) on 01/04/2020 11:18:04 PM       EKG Interpretation  Date/Time:  Monday Jan 04 2020 23:32:40 EDT Ventricular Rate:  119 PR Interval:  138 QRS Duration: 99 QT Interval:  349 QTC Calculation: 491 R Axis:   104 Text Interpretation: Sinus tachycardia Probable left atrial enlargement Abnormal R-wave progression, late transition Inferior infarct, age indeterminate QT interval has improved compared to prior Confirmed by Pryor Curia [phone]) on 01/04/2020 11:44:33 PM        CRITICAL CARE Performed by: Cyril Mourning Shaaron Golliday   Total critical care time: 45 minutes  Critical care time was exclusive of separately billable procedures and treating other patients.  Critical care was necessary to treat or prevent imminent or life-threatening deterioration.  Critical care was time spent personally by me on the following activities: development of treatment plan with patient and/or surrogate as well as nursing, discussions with consultants, evaluation of patient's response to treatment, examination of patient, obtaining history from patient or surrogate, ordering and performing treatments and interventions, ordering and review of laboratory studies, ordering and review of radiographic studies, pulse oximetry and re-evaluation of patient's condition.   KATSUKO PRY was evaluated in Emergency Department on 01/04/2020 for the symptoms described in the history of present illness. She was evaluated in the context of the global COVID-19 pandemic, which necessitated consideration that the patient might be at risk for infection with the SARS-CoV-2 virus that causes COVID-19. Institutional protocols and algorithms that pertain to the  evaluation of patients at risk for COVID-19 are in a state of rapid change based on information released by regulatory bodies including the CDC and federal and state organizations. These policies and algorithms were followed during the patient's care in the ED.        Sharod Petsch, Delice Bison, DO 01/05/20 [phone]

## 2020-01-04 NOTE — ED Triage Notes (Signed)
Per patient, she was getting up to go to the bathroom, felt dizzy and fell onto her L side. Reports pain to L arm and L side of abdomen. Pt has been having frequent episodes of loose BM.

## 2020-01-04 NOTE — ED Notes (Signed)
Patient transported to X-ray 

## 2020-01-05 ENCOUNTER — Emergency Department (HOSPITAL_COMMUNITY): Payer: Medicaid Other

## 2020-01-05 DIAGNOSIS — R197 Diarrhea, unspecified: Secondary | ICD-10-CM

## 2020-01-05 DIAGNOSIS — Z981 Arthrodesis status: Secondary | ICD-10-CM | POA: Diagnosis not present

## 2020-01-05 DIAGNOSIS — G9341 Metabolic encephalopathy: Secondary | ICD-10-CM | POA: Diagnosis not present

## 2020-01-05 DIAGNOSIS — Z20822 Contact with and (suspected) exposure to covid-19: Secondary | ICD-10-CM | POA: Diagnosis not present

## 2020-01-05 DIAGNOSIS — E872 Acidosis, unspecified: Secondary | ICD-10-CM | POA: Diagnosis present

## 2020-01-05 DIAGNOSIS — B955 Unspecified streptococcus as the cause of diseases classified elsewhere: Secondary | ICD-10-CM | POA: Diagnosis not present

## 2020-01-05 DIAGNOSIS — I480 Paroxysmal atrial fibrillation: Secondary | ICD-10-CM

## 2020-01-05 DIAGNOSIS — R945 Abnormal results of liver function studies: Secondary | ICD-10-CM | POA: Diagnosis not present

## 2020-01-05 DIAGNOSIS — I951 Orthostatic hypotension: Secondary | ICD-10-CM | POA: Diagnosis present

## 2020-01-05 DIAGNOSIS — K529 Noninfective gastroenteritis and colitis, unspecified: Secondary | ICD-10-CM | POA: Diagnosis not present

## 2020-01-05 DIAGNOSIS — I10 Essential (primary) hypertension: Secondary | ICD-10-CM | POA: Diagnosis not present

## 2020-01-05 DIAGNOSIS — E876 Hypokalemia: Secondary | ICD-10-CM | POA: Diagnosis present

## 2020-01-05 DIAGNOSIS — R6510 Systemic inflammatory response syndrome (SIRS) of non-infectious origin without acute organ dysfunction: Secondary | ICD-10-CM | POA: Diagnosis present

## 2020-01-05 DIAGNOSIS — Z9049 Acquired absence of other specified parts of digestive tract: Secondary | ICD-10-CM | POA: Diagnosis not present

## 2020-01-05 DIAGNOSIS — R630 Anorexia: Secondary | ICD-10-CM | POA: Diagnosis present

## 2020-01-05 DIAGNOSIS — Z888 Allergy status to other drugs, medicaments and biological substances status: Secondary | ICD-10-CM | POA: Diagnosis not present

## 2020-01-05 DIAGNOSIS — E860 Dehydration: Secondary | ICD-10-CM | POA: Diagnosis not present

## 2020-01-05 DIAGNOSIS — F17210 Nicotine dependence, cigarettes, uncomplicated: Secondary | ICD-10-CM | POA: Diagnosis present

## 2020-01-05 DIAGNOSIS — J440 Chronic obstructive pulmonary disease with acute lower respiratory infection: Secondary | ICD-10-CM | POA: Diagnosis not present

## 2020-01-05 DIAGNOSIS — A084 Viral intestinal infection, unspecified: Secondary | ICD-10-CM | POA: Diagnosis not present

## 2020-01-05 DIAGNOSIS — W19XXXA Unspecified fall, initial encounter: Secondary | ICD-10-CM | POA: Diagnosis present

## 2020-01-05 DIAGNOSIS — Z91030 Bee allergy status: Secondary | ICD-10-CM | POA: Diagnosis not present

## 2020-01-05 DIAGNOSIS — M1990 Unspecified osteoarthritis, unspecified site: Secondary | ICD-10-CM | POA: Diagnosis present

## 2020-01-05 DIAGNOSIS — J209 Acute bronchitis, unspecified: Secondary | ICD-10-CM | POA: Diagnosis not present

## 2020-01-05 DIAGNOSIS — R531 Weakness: Secondary | ICD-10-CM | POA: Diagnosis present

## 2020-01-05 DIAGNOSIS — R7989 Other specified abnormal findings of blood chemistry: Secondary | ICD-10-CM

## 2020-01-05 DIAGNOSIS — Z96643 Presence of artificial hip joint, bilateral: Secondary | ICD-10-CM | POA: Diagnosis present

## 2020-01-05 DIAGNOSIS — Z79899 Other long term (current) drug therapy: Secondary | ICD-10-CM | POA: Diagnosis not present

## 2020-01-05 DIAGNOSIS — J441 Chronic obstructive pulmonary disease with (acute) exacerbation: Secondary | ICD-10-CM | POA: Diagnosis not present

## 2020-01-05 DIAGNOSIS — R112 Nausea with vomiting, unspecified: Secondary | ICD-10-CM | POA: Diagnosis not present

## 2020-01-05 LAB — APTT: aPTT: 33 seconds (ref 24–36)

## 2020-01-05 LAB — BLOOD CULTURE ID PANEL (REFLEXED)

## 2020-01-05 LAB — CBC WITH DIFFERENTIAL/PLATELET
Abs Immature Granulocytes: 0.04 10*3/uL (ref 0.00–0.07)
Basophils Absolute: 0.1 10*3/uL (ref 0.0–0.1)
Basophils Relative: 1 %
Eosinophils Absolute: 0.1 10*3/uL (ref 0.0–0.5)
Eosinophils Relative: 1 %
HCT: 42.9 % (ref 36.0–46.0)
Hemoglobin: 13.6 g/dL (ref 12.0–15.0)
Immature Granulocytes: 1 %
Lymphocytes Relative: 23 %
Lymphs Abs: 1.6 10*3/uL (ref 0.7–4.0)
MCH: 25.8 pg — ABNORMAL LOW (ref 26.0–34.0)
MCHC: 31.7 g/dL (ref 30.0–36.0)
MCV: 81.4 fL (ref 80.0–100.0)
Monocytes Absolute: 1.1 10*3/uL — ABNORMAL HIGH (ref 0.1–1.0)
Monocytes Relative: 16 %
Neutro Abs: 4 10*3/uL (ref 1.7–7.7)
Neutrophils Relative %: 58 %
Platelets: UNDETERMINED 10*3/uL (ref 150–400)
RBC: 5.27 MIL/uL — ABNORMAL HIGH (ref 3.87–5.11)
RDW: 19.8 % — ABNORMAL HIGH (ref 11.5–15.5)
WBC: 6.8 10*3/uL (ref 4.0–10.5)
nRBC: 0.3 % — ABNORMAL HIGH (ref 0.0–0.2)

## 2020-01-05 LAB — URINALYSIS, ROUTINE W REFLEX MICROSCOPIC
Glucose, UA: NEGATIVE mg/dL
Ketones, ur: 5 mg/dL — AB
Leukocytes,Ua: NEGATIVE
Nitrite: NEGATIVE
Protein, ur: >=300 mg/dL — AB
Specific Gravity, Urine: 1.03 (ref 1.005–1.030)
pH: 6 (ref 5.0–8.0)

## 2020-01-05 LAB — ECHOCARDIOGRAM COMPLETE
Height: 59 in
Weight: 2800 oz

## 2020-01-05 LAB — COMPREHENSIVE METABOLIC PANEL
ALT: 56 U/L — ABNORMAL HIGH (ref 0–44)
AST: 98 U/L — ABNORMAL HIGH (ref 15–41)
Albumin: 2.9 g/dL — ABNORMAL LOW (ref 3.5–5.0)
Alkaline Phosphatase: 119 U/L (ref 38–126)
Anion gap: 13 (ref 5–15)
BUN: <5 mg/dL — ABNORMAL LOW (ref 6–20)
CO2: 24 mmol/L (ref 22–32)
Calcium: 7.9 mg/dL — ABNORMAL LOW (ref 8.9–10.3)
Chloride: 102 mmol/L (ref 98–111)
Creatinine, Ser: 0.7 mg/dL (ref 0.44–1.00)
GFR calc Af Amer: >60 mL/min (ref >60)
GFR calc non Af Amer: >60 mL/min (ref >60)
Glucose, Bld: 82 mg/dL (ref 70–99)
Potassium: 3.3 mmol/L — ABNORMAL LOW (ref 3.5–5.1)
Sodium: 139 mmol/L (ref 135–145)
Total Bilirubin: 2.3 mg/dL — ABNORMAL HIGH (ref 0.3–1.2)
Total Protein: 6.7 g/dL (ref 6.5–8.1)

## 2020-01-05 LAB — LACTIC ACID, PLASMA
Lactic Acid, Venous: 2.5 mmol/L (ref 0.5–1.9)
Lactic Acid, Venous: 1.4 mmol/L (ref 0.5–1.9)

## 2020-01-05 LAB — LIPASE, BLOOD: Lipase: 23 U/L (ref 11–51)

## 2020-01-05 LAB — I-STAT BETA HCG BLOOD, ED (MC, WL, AP ONLY): I-stat hCG, quantitative: 6.5 m[IU]/mL — ABNORMAL HIGH (ref <5)

## 2020-01-05 LAB — TROPONIN I (HIGH SENSITIVITY)
Troponin I (High Sensitivity): 7 ng/L (ref <18)
Troponin I (High Sensitivity): 5 ng/L (ref <18)

## 2020-01-05 LAB — PROTIME-INR
INR: 1.1 (ref 0.8–1.2)
Prothrombin Time: 14.1 seconds (ref 11.4–15.2)

## 2020-01-05 LAB — C DIFFICILE QUICK SCREEN W PCR REFLEX
C Diff antigen: NEGATIVE
C Diff interpretation: NOT DETECTED
C Diff toxin: NEGATIVE

## 2020-01-05 LAB — CK: Total CK: 155 U/L (ref 38–234)

## 2020-01-05 LAB — MAGNESIUM
Magnesium: 1.2 mg/dL — ABNORMAL LOW (ref 1.7–2.4)
Magnesium: 1.6 mg/dL — ABNORMAL LOW (ref 1.7–2.4)

## 2020-01-05 LAB — SARS CORONAVIRUS 2 BY RT PCR (HOSPITAL ORDER, PERFORMED IN ~~LOC~~ HOSPITAL LAB): SARS Coronavirus 2: NEGATIVE

## 2020-01-05 MED ORDER — TIZANIDINE HCL 2 MG PO TABS
2.0000 mg | ORAL_TABLET | Freq: Every evening | ORAL | Status: DC | PRN
  Administered 2020-01-05: 2 mg via ORAL
  Filled 2020-01-05: qty 1

## 2020-01-05 MED ORDER — ACETAMINOPHEN 500 MG PO TABS
1000.0000 mg | ORAL_TABLET | Freq: Once | ORAL | Status: AC
  Administered 2020-01-05: 1000 mg via ORAL
  Filled 2020-01-05: qty 2

## 2020-01-05 MED ORDER — MAGNESIUM SULFATE 2 GM/50ML IV SOLN
2.0000 g | Freq: Once | INTRAVENOUS | Status: AC
  Administered 2020-01-05: 2 g via INTRAVENOUS
  Filled 2020-01-05: qty 50

## 2020-01-05 MED ORDER — POTASSIUM CHLORIDE 10 MEQ/100ML IV SOLN
10.0000 meq | Freq: Once | INTRAVENOUS | Status: AC
  Administered 2020-01-05: 10 meq via INTRAVENOUS
  Filled 2020-01-05: qty 100

## 2020-01-05 MED ORDER — ENOXAPARIN SODIUM 40 MG/0.4ML ~~LOC~~ SOLN
40.0000 mg | SUBCUTANEOUS | Status: DC
  Administered 2020-01-05 – 2020-01-09 (×4): 40 mg via SUBCUTANEOUS
  Filled 2020-01-05 (×4): qty 0.4

## 2020-01-05 MED ORDER — AMLODIPINE BESYLATE 5 MG PO TABS
5.0000 mg | ORAL_TABLET | Freq: Every day | ORAL | Status: DC
  Administered 2020-01-05 – 2020-01-09 (×5): 5 mg via ORAL
  Filled 2020-01-05 (×5): qty 1

## 2020-01-05 MED ORDER — ALBUTEROL SULFATE HFA 108 (90 BASE) MCG/ACT IN AERS
2.0000 | INHALATION_SPRAY | RESPIRATORY_TRACT | Status: DC | PRN
  Filled 2020-01-05: qty 6.7

## 2020-01-05 MED ORDER — ALBUTEROL SULFATE (2.5 MG/3ML) 0.083% IN NEBU: 2.5000 mg | INHALATION_SOLUTION | RESPIRATORY_TRACT | Status: DC | PRN

## 2020-01-05 MED ORDER — SODIUM CHLORIDE 0.9 % IV SOLN
2.0000 g | INTRAVENOUS | Status: DC
  Administered 2020-01-05 – 2020-01-09 (×5): 2 g via INTRAVENOUS
  Filled 2020-01-05: qty 2
  Filled 2020-01-05 (×2): qty 20
  Filled 2020-01-05 (×4): qty 2

## 2020-01-05 MED ORDER — LACTATED RINGERS IV BOLUS
1000.0000 mL | Freq: Once | INTRAVENOUS | Status: AC
  Administered 2020-01-05: 1000 mL via INTRAVENOUS

## 2020-01-05 MED ORDER — SODIUM CHLORIDE 0.9 % IV SOLN: INTRAVENOUS | Status: AC

## 2020-01-05 MED ORDER — ACETAMINOPHEN 650 MG RE SUPP: 650.0000 mg | Freq: Four times a day (QID) | RECTAL | Status: DC | PRN

## 2020-01-05 MED ORDER — METOPROLOL TARTRATE 25 MG PO TABS
25.0000 mg | ORAL_TABLET | Freq: Two times a day (BID) | ORAL | Status: DC
  Administered 2020-01-05 – 2020-01-09 (×10): 25 mg via ORAL
  Filled 2020-01-05 (×10): qty 1

## 2020-01-05 MED ORDER — ACETAMINOPHEN 325 MG PO TABS
650.0000 mg | ORAL_TABLET | Freq: Four times a day (QID) | ORAL | Status: DC | PRN
  Administered 2020-01-09: 650 mg via ORAL
  Filled 2020-01-05: qty 2

## 2020-01-05 MED ORDER — POTASSIUM CHLORIDE 10 MEQ/100ML IV SOLN
10.0000 meq | INTRAVENOUS | Status: AC
  Administered 2020-01-05: 10 meq via INTRAVENOUS
  Filled 2020-01-05: qty 100

## 2020-01-05 MED ORDER — IOHEXOL 300 MG/ML  SOLN
100.0000 mL | Freq: Once | INTRAMUSCULAR | Status: AC | PRN
  Administered 2020-01-05: 100 mL via INTRAVENOUS

## 2020-01-05 NOTE — Progress Notes (Signed)
  Echocardiogram 2D Echocardiogram has been performed.  Marissa Weber 01/05/2020, 1:34 PM

## 2020-01-05 NOTE — ED Notes (Signed)
Lunch Tray Ordered 

## 2020-01-05 NOTE — ED Notes (Signed)
Tele Dinner ordered 

## 2020-01-05 NOTE — Progress Notes (Signed)
PHARMACY - PHYSICIAN COMMUNICATION CRITICAL VALUE ALERT - BLOOD CULTURE IDENTIFICATION (BCID)  Marissa Weber is an 59 y.o. female who presented to [facility] on 01/04/2020 with a chief complaint of nausea, vomiting, diarrhea and LoC.  Assessment:  Blood culture growing Strep spp, 1 of 3 bottles thus far.  Tmax 100.1, WBC WNL, LA 1.4.  Name of physician (or Provider) Contacted: Dr. Myna Hidalgo, no response yet  Current antibiotics: none  Changes to prescribed antibiotics recommended:  Recommendations accepted by provider  Start Rocephin 2gm IV Q24H  Results for orders placed or performed during the hospital encounter of 01/04/20  Blood Culture ID Panel (Reflexed) (Collected: 01/05/2020 12:31 AM)  Result Value Ref Range   Enterococcus species NOT DETECTED NOT DETECTED   Listeria monocytogenes NOT DETECTED NOT DETECTED   Staphylococcus species NOT DETECTED NOT DETECTED   Staphylococcus aureus (BCID) NOT DETECTED NOT DETECTED   Streptococcus species DETECTED (A) NOT DETECTED   Streptococcus agalactiae NOT DETECTED NOT DETECTED   Streptococcus pneumoniae NOT DETECTED NOT DETECTED   Streptococcus pyogenes NOT DETECTED NOT DETECTED   Acinetobacter baumannii NOT DETECTED NOT DETECTED   Enterobacteriaceae species NOT DETECTED NOT DETECTED   Enterobacter cloacae complex NOT DETECTED NOT DETECTED   Escherichia coli NOT DETECTED NOT DETECTED   Klebsiella oxytoca NOT DETECTED NOT DETECTED   Klebsiella pneumoniae NOT DETECTED NOT DETECTED   Proteus species NOT DETECTED NOT DETECTED   Serratia marcescens NOT DETECTED NOT DETECTED   Haemophilus influenzae NOT DETECTED NOT DETECTED   Neisseria meningitidis NOT DETECTED NOT DETECTED   Pseudomonas aeruginosa NOT DETECTED NOT DETECTED   Candida albicans NOT DETECTED NOT DETECTED   Candida glabrata NOT DETECTED NOT DETECTED   Candida krusei NOT DETECTED NOT DETECTED   Candida parapsilosis NOT DETECTED NOT DETECTED   Candida tropicalis NOT DETECTED NOT  DETECTED    Hibo Blasdell D. Mina Marble, PharmD, BCPS, [place] 01/05/2020, 9:10 PM

## 2020-01-05 NOTE — ED Notes (Signed)
Attempted repoirt

## 2020-01-05 NOTE — H&P (Signed)
History and Physical    Marissa Weber [id number] DOB: [birth year] DOA: 01/04/2020  PCP: [name], MD  Patient coming from: Home   Chief Complaint:  Chief Complaint  Patient presents with   Loss of Consciousness     HPI:   59 year old female with past medical history of paroxysmal atrial fibrillation, asthma, hypertension who presents to [facility] emergency department with nausea vomiting and diarrhea.  Patient explains that approximately 2 days ago she developed a sudden onset of nausea and vomiting.  Patient states that the nausea was severe with associated frequent bouts of nonbilious nonbloody vomiting.  Shortly thereafter the patient also began to develop frequent bouts of watery diarrhea without blood.  Patient complains of occasional generalized mild sore abdominal pain but this only seems to occur after a period of vomiting or diarrhea.  Over the next 2 days patient's symptoms persisted and was associated with development of generalized weakness and poor appetite.  Upon further questioning patient denies recent travel, recent ingestion of undercooked food, sick contacts or fevers.  Patient denies dysuria or low back pain.  Patient states that the evening of 5/10 the patient stood up from a seated position to walk to the bathroom at her son's house when she suddenly felt a bout of intense lightheadedness and lost consciousness, falling to the ground.  It was at this point that the patient came to [facility] emergency department for evaluation.  While in the waiting room at [facility] emergency department the patient again arose from a seated position experienced an episode of intense lightheadedness and again lost consciousness.  Patient denies tongue biting self urination or self defecation with either for episodes of loss of consciousness.  Upon evaluation in the emergency department, CT imaging of the abdomen pelvis reveals no evidence of acute  disease.  CT imaging of the head is unremarkable for acute disease.  Patient is been found to have substantial hypokalemia at 2.4 and substantial hypomagnesemia at 1.2.  Patient was hydrated on intravenous fluids and initiated on intravenous antiemetics.  The hospitalist group was then called to assess the patient for admission to the hospital.     Review of Systems: A 10-system review of systems has been performed and all systems are negative with the exception of what is listed in the HPI.    Past Medical History:  Diagnosis Date   Arthritis    hip  and knees    Asthma    hx of years ago    Atrial fibrillation ([place]) 11/2016   Blood transfusion    hx of years ago    Cholecystitis    Dysrhythmia    a fib   Hypertension     Past Surgical History:  Procedure Laterality Date   ANKLE SURGERY     BACK SURGERY     SPINAL FUSION   CESAREAN SECTION     cesearan     x4   CHOLECYSTECTOMY N/A 12/11/2013   Procedure: LAPAROSCOPIC CHOLECYSTECTOMY WITH INTRAOPERATIVE CHOLANGIOGRAM;  Surgeon: Imogene Burn. Georgette Dover, MD;  Location: [facility];  Service: General;  Laterality: N/A;   LEFT HEART CATH AND CORONARY ANGIOGRAPHY N/A 11/29/2016   Procedure: Left Heart Cath and Coronary Angiography;  Surgeon: [name], MD;  Location: [facility];  Service: Cardiovascular;  Laterality: N/A;   TOTAL HIP ARTHROPLASTY  10/22/2011   Procedure: TOTAL HIP ARTHROPLASTY;  Surgeon: Gearlean Alf, MD;  Location: [facility];  Service: Orthopedics;  Laterality: Right;  TOTAL HIP ARTHROPLASTY Left 12/05/2016   Procedure: LEFT TOTAL HIP ARTHROPLASTY ANTERIOR APPROACH;  Surgeon: Gaynelle Arabian, MD;  Location: [facility];  Service: Orthopedics;  Laterality: Left;     reports that she has been smoking cigarettes. She has a 5.00 pack-year smoking history. She has never used smokeless tobacco. She reports current alcohol use of about 5.0 standard drinks of alcohol per week. She reports that she does not use  drugs.  Allergies  Allergen Reactions   Bee Venom Swelling and Other (See Comments)    Swelling wherever the sting is. Also gets dizzy   Lisinopril Swelling    No family history on file.   Prior to Admission medications   Medication Sig Start Date End Date Taking? Authorizing Provider  albuterol (PROVENTIL HFA;VENTOLIN HFA) 108 (90 Base) MCG/ACT inhaler Inhale 2 puffs into the lungs every 4 (four) hours as needed for wheezing or shortness of breath.   Yes [provider]  amLODipine (NORVASC) 5 MG tablet Take 5 mg by mouth daily. 09/01/19  Yes [provider]  aspirin EC 81 MG tablet Take 81 mg by mouth daily.   Yes [provider]  metoprolol tartrate (LOPRESSOR) 25 MG tablet Take 1 tablet (25 mg total) by mouth 2 (two) times daily. 11/30/16  Yes [name], MD  tiZANidine (ZANAFLEX) 2 MG tablet Take 2 mg by mouth at bedtime as needed for muscle spasms.   Yes [provider]  diltiazem (CARDIZEM) 60 MG tablet Take 0.5 tablets (30 mg total) by mouth 2 (two) times daily. Patient not taking: Reported on 01/05/2020 12/18/16   Patrecia Pour, Christean Grief, MD  methocarbamol (ROBAXIN) 500 MG tablet Take 1 tablet (500 mg total) by mouth every 6 (six) hours as needed for muscle spasms. Patient not taking: Reported on 01/05/2020 12/06/16   Dara Lords, Alexzandrew L, PA-C  oxyCODONE (OXY IR/ROXICODONE) 5 MG immediate release tablet Take 1-2 tablets (5-10 mg total) by mouth every 6 (six) hours as needed for moderate pain or severe pain. Patient not taking: Reported on 01/05/2020 12/18/16   Patrecia Pour, Christean Grief, MD  rivaroxaban (XARELTO) 10 MG TABS tablet Take 1 tablet (10 mg total) by mouth daily with breakfast. Take Xarelto for two and a half more weeks following discharge from the hospital, then discontinue Xarelto. Once the patient has completed the blood thinner regimen, then take a Baby 81 mg Aspirin daily for three more weeks. Patient not taking: Reported on 01/05/2020 12/06/16    Joelene Millin, PA-C    Physical Exam: Vitals:   01/05/20 0131 01/05/20 0200 01/05/20 0330 01/05/20 0515  BP: (!) 163/103 (!) 149/107    Pulse: (!) 102 (!) 105 (!) 120 (!) 103  Resp: (!) 23 (!) 22 (!) 23 20  Temp: 100.1 F (37.8 C)     TempSrc: Oral     SpO2: 93% 94% 96% 95%  Weight:      Height:        Constitutional: Lethargic but arousable and oriented x3, no associated distress.   Skin: no rashes, no lesions, notable poor skin turgor Eyes: Pupils are equally reactive to light.  No evidence of scleral icterus or conjunctival pallor.  ENMT: Dry mucous membranes noted posterior pharynx clear of any exudate or lesions.   Neck: normal, supple, no masses, no thyromegaly.  No evidence of jugular venous distension.   Respiratory: clear to auscultation bilaterally, no wheezing, no crackles. Normal respiratory effort. No accessory muscle use.  Cardiovascular: Regular rate and rhythm, no murmurs /  rubs / gallops. No extremity edema. 2+ pedal pulses. No carotid bruits.  Chest:   Nontender without crepitus or deformity.   Back:   Nontender without crepitus or deformity. Abdomen: Mild generalized abdominal tenderness.  Abdomen is soft.  Notable ventral hernia that is reducible and minimally tender.  Slightly hypoactive bowel sounds noted.  No evidence of intra-abdominal masses. Musculoskeletal: No joint deformity upper and lower extremities. Good ROM, no contractures. Normal muscle tone.  Neurologic: Patient is lethargic but arousable and oriented x3.  Patient is following all commands.  Patient is responsive to verbal and painful stimuli.  Patient's sensation is intact.  Patient is moving all 4 extremities spontaneously. Psychiatric: Patient exhibits a depressed mood with flat affect.  Patient seems to possess insight as to theircurrent situation.     Labs on Admission: I have personally reviewed following labs and imaging studies -   CBC: Recent Labs  Lab 01/04/20 2222  WBC  4.5  NEUTROABS 3.0  HGB 15.6*  HCT 48.3*  MCV 80.0  PLT 97*   Basic Metabolic Panel: Recent Labs  Lab 01/04/20 2222  NA 137  K 2.4*  CL 93*  CO2 28  GLUCOSE 135*  BUN <5*  CREATININE 0.90  CALCIUM 8.6*  MG 1.2*   GFR: Estimated Creatinine Clearance: 62.1 mL/min (by C-G formula based on SCr of 0.9 mg/dL). Liver Function Tests: Recent Labs  Lab 01/04/20 2222  AST 120*  ALT 70*  ALKPHOS 147*  BILITOT 2.5*  PROT 7.8  ALBUMIN 3.2*   Recent Labs  Lab 01/04/20 2222  LIPASE 23   No results for input(s): AMMONIA in the last 168 hours. Coagulation Profile: No results for input(s): INR, PROTIME in the last 168 hours. Cardiac Enzymes: No results for input(s): CKTOTAL, CKMB, CKMBINDEX, TROPONINI in the last 168 hours. BNP (last 3 results) No results for input(s): PROBNP in the last 8760 hours. HbA1C: No results for input(s): HGBA1C in the last 72 hours. CBG: No results for input(s): GLUCAP in the last 168 hours. Lipid Profile: No results for input(s): CHOL, HDL, LDLCALC, TRIG, CHOLHDL, LDLDIRECT in the last 72 hours. Thyroid Function Tests: No results for input(s): TSH, T4TOTAL, FREET4, T3FREE, THYROIDAB in the last 72 hours. Anemia Panel: No results for input(s): VITAMINB12, FOLATE, FERRITIN, TIBC, IRON, RETICCTPCT in the last 72 hours. Urine analysis:    Component Value Date/Time   COLORURINE AMBER (A) 01/04/2020 2252   APPEARANCEUR HAZY (A) 01/04/2020 2252   LABSPEC 1.030 01/04/2020 2252   PHURINE 6.0 01/04/2020 2252   GLUCOSEU NEGATIVE 01/04/2020 2252   HGBUR SMALL (A) 01/04/2020 2252   BILIRUBINUR SMALL (A) 01/04/2020 2252   KETONESUR 5 (A) 01/04/2020 2252   PROTEINUR >=300 (A) 01/04/2020 2252   UROBILINOGEN 1.0 10/15/2011 1410   NITRITE NEGATIVE 01/04/2020 2252   LEUKOCYTESUR NEGATIVE 01/04/2020 2252    Radiological Exams on Admission - Personally Reviewed: DG Ribs Unilateral W/Chest Left  Result Date: 01/05/2020 CLINICAL DATA:  [place], left lateral  rib pain EXAM: LEFT RIBS AND CHEST - 3+ VIEW COMPARISON:  12/24/2018 FINDINGS: Frontal and oblique views of the left thoracic cage are obtained. Cardiac silhouette is unremarkable. No airspace disease, effusion, or pneumothorax. No acute displaced rib fractures. IMPRESSION: 1. No acute intrathoracic process. Electronically Signed   By: Randa Ngo M.D.   On: 01/05/2020 00:02   CT Head Wo Contrast  Result Date: 01/05/2020 CLINICAL DATA:  Dizziness, fall, headache, hypertension EXAM: CT HEAD WITHOUT CONTRAST TECHNIQUE: Contiguous axial images were obtained from  the base of the skull through the vertex without intravenous contrast. COMPARISON:  10/01/2018 FINDINGS: Brain: No acute infarct or hemorrhage. Lateral ventricles and midline structures are unremarkable. No acute extra-axial fluid collections. No mass effect. Vascular: No hyperdense vessel or unexpected calcification. Skull: Normal. Negative for fracture or focal lesion. Sinuses/Orbits: No acute finding. Other: None. IMPRESSION: 1. Stable head CT, no acute process. Electronically Signed   By: Randa Ngo M.D.   On: 01/05/2020 00:09   CT ABDOMEN PELVIS W CONTRAST  Result Date: 01/05/2020 CLINICAL DATA:  Nausea, vomiting, and diarrhea. Generalized abdominal pain EXAM: CT ABDOMEN AND PELVIS WITH CONTRAST TECHNIQUE: Multidetector CT imaging of the abdomen and pelvis was performed using the standard protocol following bolus administration of intravenous contrast. CONTRAST:  153mL OMNIPAQUE IOHEXOL 300 MG/ML  SOLN COMPARISON:  None. FINDINGS: Lower chest:  No contributory findings. Hepatobiliary: Hepatic steatosis.Cholecystectomy. Pancreas: Unremarkable. Spleen: Unremarkable. Adrenals/Urinary Tract: Negative adrenals. No hydronephrosis or stone. Undulating left more than right renal cortex attributed to mild patchy scarring. Unremarkable bladder when allowing for artifact from bilateral hip prosthesis. Stomach/Bowel: No obstruction or visible  inflammation, including appendicitis. Vascular/Lymphatic: No acute vascular abnormality. Mild aortoiliac atherosclerosis. No mass or adenopathy. Reproductive:No pathologic findings. Other: No ascites or pneumoperitoneum. Fatty umbilical to infra umbilical hernia. Musculoskeletal: No acute abnormalities. Bilateral hip arthroplasty. L3-4 and L4-5 PLIF. There is adjacent segment L2-3 disc and facet degeneration with spinal stenosis. IMPRESSION: 1. No acute finding. 2. Hepatic steatosis. Electronically Signed   By: Monte Fantasia M.D.   On: 01/05/2020 05:12   US Abdomen Limited RUQ  Result Date: 01/05/2020 CLINICAL DATA:  Elevated LFTs, palpable abdominal abnormality EXAM: ULTRASOUND ABDOMEN LIMITED RIGHT UPPER QUADRANT COMPARISON:  12/11/2013 FINDINGS: Gallbladder: Surgically absent Common bile duct: Diameter: 4 mm Liver: No focal lesion identified. Within normal limits in parenchymal echogenicity. Portal vein is patent on color Doppler imaging with normal direction of blood flow towards the liver. Other: Ill-defined 4.5 x 2.9 x 4.1 cm hypoechoic area is seen within the right anterior abdominal wall just lateral to the umbilicus, corresponding to the palpable abnormality. This likely reflects an umbilical hernia, with abdominal wall defect measuring 1.9 cm. CT may be useful if further evaluation is desired. IMPRESSION: 1. Probable periumbilical ventral hernia, corresponding to palpable abnormality. CT could be performed for further characterization. 2. Cholecystectomy. 3. Otherwise unremarkable exam. Electronically Signed   By: Randa Ngo M.D.   On: 01/05/2020 02:31    EKG: Personally reviewed.  Rhythm is sinus tachycardia with heart rate of 119 bpm.  No dynamic ST segment changes appreciated.  Assessment/Plan Principal Problem:   Nausea vomiting and diarrhea   Patient presenting with 2-day history of intractable nausea vomiting and diarrhea.  CT imaging of abdomen and pelvis revealed no evidence  of acute infection or obstruction.    Urinalysis reveals no evidence of urinary tract infection  At this point, most likely etiology of patient's presentation is acute gastroenteritis, likely viral.  Provided patient with intravenous antiemetics  Replacing substantial hypokalemia and hypomagnesemia  Obtaining stool studies considering severity of presentation  Blood cultures obtained  Abstaining from empiric anabiotic's at this time unless patient clinically worsens  Active Problems:   Acute gastroenteritis   Please see assessment and plan above    SIRS (systemic inflammatory response syndrome) ([place])   Patient exhibiting tachycardia and tachypnea, thought to be secondary to acute gastroenteritis with morning completion  In case patient's presentation is felt to be most likely be secondary to acute gastroenteritis, likely  viral  Considering severity of patient's presentation series have been obtained, stool studies have been ordered.  Lactic Acidosis   Lactic acidosis likely secondary to substantial volume depletion  Hydrate patient aggressively with intravenous isotonic fluids  Performing serial lactic acid levels to ensure downtrending and resolution    Orthostatic syncope   Patient presenting with 2 episodes of syncope, shortly after arising from a seated position suggestive of orthostatic syncope  Obtaining serial orthostatic vital signs  Obtaining echocardiogram  Hydrating patient with intravenous isotonic fluids  Monitoring patient on telemetry    AF (paroxysmal atrial fibrillation) ([place])   Patient is currently in sinus tachycardia  We will continue home regimen of scheduled twice daily metoprolol  Patient is currently not on anticoagulation for atrial fibrillation in the home setting and therefore this will not be provided during this hospital stay  Monitoring patient on telemetry    Hypokalemia due to excessive gastrointestinal loss of  potassium   Replacing via oral and intravenous means  Monitoring potassium levels with serial chemistries    Hypomagnesemia  Replacing via intravenous magnesium sulfate  Monitoring magnesium levels with serial chemistries    Abnormal LFTs  Etiology is not entirely clear, possibly secondary to progressive nonalcoholic hepatic steatosis  Hepatic function panel ordered  Upper quadrant ultrasound reveals no evidence of hepatobiliary disease   Code Status:  Full code Family Communication: Deferred  Status is: Observation  The patient remains OBS appropriate and will d/c before 2 midnights.  Dispo: The patient is from: Home              Anticipated d/c is to: Home              Anticipated d/c date is: 2 days              Patient currently is not medically stable to d/c.        Vernelle Emerald MD [facility] Pager [phone]  If 7PM-7AM, please contact night-coverage [email or web address] Use [address] password for that web site. If you do not have the password, please call the hospital operator.  01/05/2020, 6:44 AM

## 2020-01-05 NOTE — Evaluation (Signed)
Physical Therapy Evaluation Patient Details Name: Marissa Weber MRN: [id number] DOB: Feb 15, 1961 Today's Date: 01/05/2020   History of Present Illness  59 year old female with past medical history of paroxysmal atrial fibrillation, asthma, hypertension who presents to [facility] emergency department with nausea vomiting and diarrhea.  Pt with multiple falls.   Clinical Impression  Pt admitted with above diagnosis. Pt was limited to bed level eval as nurse states pt has had multiple falls as well as pt had to be placed on bedpan during session. Pt able to perform light exercises in bed.  Pt will need therapy prior to d/c home and Rehab would be the best choice for pt to receive therapy and gain independence to either go back to sons or to fiancees home. Will follow acutely.  Pt currently with functional limitations due to the deficits listed below (see PT Problem List). Pt will benefit from skilled PT to increase their independence and safety with mobility to allow discharge to the venue listed below.      Follow Up Recommendations Supervision/Assistance - 24 hour;CIR    Equipment Recommendations  Other (comment)(TBA)    Recommendations for Other Services Rehab consult     Precautions / Restrictions Precautions Precautions: Fall Restrictions Weight Bearing Restrictions: No      Mobility  Bed Mobility Overal bed mobility: Needs Assistance Bed Mobility: Rolling Rolling: Supervision         General bed mobility comments: rolled to place bedpan with rails. Nurse asked PT to perform bed level eval. PT was going to sit pt EOB but pt with continuous loose stools therefore rollng and strength assessment was all PT could do today.   Transfers                 General transfer comment: TBA  Ambulation/Gait             General Gait Details: TBA  Stairs            Wheelchair Mobility    Modified Rankin (Stroke Patients Only)       Balance                                             Pertinent Vitals/Pain Pain Assessment: Faces Faces Pain Scale: Hurts whole lot Pain Location: right shoulder and lt side Pain Descriptors / Indicators: Aching;Grimacing;Guarding Pain Intervention(s): Limited activity within patient's tolerance;Monitored during session;Repositioned;Patient requesting pain meds-RN notified    Home Living Family/patient expects to be discharged to:: Private residence Living Arrangements: Children(visiting son but stays with fiancee normally) Available Help at Discharge: Available PRN/intermittently(disabled fiancee) Type of Home: Apartment Home Access: Stairs to enter Entrance Stairs-Rails: Can reach both;Left;Right Entrance Stairs-Number of Steps: 4 Home Layout: Bed/bath upstairs;Two level Home Equipment: Walker - 2 wheels;Walker - 4 wheels;Cane - single point;Bedside commode Additional Comments: plans to stayh with son and she can use the 3N1 and sponge bathe as he has no bath downstairs    Prior Function Level of Independence: Needs assistance   Gait / Transfers Assistance Needed: was using cane because rollator hard to get in car, used rollator at times, had 3 falls in last month with cane  ADL's / Homemaking Assistance Needed: Bathes and dresses self, sponge bathed  Comments: was still driving      Hand Dominance   Dominant Hand: Right    Extremity/Trunk Assessment  Upper Extremity Assessment Upper Extremity Assessment: Defer to OT evaluation    Lower Extremity Assessment Lower Extremity Assessment: Generalized weakness       Communication   Communication: No difficulties  Cognition Arousal/Alertness: Awake/alert Behavior During Therapy: WFL for tasks assessed/performed Overall Cognitive Status: Within Functional Limits for tasks assessed                                        General Comments General comments (skin integrity, edema, etc.): 105 bpm, 98% RA,  137/94. Could not take orthostatics as pt with loose stools and nurse did not want pt up.    Exercises General Exercises - Upper Extremity Shoulder Flexion: AROM;Both;5 reps;Supine Elbow Flexion: AROM;Both;5 reps;Supine General Exercises - Lower Extremity Ankle Circles/Pumps: AROM;Both;5 reps;Supine Heel Slides: AROM;Both;5 reps;Supine   Assessment/Plan    PT Assessment Patient needs continued PT services  PT Problem List Decreased activity tolerance;Decreased balance;Decreased mobility;Decreased knowledge of use of DME;Decreased safety awareness;Decreased knowledge of precautions;Pain;Decreased strength       PT Treatment Interventions DME instruction;Gait training;Therapeutic activities;Functional mobility training;Therapeutic exercise;Balance training;Patient/family education    PT Goals (Current goals can be found in the Care Plan section)  Acute Rehab PT Goals Patient Stated Goal: to go to Rehab PT Goal Formulation: With patient Time For Goal Achievement: 01/19/20 Potential to Achieve Goals: Good    Frequency Min 3X/week   Barriers to discharge Decreased caregiver support      Co-evaluation               AM-PAC PT "6 Clicks" Mobility  Outcome Measure Help needed turning from your back to your side while in a flat bed without using bedrails?: None Help needed moving from lying on your back to sitting on the side of a flat bed without using bedrails?: None Help needed moving to and from a bed to a chair (including a wheelchair)?: Total Help needed standing up from a chair using your arms (e.g., wheelchair or bedside chair)?: Total Help needed to walk in hospital room?: Total Help needed climbing 3-5 steps with a railing? : Total 6 Click Score: 12    End of Session Equipment Utilized During Treatment: Gait belt Activity Tolerance: Patient limited by fatigue Patient left: with call bell/phone within reach;in bed Nurse Communication: Mobility status PT Visit  Diagnosis: Unsteadiness on feet (R26.81);Muscle weakness (generalized) (M62.81);Pain Pain - Right/Left: Right Pain - part of body: Shoulder    Time: 0944-1000 PT Time Calculation (min) (ACUTE ONLY): 16 min   Charges:   PT Evaluation $PT Eval Moderate Complexity: 1 Mod          Ellana Kawa W,PT Acute Rehabilitation Services Pager:  [phone]  Office:  [phone]    Denice Paradise 01/05/2020, 11:29 AM

## 2020-01-05 NOTE — Progress Notes (Addendum)
Patient seen and examined personally, I reviewed the chart, history and physical and admission note, done by admitting physician this morning and agree with the same with following addendum.  Please refer to the morning admission note for more detailed plan of care.  Briefly, 59 year old female with multiple comorbidities including PAF, asthma, hypertension, obesity with BMI 35.3 presents with 2-day history of intractable nausea, vomiting, diarrhea.  In the ED found to have lactic acidosis dehydration severe hypokalemia, severe hypomagnesemia.  CT imaging of the abdomen and urinalysis unremarkable.  Patient also had syncope on standing up on 5/10 and in the ED.  CT head no acute finding.  COVID-19 negative. Patient is felt to be suffering from severe viral gastroenteritis with significant electrolyte imbalance and is being admitted and being hydrated.  Blood culture stool studies sent on admission.  On exam patient complains of ongoing diarrhea last episode this morning also nausea  Issues: Acute viral gastroenteritis-with ongoing symptoms, being managed conservatively. Pending  stoolstudies Syncope suspected to be from severe dehydration/?  Vasovagal-troponin negative, CT head negative. Electrolyte imbalance-severe hypokalemia, hypokalemia, hypomagnesemia Mild anion gap, normall hco3. Lactic acidosis from severe dehydration, improving 3.7>2.5> 1.4 w/ ivf. Abnormal LFTs with AST 120, ALT 70 total bili 2.5.  iproving, check acute viral hepatitis panel PAF-troponin negative.  On metoprolol. Hypertension: BP stable on metoprolol. amlodipine. Asthma-not in exacerbation Obesity BMI 35.3  Plan is to continue on IV fluid hydration IV fluids and aggressive replacement of electrolytes.start the diet slowly and advance as tolerated. Refer to H&P for more detail Patient remains hospitalized due to ongoing symptoms of viral gastroenteritis, patient will be moved to inpatient status.

## 2020-01-05 NOTE — ED Notes (Signed)
Pt c/o pain in left side and right shoulder- thinks it was bruised with fall-- full ROM in shoulder--

## 2020-01-05 NOTE — ED Notes (Signed)
Transported to [place].

## 2020-01-06 LAB — TSH: TSH: 1.649 u[IU]/mL (ref 0.350–4.500)

## 2020-01-06 LAB — GASTROINTESTINAL PANEL BY PCR, STOOL (REPLACES STOOL CULTURE)

## 2020-01-06 LAB — HIV ANTIBODY (ROUTINE TESTING W REFLEX): HIV Screen 4th Generation wRfx: NONREACTIVE

## 2020-01-06 LAB — HEPATITIS PANEL, ACUTE
HCV Ab: NONREACTIVE
Hep A IgM: NONREACTIVE
Hep B C IgM: NONREACTIVE
Hepatitis B Surface Ag: NONREACTIVE

## 2020-01-06 LAB — CBC
HCT: 38.8 % (ref 36.0–46.0)
Hemoglobin: 12.3 g/dL (ref 12.0–15.0)
MCH: 25.7 pg — ABNORMAL LOW (ref 26.0–34.0)
MCHC: 31.7 g/dL (ref 30.0–36.0)
MCV: 81.2 fL (ref 80.0–100.0)
Platelets: 81 10*3/uL — ABNORMAL LOW (ref 150–400)
RBC: 4.78 MIL/uL (ref 3.87–5.11)
RDW: 19 % — ABNORMAL HIGH (ref 11.5–15.5)
WBC: 5.8 10*3/uL (ref 4.0–10.5)
nRBC: 0 % (ref 0.0–0.2)

## 2020-01-06 LAB — COMPREHENSIVE METABOLIC PANEL
ALT: 47 U/L — ABNORMAL HIGH (ref 0–44)
AST: 68 U/L — ABNORMAL HIGH (ref 15–41)
Albumin: 2.6 g/dL — ABNORMAL LOW (ref 3.5–5.0)
Alkaline Phosphatase: 94 U/L (ref 38–126)
Anion gap: 9 (ref 5–15)
BUN: <5 mg/dL — ABNORMAL LOW (ref 6–20)
CO2: 22 mmol/L (ref 22–32)
Calcium: 7.6 mg/dL — ABNORMAL LOW (ref 8.9–10.3)
Chloride: 104 mmol/L (ref 98–111)
Creatinine, Ser: 0.59 mg/dL (ref 0.44–1.00)
GFR calc Af Amer: >60 mL/min (ref >60)
GFR calc non Af Amer: >60 mL/min (ref >60)
Glucose, Bld: 88 mg/dL (ref 70–99)
Potassium: 3.1 mmol/L — ABNORMAL LOW (ref 3.5–5.1)
Sodium: 135 mmol/L (ref 135–145)
Total Bilirubin: 1.6 mg/dL — ABNORMAL HIGH (ref 0.3–1.2)
Total Protein: 5.9 g/dL — ABNORMAL LOW (ref 6.5–8.1)

## 2020-01-06 LAB — VITAMIN B12: Vitamin B-12: 611 pg/mL (ref 180–914)

## 2020-01-06 LAB — AMMONIA: Ammonia: 51 umol/L — ABNORMAL HIGH (ref 9–35)

## 2020-01-06 LAB — MAGNESIUM: Magnesium: 1.6 mg/dL — ABNORMAL LOW (ref 1.7–2.4)

## 2020-01-06 MED ORDER — IPRATROPIUM-ALBUTEROL 0.5-2.5 (3) MG/3ML IN SOLN: 3.0000 mL | RESPIRATORY_TRACT | Status: DC | PRN

## 2020-01-06 MED ORDER — MAGNESIUM SULFATE 2 GM/50ML IV SOLN
2.0000 g | Freq: Once | INTRAVENOUS | Status: AC
  Administered 2020-01-06: 2 g via INTRAVENOUS
  Filled 2020-01-06: qty 50

## 2020-01-06 MED ORDER — FOLIC ACID 1 MG PO TABS
1.0000 mg | ORAL_TABLET | Freq: Every day | ORAL | Status: DC
  Administered 2020-01-06 – 2020-01-10 (×5): 1 mg via ORAL
  Filled 2020-01-06 (×5): qty 1

## 2020-01-06 MED ORDER — POTASSIUM CHLORIDE 10 MEQ/100ML IV SOLN: 10.0000 meq | INTRAVENOUS | Status: DC

## 2020-01-06 MED ORDER — POTASSIUM CHLORIDE CRYS ER 20 MEQ PO TBCR
30.0000 meq | EXTENDED_RELEASE_TABLET | ORAL | Status: AC
  Administered 2020-01-06 (×2): 30 meq via ORAL
  Filled 2020-01-06 (×2): qty 1

## 2020-01-06 MED ORDER — NICOTINE 14 MG/24HR TD PT24
14.0000 mg | MEDICATED_PATCH | Freq: Every day | TRANSDERMAL | Status: DC
  Administered 2020-01-06: 14 mg via TRANSDERMAL
  Filled 2020-01-06 (×2): qty 1

## 2020-01-06 MED ORDER — LORAZEPAM 1 MG PO TABS
1.0000 mg | ORAL_TABLET | ORAL | Status: AC | PRN
  Administered 2020-01-06: 2 mg via ORAL
  Filled 2020-01-06 (×2): qty 2

## 2020-01-06 MED ORDER — THIAMINE HCL 100 MG/ML IJ SOLN: 100.0000 mg | Freq: Every day | INTRAMUSCULAR | Status: DC

## 2020-01-06 MED ORDER — HALOPERIDOL LACTATE 5 MG/ML IJ SOLN
2.0000 mg | Freq: Four times a day (QID) | INTRAMUSCULAR | Status: AC | PRN
  Administered 2020-01-06: 2 mg via INTRAVENOUS
  Filled 2020-01-06: qty 1

## 2020-01-06 MED ORDER — MAGNESIUM OXIDE 400 (241.3 MG) MG PO TABS
800.0000 mg | ORAL_TABLET | Freq: Once | ORAL | Status: AC
  Administered 2020-01-06: 800 mg via ORAL
  Filled 2020-01-06: qty 2

## 2020-01-06 MED ORDER — LACTATED RINGERS IV SOLN: INTRAVENOUS | Status: AC

## 2020-01-06 MED ORDER — BUDESONIDE 0.5 MG/2ML IN SUSP
0.5000 mg | Freq: Two times a day (BID) | RESPIRATORY_TRACT | Status: DC
  Administered 2020-01-06 – 2020-01-08 (×2): 0.5 mg via RESPIRATORY_TRACT
  Filled 2020-01-06 (×5): qty 2

## 2020-01-06 MED ORDER — THIAMINE HCL 100 MG PO TABS
100.0000 mg | ORAL_TABLET | Freq: Every day | ORAL | Status: DC
  Administered 2020-01-06: 100 mg via ORAL
  Filled 2020-01-06: qty 1

## 2020-01-06 MED ORDER — ADULT MULTIVITAMIN W/MINERALS CH
1.0000 | ORAL_TABLET | Freq: Every day | ORAL | Status: DC
  Administered 2020-01-06 – 2020-01-09 (×4): 1 via ORAL
  Filled 2020-01-06 (×4): qty 1

## 2020-01-06 MED ORDER — LORAZEPAM 2 MG/ML IJ SOLN
1.0000 mg | INTRAMUSCULAR | Status: AC | PRN
  Administered 2020-01-06: 2 mg via INTRAVENOUS
  Administered 2020-01-08: 4 mg via INTRAVENOUS
  Filled 2020-01-06: qty 2
  Filled 2020-01-06: qty 1

## 2020-01-06 MED ORDER — IPRATROPIUM-ALBUTEROL 0.5-2.5 (3) MG/3ML IN SOLN: 3.0000 mL | Freq: Three times a day (TID) | RESPIRATORY_TRACT | Status: DC

## 2020-01-06 MED ORDER — LACTULOSE 10 GM/15ML PO SOLN
20.0000 g | Freq: Three times a day (TID) | ORAL | Status: DC
  Administered 2020-01-06 – 2020-01-09 (×9): 20 g via ORAL
  Filled 2020-01-06 (×8): qty 30

## 2020-01-06 NOTE — Progress Notes (Addendum)
Pt given haldol at this time due to increased agitation, pt getting up OOB, messing with IV site.  unsteady walking around room, stating she going to bathroom, did not, then sat down in chair. Pt's 2 daughters and granddaughter at bedside, safety sitter at bedside --no one able to get pt to settle down, nurse gave  IV haldol.    2 daughters left, granddaughter at pt side, pt in recliner at this time. Safety sitter in room.    GI panel for stool resulted and negative, Enteric precautions d/c'd at this time.

## 2020-01-06 NOTE — Progress Notes (Signed)
Patient observed with lighter and cigarettes in her possession. Patient had lit a cigarette that the sitter was able to get from the patient and extinguish. I asked the patient to give me the cigarettes and lighter and she refused. I spoke with security and the asked me to relay to her that if she did not relinquish these items they would be taken from her by security. Patient states that she is going to light the cigarette and give it to "[employer]" who she believes to be sitting in the corner of her room. Patient's daughter then takes her mother's belongings including the cigarettes and both of her lighters along with her purse. Security was available in the hall if needed.

## 2020-01-06 NOTE — Progress Notes (Signed)
Information given by pt's daughters passed on to Dr Reesa Chew     daughters have informed nurse that pt drinks beer daily at home... 40oz bottles anywhere from 2-6 bottles a day. drank a lot Saturday before mother's day, then was sick N/V on Sunday mother's day, ...then came to hospital.

## 2020-01-06 NOTE — Progress Notes (Signed)
PROGRESS NOTE    Marissa Weber  YHC:[id number] DOB: 07/20/1961 DOA: 01/04/2020 PCP: [name], MD   Brief Narrative: 59 year old female with multiple comorbidities including PAF, asthma, hypertension, obesity with BMI 35.3 presents with 2-day history of intractable nausea, vomiting, diarrhea.  In the ED found to have lactic acidosis dehydration severe hypokalemia, severe hypomagnesemia.  CT imaging of the abdomen and urinalysis unremarkable.  Patient also had syncope on standing up on 5/10 and in the ED.  CT head no acute finding.  COVID-19 negative.  Admitted with suspicion of acute viral gastroenteritis causing severe dehydration and syncope.    Assessment & Plan:   Principal Problem:   Nausea vomiting and diarrhea Active Problems:   AF (paroxysmal atrial fibrillation) (HCC)   Acute gastroenteritis   Hypokalemia due to excessive gastrointestinal loss of potassium   Hypomagnesemia   SIRS (systemic inflammatory response syndrome) (HCC)   Orthostatic syncope   Abnormal LFTs   Lactic acidosis   Intractable nausea and vomiting  Nausea vomiting diarrhea likely secondary to acute viral gastroenteritis SIRS secondary to dehydration acute viral gastroenteritis -CT of the abdomen pelvis is negative for acute pathology.  UA-negative. -Supportive care, antiemetics. -Stool studies-negative -GI panel-pending -Blood cultures-no growth  Streptococcus bacteremia Acute bronchitis with daily tobacco use Nicotine patch ordered.  Continue IV Rocephin. Scheduled and as needed bronchodilators.  Pulmicort. Low threshold to start prednisone if necessary.  Currently not wheezing or hypoxic. Supplemental oxygen as necessary.  Acute metabolic encephalopathy Her exam is nonfocal.  CT head negative.  Check ammonia levels if elevated will start lactulose.  Check TSH.  Delirium protocol.  Daily alcohol use Alcohol withdrawal protocol.  Check folate, vitamin B12.   Moderate to severe dehydration,  intravascular volume loss Lactic acidosis/hypokalemia/hypomagnesemia -Aggressive hydration.  Aggressive repletion of electrolytes  Orthostatic syncope secondary to dehydration -Echocardiogram-EF 55 to 60%.  Moderate LVH, grade 1 DD. -IV fluids.  Transaminitis, improving -Right upper quadrant ultrasound-periumbilical ventral hernia.  Overall unremarkable.  History of paroxysmal atrial fibrillation -On metoprolol.  On daily aspirin.  Essential hypertension -Norvasc, metoprolol  History of asthma  -Currently not in exacerbation.   DVT prophylaxis: Lovenox Code Status: Full code Family Communication: Spoke with daughter at bedside  Status is: Inpatient  Remains inpatient appropriate because:Altered mental status   Dispo: The patient is from: Home              Anticipated d/c is to: Home              Anticipated d/c date is: 2 days              Patient currently is not medically stable to d/c.  Patient is still confused and alert to her name only.  Ongoing evaluation for bacteremia as well.    Subjective: Seen and examined at bedside she is sitting at the side of the bed does not have any complaints.  Keeps telling me "she wants to go to the ER to see a doctor" " she was coming to visit me to have IV line placed."  Met with the daughter at bedside who told me that patient gets confused very frequently at home.  Review of Systems Otherwise negative except as per HPI, including: General: Denies fever, chills, night sweats or unintended weight loss. Resp: Denies cough, wheezing, shortness of breath. Cardiac: Denies chest pain, palpitations, orthopnea, paroxysmal nocturnal dyspnea. GI: Denies abdominal pain, nausea, vomiting, diarrhea or constipation GU: Denies dysuria, frequency, hesitancy or incontinence MS: Denies muscle aches,  joint pain or swelling Neuro: Denies headache, neurologic deficits (focal weakness, numbness, tingling), abnormal gait Psych: Denies anxiety,  depression, SI/HI/AVH Skin: Denies new rashes or lesions ID: Denies sick contacts, exotic exposures, travel  Examination: Constitutional: Not in acute distress, dry mouth. Respiratory: Diffuse coarse breath sounds Cardiovascular: Normal sinus rhythm, no rubs Abdomen: Nontender nondistended good bowel sounds Musculoskeletal: No edema noted Skin: No rashes seen Neurologic: CN 2-12 grossly intact.  And nonfocal Psychiatric: Poor judgment and insight.  Alert to her name only. Objective: Vitals:   01/05/20 2000 01/05/20 2107 01/06/20 0154 01/06/20 0433  BP: 121/89 121/89 139/87 (!) 144/96  Pulse:  86 69 87  Resp: (!) 25 (!) '22 17 17  ' Temp:  98.9 F (37.2 C) 98.9 F (37.2 C) 98.9 F (37.2 C)  TempSrc:  Oral Oral Oral  SpO2:  98% 97% 99%  Weight:  80.8 kg    Height:  '4\' 11"'  (1.499 m)      Intake/Output Summary (Last 24 hours) at 01/06/2020 0859 Last data filed at 01/06/2020 0828 Gross per 24 hour  Intake 340 ml  Output --  Net 340 ml   Filed Weights   01/04/20 2211 01/05/20 2107  Weight: 79.4 kg 80.8 kg     Data Reviewed:   CBC: Recent Labs  Lab 01/04/20 2222 01/05/20 0824 01/06/20 0135  WBC 4.5 6.8 5.8  NEUTROABS 3.0 4.0  --   HGB 15.6* 13.6 12.3  HCT 48.3* 42.9 38.8  MCV 80.0 81.4 81.2  PLT 97* PLATELET CLUMPS NOTED ON SMEAR, UNABLE TO ESTIMATE 81*   Basic Metabolic Panel: Recent Labs  Lab 01/04/20 2222 01/05/20 0824 01/06/20 0135  NA 137 139 135  K 2.4* 3.3* 3.1*  CL 93* 102 104  CO2 '28 24 22  ' GLUCOSE 135* 82 88  BUN <5* <5* <5*  CREATININE 0.90 0.70 0.59  CALCIUM 8.6* 7.9* 7.6*  MG 1.2* 1.6* 1.6*   GFR: Estimated Creatinine Clearance: 70.4 mL/min (by C-G formula based on SCr of 0.59 mg/dL). Liver Function Tests: Recent Labs  Lab 01/04/20 2222 01/05/20 0824 01/06/20 0135  AST 120* 98* 68*  ALT 70* 56* 47*  ALKPHOS 147* 119 94  BILITOT 2.5* 2.3* 1.6*  PROT 7.8 6.7 5.9*  ALBUMIN 3.2* 2.9* 2.6*   Recent Labs  Lab 01/04/20 2222  LIPASE  23   No results for input(s): AMMONIA in the last 168 hours. Coagulation Profile: Recent Labs  Lab 01/05/20 0823  INR 1.1   Cardiac Enzymes: Recent Labs  Lab 01/05/20 0824  CKTOTAL 155   BNP (last 3 results) No results for input(s): PROBNP in the last 8760 hours. HbA1C: No results for input(s): HGBA1C in the last 72 hours. CBG: No results for input(s): GLUCAP in the last 168 hours. Lipid Profile: No results for input(s): CHOL, HDL, LDLCALC, TRIG, CHOLHDL, LDLDIRECT in the last 72 hours. Thyroid Function Tests: No results for input(s): TSH, T4TOTAL, FREET4, T3FREE, THYROIDAB in the last 72 hours. Anemia Panel: No results for input(s): VITAMINB12, FOLATE, FERRITIN, TIBC, IRON, RETICCTPCT in the last 72 hours. Sepsis Labs: Recent Labs  Lab 01/04/20 2222 01/05/20 0015 01/05/20 0646  LATICACIDVEN 3.7* 2.5* 1.4    Recent Results (from the past 240 hour(s))  Blood culture (routine x 2)     Status: None (Preliminary result)   Collection Time: 01/05/20 12:31 AM   Specimen: BLOOD  Result Value Ref Range Status   Specimen Description BLOOD RIGHT FOREARM  Final   Special Requests   Final  BOTTLES DRAWN AEROBIC AND ANAEROBIC Blood Culture adequate volume   Culture  Setup Time   Final    GRAM POSITIVE COCCI Organism ID to follow CRITICAL RESULT CALLED TO, READ BACK BY AND VERIFIED WITH: T DANG PHARMD 1953 01/05/20 A BROWNING IN BOTH AEROBIC AND ANAEROBIC BOTTLES Performed at [facility], [place] [address] [place], [place] [address]    Culture GRAM POSITIVE COCCI  Final   Report Status PENDING  Incomplete  Blood Culture ID Panel (Reflexed)     Status: Abnormal   Collection Time: 01/05/20 12:31 AM  Result Value Ref Range Status   Enterococcus species NOT DETECTED NOT DETECTED Final   Listeria monocytogenes NOT DETECTED NOT DETECTED Final   Staphylococcus species NOT DETECTED NOT DETECTED Final   Staphylococcus aureus (BCID) NOT DETECTED NOT DETECTED Final    Streptococcus species DETECTED (A) NOT DETECTED Final    Comment: Not Enterococcus species, Streptococcus agalactiae, Streptococcus pyogenes, or Streptococcus pneumoniae. CRITICAL RESULT CALLED TO, READ BACK BY AND VERIFIED WITH: T DANG PHARMD 1953 01/05/20 A BROWNING    Streptococcus agalactiae NOT DETECTED NOT DETECTED Final   Streptococcus pneumoniae NOT DETECTED NOT DETECTED Final   Streptococcus pyogenes NOT DETECTED NOT DETECTED Final   Acinetobacter baumannii NOT DETECTED NOT DETECTED Final   Enterobacteriaceae species NOT DETECTED NOT DETECTED Final   Enterobacter cloacae complex NOT DETECTED NOT DETECTED Final   Escherichia coli NOT DETECTED NOT DETECTED Final   Klebsiella oxytoca NOT DETECTED NOT DETECTED Final   Klebsiella pneumoniae NOT DETECTED NOT DETECTED Final   Proteus species NOT DETECTED NOT DETECTED Final   Serratia marcescens NOT DETECTED NOT DETECTED Final   Haemophilus influenzae NOT DETECTED NOT DETECTED Final   Neisseria meningitidis NOT DETECTED NOT DETECTED Final   Pseudomonas aeruginosa NOT DETECTED NOT DETECTED Final   Candida albicans NOT DETECTED NOT DETECTED Final   Candida glabrata NOT DETECTED NOT DETECTED Final   Candida krusei NOT DETECTED NOT DETECTED Final   Candida parapsilosis NOT DETECTED NOT DETECTED Final   Candida tropicalis NOT DETECTED NOT DETECTED Final    Comment: Performed at [facility], [place]. [address], [place], [place] [address]  Blood culture (routine x 2)     Status: None (Preliminary result)   Collection Time: 01/05/20 12:59 AM   Specimen: BLOOD  Result Value Ref Range Status   Specimen Description BLOOD SITE NOT SPECIFIED  Final   Special Requests   Final    BOTTLES DRAWN AEROBIC ONLY Blood Culture results may not be optimal due to an inadequate volume of blood received in culture bottles   Culture   Final    NO GROWTH 1 DAY Performed at [facility], [place] [address], [place], [place] [address]    Report Status  PENDING  Incomplete  SARS Coronavirus 2 by RT PCR (hospital order, performed in [facility] hospital lab) Nasopharyngeal Nasopharyngeal Swab     Status: None   Collection Time: 01/05/20  3:02 AM   Specimen: Nasopharyngeal Swab  Result Value Ref Range Status   SARS Coronavirus 2 NEGATIVE NEGATIVE Final    Comment: (NOTE) SARS-CoV-2 target nucleic acids are NOT DETECTED. The SARS-CoV-2 RNA is generally detectable in upper and lower respiratory specimens during the acute phase of infection. The lowest concentration of SARS-CoV-2 viral copies this assay can detect is 250 copies / mL. A negative result does not preclude SARS-CoV-2 infection and should not be used as the sole basis for treatment or other patient  management decisions.  A negative result may occur with improper specimen collection / handling, submission of specimen other than nasopharyngeal swab, presence of viral mutation(s) within the areas targeted by this assay, and inadequate number of viral copies (<250 copies / mL). A negative result must be combined with clinical observations, patient history, and epidemiological information. Fact Sheet for Patients:   StrictlyIdeas.no Fact Sheet for Healthcare Providers: BankingDealers.co.za This test is not yet approved or cleared  by the [place] FDA and has been authorized for detection and/or diagnosis of SARS-CoV-2 by FDA under an Emergency Use Authorization (EUA).  This EUA will remain in effect (meaning this test can be used) for the duration of the COVID-19 declaration under Section 564(b)(1) of the Act, 21 U.S.C. section 360bbb-3(b)(1), unless the authorization is terminated or revoked sooner. Performed at [facility], [place] [address] [place], [place] [address]   C Difficile Quick Screen w PCR reflex     Status: None   Collection Time: 01/05/20  2:15 PM   Specimen: Stool  Result Value Ref Range Status   C Diff  antigen NEGATIVE NEGATIVE Final   C Diff toxin NEGATIVE NEGATIVE Final   C Diff interpretation No C. difficile detected.  Final    Comment: Performed at [facility], [place] [address], [place], [place] [address]  Culture, blood (routine x 2)     Status: None (Preliminary result)   Collection Time: 01/05/20  9:13 PM   Specimen: BLOOD LEFT HAND  Result Value Ref Range Status   Specimen Description BLOOD LEFT HAND  Final   Special Requests   Final    BOTTLES DRAWN AEROBIC ONLY Blood Culture results may not be optimal due to an inadequate volume of blood received in culture bottles   Culture   Final    NO GROWTH < 12 HOURS Performed at [facility], [place] [address] [place], [place] [address]    Report Status PENDING  Incomplete  Culture, blood (routine x 2)     Status: None (Preliminary result)   Collection Time: 01/05/20  9:24 PM   Specimen: BLOOD LEFT FOREARM  Result Value Ref Range Status   Specimen Description BLOOD LEFT FOREARM  Final   Special Requests   Final    BOTTLES DRAWN AEROBIC ONLY Blood Culture results may not be optimal due to an inadequate volume of blood received in culture bottles   Culture   Final    NO GROWTH < 12 HOURS Performed at [facility], [place] [address] [place], [place] [address]    Report Status PENDING  Incomplete         Radiology Studies: DG Ribs Unilateral W/Chest Left  Result Date: 01/05/2020 CLINICAL DATA:  [place], left lateral rib pain EXAM: LEFT RIBS AND CHEST - 3+ VIEW COMPARISON:  12/24/2018 FINDINGS: Frontal and oblique views of the left thoracic cage are obtained. Cardiac silhouette is unremarkable. No airspace disease, effusion, or pneumothorax. No acute displaced rib fractures. IMPRESSION: 1. No acute intrathoracic process. Electronically Signed   By: Randa Ngo M.D.   On: 01/05/2020 00:02   CT Head Wo Contrast  Result Date: 01/05/2020 CLINICAL DATA:  Dizziness, fall, headache, hypertension EXAM: CT HEAD WITHOUT  CONTRAST TECHNIQUE: Contiguous axial images were obtained from the base of the skull through the vertex without intravenous contrast. COMPARISON:  10/01/2018 FINDINGS: Brain: No acute infarct or hemorrhage. Lateral ventricles and midline structures are unremarkable. No acute extra-axial fluid collections. No mass  effect. Vascular: No hyperdense vessel or unexpected calcification. Skull: Normal. Negative for fracture or focal lesion. Sinuses/Orbits: No acute finding. Other: None. IMPRESSION: 1. Stable head CT, no acute process. Electronically Signed   By: Randa Ngo M.D.   On: 01/05/2020 00:09   CT ABDOMEN PELVIS W CONTRAST  Result Date: 01/05/2020 CLINICAL DATA:  Nausea, vomiting, and diarrhea. Generalized abdominal pain EXAM: CT ABDOMEN AND PELVIS WITH CONTRAST TECHNIQUE: Multidetector CT imaging of the abdomen and pelvis was performed using the standard protocol following bolus administration of intravenous contrast. CONTRAST:  181m OMNIPAQUE IOHEXOL 300 MG/ML  SOLN COMPARISON:  None. FINDINGS: Lower chest:  No contributory findings. Hepatobiliary: Hepatic steatosis.Cholecystectomy. Pancreas: Unremarkable. Spleen: Unremarkable. Adrenals/Urinary Tract: Negative adrenals. No hydronephrosis or stone. Undulating left more than right renal cortex attributed to mild patchy scarring. Unremarkable bladder when allowing for artifact from bilateral hip prosthesis. Stomach/Bowel: No obstruction or visible inflammation, including appendicitis. Vascular/Lymphatic: No acute vascular abnormality. Mild aortoiliac atherosclerosis. No mass or adenopathy. Reproductive:No pathologic findings. Other: No ascites or pneumoperitoneum. Fatty umbilical to infra umbilical hernia. Musculoskeletal: No acute abnormalities. Bilateral hip arthroplasty. L3-4 and L4-5 PLIF. There is adjacent segment L2-3 disc and facet degeneration with spinal stenosis. IMPRESSION: 1. No acute finding. 2. Hepatic steatosis. Electronically Signed   By:  JMonte FantasiaM.D.   On: 01/05/2020 05:12   ECHOCARDIOGRAM COMPLETE  Result Date: 01/05/2020    ECHOCARDIOGRAM REPORT   Patient Name:   Marissa HARADADate of Exam: 01/05/2020 Medical Rec #:  [id number]    Height:       59.0 in Accession #:    [id number]   Weight:       175.0 lb Date of Birth:  8August 08, 1962    BSA:          1.743 m Patient Age:    89years      BP:           100/79 mmHg Patient Gender: F             HR:           80 bpm. Exam Location:  Inpatient Procedure: 2D Echo, Cardiac Doppler and Color Doppler Indications:     Syncope 780.2/R55  History:         Patient has prior history of Echocardiogram examinations, most                  recent 11/28/2016. Arrythmias:Atrial Fibrillation; Risk                  Factors:Current Smoker and Hypertension.  Sonographer:     AClayton LefortRDCS (AE) Referring Phys:  [id number] GardenDiagnosing Phys: ADixie DialsMD IMPRESSIONS  1. Left ventricular ejection fraction, by estimation, is 55 to 60%. The left ventricle has normal function. The left ventricle has no regional wall motion abnormalities. There is moderate concentric left ventricular hypertrophy. Left ventricular diastolic parameters are consistent with Grade I diastolic dysfunction (impaired relaxation).  2. Right ventricular systolic function is normal. The right ventricular size is normal.  3. Left atrial size was mildly dilated.  4. The mitral valve is degenerative. Mild mitral valve regurgitation.  5. The aortic valve is tricuspid. Aortic valve regurgitation is not visualized. Mild aortic valve sclerosis is present, with no evidence of aortic valve stenosis.  6. The inferior vena cava is dilated in size with <50% respiratory variability, suggesting right atrial pressure of 15 mmHg. FINDINGS  Left Ventricle: Left  ventricular ejection fraction, by estimation, is 55 to 60%. The left ventricle has normal function. The left ventricle has no regional wall motion abnormalities. The left ventricular  internal cavity size was normal in size. There is  moderate concentric left ventricular hypertrophy. Left ventricular diastolic parameters are consistent with Grade I diastolic dysfunction (impaired relaxation). Right Ventricle: The right ventricular size is normal. No increase in right ventricular wall thickness. Right ventricular systolic function is normal. Left Atrium: Left atrial size was mildly dilated. Right Atrium: Right atrial size was normal in size. Pericardium: There is no evidence of pericardial effusion. Mitral Valve: The mitral valve is degenerative in appearance. Moderate mitral annular calcification. Mild mitral valve regurgitation. MV peak gradient, 3.2 mmHg. The mean mitral valve gradient is 1.0 mmHg. Tricuspid Valve: The tricuspid valve is normal in structure. Tricuspid valve regurgitation is mild. Aortic Valve: The aortic valve is tricuspid. Aortic valve regurgitation is not visualized. Mild aortic valve sclerosis is present, with no evidence of aortic valve stenosis. Aortic valve mean gradient measures 4.0 mmHg. Aortic valve peak gradient measures 6.4 mmHg. Aortic valve area, by VTI measures 3.18 cm. Pulmonic Valve: The pulmonic valve was normal in structure. Pulmonic valve regurgitation is not visualized. Aorta: The aortic root is normal in size and structure. Venous: The inferior vena cava is dilated in size with less than 50% respiratory variability, suggesting right atrial pressure of 15 mmHg. IAS/Shunts: No atrial level shunt detected by color flow Doppler.  LEFT VENTRICLE PLAX 2D LVIDd:         2.69 cm  Diastology LVIDs:         2.00 cm  LV e' lateral:   6.42 cm/s LV PW:         2.10 cm  LV E/e' lateral: 9.3 LV IVS:        1.80 cm  LV e' medial:    4.79 cm/s LVOT diam:     2.30 cm  LV E/e' medial:  12.4 LV SV:         85 LV SV Index:   49 LVOT Area:     4.15 cm  RIGHT VENTRICLE             IVC RV Basal diam:  3.20 cm     IVC diam: 2.22 cm RV S prime:     12.70 cm/s TAPSE (M-mode): 1.9  cm LEFT ATRIUM             Index       RIGHT ATRIUM           Index LA diam:        2.80 cm 1.61 cm/m  RA Area:     15.80 cm LA Vol (A2C):   60.2 ml 34.55 ml/m RA Volume:   40.20 ml  23.07 ml/m LA Vol (A4C):   47.4 ml 27.20 ml/m LA Biplane Vol: 54.4 ml 31.22 ml/m  AORTIC VALVE AV Area (Vmax):    3.16 cm AV Area (Vmean):   2.98 cm AV Area (VTI):     3.18 cm AV Vmax:           126.75 cm/s AV Vmean:          96.025 cm/s AV VTI:            0.267 m AV Peak Grad:      6.4 mmHg AV Mean Grad:      4.0 mmHg LVOT Vmax:         96.40 cm/s LVOT  Vmean:        68.900 cm/s LVOT VTI:          0.204 m LVOT/AV VTI ratio: 0.77  AORTA Ao Root diam: 3.00 cm Ao Asc diam:  3.30 cm MITRAL VALVE MV Area (PHT): 3.56 cm    SHUNTS MV Peak grad:  3.2 mmHg    Systemic VTI:  0.20 m MV Mean grad:  1.0 mmHg    Systemic Diam: 2.30 cm MV Vmax:       0.89 m/s MV Vmean:      55.1 cm/s MV Decel Time: 213 msec MV E velocity: 59.50 cm/s MV A velocity: 54.65 cm/s MV E/A ratio:  1.09 [name] MD Electronically signed by [name] MD Signature Date/Time: 01/05/2020/6:20:34 PM    Final    US Abdomen Limited RUQ  Result Date: 01/05/2020 CLINICAL DATA:  Elevated LFTs, palpable abdominal abnormality EXAM: ULTRASOUND ABDOMEN LIMITED RIGHT UPPER QUADRANT COMPARISON:  12/11/2013 FINDINGS: Gallbladder: Surgically absent Common bile duct: Diameter: 4 mm Liver: No focal lesion identified. Within normal limits in parenchymal echogenicity. Portal vein is patent on color Doppler imaging with normal direction of blood flow towards the liver. Other: Ill-defined 4.5 x 2.9 x 4.1 cm hypoechoic area is seen within the right anterior abdominal wall just lateral to the umbilicus, corresponding to the palpable abnormality. This likely reflects an umbilical hernia, with abdominal wall defect measuring 1.9 cm. CT may be useful if further evaluation is desired. IMPRESSION: 1. Probable periumbilical ventral hernia, corresponding to palpable abnormality. CT could  be performed for further characterization. 2. Cholecystectomy. 3. Otherwise unremarkable exam. Electronically Signed   By: Randa Ngo M.D.   On: 01/05/2020 02:31        Scheduled Meds: . amLODipine  5 mg Oral Daily  . enoxaparin (LOVENOX) injection  40 mg Subcutaneous Q24H  . metoprolol tartrate  25 mg Oral BID   Continuous Infusions: . cefTRIAXone (ROCEPHIN)  IV 2 g (01/05/20 2235)     LOS: 1 day   Time spent= 35 mins    Gitty Osterlund Arsenio Loader, MD [facility]  If 7PM-7AM, please contact night-coverage  01/06/2020, 8:59 AM

## 2020-01-06 NOTE — Progress Notes (Signed)
Patient requesting to take her Zanaflex. MD paged for orders

## 2020-01-06 NOTE — Progress Notes (Signed)
Patient refusing I.V. start at this time. RN will place another consult when daughter arrives.

## 2020-01-06 NOTE — Progress Notes (Addendum)
Patient keeps constantly getting out of bed. This nurse informed patient to call before getting out of bed. Patient noted to  arrange her things and stating  " I am going home". Patient kept talking however whatever she was saying did not make sense. At one point patient stated " this is my home and I have my cane in the stiting room". I constantly reoriented patient but she kept packing her belongings to leave. Patient also pulled out her IV and there was blood all over the floor. As I was cleaning the blood with wipes , patient took one of her clothings and wiped off the blood on the floor with her clothings. I called patient's daughter and and informed her of patient's onset confusion. Dr Myna Hidalgo paged made aware on changes in this patient.

## 2020-01-07 ENCOUNTER — Encounter (HOSPITAL_COMMUNITY): Payer: Self-pay | Admitting: Internal Medicine

## 2020-01-07 ENCOUNTER — Other Ambulatory Visit: Payer: Self-pay

## 2020-01-07 LAB — COMPREHENSIVE METABOLIC PANEL
ALT: 49 U/L — ABNORMAL HIGH (ref 0–44)
AST: 74 U/L — ABNORMAL HIGH (ref 15–41)
Albumin: 3.2 g/dL — ABNORMAL LOW (ref 3.5–5.0)
Alkaline Phosphatase: 115 U/L (ref 38–126)
Anion gap: 8 (ref 5–15)
BUN: <5 mg/dL — ABNORMAL LOW (ref 6–20)
CO2: 24 mmol/L (ref 22–32)
Calcium: 9 mg/dL (ref 8.9–10.3)
Chloride: 105 mmol/L (ref 98–111)
Creatinine, Ser: 0.72 mg/dL (ref 0.44–1.00)
GFR calc Af Amer: >60 mL/min (ref >60)
GFR calc non Af Amer: >60 mL/min (ref >60)
Glucose, Bld: 96 mg/dL (ref 70–99)
Potassium: 4.6 mmol/L (ref 3.5–5.1)
Sodium: 137 mmol/L (ref 135–145)
Total Bilirubin: 1.2 mg/dL (ref 0.3–1.2)
Total Protein: 6.9 g/dL (ref 6.5–8.1)

## 2020-01-07 LAB — CBC
HCT: 43.6 % (ref 36.0–46.0)
Hemoglobin: 13.6 g/dL (ref 12.0–15.0)
MCH: 25.5 pg — ABNORMAL LOW (ref 26.0–34.0)
MCHC: 31.2 g/dL (ref 30.0–36.0)
MCV: 81.8 fL (ref 80.0–100.0)
Platelets: 87 10*3/uL — ABNORMAL LOW (ref 150–400)
RBC: 5.33 MIL/uL — ABNORMAL HIGH (ref 3.87–5.11)
RDW: 20 % — ABNORMAL HIGH (ref 11.5–15.5)
WBC: 6.4 10*3/uL (ref 4.0–10.5)
nRBC: 0 % (ref 0.0–0.2)

## 2020-01-07 LAB — PROCALCITONIN: Procalcitonin: <0.1 ng/mL

## 2020-01-07 LAB — HEPATITIS PANEL, ACUTE
HCV Ab: <0.1 s/co ratio — AB (ref 0.0–0.9)
Hep A IgM: NEGATIVE — AB
Hep B C IgM: NEGATIVE — AB
Hepatitis B Surface Ag: NEGATIVE — AB

## 2020-01-07 LAB — FOLATE RBC
Folate, Hemolysate: 325 ng/mL
Folate, RBC: 779 ng/mL (ref >498)
Hematocrit: 41.7 % (ref 34.0–46.6)

## 2020-01-07 LAB — MAGNESIUM: Magnesium: 2 mg/dL (ref 1.7–2.4)

## 2020-01-07 MED ORDER — CHLORDIAZEPOXIDE HCL 25 MG PO CAPS
25.0000 mg | ORAL_CAPSULE | Freq: Three times a day (TID) | ORAL | Status: AC
  Administered 2020-01-07 (×3): 25 mg via ORAL
  Filled 2020-01-07 (×3): qty 1

## 2020-01-07 MED ORDER — THIAMINE HCL 100 MG/ML IJ SOLN
250.0000 mg | Freq: Two times a day (BID) | INTRAVENOUS | Status: AC
  Administered 2020-01-08 – 2020-01-09 (×3): 250 mg via INTRAVENOUS
  Filled 2020-01-07 (×3): qty 2.5

## 2020-01-07 MED ORDER — THIAMINE HCL 100 MG PO TABS
100.0000 mg | ORAL_TABLET | Freq: Three times a day (TID) | ORAL | Status: DC
  Administered 2020-01-10: 100 mg via ORAL
  Filled 2020-01-07: qty 1

## 2020-01-07 MED ORDER — CHLORDIAZEPOXIDE HCL 25 MG PO CAPS
25.0000 mg | ORAL_CAPSULE | Freq: Two times a day (BID) | ORAL | Status: AC
  Administered 2020-01-08 (×2): 25 mg via ORAL
  Filled 2020-01-07 (×2): qty 1

## 2020-01-07 MED ORDER — THIAMINE HCL 100 MG/ML IJ SOLN
500.0000 mg | Freq: Three times a day (TID) | INTRAVENOUS | Status: AC
  Administered 2020-01-07 – 2020-01-08 (×5): 500 mg via INTRAVENOUS
  Filled 2020-01-07 (×5): qty 5

## 2020-01-07 MED ORDER — CHLORDIAZEPOXIDE HCL 25 MG PO CAPS
25.0000 mg | ORAL_CAPSULE | Freq: Once | ORAL | Status: DC
  Filled 2020-01-07: qty 1

## 2020-01-07 NOTE — Progress Notes (Signed)
PROGRESS NOTE    Marissa Weber  [id number] DOB: 1960-11-05 DOA: 01/04/2020 PCP: [name], MD   Brief Narrative: 59 year old female with multiple comorbidities including PAF, asthma, hypertension, obesity with BMI 35.3 presents with 2-day history of intractable nausea, vomiting, diarrhea.  In the ED found to have lactic acidosis dehydration severe hypokalemia, severe hypomagnesemia.  CT imaging of the abdomen and urinalysis unremarkable.  Patient also had syncope on standing up on 5/10 and in the ED.  CT head no acute finding.  COVID-19 negative.  Admitted with suspicion of acute viral gastroenteritis causing severe dehydration and syncope.    Assessment & Plan:   Principal Problem:   Nausea vomiting and diarrhea Active Problems:   AF (paroxysmal atrial fibrillation) (HCC)   Acute gastroenteritis   Hypokalemia due to excessive gastrointestinal loss of potassium   Hypomagnesemia   SIRS (systemic inflammatory response syndrome) (HCC)   Orthostatic syncope   Abnormal LFTs   Lactic acidosis   Intractable nausea and vomiting  Nausea vomiting diarrhea likely secondary to acute viral gastroenteritis SIRS secondary to dehydration acute viral gastroenteritis -CT of the abdomen pelvis is negative for acute pathology.  UA-negative. -Supportive care, antiemetics. -Stool studies-negative -GI panel-negative -Blood cultures-Streptococcus orals  Streptococcus oralis bacteremia; infection vs contaminate.  Acute bronchitis with daily tobacco use Nicotine patch. IV Rocephin. Follow culture and sensitivity. Will repeat Cultures today  Scheduled and as needed bronchodilators.  Pulmicort. Low threshold to start prednisone if necessary.  Currently not wheezing or hypoxic. Supplemental oxygen as necessary. Procalcitonin negative  Acute metabolic encephalopathy, slightly better today. Concerns for Wernicke's encephalopathy Elevated ammonia, lactulose twice daily. Titrate to 3-4 soft bowel  movements daily CT head negative TSH normal, B12 normal Thiamine 500 mg IV 3 times daily x 5 days followed by 250 mg BID x 3 days followed by PO.   Daily alcohol use Alcohol withdrawal protocol. Folate pending. B12 normal Continue multivitamin.  Moderate to severe dehydration, intravascular volume loss Lactic acidosis/hypokalemia/hypomagnesemia -Aggressive hydration.  Aggressive repletion of electrolytes  Orthostatic syncope secondary to dehydration -Echocardiogram-EF 55 to 60%.  Moderate LVH, grade 1 DD. -IV fluids.  Transaminitis, improving -Right upper quadrant ultrasound-periumbilical ventral hernia.  Overall unremarkable.  History of paroxysmal atrial fibrillation -On metoprolol.  On daily aspirin.  Essential hypertension -Norvasc, metoprolol  History of asthma  -Currently not in exacerbation.   DVT prophylaxis: Lovenox Code Status: Full code Family Communication: Family at bedside  Status is: Inpatient  Remains inpatient appropriate because:Altered mental status   Dispo: The patient is from: Home              Anticipated d/c is to: Home              Anticipated d/c date is: 2 days              Patient currently is not medically stable to d/c. Patient is still confused oriented to name only. Currently getting high doses of IV thiamine. Concerns of Wernicke's encephalopathy.    Subjective: Had about 5 bowel movements last 24 hours.  This morning she is alert to name and place. Apparently she was trying to smoke cigarettes in the hospital.  Nurses were able to stop this.  Review of Systems Otherwise negative except as per HPI, including: General: Denies fever, chills, night sweats or unintended weight loss. Resp: Denies cough, wheezing, shortness of breath. Cardiac: Denies chest pain, palpitations, orthopnea, paroxysmal nocturnal dyspnea. GI: Denies abdominal pain, nausea, vomiting, diarrhea or constipation GU: Denies dysuria,  frequency, hesitancy or  incontinence MS: Denies muscle aches, joint pain or swelling Neuro: Denies headache, neurologic deficits (focal weakness, numbness, tingling), abnormal gait Psych: Denies anxiety, depression, SI/HI/AVH Skin: Denies new rashes or lesions ID: Denies sick contacts, exotic exposures, travel  Examination: Constitutional: Not in acute distress Respiratory: Clear to auscultation bilaterally Cardiovascular: Normal sinus rhythm, no rubs Abdomen: Nontender nondistended good bowel sounds Musculoskeletal: No edema noted Skin: No rashes seen Neurologic: CN 2-12 grossly intact.  And nonfocal Psychiatric: Poor judgment and insight.  Alert to name and place only.  Baseline awake alert oriented X3. Objective: Vitals:   01/06/20 2052 01/06/20 2137 01/06/20 2224 01/07/20 0633  BP: 127/84   (!) 142/98  Pulse: (!) 106  (!) 105 83  Resp: 16     Temp: 98.8 F (37.1 C)   98.5 F (36.9 C)  TempSrc: Oral   Oral  SpO2: 98% 96%  93%  Weight:      Height:        Intake/Output Summary (Last 24 hours) at 01/07/2020 0734 Last data filed at 01/07/2020 0018 Gross per 24 hour  Intake 1811.1 ml  Output --  Net 1811.1 ml   Filed Weights   01/04/20 2211 01/05/20 2107  Weight: 79.4 kg 80.8 kg     Data Reviewed:   CBC: Recent Labs  Lab 01/04/20 2222 01/05/20 0824 01/06/20 0135 01/07/20 0213  WBC 4.5 6.8 5.8 6.4  NEUTROABS 3.0 4.0  --   --   HGB 15.6* 13.6 12.3 13.6  HCT 48.3* 42.9 38.8 43.6  MCV 80.0 81.4 81.2 81.8  PLT 97* PLATELET CLUMPS NOTED ON SMEAR, UNABLE TO ESTIMATE 81* 87*   Basic Metabolic Panel: Recent Labs  Lab 01/04/20 2222 01/05/20 0824 01/06/20 0135 01/07/20 0213  NA 137 139 135 137  K 2.4* 3.3* 3.1* 4.6  CL 93* 102 104 105  CO2 28 24 22 24   GLUCOSE 135* 82 88 96  BUN <5* <5* <5* <5*  CREATININE 0.90 0.70 0.59 0.72  CALCIUM 8.6* 7.9* 7.6* 9.0  MG 1.2* 1.6* 1.6* 2.0   GFR: Estimated Creatinine Clearance: 70.4 mL/min (by C-G formula based on SCr of 0.72 mg/dL). Liver  Function Tests: Recent Labs  Lab 01/04/20 2222 01/05/20 0824 01/06/20 0135 01/07/20 0213  AST 120* 98* 68* 74*  ALT 70* 56* 47* 49*  ALKPHOS 147* 119 94 115  BILITOT 2.5* 2.3* 1.6* 1.2  PROT 7.8 6.7 5.9* 6.9  ALBUMIN 3.2* 2.9* 2.6* 3.2*   Recent Labs  Lab 01/04/20 2222  LIPASE 23   Recent Labs  Lab 01/06/20 1110  AMMONIA 51*   Coagulation Profile: Recent Labs  Lab 01/05/20 0823  INR 1.1   Cardiac Enzymes: Recent Labs  Lab 01/05/20 0824  CKTOTAL 155   BNP (last 3 results) No results for input(s): PROBNP in the last 8760 hours. HbA1C: No results for input(s): HGBA1C in the last 72 hours. CBG: No results for input(s): GLUCAP in the last 168 hours. Lipid Profile: No results for input(s): CHOL, HDL, LDLCALC, TRIG, CHOLHDL, LDLDIRECT in the last 72 hours. Thyroid Function Tests: Recent Labs    01/06/20 1110  TSH 1.649   Anemia Panel: Recent Labs    01/06/20 1636  VITAMINB12 611   Sepsis Labs: Recent Labs  Lab 01/04/20 2222 01/05/20 0015 01/05/20 0646 01/07/20 0213  PROCALCITON  --   --   --  <0.10  LATICACIDVEN 3.7* 2.5* 1.4  --     Recent Results (from the past 240  hour(s))  Blood culture (routine x 2)     Status: Abnormal (Preliminary result)   Collection Time: 01/05/20 12:31 AM   Specimen: BLOOD  Result Value Ref Range Status   Specimen Description BLOOD RIGHT FOREARM  Final   Special Requests   Final    BOTTLES DRAWN AEROBIC AND ANAEROBIC Blood Culture adequate volume   Culture  Setup Time   Final    GRAM POSITIVE COCCI CRITICAL RESULT CALLED TO, READ BACK BY AND VERIFIED WITH: T DANG PHARMD 1953 01/05/20 A BROWNING IN BOTH AEROBIC AND ANAEROBIC BOTTLES    Culture (A)  Final    STREPTOCOCCUS MITIS/ORALIS THE SIGNIFICANCE OF ISOLATING THIS ORGANISM FROM A SINGLE SET OF BLOOD CULTURES WHEN MULTIPLE SETS ARE DRAWN IS UNCERTAIN. PLEASE NOTIFY THE MICROBIOLOGY DEPARTMENT WITHIN ONE WEEK IF SPECIATION AND SENSITIVITIES ARE REQUIRED. Performed  at [facility], [place] [address] [place], [place] [address]    Report Status PENDING  Incomplete  Blood Culture ID Panel (Reflexed)     Status: Abnormal   Collection Time: 01/05/20 12:31 AM  Result Value Ref Range Status   Enterococcus species NOT DETECTED NOT DETECTED Final   Listeria monocytogenes NOT DETECTED NOT DETECTED Final   Staphylococcus species NOT DETECTED NOT DETECTED Final   Staphylococcus aureus (BCID) NOT DETECTED NOT DETECTED Final   Streptococcus species DETECTED (A) NOT DETECTED Final    Comment: Not Enterococcus species, Streptococcus agalactiae, Streptococcus pyogenes, or Streptococcus pneumoniae. CRITICAL RESULT CALLED TO, READ BACK BY AND VERIFIED WITH: T DANG PHARMD 1953 01/05/20 A BROWNING    Streptococcus agalactiae NOT DETECTED NOT DETECTED Final   Streptococcus pneumoniae NOT DETECTED NOT DETECTED Final   Streptococcus pyogenes NOT DETECTED NOT DETECTED Final   Acinetobacter baumannii NOT DETECTED NOT DETECTED Final   Enterobacteriaceae species NOT DETECTED NOT DETECTED Final   Enterobacter cloacae complex NOT DETECTED NOT DETECTED Final   Escherichia coli NOT DETECTED NOT DETECTED Final   Klebsiella oxytoca NOT DETECTED NOT DETECTED Final   Klebsiella pneumoniae NOT DETECTED NOT DETECTED Final   Proteus species NOT DETECTED NOT DETECTED Final   Serratia marcescens NOT DETECTED NOT DETECTED Final   Haemophilus influenzae NOT DETECTED NOT DETECTED Final   Neisseria meningitidis NOT DETECTED NOT DETECTED Final   Pseudomonas aeruginosa NOT DETECTED NOT DETECTED Final   Candida albicans NOT DETECTED NOT DETECTED Final   Candida glabrata NOT DETECTED NOT DETECTED Final   Candida krusei NOT DETECTED NOT DETECTED Final   Candida parapsilosis NOT DETECTED NOT DETECTED Final   Candida tropicalis NOT DETECTED NOT DETECTED Final    Comment: Performed at [facility], [place]. [address], [place], [place] [address]  Blood culture (routine x 2)     Status: None  (Preliminary result)   Collection Time: 01/05/20 12:59 AM   Specimen: BLOOD  Result Value Ref Range Status   Specimen Description BLOOD SITE NOT SPECIFIED  Final   Special Requests   Final    BOTTLES DRAWN AEROBIC ONLY Blood Culture results may not be optimal due to an inadequate volume of blood received in culture bottles   Culture   Final    NO GROWTH 1 DAY Performed at [facility], [place] [address], [place], [place] [address]    Report Status PENDING  Incomplete  SARS Coronavirus 2 by RT PCR (hospital order, performed in [facility] hospital lab) Nasopharyngeal Nasopharyngeal Swab     Status: None   Collection Time: 01/05/20  3:02 AM  Specimen: Nasopharyngeal Swab  Result Value Ref Range Status   SARS Coronavirus 2 NEGATIVE NEGATIVE Final    Comment: (NOTE) SARS-CoV-2 target nucleic acids are NOT DETECTED. The SARS-CoV-2 RNA is generally detectable in upper and lower respiratory specimens during the acute phase of infection. The lowest concentration of SARS-CoV-2 viral copies this assay can detect is 250 copies / mL. A negative result does not preclude SARS-CoV-2 infection and should not be used as the sole basis for treatment or other patient management decisions.  A negative result may occur with improper specimen collection / handling, submission of specimen other than nasopharyngeal swab, presence of viral mutation(s) within the areas targeted by this assay, and inadequate number of viral copies (<250 copies / mL). A negative result must be combined with clinical observations, patient history, and epidemiological information. Fact Sheet for Patients:   StrictlyIdeas.no Fact Sheet for Healthcare Providers: BankingDealers.co.za This test is not yet approved or cleared  by the [place] FDA and has been authorized for detection and/or diagnosis of SARS-CoV-2 by FDA under an Emergency Use Authorization (EUA).  This EUA  will remain in effect (meaning this test can be used) for the duration of the COVID-19 declaration under Section 564(b)(1) of the Act, 21 U.S.C. section 360bbb-3(b)(1), unless the authorization is terminated or revoked sooner. Performed at [facility], [place] [address] [place], [place] [address]   Gastrointestinal Panel by PCR , Stool     Status: None   Collection Time: 01/05/20  2:15 PM   Specimen: Stool  Result Value Ref Range Status   Campylobacter species NOT DETECTED NOT DETECTED Final   Plesimonas shigelloides NOT DETECTED NOT DETECTED Final   Salmonella species NOT DETECTED NOT DETECTED Final   Yersinia enterocolitica NOT DETECTED NOT DETECTED Final   Vibrio species NOT DETECTED NOT DETECTED Final   Vibrio cholerae NOT DETECTED NOT DETECTED Final   Enteroaggregative E coli (EAEC) NOT DETECTED NOT DETECTED Final   Enteropathogenic E coli (EPEC) NOT DETECTED NOT DETECTED Final   Enterotoxigenic E coli (ETEC) NOT DETECTED NOT DETECTED Final   Shiga like toxin producing E coli (STEC) NOT DETECTED NOT DETECTED Final   Shigella/Enteroinvasive E coli (EIEC) NOT DETECTED NOT DETECTED Final   Cryptosporidium NOT DETECTED NOT DETECTED Final   Cyclospora cayetanensis NOT DETECTED NOT DETECTED Final   Entamoeba histolytica NOT DETECTED NOT DETECTED Final   Giardia lamblia NOT DETECTED NOT DETECTED Final   Adenovirus F40/41 NOT DETECTED NOT DETECTED Final   Astrovirus NOT DETECTED NOT DETECTED Final   Norovirus GI/GII NOT DETECTED NOT DETECTED Final   Rotavirus A NOT DETECTED NOT DETECTED Final   Sapovirus (I, II, IV, and V) NOT DETECTED NOT DETECTED Final    Comment: Performed at [facility], [place] [place], [place] [address]  C Difficile Quick Screen w PCR reflex     Status: None   Collection Time: 01/05/20  2:15 PM   Specimen: Stool  Result Value Ref Range Status   C Diff antigen NEGATIVE NEGATIVE Final   C Diff toxin NEGATIVE NEGATIVE Final   C Diff  interpretation No C. difficile detected.  Final    Comment: Performed at [facility], [place] [address] [place], [place] [address]  Culture, blood (routine x 2)     Status: None (Preliminary result)   Collection Time: 01/05/20  9:13 PM   Specimen: BLOOD LEFT HAND  Result Value Ref Range Status   Specimen Description BLOOD LEFT HAND  Final   Special Requests   Final    BOTTLES DRAWN AEROBIC ONLY Blood Culture results may not be optimal due to an inadequate volume of blood received in culture bottles   Culture   Final    NO GROWTH < 12 HOURS Performed at [place] [address] [place], [place] [address]    Report Status PENDING  Incomplete  Culture, blood (routine x 2)     Status: None (Preliminary result)   Collection Time: 01/05/20  9:24 PM   Specimen: BLOOD LEFT FOREARM  Result Value Ref Range Status   Specimen Description BLOOD LEFT FOREARM  Final   Special Requests   Final    BOTTLES DRAWN AEROBIC ONLY Blood Culture results may not be optimal due to an inadequate volume of blood received in culture bottles   Culture   Final    NO GROWTH < 12 HOURS Performed at [facility], [place] [address], [place], [place] [address]    Report Status PENDING  Incomplete         Radiology Studies: ECHOCARDIOGRAM COMPLETE  Result Date: 01/05/2020    ECHOCARDIOGRAM REPORT   Patient Name:   ROSETTER LOCH Date of Exam: 01/05/2020 Medical Rec #:  [id number]     Height:       59.0 in Accession #:    [id number]    Weight:       175.0 lb Date of Birth:  30-Mar-1961     BSA:          1.743 m Patient Age:    88 years      BP:           100/79 mmHg Patient Gender: F             HR:           80 bpm. Exam Location:  Inpatient Procedure: 2D Echo, Cardiac Doppler and Color Doppler Indications:     Syncope 780.2/R55  History:         Patient has prior history of Echocardiogram examinations, most                  recent 11/28/2016. Arrythmias:Atrial Fibrillation; Risk                   Factors:Current Smoker and Hypertension.  Sonographer:     Clayton Lefort RDCS (AE) Referring Phys:  [id number] [place] Diagnosing Phys: [name] MD IMPRESSIONS  1. Left ventricular ejection fraction, by estimation, is 55 to 60%. The left ventricle has normal function. The left ventricle has no regional wall motion abnormalities. There is moderate concentric left ventricular hypertrophy. Left ventricular diastolic parameters are consistent with Grade I diastolic dysfunction (impaired relaxation).  2. Right ventricular systolic function is normal. The right ventricular size is normal.  3. Left atrial size was mildly dilated.  4. The mitral valve is degenerative. Mild mitral valve regurgitation.  5. The aortic valve is tricuspid. Aortic valve regurgitation is not visualized. Mild aortic valve sclerosis is present, with no evidence of aortic valve stenosis.  6. The inferior vena cava is dilated in size with <50% respiratory variability, suggesting right atrial pressure of 15 mmHg. FINDINGS  Left Ventricle: Left ventricular ejection fraction, by estimation, is 55 to 60%. The left ventricle has normal function. The left ventricle has no regional wall motion abnormalities. The left ventricular internal cavity size was normal in size. There is  moderate concentric left ventricular hypertrophy.  Left ventricular diastolic parameters are consistent with Grade I diastolic dysfunction (impaired relaxation). Right Ventricle: The right ventricular size is normal. No increase in right ventricular wall thickness. Right ventricular systolic function is normal. Left Atrium: Left atrial size was mildly dilated. Right Atrium: Right atrial size was normal in size. Pericardium: There is no evidence of pericardial effusion. Mitral Valve: The mitral valve is degenerative in appearance. Moderate mitral annular calcification. Mild mitral valve regurgitation. MV peak gradient, 3.2 mmHg. The mean mitral valve gradient is 1.0 mmHg.  Tricuspid Valve: The tricuspid valve is normal in structure. Tricuspid valve regurgitation is mild. Aortic Valve: The aortic valve is tricuspid. Aortic valve regurgitation is not visualized. Mild aortic valve sclerosis is present, with no evidence of aortic valve stenosis. Aortic valve mean gradient measures 4.0 mmHg. Aortic valve peak gradient measures 6.4 mmHg. Aortic valve area, by VTI measures 3.18 cm. Pulmonic Valve: The pulmonic valve was normal in structure. Pulmonic valve regurgitation is not visualized. Aorta: The aortic root is normal in size and structure. Venous: The inferior vena cava is dilated in size with less than 50% respiratory variability, suggesting right atrial pressure of 15 mmHg. IAS/Shunts: No atrial level shunt detected by color flow Doppler.  LEFT VENTRICLE PLAX 2D LVIDd:         2.69 cm  Diastology LVIDs:         2.00 cm  LV e' lateral:   6.42 cm/s LV PW:         2.10 cm  LV E/e' lateral: 9.3 LV IVS:        1.80 cm  LV e' medial:    4.79 cm/s LVOT diam:     2.30 cm  LV E/e' medial:  12.4 LV SV:         85 LV SV Index:   49 LVOT Area:     4.15 cm  RIGHT VENTRICLE             IVC RV Basal diam:  3.20 cm     IVC diam: 2.22 cm RV S prime:     12.70 cm/s TAPSE (M-mode): 1.9 cm LEFT ATRIUM             Index       RIGHT ATRIUM           Index LA diam:        2.80 cm 1.61 cm/m  RA Area:     15.80 cm LA Vol (A2C):   60.2 ml 34.55 ml/m RA Volume:   40.20 ml  23.07 ml/m LA Vol (A4C):   47.4 ml 27.20 ml/m LA Biplane Vol: 54.4 ml 31.22 ml/m  AORTIC VALVE AV Area (Vmax):    3.16 cm AV Area (Vmean):   2.98 cm AV Area (VTI):     3.18 cm AV Vmax:           126.75 cm/s AV Vmean:          96.025 cm/s AV VTI:            0.267 m AV Peak Grad:      6.4 mmHg AV Mean Grad:      4.0 mmHg LVOT Vmax:         96.40 cm/s LVOT Vmean:        68.900 cm/s LVOT VTI:          0.204 m LVOT/AV VTI ratio: 0.77  AORTA Ao Root diam: 3.00 cm Ao Asc diam:  3.30 cm MITRAL VALVE MV  Area (PHT): 3.56 cm    SHUNTS MV Peak  grad:  3.2 mmHg    Systemic VTI:  0.20 m MV Mean grad:  1.0 mmHg    Systemic Diam: 2.30 cm MV Vmax:       0.89 m/s MV Vmean:      55.1 cm/s MV Decel Time: 213 msec MV E velocity: 59.50 cm/s MV A velocity: 54.65 cm/s MV E/A ratio:  1.09 [name] MD Electronically signed by [name] MD Signature Date/Time: 01/05/2020/6:20:34 PM    Final         Scheduled Meds: . amLODipine  5 mg Oral Daily  . budesonide (PULMICORT) nebulizer solution  0.5 mg Nebulization BID  . chlordiazePOXIDE  25 mg Oral TID   Followed by  . [START ON 01/08/2020] chlordiazePOXIDE  25 mg Oral BID   Followed by  . [START ON 01/09/2020] chlordiazePOXIDE  25 mg Oral Once  . enoxaparin (LOVENOX) injection  40 mg Subcutaneous Q24H  . folic acid  1 mg Oral Daily  . lactulose  20 g Oral TID  . metoprolol tartrate  25 mg Oral BID  . multivitamin with minerals  1 tablet Oral Daily  . nicotine  14 mg Transdermal Daily  . [START ON 01/10/2020] thiamine  100 mg Oral TID   Continuous Infusions: . cefTRIAXone (ROCEPHIN)  IV Stopped (01/06/20 2251)  . lactated ringers 75 mL/hr at 01/07/20 0152  . thiamine injection     Followed by  . [START ON 01/08/2020] thiamine injection       LOS: 2 days   Time spent= 35 mins    Jillyn Stacey Arsenio Loader, MD [facility]  If 7PM-7AM, please contact night-coverage  01/07/2020, 7:34 AM

## 2020-01-07 NOTE — Plan of Care (Signed)

## 2020-01-07 NOTE — Progress Notes (Signed)
Physical Therapy Treatment Patient Details Name: Marissa Weber MRN: [id number] DOB: 08/18/1961 Today's Date: 01/07/2020    History of Present Illness 59 year old female presenting to ED on 01/05/20 with nausea and vomiting for 2 days after fall 01/04/20(syncope?). While in the waiting room at [facility] emergency department the patient again arose from a seated position experienced an episode of intense lightheadedness and again lost consciousness. CT imaging of the abdomen/pelvis and head unremarkable.  Pt with past medical history of paroxysmal atrial fibrillation, asthma, hypertension.    PT Comments    Pt demonstrates improvement with functional mobility and amb. Pt able to complete bed mobility independently and min guard for transfers, amb around room with RW. Pt encouraged to sit up in chair, but pt adamant to get back into bed.  Discussed changing d/c plan to home and pt agreed with request for no PT follow up. Will continue to follow acutely until d/c home.  Orthostatic BPs Sitting 108/79  Standing 94/66  Sitting after 1 min 104/72  After amb  112/92   Spo2 RA 97-99%   Follow Up Recommendations  No PT follow up     Equipment Recommendations  None recommended by PT    Recommendations for Other Services       Precautions / Restrictions Precautions Precautions: Fall Restrictions Weight Bearing Restrictions: No    Mobility  Bed Mobility Overal bed mobility: Independent             General bed mobility comments: Pt sitting EOB upon entry into room, Pt independent with bed mobility sit to supine, required assistance for lines due to decreased awareness  Transfers Overall transfer level: Needs assistance Equipment used: Rolling walker (2 wheeled);None Transfers: Sit to/from Stand Sit to Stand: Min guard         General transfer comment: Min guard for safety due to possibility of syncope(?). Sit to stand x4 throughout  session  Ambulation/Gait Ambulation/Gait assistance: Min guard Gait Distance (Feet): 42 Feet   Gait Pattern/deviations: Step-through pattern;Wide base of support;Decreased stride length     General Gait Details: Amb around bed with RW,chair to bed no AD. Min guard for safety due to possibility of syncope(?)   Stairs             Wheelchair Mobility    Modified Rankin (Stroke Patients Only)       Balance Overall balance assessment: No apparent balance deficits (not formally assessed)   Sitting balance-Leahy Scale: Normal     Standing balance support: Bilateral upper extremity supported;No upper extremity supported;During functional activity Standing balance-Leahy Scale: Good Standing balance comment: Pt able to amb with and out DME,  but required min guard for safety and stability                            Cognition Arousal/Alertness: Awake/alert Behavior During Therapy: WFL for tasks assessed/performed Overall Cognitive Status: No family/caregiver present to determine baseline cognitive functioning Area of Impairment: Attention;Following commands;Safety/judgement;Awareness;Problem solving                   Current Attention Level: Sustained   Following Commands: Follows multi-step commands with increased time Safety/Judgement: Decreased awareness of safety;Decreased awareness of deficits   Problem Solving: Requires verbal cues General Comments: Pt required verbal cues for saftey to wait for assistance before getting up.      Exercises Total Joint Exercises Marching in Standing: 20 reps;Standing;AROM;Both    General Comments  General comments (skin integrity, edema, etc.): Pt reports she lives with adult children who could help her at home if work schedule permits, pt indicates fiancee would not be able to help upon d/c. Pt reports she would like to go home upon d/c with no therapy to follow up.      Pertinent Vitals/Pain Pain Assessment:  0-10 Pain Score: 10-Worst pain ever Pain Location: right shoulder and lt side from fall at home Pain Descriptors / Indicators: Aching;Grimacing;Guarding;Discomfort Pain Intervention(s): Limited activity within patient's tolerance;Monitored during session    Home Living                      Prior Function            PT Goals (current goals can now be found in the care plan section) Acute Rehab PT Goals Patient Stated Goal: home PT Goal Formulation: With patient Time For Goal Achievement: 01/21/20 Potential to Achieve Goals: Good Progress towards PT goals: Progressing toward goals    Frequency    Min 3X/week      PT Plan Discharge plan needs to be updated    Co-evaluation              AM-PAC PT "6 Clicks" Mobility   Outcome Measure  Help needed turning from your back to your side while in a flat bed without using bedrails?: None Help needed moving from lying on your back to sitting on the side of a flat bed without using bedrails?: None Help needed moving to and from a bed to a chair (including a wheelchair)?: None Help needed standing up from a chair using your arms (e.g., wheelchair or bedside chair)?: None Help needed to walk in hospital room?: None Help needed climbing 3-5 steps with a railing? : A Little 6 Click Score: 23    End of Session Equipment Utilized During Treatment: Gait belt Activity Tolerance: Patient tolerated treatment well Patient left: with call bell/phone within reach;in bed;with bed alarm set Nurse Communication: Mobility status PT Visit Diagnosis: Unsteadiness on feet (R26.81);Muscle weakness (generalized) (M62.81);Pain     Time: [id number] PT Time Calculation (min) (ACUTE ONLY): 29 min  Charges:  $Gait Training: 8-22 mins $Therapeutic Activity: 8-22 mins                     [employer] SPT 01/07/2020    Rolland Porter 01/07/2020, 3:29 PM

## 2020-01-07 NOTE — Plan of Care (Signed)

## 2020-01-08 LAB — CBC
HCT: 36.8 % (ref 36.0–46.0)
Hemoglobin: 11.4 g/dL — ABNORMAL LOW (ref 12.0–15.0)
MCH: 25.6 pg — ABNORMAL LOW (ref 26.0–34.0)
MCHC: 31 g/dL (ref 30.0–36.0)
MCV: 82.7 fL (ref 80.0–100.0)
Platelets: 110 10*3/uL — ABNORMAL LOW (ref 150–400)
RBC: 4.45 MIL/uL (ref 3.87–5.11)
RDW: 19.9 % — ABNORMAL HIGH (ref 11.5–15.5)
WBC: 5.9 10*3/uL (ref 4.0–10.5)
nRBC: 0.5 % — ABNORMAL HIGH (ref 0.0–0.2)

## 2020-01-08 LAB — CULTURE, BLOOD (ROUTINE X 2): Special Requests: ADEQUATE

## 2020-01-08 LAB — COMPREHENSIVE METABOLIC PANEL
ALT: 39 U/L (ref 0–44)
AST: 53 U/L — ABNORMAL HIGH (ref 15–41)
Albumin: 2.7 g/dL — ABNORMAL LOW (ref 3.5–5.0)
Alkaline Phosphatase: 89 U/L (ref 38–126)
Anion gap: 8 (ref 5–15)
BUN: <5 mg/dL — ABNORMAL LOW (ref 6–20)
CO2: 25 mmol/L (ref 22–32)
Calcium: 9.2 mg/dL (ref 8.9–10.3)
Chloride: 107 mmol/L (ref 98–111)
Creatinine, Ser: 0.83 mg/dL (ref 0.44–1.00)
GFR calc Af Amer: >60 mL/min (ref >60)
GFR calc non Af Amer: >60 mL/min (ref >60)
Glucose, Bld: 109 mg/dL — ABNORMAL HIGH (ref 70–99)
Potassium: 3.3 mmol/L — ABNORMAL LOW (ref 3.5–5.1)
Sodium: 140 mmol/L (ref 135–145)
Total Bilirubin: 0.9 mg/dL (ref 0.3–1.2)
Total Protein: 5.9 g/dL — ABNORMAL LOW (ref 6.5–8.1)

## 2020-01-08 LAB — MAGNESIUM: Magnesium: 1.4 mg/dL — ABNORMAL LOW (ref 1.7–2.4)

## 2020-01-08 LAB — AMMONIA: Ammonia: 35 umol/L (ref 9–35)

## 2020-01-08 MED ORDER — SODIUM CHLORIDE 0.9 % IV SOLN: INTRAVENOUS | Status: AC

## 2020-01-08 NOTE — Progress Notes (Signed)
Physical Therapy Treatment Patient Details Name: Marissa Weber MRN: [id number] DOB: Dec 16, 1960 Today's Date: 01/08/2020    History of Present Illness 59 year old female presenting to ED on 01/05/20 with nausea and vomiting for 2 days after fall 01/04/20(syncope?). While in the waiting room at [facility] emergency department the patient again arose from a seated position experienced an episode of intense lightheadedness and again lost consciousness. CT imaging of the abdomen/pelvis and head unremarkable.  Pt with past medical history of paroxysmal atrial fibrillation, asthma, hypertension.    PT Comments    Pt demonstrates improvement requiring supervision with transfers and functional mobility. Pt able to amb with supervision increased distance with cane and able to negotiate 4 steps min guard with L rail support and cane. Pt also able to complete perineal hygiene and transfer bedside commode to bed supervision. Pt required minimal verbal cues for safety to prevent getting up without assistance during the session. Will continue to follow acutely until d/c home.   Follow Up Recommendations  No PT follow up     Equipment Recommendations  None recommended by PT    Recommendations for Other Services       Precautions / Restrictions Precautions Precautions: Fall Restrictions Weight Bearing Restrictions: No    Mobility  Bed Mobility Overal bed mobility: Independent             General bed mobility comments: Pt sitting EOB upon entry into room  Transfers Overall transfer level: Needs assistance Equipment used: Rolling walker (2 wheeled);None Transfers: Sit to/from Stand Sit to Stand: Supervision            Ambulation/Gait Ambulation/Gait assistance: Supervision Gait Distance (Feet): 160 Feet Assistive device: Straight cane Gait Pattern/deviations: Step-through pattern;Wide base of support;Decreased stride length Gait velocity: decreased   General Gait Details:  Amb down hall with cane and supervision assistance. Pt had cane in hand, but did not use it.   Stairs Stairs: Yes Stairs assistance: Min guard Stair Management: One rail Left;Step to pattern;With cane Number of Stairs: 4     Wheelchair Mobility    Modified Rankin (Stroke Patients Only)       Balance Overall balance assessment: No apparent balance deficits (not formally assessed)   Sitting balance-Leahy Scale: Normal     Standing balance support: Single extremity supported;No upper extremity supported;During functional activity Standing balance-Leahy Scale: Good Standing balance comment: Pt able to amb with and out DME. Pt able to sit to stand from bedside commode, perform perineal hygine and transfer bed supervison without and assistive device.                            Cognition Arousal/Alertness: Awake/alert Behavior During Therapy: WFL for tasks assessed/performed Overall Cognitive Status: No family/caregiver present to determine baseline cognitive functioning Area of Impairment: Attention;Following commands;Safety/judgement;Awareness;Problem solving                   Current Attention Level: Sustained   Following Commands: Follows multi-step commands with increased time Safety/Judgement: Decreased awareness of safety;Decreased awareness of deficits   Problem Solving: Requires verbal cues General Comments: Pt continued to required verbal cues for saftey to wait for assistance before getting up.      Exercises      General Comments General comments (skin integrity, edema, etc.): Pt reported slight dizziness with sit to stand that resolved mid session.      Pertinent Vitals/Pain Pain Assessment: 0-10 Pain Score: 8  Pain Location: right shoulder and lt side from fall at home Pain Descriptors / Indicators: Guarding;Discomfort Pain Intervention(s): Limited activity within patient's tolerance;Monitored during session    Home Living                       Prior Function            PT Goals (current goals can now be found in the care plan section) Acute Rehab PT Goals Patient Stated Goal: home PT Goal Formulation: With patient Time For Goal Achievement: 01/21/20 Potential to Achieve Goals: Good Progress towards PT goals: Progressing toward goals    Frequency    Min 3X/week      PT Plan Current plan remains appropriate    Co-evaluation              AM-PAC PT "6 Clicks" Mobility   Outcome Measure  Help needed turning from your back to your side while in a flat bed without using bedrails?: None Help needed moving from lying on your back to sitting on the side of a flat bed without using bedrails?: None Help needed moving to and from a bed to a chair (including a wheelchair)?: None Help needed standing up from a chair using your arms (e.g., wheelchair or bedside chair)?: None Help needed to walk in hospital room?: None Help needed climbing 3-5 steps with a railing? : A Little 6 Click Score: 23    End of Session Equipment Utilized During Treatment: Gait belt Activity Tolerance: Patient tolerated treatment well Patient left: with call bell/phone within reach;in bed;with bed alarm set;Other (comment)(sitting EOB with breakfast) Nurse Communication: Mobility status PT Visit Diagnosis: Unsteadiness on feet (R26.81);Muscle weakness (generalized) (M62.81);Pain     Time: [id number] PT Time Calculation (min) (ACUTE ONLY): 15 min  Charges:  $Gait Training: 8-22 mins                     [employer] SPT 01/08/2020    Rolland Porter 01/08/2020, 1:19 PM

## 2020-01-08 NOTE — Plan of Care (Signed)
  Problem: Education: Goal: Knowledge of General Education information will improve Description Including pain rating scale, medication(s)/side effects and non-pharmacologic comfort measures Outcome: Progressing   

## 2020-01-08 NOTE — Progress Notes (Signed)
PROGRESS NOTE    Marissa Weber  [id number] DOB: 02-Jun-1961 DOA: 01/04/2020 PCP: [name], MD   Brief Narrative: 59 year old female with multiple comorbidities including PAF, asthma, hypertension, obesity with BMI 35.3 presents with 2-day history of intractable nausea, vomiting, diarrhea.  In the ED found to have lactic acidosis dehydration severe hypokalemia, severe hypomagnesemia.  CT imaging of the abdomen and urinalysis unremarkable.  Patient also had syncope on standing up on 5/10 and in the ED.  CT head no acute finding.  COVID-19 negative.  Admitted with suspicion of acute viral gastroenteritis causing severe dehydration and syncope.  Given alcohol use there is concerns of Wernicke's encephalopathy due to confusion abnormal gait therefore started on high-dose thiamine.    Assessment & Plan:   Principal Problem:   Nausea vomiting and diarrhea Active Problems:   AF (paroxysmal atrial fibrillation) (HCC)   Acute gastroenteritis   Hypokalemia due to excessive gastrointestinal loss of potassium   Hypomagnesemia   SIRS (systemic inflammatory response syndrome) (HCC)   Orthostatic syncope   Abnormal LFTs   Lactic acidosis   Intractable nausea and vomiting  Nausea vomiting diarrhea likely secondary to acute viral gastroenteritis SIRS secondary to dehydration acute viral gastroenteritis -CT of the abdomen pelvis is negative for acute pathology.  UA-negative. -Supportive care, antiemetics. -Stool studies-negative -GI panel-negative -Blood cultures-Streptococcus orals-likely contaminant., repeat cultures ordered.  Streptococcus oralis bacteremia; infection vs contaminate.  Acute bronchitis with daily tobacco use Nicotine patch, IV Rocephin.  Repeat cultures ordered.   Scheduled and as needed bronchodilators.  Pulmicort. Low threshold to start prednisone if necessary.  Currently not wheezing or hypoxic. Supplemental oxygen as necessary. Procalcitonin negative  Acute  metabolic encephalopathy, improved Abnormal gait Concerns for Wernicke's encephalopathy-altered mental status and abnormal gait Elevated ammonia, lactulose twice daily. Titrate to 3-4 soft bowel movements daily CT head negative TSH normal, B12 normal Thiamine 500 mg IV 3 times daily x 5 days followed by 250 mg BID x 3 days followed by PO.  Will need PT reeval today.  Nursing staff concerned about her gait  Daily alcohol use Alcohol withdrawal protocol. Folate pending. B12 normal Continue multivitamin.  Moderate to severe dehydration, intravascular volume loss Lactic acidosis/hypokalemia/hypomagnesemia -Aggressive hydration.  Aggressive repletion of electrolytes  Orthostatic syncope secondary to dehydration -Echocardiogram-EF 55 to 60%.  Moderate LVH, grade 1 DD. -IV fluids.  Transaminitis, improving -Right upper quadrant ultrasound-periumbilical ventral hernia.  Overall unremarkable.  History of paroxysmal atrial fibrillation -On metoprolol.  On daily aspirin.  Essential hypertension -Norvasc, metoprolol  History of asthma  -Currently not in exacerbation.  DVT prophylaxis: Lovenox Code Status: Full code Family Communication: Family at bedside  Status is: Inpatient  Remains inpatient appropriate because:Altered mental status   Dispo: The patient is from: Home              Anticipated d/c is to: Home              Anticipated d/c date is: 2 days              Patient currently is not medically stable to d/c. Patient is unstable to go home as patient is getting repeat PT eval today due to unsteady gait, continues to require IV thiamine, aggressive bronchodilators, fluids due to dizziness and dehydration. Hopefully discharge tomorrow.    Subjective: Mentally doing slightly better this morning she is alert awake oriented to name and place. Able to recall more events. She still quite unsteady on her gait per nursing staff. Patient was  reporting of dizziness.  Review of  Systems Otherwise negative except as per HPI, including: General: Denies fever, chills, night sweats or unintended weight loss. Resp: Denies cough, wheezing, shortness of breath. Cardiac: Denies chest pain, palpitations, orthopnea, paroxysmal nocturnal dyspnea. GI: Denies abdominal pain, nausea, vomiting, diarrhea or constipation GU: Denies dysuria, frequency, hesitancy or incontinence MS: Denies muscle aches, joint pain or swelling Neuro: Denies headache, neurologic deficits (focal weakness, numbness, tingling), abnormal gait Psych: Denies anxiety, depression, SI/HI/AVH Skin: Denies new rashes or lesions ID: Denies sick contacts, exotic exposures, travel Examination: Constitutional: Not in acute distress Respiratory: Clear to auscultation bilaterally Cardiovascular: Normal sinus rhythm, no rubs Abdomen: Nontender nondistended good bowel sounds Musculoskeletal: No edema noted Skin: No rashes seen Neurologic: CN 2-12 grossly intact.  And nonfocal Psychiatric: Poor judgment and insight. Alert and in place. Objective: Vitals:   01/07/20 1448 01/07/20 1952 01/07/20 2312 01/08/20 0545  BP: (!) 112/92  (!) 134/93 124/73  Pulse: 91 93 (!) 106 92  Resp: 18 18 18 16   Temp:   99.9 F (37.7 C) 98.7 F (37.1 C)  TempSrc:   Oral Oral  SpO2: 99% 96% 95% 96%  Weight:      Height:        Intake/Output Summary (Last 24 hours) at 01/08/2020 0833 Last data filed at 01/08/2020 0546 Gross per 24 hour  Intake 1290.28 ml  Output --  Net 1290.28 ml   Filed Weights   01/04/20 2211 01/05/20 2107  Weight: 79.4 kg 80.8 kg     Data Reviewed:   CBC: Recent Labs  Lab 01/04/20 2222 01/04/20 2222 01/05/20 0824 01/06/20 0135 01/06/20 1636 01/07/20 0213 01/08/20 0726  WBC 4.5  --  6.8 5.8  --  6.4 5.9  NEUTROABS 3.0  --  4.0  --   --   --   --   HGB 15.6*  --  13.6 12.3  --  13.6 11.4*  HCT 48.3*   < > 42.9 38.8 41.7 43.6 36.8  MCV 80.0  --  81.4 81.2  --  81.8 82.7  PLT 97*  --   PLATELET CLUMPS NOTED ON SMEAR, UNABLE TO ESTIMATE 81*  --  87* 110*   < > = values in this interval not displayed.   Basic Metabolic Panel: Recent Labs  Lab 01/04/20 2222 01/05/20 0824 01/06/20 0135 01/07/20 0213  NA 137 139 135 137  K 2.4* 3.3* 3.1* 4.6  CL 93* 102 104 105  CO2 28 24 22 24   GLUCOSE 135* 82 88 96  BUN <5* <5* <5* <5*  CREATININE 0.90 0.70 0.59 0.72  CALCIUM 8.6* 7.9* 7.6* 9.0  MG 1.2* 1.6* 1.6* 2.0   GFR: Estimated Creatinine Clearance: 70.4 mL/min (by C-G formula based on SCr of 0.72 mg/dL). Liver Function Tests: Recent Labs  Lab 01/04/20 2222 01/05/20 0824 01/06/20 0135 01/07/20 0213  AST 120* 98* 68* 74*  ALT 70* 56* 47* 49*  ALKPHOS 147* 119 94 115  BILITOT 2.5* 2.3* 1.6* 1.2  PROT 7.8 6.7 5.9* 6.9  ALBUMIN 3.2* 2.9* 2.6* 3.2*   Recent Labs  Lab 01/04/20 2222  LIPASE 23   Recent Labs  Lab 01/06/20 1110 01/08/20 0726  AMMONIA 51* 35   Coagulation Profile: Recent Labs  Lab 01/05/20 0823  INR 1.1   Cardiac Enzymes: Recent Labs  Lab 01/05/20 0824  CKTOTAL 155   BNP (last 3 results) No results for input(s): PROBNP in the last 8760 hours. HbA1C: No results for input(s): HGBA1C  in the last 72 hours. CBG: No results for input(s): GLUCAP in the last 168 hours. Lipid Profile: No results for input(s): CHOL, HDL, LDLCALC, TRIG, CHOLHDL, LDLDIRECT in the last 72 hours. Thyroid Function Tests: Recent Labs    01/06/20 1110  TSH 1.649   Anemia Panel: Recent Labs    01/06/20 1636  VITAMINB12 611   Sepsis Labs: Recent Labs  Lab 01/04/20 2222 01/05/20 0015 01/05/20 0646 01/07/20 0213  PROCALCITON  --   --   --  <0.10  LATICACIDVEN 3.7* 2.5* 1.4  --     Recent Results (from the past 240 hour(s))  Blood culture (routine x 2)     Status: Abnormal   Collection Time: 01/05/20 12:31 AM   Specimen: BLOOD  Result Value Ref Range Status   Specimen Description BLOOD RIGHT FOREARM  Final   Special Requests   Final    BOTTLES  DRAWN AEROBIC AND ANAEROBIC Blood Culture adequate volume   Culture  Setup Time   Final    GRAM POSITIVE COCCI CRITICAL RESULT CALLED TO, READ BACK BY AND VERIFIED WITH: T DANG PHARMD 1953 01/05/20 A BROWNING IN BOTH AEROBIC AND ANAEROBIC BOTTLES Performed at [facility], [place] [address], [place], [place] [address]    Culture STREPTOCOCCUS MITIS/ORALIS (A)  Final   Report Status 01/08/2020 FINAL  Final   Organism ID, Bacteria STREPTOCOCCUS MITIS/ORALIS  Final      Susceptibility   Streptococcus mitis/oralis - MIC*    PENICILLIN 1 INTERMEDIATE Intermediate     CEFTRIAXONE 1 SENSITIVE Sensitive     ERYTHROMYCIN 0.5 INTERMEDIATE Intermediate     LEVOFLOXACIN 2 SENSITIVE Sensitive     VANCOMYCIN 0.5 SENSITIVE Sensitive     * STREPTOCOCCUS MITIS/ORALIS  Blood Culture ID Panel (Reflexed)     Status: Abnormal   Collection Time: 01/05/20 12:31 AM  Result Value Ref Range Status   Enterococcus species NOT DETECTED NOT DETECTED Final   Listeria monocytogenes NOT DETECTED NOT DETECTED Final   Staphylococcus species NOT DETECTED NOT DETECTED Final   Staphylococcus aureus (BCID) NOT DETECTED NOT DETECTED Final   Streptococcus species DETECTED (A) NOT DETECTED Final    Comment: Not Enterococcus species, Streptococcus agalactiae, Streptococcus pyogenes, or Streptococcus pneumoniae. CRITICAL RESULT CALLED TO, READ BACK BY AND VERIFIED WITH: T DANG PHARMD 1953 01/05/20 A BROWNING    Streptococcus agalactiae NOT DETECTED NOT DETECTED Final   Streptococcus pneumoniae NOT DETECTED NOT DETECTED Final   Streptococcus pyogenes NOT DETECTED NOT DETECTED Final   Acinetobacter baumannii NOT DETECTED NOT DETECTED Final   Enterobacteriaceae species NOT DETECTED NOT DETECTED Final   Enterobacter cloacae complex NOT DETECTED NOT DETECTED Final   Escherichia coli NOT DETECTED NOT DETECTED Final   Klebsiella oxytoca NOT DETECTED NOT DETECTED Final   Klebsiella pneumoniae NOT DETECTED NOT DETECTED Final    Proteus species NOT DETECTED NOT DETECTED Final   Serratia marcescens NOT DETECTED NOT DETECTED Final   Haemophilus influenzae NOT DETECTED NOT DETECTED Final   Neisseria meningitidis NOT DETECTED NOT DETECTED Final   Pseudomonas aeruginosa NOT DETECTED NOT DETECTED Final   Candida albicans NOT DETECTED NOT DETECTED Final   Candida glabrata NOT DETECTED NOT DETECTED Final   Candida krusei NOT DETECTED NOT DETECTED Final   Candida parapsilosis NOT DETECTED NOT DETECTED Final   Candida tropicalis NOT DETECTED NOT DETECTED Final    Comment: Performed at [facility], [place]. [address], [place], [place] [address]  Blood culture (routine x 2)  Status: None (Preliminary result)   Collection Time: 01/05/20 12:59 AM   Specimen: BLOOD  Result Value Ref Range Status   Specimen Description BLOOD SITE NOT SPECIFIED  Final   Special Requests   Final    BOTTLES DRAWN AEROBIC ONLY Blood Culture results may not be optimal due to an inadequate volume of blood received in culture bottles   Culture   Final    NO GROWTH 2 DAYS Performed at [facility], [place] [address] [place], [place] [address]    Report Status PENDING  Incomplete  SARS Coronavirus 2 by RT PCR (hospital order, performed in [facility] hospital lab) Nasopharyngeal Nasopharyngeal Swab     Status: None   Collection Time: 01/05/20  3:02 AM   Specimen: Nasopharyngeal Swab  Result Value Ref Range Status   SARS Coronavirus 2 NEGATIVE NEGATIVE Final    Comment: (NOTE) SARS-CoV-2 target nucleic acids are NOT DETECTED. The SARS-CoV-2 RNA is generally detectable in upper and lower respiratory specimens during the acute phase of infection. The lowest concentration of SARS-CoV-2 viral copies this assay can detect is 250 copies / mL. A negative result does not preclude SARS-CoV-2 infection and should not be used as the sole basis for treatment or other patient management decisions.  A negative result may occur with improper specimen  collection / handling, submission of specimen other than nasopharyngeal swab, presence of viral mutation(s) within the areas targeted by this assay, and inadequate number of viral copies (<250 copies / mL). A negative result must be combined with clinical observations, patient history, and epidemiological information. Fact Sheet for Patients:   StrictlyIdeas.no Fact Sheet for Healthcare Providers: BankingDealers.co.za This test is not yet approved or cleared  by the [place] FDA and has been authorized for detection and/or diagnosis of SARS-CoV-2 by FDA under an Emergency Use Authorization (EUA).  This EUA will remain in effect (meaning this test can be used) for the duration of the COVID-19 declaration under Section 564(b)(1) of the Act, 21 U.S.C. section 360bbb-3(b)(1), unless the authorization is terminated or revoked sooner. Performed at [facility], [place] [address] [place], [place] [address]   Gastrointestinal Panel by PCR , Stool     Status: None   Collection Time: 01/05/20  2:15 PM   Specimen: Stool  Result Value Ref Range Status   Campylobacter species NOT DETECTED NOT DETECTED Final   Plesimonas shigelloides NOT DETECTED NOT DETECTED Final   Salmonella species NOT DETECTED NOT DETECTED Final   Yersinia enterocolitica NOT DETECTED NOT DETECTED Final   Vibrio species NOT DETECTED NOT DETECTED Final   Vibrio cholerae NOT DETECTED NOT DETECTED Final   Enteroaggregative E coli (EAEC) NOT DETECTED NOT DETECTED Final   Enteropathogenic E coli (EPEC) NOT DETECTED NOT DETECTED Final   Enterotoxigenic E coli (ETEC) NOT DETECTED NOT DETECTED Final   Shiga like toxin producing E coli (STEC) NOT DETECTED NOT DETECTED Final   Shigella/Enteroinvasive E coli (EIEC) NOT DETECTED NOT DETECTED Final   Cryptosporidium NOT DETECTED NOT DETECTED Final   Cyclospora cayetanensis NOT DETECTED NOT DETECTED Final   Entamoeba histolytica NOT  DETECTED NOT DETECTED Final   Giardia lamblia NOT DETECTED NOT DETECTED Final   Adenovirus F40/41 NOT DETECTED NOT DETECTED Final   Astrovirus NOT DETECTED NOT DETECTED Final   Norovirus GI/GII NOT DETECTED NOT DETECTED Final   Rotavirus A NOT DETECTED NOT DETECTED Final   Sapovirus (I, II, IV, and V) NOT DETECTED NOT DETECTED Final  Comment: Performed at [facility], [place], [place] [address]  C Difficile Quick Screen w PCR reflex     Status: None   Collection Time: 01/05/20  2:15 PM   Specimen: Stool  Result Value Ref Range Status   C Diff antigen NEGATIVE NEGATIVE Final   C Diff toxin NEGATIVE NEGATIVE Final   C Diff interpretation No C. difficile detected.  Final    Comment: Performed at [facility], [place] [address], [place], [place] [address]  Culture, blood (routine x 2)     Status: None (Preliminary result)   Collection Time: 01/05/20  9:13 PM   Specimen: BLOOD LEFT HAND  Result Value Ref Range Status   Specimen Description BLOOD LEFT HAND  Final   Special Requests   Final    BOTTLES DRAWN AEROBIC ONLY Blood Culture results may not be optimal due to an inadequate volume of blood received in culture bottles   Culture   Final    NO GROWTH 2 DAYS Performed at [facility], [place] [address], [place], [place] [address]    Report Status PENDING  Incomplete  Culture, blood (routine x 2)     Status: None (Preliminary result)   Collection Time: 01/05/20  9:24 PM   Specimen: BLOOD LEFT FOREARM  Result Value Ref Range Status   Specimen Description BLOOD LEFT FOREARM  Final   Special Requests   Final    BOTTLES DRAWN AEROBIC ONLY Blood Culture results may not be optimal due to an inadequate volume of blood received in culture bottles   Culture   Final    NO GROWTH 2 DAYS Performed at [facility], [place] [address] [place], [place] [address]    Report Status PENDING  Incomplete         Radiology Studies: No results  found.      Scheduled Meds: . amLODipine  5 mg Oral Daily  . budesonide (PULMICORT) nebulizer solution  0.5 mg Nebulization BID  . chlordiazePOXIDE  25 mg Oral BID   Followed by  . [START ON 01/09/2020] chlordiazePOXIDE  25 mg Oral Once  . enoxaparin (LOVENOX) injection  40 mg Subcutaneous Q24H  . folic acid  1 mg Oral Daily  . lactulose  20 g Oral TID  . metoprolol tartrate  25 mg Oral BID  . multivitamin with minerals  1 tablet Oral Daily  . nicotine  14 mg Transdermal Daily  . [START ON 01/10/2020] thiamine  100 mg Oral TID   Continuous Infusions: . cefTRIAXone (ROCEPHIN)  IV Stopped (01/08/20 0736)  . lactated ringers 75 mL/hr at 01/07/20 1031  . thiamine injection Stopped (01/08/20 0735)   Followed by  . thiamine injection       LOS: 3 days   Time spent= 35 mins    Venson Ferencz Arsenio Loader, MD [facility]  If 7PM-7AM, please contact night-coverage  01/08/2020, 8:33 AM

## 2020-01-09 LAB — COMPREHENSIVE METABOLIC PANEL
ALT: 39 U/L (ref 0–44)
AST: 53 U/L — ABNORMAL HIGH (ref 15–41)
Albumin: 2.7 g/dL — ABNORMAL LOW (ref 3.5–5.0)
Alkaline Phosphatase: 91 U/L (ref 38–126)
Anion gap: 12 (ref 5–15)
BUN: 5 mg/dL — ABNORMAL LOW (ref 6–20)
CO2: 23 mmol/L (ref 22–32)
Calcium: 8.9 mg/dL (ref 8.9–10.3)
Chloride: 103 mmol/L (ref 98–111)
Creatinine, Ser: 0.79 mg/dL (ref 0.44–1.00)
GFR calc Af Amer: >60 mL/min (ref >60)
GFR calc non Af Amer: >60 mL/min (ref >60)
Glucose, Bld: 109 mg/dL — ABNORMAL HIGH (ref 70–99)
Potassium: 3.4 mmol/L — ABNORMAL LOW (ref 3.5–5.1)
Sodium: 138 mmol/L (ref 135–145)
Total Bilirubin: 0.7 mg/dL (ref 0.3–1.2)
Total Protein: 5.7 g/dL — ABNORMAL LOW (ref 6.5–8.1)

## 2020-01-09 LAB — MAGNESIUM: Magnesium: 1.4 mg/dL — ABNORMAL LOW (ref 1.7–2.4)

## 2020-01-09 LAB — CBC
HCT: 36 % (ref 36.0–46.0)
Hemoglobin: 11.2 g/dL — ABNORMAL LOW (ref 12.0–15.0)
MCH: 25.5 pg — ABNORMAL LOW (ref 26.0–34.0)
MCHC: 31.1 g/dL (ref 30.0–36.0)
MCV: 82 fL (ref 80.0–100.0)
Platelets: 134 10*3/uL — ABNORMAL LOW (ref 150–400)
RBC: 4.39 MIL/uL (ref 3.87–5.11)
RDW: 19.8 % — ABNORMAL HIGH (ref 11.5–15.5)
WBC: 7.4 10*3/uL (ref 4.0–10.5)
nRBC: 0 % (ref 0.0–0.2)

## 2020-01-09 MED ORDER — SODIUM CHLORIDE 0.9 % IV BOLUS
1000.0000 mL | Freq: Once | INTRAVENOUS | Status: AC
  Administered 2020-01-09: 1000 mL via INTRAVENOUS

## 2020-01-09 MED ORDER — ALPRAZOLAM 0.25 MG PO TABS
0.2500 mg | ORAL_TABLET | Freq: Once | ORAL | Status: AC
  Administered 2020-01-09: 0.25 mg via ORAL
  Filled 2020-01-09: qty 1

## 2020-01-09 NOTE — Progress Notes (Signed)
Asked Dr. Reesa Chew for cardiac monitoring for pt, order received.

## 2020-01-09 NOTE — Plan of Care (Signed)

## 2020-01-09 NOTE — Progress Notes (Signed)
PROGRESS NOTE    Marissa Weber  [id number] DOB: [birth year] DOA: 01/04/2020 PCP: [name], MD   Brief Narrative: 59 year old female with multiple comorbidities including PAF, asthma, hypertension, obesity with BMI 35.3 presents with 2-day history of intractable nausea, vomiting, diarrhea.  In the ED found to have lactic acidosis dehydration severe hypokalemia, severe hypomagnesemia.  CT imaging of the abdomen and urinalysis unremarkable.  Patient also had syncope on standing up on 5/10 and in the ED.  CT head no acute finding.  COVID-19 negative.  Admitted with suspicion of acute viral gastroenteritis causing severe dehydration and syncope.  Given alcohol use there is concerns of Wernicke's encephalopathy due to confusion abnormal gait therefore started on high-dose thiamine.    Assessment & Plan:   Principal Problem:   Nausea vomiting and diarrhea Active Problems:   AF (paroxysmal atrial fibrillation) (HCC)   Acute gastroenteritis   Hypokalemia due to excessive gastrointestinal loss of potassium   Hypomagnesemia   SIRS (systemic inflammatory response syndrome) (HCC)   Orthostatic syncope   Abnormal LFTs   Lactic acidosis   Intractable nausea and vomiting  Nausea vomiting diarrhea likely secondary to acute viral gastroenteritis SIRS secondary to dehydration acute viral gastroenteritis -CT of the abdomen pelvis is negative for acute pathology.  UA-negative. -Supportive care, antiemetics. -Stool studies-negative -GI panel-negative -Blood cultures-Streptococcus orals-likely contaminant.  Repeat cultures remain negative  Streptococcus oralis bacteremia; infection vs contaminate.  Acute bronchitis with daily tobacco use Nicotine patch, repeat cultures remain negative.  Will discontinue IV Rocephin in next 24 hours. Scheduled and as needed bronchodilators.  Pulmicort. Low threshold to start prednisone if necessary.  Currently not wheezing or hypoxic. Supplemental oxygen as  necessary. Procalcitonin negative  Acute metabolic encephalopathy, slowly improving. Abnormal gait Concerns for Wernicke's encephalopathy-altered mental status and abnormal gait Elevated ammonia, lactulose twice daily. Titrate to 3-4 soft bowel movements daily CT head negative TSH normal, B12 normal Thiamine 500 mg IV 3 times daily x 5 days followed by 250 mg BID x 3 days followed by PO.  PT recommends no home health.  Daily alcohol use Alcohol withdrawal protocol. Folate pending. B12 normal Continue multivitamin.  Moderate to severe dehydration, intravascular volume loss Lactic acidosis/hypokalemia/hypomagnesemia -Aggressive hydration.  Aggressive repletion of electrolytes  Orthostatic syncope secondary to dehydration -Echocardiogram-EF 55 to 60%.  Moderate LVH, grade 1 DD. -IV fluids.  Transaminitis, improving -Right upper quadrant ultrasound-periumbilical ventral hernia.  Overall unremarkable.  History of paroxysmal atrial fibrillation -On metoprolol.  On daily aspirin.  Essential hypertension -Norvasc, metoprolol  COPD exacerbation with bronchitis Tobacco use -Aggressive bronchodilators.  DVT prophylaxis: Lovenox Code Status: Full code Family Communication: Family at bedside  Status is: Inpatient  Remains inpatient appropriate because:Altered mental status   Dispo: The patient is from: Home              Anticipated d/c is to: Home              Anticipated d/c date is: 2 days              Patient currently is not medically stable to d/c.  Patient still forgetful and unsteady.  Maintain hospital stay for IV thiamine, aggressive bronchodilators due to abnormal breath sounds.    Subjective: Feeling slightly better but still quite unsteady per nursing staff.  Intermittent forgetful. Still having some nonproductive cough.  Review of Systems Otherwise negative except as per HPI, including: General: Denies fever, chills, night sweats or unintended weight  loss. Resp: Denies hemoptysis Cardiac: Denies  chest pain, palpitations, orthopnea, paroxysmal nocturnal dyspnea. GI: Denies abdominal pain, nausea, vomiting, diarrhea or constipation GU: Denies dysuria, frequency, hesitancy or incontinence MS: Denies muscle aches, joint pain or swelling Neuro: Denies headache, neurologic deficits (focal weakness, numbness, tingling), abnormal gait Psych: Denies anxiety, depression, SI/HI/AVH Skin: Denies new rashes or lesions ID: Denies sick contacts, exotic exposures, travel Examination: Constitutional: Not in acute distress Respiratory: Bilateral coarse breath sounds Cardiovascular: Normal sinus rhythm, no rubs Abdomen: Nontender nondistended good bowel sounds Musculoskeletal: No edema noted Skin: No rashes seen Neurologic: CN 2-12 grossly intact.  And nonfocal Psychiatric: Poor judgment and insight. Alert and oriented x 3. Normal mood. Objective: Vitals:   01/08/20 1358 01/08/20 2042 01/08/20 2220 01/09/20 0419  BP: 138/79 111/74  135/84  Pulse: 87 (!) 102  89  Resp: 18 18  20   Temp: 99 F (37.2 C) 98.9 F (37.2 C)  98.1 F (36.7 C)  TempSrc: Oral Oral  Oral  SpO2: 98% 98% 95% 97%  Weight:      Height:        Intake/Output Summary (Last 24 hours) at 01/09/2020 1350 Last data filed at 01/09/2020 1228 Gross per 24 hour  Intake 936.48 ml  Output 1201 ml  Net -264.52 ml   Filed Weights   01/04/20 2211 01/05/20 2107  Weight: 79.4 kg 80.8 kg     Data Reviewed:   CBC: Recent Labs  Lab 01/04/20 2222 01/04/20 2222 01/05/20 0824 01/05/20 0824 01/06/20 0135 01/06/20 1636 01/07/20 0213 01/08/20 0726 01/09/20 0408  WBC 4.5   < > 6.8  --  5.8  --  6.4 5.9 7.4  NEUTROABS 3.0  --  4.0  --   --   --   --   --   --   HGB 15.6*   < > 13.6  --  12.3  --  13.6 11.4* 11.2*  HCT 48.3*   < > 42.9   < > 38.8 41.7 43.6 36.8 36.0  MCV 80.0   < > 81.4  --  81.2  --  81.8 82.7 82.0  PLT 97*   < > PLATELET CLUMPS NOTED ON SMEAR, UNABLE TO  ESTIMATE  --  81*  --  87* 110* 134*   < > = values in this interval not displayed.   Basic Metabolic Panel: Recent Labs  Lab 01/05/20 0824 01/06/20 0135 01/07/20 0213 01/08/20 0726 01/09/20 0408  NA 139 135 137 140 138  K 3.3* 3.1* 4.6 3.3* 3.4*  CL 102 104 105 107 103  CO2 24 22 24 25 23   GLUCOSE 82 88 96 109* 109*  BUN <5* <5* <5* <5* 5*  CREATININE 0.70 0.59 0.72 0.83 0.79  CALCIUM 7.9* 7.6* 9.0 9.2 8.9  MG 1.6* 1.6* 2.0 1.4* 1.4*   GFR: Estimated Creatinine Clearance: 70.4 mL/min (by C-G formula based on SCr of 0.79 mg/dL). Liver Function Tests: Recent Labs  Lab 01/05/20 0824 01/06/20 0135 01/07/20 0213 01/08/20 0726 01/09/20 0408  AST 98* 68* 74* 53* 53*  ALT 56* 47* 49* 39 39  ALKPHOS 119 94 115 89 91  BILITOT 2.3* 1.6* 1.2 0.9 0.7  PROT 6.7 5.9* 6.9 5.9* 5.7*  ALBUMIN 2.9* 2.6* 3.2* 2.7* 2.7*   Recent Labs  Lab 01/04/20 2222  LIPASE 23   Recent Labs  Lab 01/06/20 1110 01/08/20 0726  AMMONIA 51* 35   Coagulation Profile: Recent Labs  Lab 01/05/20 0823  INR 1.1   Cardiac Enzymes: Recent Labs  Lab 01/05/20 0824  CKTOTAL 155   BNP (last 3 results) No results for input(s): PROBNP in the last 8760 hours. HbA1C: No results for input(s): HGBA1C in the last 72 hours. CBG: No results for input(s): GLUCAP in the last 168 hours. Lipid Profile: No results for input(s): CHOL, HDL, LDLCALC, TRIG, CHOLHDL, LDLDIRECT in the last 72 hours. Thyroid Function Tests: No results for input(s): TSH, T4TOTAL, FREET4, T3FREE, THYROIDAB in the last 72 hours. Anemia Panel: Recent Labs    01/06/20 1636  VITAMINB12 611   Sepsis Labs: Recent Labs  Lab 01/04/20 2222 01/05/20 0015 01/05/20 0646 01/07/20 0213  PROCALCITON  --   --   --  <0.10  LATICACIDVEN 3.7* 2.5* 1.4  --     Recent Results (from the past 240 hour(s))  Blood culture (routine x 2)     Status: Abnormal   Collection Time: 01/05/20 12:31 AM   Specimen: BLOOD  Result Value Ref Range  Status   Specimen Description BLOOD RIGHT FOREARM  Final   Special Requests   Final    BOTTLES DRAWN AEROBIC AND ANAEROBIC Blood Culture adequate volume   Culture  Setup Time   Final    GRAM POSITIVE COCCI CRITICAL RESULT CALLED TO, READ BACK BY AND VERIFIED WITH: T DANG PHARMD 1953 01/05/20 A BROWNING IN BOTH AEROBIC AND ANAEROBIC BOTTLES Performed at [facility], [place] [address] [place], [place] [address]    Culture STREPTOCOCCUS MITIS/ORALIS (A)  Final   Report Status 01/08/2020 FINAL  Final   Organism ID, Bacteria STREPTOCOCCUS MITIS/ORALIS  Final      Susceptibility   Streptococcus mitis/oralis - MIC*    PENICILLIN 1 INTERMEDIATE Intermediate     CEFTRIAXONE 1 SENSITIVE Sensitive     ERYTHROMYCIN 0.5 INTERMEDIATE Intermediate     LEVOFLOXACIN 2 SENSITIVE Sensitive     VANCOMYCIN 0.5 SENSITIVE Sensitive     * STREPTOCOCCUS MITIS/ORALIS  Blood Culture ID Panel (Reflexed)     Status: Abnormal   Collection Time: 01/05/20 12:31 AM  Result Value Ref Range Status   Enterococcus species NOT DETECTED NOT DETECTED Final   Listeria monocytogenes NOT DETECTED NOT DETECTED Final   Staphylococcus species NOT DETECTED NOT DETECTED Final   Staphylococcus aureus (BCID) NOT DETECTED NOT DETECTED Final   Streptococcus species DETECTED (A) NOT DETECTED Final    Comment: Not Enterococcus species, Streptococcus agalactiae, Streptococcus pyogenes, or Streptococcus pneumoniae. CRITICAL RESULT CALLED TO, READ BACK BY AND VERIFIED WITH: T DANG PHARMD 1953 01/05/20 A BROWNING    Streptococcus agalactiae NOT DETECTED NOT DETECTED Final   Streptococcus pneumoniae NOT DETECTED NOT DETECTED Final   Streptococcus pyogenes NOT DETECTED NOT DETECTED Final   Acinetobacter baumannii NOT DETECTED NOT DETECTED Final   Enterobacteriaceae species NOT DETECTED NOT DETECTED Final   Enterobacter cloacae complex NOT DETECTED NOT DETECTED Final   Escherichia coli NOT DETECTED NOT DETECTED Final   Klebsiella  oxytoca NOT DETECTED NOT DETECTED Final   Klebsiella pneumoniae NOT DETECTED NOT DETECTED Final   Proteus species NOT DETECTED NOT DETECTED Final   Serratia marcescens NOT DETECTED NOT DETECTED Final   Haemophilus influenzae NOT DETECTED NOT DETECTED Final   Neisseria meningitidis NOT DETECTED NOT DETECTED Final   Pseudomonas aeruginosa NOT DETECTED NOT DETECTED Final   Candida albicans NOT DETECTED NOT DETECTED Final   Candida glabrata NOT DETECTED NOT DETECTED Final   Candida krusei NOT DETECTED NOT DETECTED Final   Candida parapsilosis NOT DETECTED NOT DETECTED Final   Candida tropicalis NOT DETECTED NOT DETECTED  Final    Comment: Performed at [facility], [place] [address], [place], [place] [address]  Blood culture (routine x 2)     Status: None (Preliminary result)   Collection Time: 01/05/20 12:59 AM   Specimen: BLOOD  Result Value Ref Range Status   Specimen Description BLOOD SITE NOT SPECIFIED  Final   Special Requests   Final    BOTTLES DRAWN AEROBIC ONLY Blood Culture results may not be optimal due to an inadequate volume of blood received in culture bottles   Culture   Final    NO GROWTH 4 DAYS Performed at [facility], [place] [address] [place], [place] [address]    Report Status PENDING  Incomplete  SARS Coronavirus 2 by RT PCR (hospital order, performed in [facility] hospital lab) Nasopharyngeal Nasopharyngeal Swab     Status: None   Collection Time: 01/05/20  3:02 AM   Specimen: Nasopharyngeal Swab  Result Value Ref Range Status   SARS Coronavirus 2 NEGATIVE NEGATIVE Final    Comment: (NOTE) SARS-CoV-2 target nucleic acids are NOT DETECTED. The SARS-CoV-2 RNA is generally detectable in upper and lower respiratory specimens during the acute phase of infection. The lowest concentration of SARS-CoV-2 viral copies this assay can detect is 250 copies / mL. A negative result does not preclude SARS-CoV-2 infection and should not be used as the sole basis for  treatment or other patient management decisions.  A negative result may occur with improper specimen collection / handling, submission of specimen other than nasopharyngeal swab, presence of viral mutation(s) within the areas targeted by this assay, and inadequate number of viral copies (<250 copies / mL). A negative result must be combined with clinical observations, patient history, and epidemiological information. Fact Sheet for Patients:   StrictlyIdeas.no Fact Sheet for Healthcare Providers: BankingDealers.co.za This test is not yet approved or cleared  by the [place] FDA and has been authorized for detection and/or diagnosis of SARS-CoV-2 by FDA under an Emergency Use Authorization (EUA).  This EUA will remain in effect (meaning this test can be used) for the duration of the COVID-19 declaration under Section 564(b)(1) of the Act, 21 U.S.C. section 360bbb-3(b)(1), unless the authorization is terminated or revoked sooner. Performed at [facility], [place] [address], [place], [place] [address]   Gastrointestinal Panel by PCR , Stool     Status: None   Collection Time: 01/05/20  2:15 PM   Specimen: Stool  Result Value Ref Range Status   Campylobacter species NOT DETECTED NOT DETECTED Final   Plesimonas shigelloides NOT DETECTED NOT DETECTED Final   Salmonella species NOT DETECTED NOT DETECTED Final   Yersinia enterocolitica NOT DETECTED NOT DETECTED Final   Vibrio species NOT DETECTED NOT DETECTED Final   Vibrio cholerae NOT DETECTED NOT DETECTED Final   Enteroaggregative E coli (EAEC) NOT DETECTED NOT DETECTED Final   Enteropathogenic E coli (EPEC) NOT DETECTED NOT DETECTED Final   Enterotoxigenic E coli (ETEC) NOT DETECTED NOT DETECTED Final   Shiga like toxin producing E coli (STEC) NOT DETECTED NOT DETECTED Final   Shigella/Enteroinvasive E coli (EIEC) NOT DETECTED NOT DETECTED Final   Cryptosporidium NOT DETECTED NOT  DETECTED Final   Cyclospora cayetanensis NOT DETECTED NOT DETECTED Final   Entamoeba histolytica NOT DETECTED NOT DETECTED Final   Giardia lamblia NOT DETECTED NOT DETECTED Final   Adenovirus F40/41 NOT DETECTED NOT DETECTED Final   Astrovirus NOT DETECTED NOT DETECTED Final   Norovirus GI/GII NOT DETECTED  NOT DETECTED Final   Rotavirus A NOT DETECTED NOT DETECTED Final   Sapovirus (I, II, IV, and V) NOT DETECTED NOT DETECTED Final    Comment: Performed at [facility], [place], [place] [address]  C Difficile Quick Screen w PCR reflex     Status: None   Collection Time: 01/05/20  2:15 PM   Specimen: Stool  Result Value Ref Range Status   C Diff antigen NEGATIVE NEGATIVE Final   C Diff toxin NEGATIVE NEGATIVE Final   C Diff interpretation No C. difficile detected.  Final    Comment: Performed at [facility], [place] [address] [place], [place] [address]  Culture, blood (routine x 2)     Status: None (Preliminary result)   Collection Time: 01/05/20  9:13 PM   Specimen: BLOOD LEFT HAND  Result Value Ref Range Status   Specimen Description BLOOD LEFT HAND  Final   Special Requests   Final    BOTTLES DRAWN AEROBIC ONLY Blood Culture results may not be optimal due to an inadequate volume of blood received in culture bottles   Culture   Final    NO GROWTH 4 DAYS Performed at [facility], [place] [address] [place], [place] [address]    Report Status PENDING  Incomplete  Culture, blood (routine x 2)     Status: None (Preliminary result)   Collection Time: 01/05/20  9:24 PM   Specimen: BLOOD LEFT FOREARM  Result Value Ref Range Status   Specimen Description BLOOD LEFT FOREARM  Final   Special Requests   Final    BOTTLES DRAWN AEROBIC ONLY Blood Culture results may not be optimal due to an inadequate volume of blood received in culture bottles   Culture   Final    NO GROWTH 4 DAYS Performed at [facility], [place] [address], [place], [place]  [address]    Report Status PENDING  Incomplete  Culture, blood (Routine X 2) w Reflex to ID Panel     Status: None (Preliminary result)   Collection Time: 01/07/20  9:08 AM   Specimen: BLOOD  Result Value Ref Range Status   Specimen Description BLOOD LEFT ANTECUBITAL  Final   Special Requests   Final    BOTTLES DRAWN AEROBIC AND ANAEROBIC Blood Culture adequate volume   Culture   Final    NO GROWTH 2 DAYS Performed at [facility], [place] [address] [place], [place] [address]    Report Status PENDING  Incomplete  Culture, blood (Routine X 2) w Reflex to ID Panel     Status: None (Preliminary result)   Collection Time: 01/07/20  9:08 AM   Specimen: BLOOD  Result Value Ref Range Status   Specimen Description BLOOD RIGHT ANTECUBITAL  Final   Special Requests   Final    BOTTLES DRAWN AEROBIC AND ANAEROBIC Blood Culture results may not be optimal due to an inadequate volume of blood received in culture bottles   Culture   Final    NO GROWTH 2 DAYS Performed at [facility], [place] [address] [place], [place] [address]    Report Status PENDING  Incomplete         Radiology Studies: No results found.      Scheduled Meds: . amLODipine  5 mg Oral Daily  . chlordiazePOXIDE  25 mg Oral Once  . enoxaparin (LOVENOX) injection  40 mg Subcutaneous Q24H  . folic acid  1 mg  Oral Daily  . lactulose  20 g Oral TID  . metoprolol tartrate  25 mg Oral BID  . multivitamin with minerals  1 tablet Oral Daily  . nicotine  14 mg Transdermal Daily  . [START ON 01/10/2020] thiamine  100 mg Oral TID   Continuous Infusions: . cefTRIAXone (ROCEPHIN)  IV Stopped (01/08/20 2202)  . thiamine injection 250 mg (01/09/20 1052)     LOS: 4 days   Time spent= 35 mins    [place] Rico Arsenio Loader, MD [facility]  If 7PM-7AM, please contact night-coverage  01/09/2020, 1:50 PM

## 2020-01-09 NOTE — Progress Notes (Signed)
Drop in BP (70s/40s) then with recheck 91/60, pt states she feels "weak".  Dr. Reesa Chew notified, order for NS bolus.

## 2020-01-10 LAB — BASIC METABOLIC PANEL
Anion gap: 9 (ref 5–15)
BUN: 5 mg/dL — ABNORMAL LOW (ref 6–20)
CO2: 25 mmol/L (ref 22–32)
Calcium: 9 mg/dL (ref 8.9–10.3)
Chloride: 104 mmol/L (ref 98–111)
Creatinine, Ser: 0.64 mg/dL (ref 0.44–1.00)
GFR calc Af Amer: >60 mL/min (ref >60)
GFR calc non Af Amer: >60 mL/min (ref >60)
Glucose, Bld: 112 mg/dL — ABNORMAL HIGH (ref 70–99)
Potassium: 3.5 mmol/L (ref 3.5–5.1)
Sodium: 138 mmol/L (ref 135–145)

## 2020-01-10 LAB — CBC
HCT: 37 % (ref 36.0–46.0)
Hemoglobin: 11.7 g/dL — ABNORMAL LOW (ref 12.0–15.0)
MCH: 26.2 pg (ref 26.0–34.0)
MCHC: 31.6 g/dL (ref 30.0–36.0)
MCV: 83 fL (ref 80.0–100.0)
Platelets: 196 10*3/uL (ref 150–400)
RBC: 4.46 MIL/uL (ref 3.87–5.11)
RDW: 19.7 % — ABNORMAL HIGH (ref 11.5–15.5)
WBC: 7.8 10*3/uL (ref 4.0–10.5)
nRBC: 0 % (ref 0.0–0.2)

## 2020-01-10 LAB — CULTURE, BLOOD (ROUTINE X 2)
Culture: NO GROWTH
Culture: NO GROWTH
Culture: NO GROWTH

## 2020-01-10 LAB — MAGNESIUM: Magnesium: 1.6 mg/dL — ABNORMAL LOW (ref 1.7–2.4)

## 2020-01-10 MED ORDER — NICOTINE 14 MG/24HR TD PT24: 14.0000 mg | MEDICATED_PATCH | Freq: Every day | TRANSDERMAL | 0 refills | Status: DC

## 2020-01-10 MED ORDER — FOLIC ACID 1 MG PO TABS: 1.0000 mg | ORAL_TABLET | Freq: Every day | ORAL | 0 refills | Status: DC

## 2020-01-10 MED ORDER — MAGNESIUM SULFATE 4 GM/100ML IV SOLN
4.0000 g | Freq: Once | INTRAVENOUS | Status: AC
  Administered 2020-01-10: 4 g via INTRAVENOUS
  Filled 2020-01-10: qty 100

## 2020-01-10 MED ORDER — THIAMINE HCL 100 MG PO TABS: ORAL_TABLET | ORAL | 0 refills | Status: AC

## 2020-01-10 MED ORDER — ADULT MULTIVITAMIN W/MINERALS CH: 1.0000 | ORAL_TABLET | Freq: Every day | ORAL | 0 refills | Status: DC

## 2020-01-10 MED ORDER — POTASSIUM CHLORIDE CRYS ER 20 MEQ PO TBCR
40.0000 meq | EXTENDED_RELEASE_TABLET | Freq: Once | ORAL | Status: AC
  Administered 2020-01-10: 40 meq via ORAL
  Filled 2020-01-10: qty 2

## 2020-01-10 NOTE — Progress Notes (Signed)
Physical Therapy Treatment Patient Details Name: Marissa Weber MRN: [id number] DOB: [birth year] Today's Date: 01/10/2020    History of Present Illness 59 year old female presenting to ED on 01/05/20 with nausea and vomiting for 2 days after fall 01/04/20(syncope?). While in the waiting room at [name] emergency department the patient again arose from a seated position experienced an episode of intense lightheadedness and again lost consciousness. CT imaging of the abdomen/pelvis and head unremarkable.  Pt with past medical history of paroxysmal atrial fibrillation, asthma, hypertension.    PT Comments    Pt OOB in chair upon arrival of PT, agreeable to PT session as the pt and her guest have some concern's about safety with d/c and wanted to address those with PT this morning. Despite some continued limitations in functional strength, endurance, and stability, the pt was able to demo good safety awareness and stability with use of RW during ambulation in room and with navigation of 4 steps. The pt verbalized that this improved her confidence in ability to safely d/c home to live with one of her children. The pt will benefit from continued skilled PT with [facility] PT services to progress functional strength and return to confidence and independence with mobility, but she is safe to return home with family supervision for mobility and assist with IADLs.    Follow Up Recommendations  Home health PT;Supervision for mobility/OOB     Equipment Recommendations  None recommended by PT(pt has RW and cane)    Recommendations for Other Services       Precautions / Restrictions Precautions Precautions: Fall Restrictions Weight Bearing Restrictions: No    Mobility  Bed Mobility Overal bed mobility: Independent             General bed mobility comments: Pt sitting EOB upon entry into room  Transfers Overall transfer level: Needs assistance Equipment used: Rolling walker (2 wheeled)    Sit to Stand: Supervision         General transfer comment: Supervision for safety, VSS without evidence of orthostatics. discussed moving slowly when first up. completed x4 through sesssion with good stability  Ambulation/Gait Ambulation/Gait assistance: Supervision Gait Distance (Feet): 30 Feet Assistive device: Rolling walker (2 wheeled) Gait Pattern/deviations: Step-through pattern;Wide base of support;Decreased stride length Gait velocity: decreased Gait velocity interpretation: <1.31 ft/sec, indicative of household ambulator General Gait Details: use of RW for increased stability and comfort with ambulation this session, pt reports fatigue but no evidence of instabiltiy   Stairs Stairs: Yes Stairs assistance: Min guard Stair Management: One rail Left;Step to pattern;With cane Number of Stairs: 4 General stair comments: pt able to complete with minG for safety, pt reports she will be able to navigate stairs at children's homes with supervision of one of them for safety   Wheelchair Mobility    Modified Rankin (Stroke Patients Only)       Balance Overall balance assessment: Needs assistance Sitting-balance support: No upper extremity supported Sitting balance-Leahy Scale: Normal     Standing balance support: During functional activity;Bilateral upper extremity supported Standing balance-Leahy Scale: Good Standing balance comment: pt able to stand with single UE support, BUE support for ambulation                            Cognition Arousal/Alertness: Awake/alert Behavior During Therapy: WFL for tasks assessed/performed Overall Cognitive Status: No family/caregiver present to determine baseline cognitive functioning Area of Impairment: Following commands;Problem solving  Following Commands: Follows multi-step commands with increased time     Problem Solving: Requires verbal cues;Difficulty sequencing General  Comments: Pt benefitted from [facility] and encouragement to boost confidence in mobility      Exercises      General Comments General comments (skin integrity, edema, etc.): Pt reported minor dizziness with stand, VSS, resolved with activity.      Pertinent Vitals/Pain Pain Assessment: No/denies pain Pain Intervention(s): Repositioned;Monitored during session    Home Living                      Prior Function            PT Goals (current goals can now be found in the care plan section) Acute Rehab PT Goals Patient Stated Goal: home PT Goal Formulation: With patient Time For Goal Achievement: 01/21/20 Potential to Achieve Goals: Good Progress towards PT goals: Progressing toward goals    Frequency    Min 3X/week      PT Plan Discharge plan needs to be updated    Co-evaluation              AM-PAC PT "6 Clicks" Mobility   Outcome Measure  Help needed turning from your back to your side while in a flat bed without using bedrails?: None Help needed moving from lying on your back to sitting on the side of a flat bed without using bedrails?: None Help needed moving to and from a bed to a chair (including a wheelchair)?: A Little Help needed standing up from a chair using your arms (e.g., wheelchair or bedside chair)?: None Help needed to walk in hospital room?: A Little Help needed climbing 3-5 steps with a railing? : A Little 6 Click Score: 21    End of Session Equipment Utilized During Treatment: Gait belt Activity Tolerance: Patient tolerated treatment well Patient left: with call bell/phone within reach;in bed;with bed alarm set;Other (comment) Nurse Communication: Mobility status(d/c need) PT Visit Diagnosis: Unsteadiness on feet (R26.81);Muscle weakness (generalized) (M62.81);Pain Pain - Right/Left: Right Pain - part of body: Shoulder     Time: [id number] PT Time Calculation (min) (ACUTE ONLY): 44 min  Charges:  $Gait Training: 23-37 mins $Self  Care/Home Management: 8-22                     Karma Ganja, PT, DPT   Acute Rehabilitation Department Pager #: [phone]   Otho Bellows 01/10/2020, 11:28 AM

## 2020-01-10 NOTE — Discharge Summary (Signed)
Physician Discharge Summary  Marissa Weber [id number] DOB: 10-27-1960 DOA: 01/04/2020  PCP: [name], MD  Admit date: 01/04/2020 Discharge date: 01/10/2020  Admitted From:Home Disposition:  Home  Recommendations for Outpatient Follow-up:  1. Follow up with PCP in 1 weeks 2. Please obtain BMP/CBC in one week your next doctors visit.  3. Oral Thiamine TID x 7 days then daily  4. Counseled to quite using alcohol and tobacoo   Home Health: PT/OT Discharge Condition: Stable CODE STATUS: Full  Diet recommendation: Heart Healthy  Brief/Interim Summary: 59 year old female with multiple comorbidities including PAF, asthma, hypertension,obesity with BMI 35.3 presents with 2-day history of intractable nausea, vomiting, diarrhea. In the ED found to have lactic acidosis dehydration severe hypokalemia, severe hypomagnesemia. CT imaging of the abdomen and urinalysis unremarkable.Patient also had syncope on standing up on 5/10 and in the ED. CT head no acute finding.COVID-19 negative.  Admitted with suspicion of acute viral gastroenteritis causing severe dehydration and syncope.  Given alcohol use there is concerns of Wernicke's encephalopathy due to confusion abnormal gait therefore started on high-dose thiamine.  Over the course of few days patient's mentation cleared up and she was alert awake oriented X3 back to baseline.  During hospitalization she also had COPD exacerbation therefore received bronchodilators.  Repeat cultures remain negative.  Now there is suspicion that her initial bacteremia could also have been contaminated therefore will discontinue antibiotics for now.  PT recommends home health therefore arrangements will be made. Her fianc at bedside.   Nausea vomiting diarrhea likely secondary to acute viral gastroenteritis, resolved SIRS secondary to dehydration acute viral gastroenteritis, resolved -CT of the abdomen pelvis is negative for acute pathology.   UA-negative. -Supportive care, antiemetics. -Stool studies-negative -GI panel-negative -Blood cultures-Streptococcus orals-likely contaminant.    Repeat cultures remain negative.  Discontinue Rocephin.  Streptococcus oralis bacteremia; infection vs contaminate.  Acute bronchitis with daily tobacco use Nicotine patch, repeat cultures remain negative.    Completed about 5 days of IV Rocephin, no further need of antibiotics. Breath sounds are much better-continue using bronchodilators as needed at home. Supplemental oxygen as necessary. Procalcitonin negative  Acute metabolic encephalopathy, resolved Abnormal gait Concerns for Wernicke's encephalopathy-altered mental status and abnormal gait Ammonia levels are normalized. CT head negative TSH normal, B12 normal We will transition from IV thiamine to oral thiamine 3 times daily for another week then transition to daily Daily folic acid and multivitamin PT/OT recommending home health.  Daily alcohol use Alcohol withdrawal protocol. Folate normal. B12 normal Continue multivitamin.  Moderate to severe dehydration, intravascular volume loss Lactic acidosis/hypokalemia/hypomagnesemia -Resolved  Orthostatic syncope secondary to dehydration -Echocardiogram-EF 55 to 60%.  Moderate LVH, grade 1 DD. -Resolved  Transaminitis, resolved -Right upper quadrant ultrasound-periumbilical ventral hernia.  Overall unremarkable.  History of paroxysmal atrial fibrillation -On metoprolol.  On daily aspirin.  Essential hypertension -Norvasc, metoprolol  COPD exacerbation with bronchitis Tobacco use -Aggressive bronchodilators.  Discharge Diagnoses:  Principal Problem:   Nausea vomiting and diarrhea Active Problems:   AF (paroxysmal atrial fibrillation) (HCC)   Acute gastroenteritis   Hypokalemia due to excessive gastrointestinal loss of potassium   Hypomagnesemia   SIRS (systemic inflammatory response syndrome) (HCC)    Orthostatic syncope   Abnormal LFTs   Lactic acidosis   Intractable nausea and vomiting    Consultations:  None  Subjective: Feels great no complaints.  She is working on figuring out which daughter she is going to go stay with.  Discharge Exam: Vitals:   01/09/20 2043 01/10/20 0424  BP: 112/66 (!) 152/99  Pulse: 84 79  Resp: 18 17  Temp: 98.5 F (36.9 C) 99.3 F (37.4 C)  SpO2: 94% 99%   Vitals:   01/09/20 0419 01/09/20 1442 01/09/20 2043 01/10/20 0424  BP: 135/84 97/61 112/66 (!) 152/99  Pulse: 89 77 84 79  Resp: 20 18 18 17   Temp: 98.1 F (36.7 C) 99 F (37.2 C) 98.5 F (36.9 C) 99.3 F (37.4 C)  TempSrc: Oral Oral Oral Oral  SpO2: 97% 95% 94% 99%  Weight:      Height:        General: Pt is alert, awake, not in acute distress Cardiovascular: RRR, S1/S2 +, no rubs, no gallops Respiratory: CTA bilaterally, no wheezing, no rhonchi Abdominal: Soft, NT, ND, bowel sounds + Extremities: no edema, no cyanosis  Discharge Instructions  Discharge Instructions    Diet - low sodium heart healthy   Complete by: As directed    Increase activity slowly   Complete by: As directed      Allergies as of 01/10/2020      Reactions   Bee Venom Swelling, Other (See Comments)   Swelling wherever the sting is. Also gets dizzy   Lisinopril Swelling      Medication List    STOP taking these medications   diltiazem 60 MG tablet Commonly known as: CARDIZEM   oxyCODONE 5 MG immediate release tablet Commonly known as: Oxy IR/ROXICODONE   rivaroxaban 10 MG Tabs tablet Commonly known as: XARELTO     TAKE these medications   albuterol 108 (90 Base) MCG/ACT inhaler Commonly known as: VENTOLIN HFA Inhale 2 puffs into the lungs every 4 (four) hours as needed for wheezing or shortness of breath.   amLODipine 5 MG tablet Commonly known as: NORVASC Take 5 mg by mouth daily.   aspirin EC 81 MG tablet Take 81 mg by mouth daily.   folic acid 1 MG tablet Commonly known  as: FOLVITE Take 1 tablet (1 mg total) by mouth daily.   methocarbamol 500 MG tablet Commonly known as: ROBAXIN Take 1 tablet (500 mg total) by mouth every 6 (six) hours as needed for muscle spasms.   metoprolol tartrate 25 MG tablet Commonly known as: LOPRESSOR Take 1 tablet (25 mg total) by mouth 2 (two) times daily.   multivitamin with minerals Tabs tablet Take 1 tablet by mouth daily.   nicotine 14 mg/24hr patch Commonly known as: NICODERM CQ - dosed in mg/24 hours Place 1 patch (14 mg total) onto the skin daily.   thiamine 100 MG tablet Take 1 tablet (100 mg total) by mouth 3 (three) times daily for 7 days, THEN 1 tablet (100 mg total) daily for 23 days. Start taking on: Jan 10, 2020   tiZANidine 2 MG tablet Commonly known as: ZANAFLEX Take 2 mg by mouth at bedtime as needed for muscle spasms.      Follow-up Information    [name], MD. Schedule an appointment as soon as possible for a visit in 1 week(s).   Specialty: Cardiology Contact information: [place] [address] [phone]          Allergies  Allergen Reactions  . Bee Venom Swelling and Other (See Comments)    Swelling wherever the sting is. Also gets dizzy  . Lisinopril Swelling    You were cared for by a hospitalist during your hospital stay. If you have any questions about your discharge medications or the care you received while you  were in the hospital after you are discharged, you can call the unit and asked to speak with the hospitalist on call if the hospitalist that took care of you is not available. Once you are discharged, your primary care physician will handle any further medical issues. Please note that no refills for any discharge medications will be authorized once you are discharged, as it is imperative that you return to your primary care physician (or establish a relationship with a primary care physician if you do not have one) for your aftercare needs so  that they can reassess your need for medications and monitor your lab values.   Procedures/Studies: DG Ribs Unilateral W/Chest Left  Result Date: 01/05/2020 CLINICAL DATA:  [place], left lateral rib pain EXAM: LEFT RIBS AND CHEST - 3+ VIEW COMPARISON:  12/24/2018 FINDINGS: Frontal and oblique views of the left thoracic cage are obtained. Cardiac silhouette is unremarkable. No airspace disease, effusion, or pneumothorax. No acute displaced rib fractures. IMPRESSION: 1. No acute intrathoracic process. Electronically Signed   By: Randa Ngo M.D.   On: 01/05/2020 00:02   CT Head Wo Contrast  Result Date: 01/05/2020 CLINICAL DATA:  Dizziness, fall, headache, hypertension EXAM: CT HEAD WITHOUT CONTRAST TECHNIQUE: Contiguous axial images were obtained from the base of the skull through the vertex without intravenous contrast. COMPARISON:  10/01/2018 FINDINGS: Brain: No acute infarct or hemorrhage. Lateral ventricles and midline structures are unremarkable. No acute extra-axial fluid collections. No mass effect. Vascular: No hyperdense vessel or unexpected calcification. Skull: Normal. Negative for fracture or focal lesion. Sinuses/Orbits: No acute finding. Other: None. IMPRESSION: 1. Stable head CT, no acute process. Electronically Signed   By: Randa Ngo M.D.   On: 01/05/2020 00:09   CT ABDOMEN PELVIS W CONTRAST  Result Date: 01/05/2020 CLINICAL DATA:  Nausea, vomiting, and diarrhea. Generalized abdominal pain EXAM: CT ABDOMEN AND PELVIS WITH CONTRAST TECHNIQUE: Multidetector CT imaging of the abdomen and pelvis was performed using the standard protocol following bolus administration of intravenous contrast. CONTRAST:  122mL OMNIPAQUE IOHEXOL 300 MG/ML  SOLN COMPARISON:  None. FINDINGS: Lower chest:  No contributory findings. Hepatobiliary: Hepatic steatosis.Cholecystectomy. Pancreas: Unremarkable. Spleen: Unremarkable. Adrenals/Urinary Tract: Negative adrenals. No hydronephrosis or stone. Undulating  left more than right renal cortex attributed to mild patchy scarring. Unremarkable bladder when allowing for artifact from bilateral hip prosthesis. Stomach/Bowel: No obstruction or visible inflammation, including appendicitis. Vascular/Lymphatic: No acute vascular abnormality. Mild aortoiliac atherosclerosis. No mass or adenopathy. Reproductive:No pathologic findings. Other: No ascites or pneumoperitoneum. Fatty umbilical to infra umbilical hernia. Musculoskeletal: No acute abnormalities. Bilateral hip arthroplasty. L3-4 and L4-5 PLIF. There is adjacent segment L2-3 disc and facet degeneration with spinal stenosis. IMPRESSION: 1. No acute finding. 2. Hepatic steatosis. Electronically Signed   By: Monte Fantasia M.D.   On: 01/05/2020 05:12   ECHOCARDIOGRAM COMPLETE  Result Date: 01/05/2020    ECHOCARDIOGRAM REPORT   Patient Name:   TESHIA LUMA Date of Exam: 01/05/2020 Medical Rec #:  [id number]     Height:       59.0 in Accession #:    [id number]    Weight:       175.0 lb Date of Birth:  [birth year]     BSA:          1.743 m Patient Age:    37 years      BP:           100/79 mmHg Patient Gender: F  HR:           80 bpm. Exam Location:  Inpatient Procedure: 2D Echo, Cardiac Doppler and Color Doppler Indications:     Syncope 780.2/R55  History:         Patient has prior history of Echocardiogram examinations, most                  recent 11/28/2016. Arrythmias:Atrial Fibrillation; Risk                  Factors:Current Smoker and Hypertension.  Sonographer:     Clayton Lefort RDCS (AE) Referring Phys:  [id number] [place] Diagnosing Phys: [name] MD IMPRESSIONS  1. Left ventricular ejection fraction, by estimation, is 55 to 60%. The left ventricle has normal function. The left ventricle has no regional wall motion abnormalities. There is moderate concentric left ventricular hypertrophy. Left ventricular diastolic parameters are consistent with Grade I diastolic dysfunction (impaired relaxation).  2.  Right ventricular systolic function is normal. The right ventricular size is normal.  3. Left atrial size was mildly dilated.  4. The mitral valve is degenerative. Mild mitral valve regurgitation.  5. The aortic valve is tricuspid. Aortic valve regurgitation is not visualized. Mild aortic valve sclerosis is present, with no evidence of aortic valve stenosis.  6. The inferior vena cava is dilated in size with <50% respiratory variability, suggesting right atrial pressure of 15 mmHg. FINDINGS  Left Ventricle: Left ventricular ejection fraction, by estimation, is 55 to 60%. The left ventricle has normal function. The left ventricle has no regional wall motion abnormalities. The left ventricular internal cavity size was normal in size. There is  moderate concentric left ventricular hypertrophy. Left ventricular diastolic parameters are consistent with Grade I diastolic dysfunction (impaired relaxation). Right Ventricle: The right ventricular size is normal. No increase in right ventricular wall thickness. Right ventricular systolic function is normal. Left Atrium: Left atrial size was mildly dilated. Right Atrium: Right atrial size was normal in size. Pericardium: There is no evidence of pericardial effusion. Mitral Valve: The mitral valve is degenerative in appearance. Moderate mitral annular calcification. Mild mitral valve regurgitation. MV peak gradient, 3.2 mmHg. The mean mitral valve gradient is 1.0 mmHg. Tricuspid Valve: The tricuspid valve is normal in structure. Tricuspid valve regurgitation is mild. Aortic Valve: The aortic valve is tricuspid. Aortic valve regurgitation is not visualized. Mild aortic valve sclerosis is present, with no evidence of aortic valve stenosis. Aortic valve mean gradient measures 4.0 mmHg. Aortic valve peak gradient measures 6.4 mmHg. Aortic valve area, by VTI measures 3.18 cm. Pulmonic Valve: The pulmonic valve was normal in structure. Pulmonic valve regurgitation is not visualized.  Aorta: The aortic root is normal in size and structure. Venous: The inferior vena cava is dilated in size with less than 50% respiratory variability, suggesting right atrial pressure of 15 mmHg. IAS/Shunts: No atrial level shunt detected by color flow Doppler.  LEFT VENTRICLE PLAX 2D LVIDd:         2.69 cm  Diastology LVIDs:         2.00 cm  LV e' lateral:   6.42 cm/s LV PW:         2.10 cm  LV E/e' lateral: 9.3 LV IVS:        1.80 cm  LV e' medial:    4.79 cm/s LVOT diam:     2.30 cm  LV E/e' medial:  12.4 LV SV:         85 LV  SV Index:   49 LVOT Area:     4.15 cm  RIGHT VENTRICLE             IVC RV Basal diam:  3.20 cm     IVC diam: 2.22 cm RV S prime:     12.70 cm/s TAPSE (M-mode): 1.9 cm LEFT ATRIUM             Index       RIGHT ATRIUM           Index LA diam:        2.80 cm 1.61 cm/m  RA Area:     15.80 cm LA Vol (A2C):   60.2 ml 34.55 ml/m RA Volume:   40.20 ml  23.07 ml/m LA Vol (A4C):   47.4 ml 27.20 ml/m LA Biplane Vol: 54.4 ml 31.22 ml/m  AORTIC VALVE AV Area (Vmax):    3.16 cm AV Area (Vmean):   2.98 cm AV Area (VTI):     3.18 cm AV Vmax:           126.75 cm/s AV Vmean:          96.025 cm/s AV VTI:            0.267 m AV Peak Grad:      6.4 mmHg AV Mean Grad:      4.0 mmHg LVOT Vmax:         96.40 cm/s LVOT Vmean:        68.900 cm/s LVOT VTI:          0.204 m LVOT/AV VTI ratio: 0.77  AORTA Ao Root diam: 3.00 cm Ao Asc diam:  3.30 cm MITRAL VALVE MV Area (PHT): 3.56 cm    SHUNTS MV Peak grad:  3.2 mmHg    Systemic VTI:  0.20 m MV Mean grad:  1.0 mmHg    Systemic Diam: 2.30 cm MV Vmax:       0.89 m/s MV Vmean:      55.1 cm/s MV Decel Time: 213 msec MV E velocity: 59.50 cm/s MV A velocity: 54.65 cm/s MV E/A ratio:  1.09 [name] MD Electronically signed by [name] MD Signature Date/Time: 01/05/2020/6:20:34 PM    Final    US Abdomen Limited RUQ  Result Date: 01/05/2020 CLINICAL DATA:  Elevated LFTs, palpable abdominal abnormality EXAM: ULTRASOUND ABDOMEN LIMITED RIGHT UPPER QUADRANT  COMPARISON:  12/11/2013 FINDINGS: Gallbladder: Surgically absent Common bile duct: Diameter: 4 mm Liver: No focal lesion identified. Within normal limits in parenchymal echogenicity. Portal vein is patent on color Doppler imaging with normal direction of blood flow towards the liver. Other: Ill-defined 4.5 x 2.9 x 4.1 cm hypoechoic area is seen within the right anterior abdominal wall just lateral to the umbilicus, corresponding to the palpable abnormality. This likely reflects an umbilical hernia, with abdominal wall defect measuring 1.9 cm. CT may be useful if further evaluation is desired. IMPRESSION: 1. Probable periumbilical ventral hernia, corresponding to palpable abnormality. CT could be performed for further characterization. 2. Cholecystectomy. 3. Otherwise unremarkable exam. Electronically Signed   By: Randa Ngo M.D.   On: 01/05/2020 02:31      The results of significant diagnostics from this hospitalization (including imaging, microbiology, ancillary and laboratory) are listed below for reference.     Microbiology: Recent Results (from the past 240 hour(s))  Blood culture (routine x 2)     Status: Abnormal   Collection Time: 01/05/20 12:31 AM   Specimen: BLOOD  Result Value Ref  Range Status   Specimen Description BLOOD RIGHT FOREARM  Final   Special Requests   Final    BOTTLES DRAWN AEROBIC AND ANAEROBIC Blood Culture adequate volume   Culture  Setup Time   Final    GRAM POSITIVE COCCI CRITICAL RESULT CALLED TO, READ BACK BY AND VERIFIED WITH: T DANG PHARMD 1953 01/05/20 A BROWNING IN BOTH AEROBIC AND ANAEROBIC BOTTLES Performed at [facility], [place] [address], [place], [place] [address]    Culture STREPTOCOCCUS MITIS/ORALIS (A)  Final   Report Status 01/08/2020 FINAL  Final   Organism ID, Bacteria STREPTOCOCCUS MITIS/ORALIS  Final      Susceptibility   Streptococcus mitis/oralis - MIC*    PENICILLIN 1 INTERMEDIATE Intermediate     CEFTRIAXONE 1 SENSITIVE Sensitive      ERYTHROMYCIN 0.5 INTERMEDIATE Intermediate     LEVOFLOXACIN 2 SENSITIVE Sensitive     VANCOMYCIN 0.5 SENSITIVE Sensitive     * STREPTOCOCCUS MITIS/ORALIS  Blood Culture ID Panel (Reflexed)     Status: Abnormal   Collection Time: 01/05/20 12:31 AM  Result Value Ref Range Status   Enterococcus species NOT DETECTED NOT DETECTED Final   Listeria monocytogenes NOT DETECTED NOT DETECTED Final   Staphylococcus species NOT DETECTED NOT DETECTED Final   Staphylococcus aureus (BCID) NOT DETECTED NOT DETECTED Final   Streptococcus species DETECTED (A) NOT DETECTED Final    Comment: Not Enterococcus species, Streptococcus agalactiae, Streptococcus pyogenes, or Streptococcus pneumoniae. CRITICAL RESULT CALLED TO, READ BACK BY AND VERIFIED WITH: T DANG PHARMD 1953 01/05/20 A BROWNING    Streptococcus agalactiae NOT DETECTED NOT DETECTED Final   Streptococcus pneumoniae NOT DETECTED NOT DETECTED Final   Streptococcus pyogenes NOT DETECTED NOT DETECTED Final   Acinetobacter baumannii NOT DETECTED NOT DETECTED Final   Enterobacteriaceae species NOT DETECTED NOT DETECTED Final   Enterobacter cloacae complex NOT DETECTED NOT DETECTED Final   Escherichia coli NOT DETECTED NOT DETECTED Final   Klebsiella oxytoca NOT DETECTED NOT DETECTED Final   Klebsiella pneumoniae NOT DETECTED NOT DETECTED Final   Proteus species NOT DETECTED NOT DETECTED Final   Serratia marcescens NOT DETECTED NOT DETECTED Final   Haemophilus influenzae NOT DETECTED NOT DETECTED Final   Neisseria meningitidis NOT DETECTED NOT DETECTED Final   Pseudomonas aeruginosa NOT DETECTED NOT DETECTED Final   Candida albicans NOT DETECTED NOT DETECTED Final   Candida glabrata NOT DETECTED NOT DETECTED Final   Candida krusei NOT DETECTED NOT DETECTED Final   Candida parapsilosis NOT DETECTED NOT DETECTED Final   Candida tropicalis NOT DETECTED NOT DETECTED Final    Comment: Performed at [facility], [place] [address], [place],  [place] [address]  Blood culture (routine x 2)     Status: None   Collection Time: 01/05/20 12:59 AM   Specimen: BLOOD  Result Value Ref Range Status   Specimen Description BLOOD SITE NOT SPECIFIED  Final   Special Requests   Final    BOTTLES DRAWN AEROBIC ONLY Blood Culture results may not be optimal due to an inadequate volume of blood received in culture bottles   Culture   Final    NO GROWTH 5 DAYS Performed at [facility], [place] [address] [place], [place] [address]    Report Status 01/10/2020 FINAL  Final  SARS Coronavirus 2 by RT PCR (hospital order, performed in [facility] hospital lab) Nasopharyngeal Nasopharyngeal Swab     Status: None   Collection Time: 01/05/20  3:02 AM   Specimen:  Nasopharyngeal Swab  Result Value Ref Range Status   SARS Coronavirus 2 NEGATIVE NEGATIVE Final    Comment: (NOTE) SARS-CoV-2 target nucleic acids are NOT DETECTED. The SARS-CoV-2 RNA is generally detectable in upper and lower respiratory specimens during the acute phase of infection. The lowest concentration of SARS-CoV-2 viral copies this assay can detect is 250 copies / mL. A negative result does not preclude SARS-CoV-2 infection and should not be used as the sole basis for treatment or other patient management decisions.  A negative result may occur with improper specimen collection / handling, submission of specimen other than nasopharyngeal swab, presence of viral mutation(s) within the areas targeted by this assay, and inadequate number of viral copies (<250 copies / mL). A negative result must be combined with clinical observations, patient history, and epidemiological information. Fact Sheet for Patients:   StrictlyIdeas.no Fact Sheet for Healthcare Providers: BankingDealers.co.za This test is not yet approved or cleared  by the [place] FDA and has been authorized for detection and/or diagnosis of SARS-CoV-2 by FDA under an  Emergency Use Authorization (EUA).  This EUA will remain in effect (meaning this test can be used) for the duration of the COVID-19 declaration under Section 564(b)(1) of the Act, 21 U.S.C. section 360bbb-3(b)(1), unless the authorization is terminated or revoked sooner. Performed at [facility], [place] [address], [place], [place] [address]   Gastrointestinal Panel by PCR , Stool     Status: None   Collection Time: 01/05/20  2:15 PM   Specimen: Stool  Result Value Ref Range Status   Campylobacter species NOT DETECTED NOT DETECTED Final   Plesimonas shigelloides NOT DETECTED NOT DETECTED Final   Salmonella species NOT DETECTED NOT DETECTED Final   Yersinia enterocolitica NOT DETECTED NOT DETECTED Final   Vibrio species NOT DETECTED NOT DETECTED Final   Vibrio cholerae NOT DETECTED NOT DETECTED Final   Enteroaggregative E coli (EAEC) NOT DETECTED NOT DETECTED Final   Enteropathogenic E coli (EPEC) NOT DETECTED NOT DETECTED Final   Enterotoxigenic E coli (ETEC) NOT DETECTED NOT DETECTED Final   Shiga like toxin producing E coli (STEC) NOT DETECTED NOT DETECTED Final   Shigella/Enteroinvasive E coli (EIEC) NOT DETECTED NOT DETECTED Final   Cryptosporidium NOT DETECTED NOT DETECTED Final   Cyclospora cayetanensis NOT DETECTED NOT DETECTED Final   Entamoeba histolytica NOT DETECTED NOT DETECTED Final   Giardia lamblia NOT DETECTED NOT DETECTED Final   Adenovirus F40/41 NOT DETECTED NOT DETECTED Final   Astrovirus NOT DETECTED NOT DETECTED Final   Norovirus GI/GII NOT DETECTED NOT DETECTED Final   Rotavirus A NOT DETECTED NOT DETECTED Final   Sapovirus (I, II, IV, and V) NOT DETECTED NOT DETECTED Final    Comment: Performed at [facility], [place] [place], [place] [address]  C Difficile Quick Screen w PCR reflex     Status: None   Collection Time: 01/05/20  2:15 PM   Specimen: Stool  Result Value Ref Range Status   C Diff antigen NEGATIVE NEGATIVE Final   C Diff  toxin NEGATIVE NEGATIVE Final   C Diff interpretation No C. difficile detected.  Final    Comment: Performed at [facility], [place] [address] [place], [place] [address]  Culture, blood (routine x 2)     Status: None   Collection Time: 01/05/20  9:13 PM   Specimen: BLOOD LEFT HAND  Result Value Ref Range Status   Specimen Description BLOOD LEFT HAND  Final  Special Requests   Final    BOTTLES DRAWN AEROBIC ONLY Blood Culture results may not be optimal due to an inadequate volume of blood received in culture bottles   Culture   Final    NO GROWTH 5 DAYS Performed at [facility], [place] [address] [place], [place] [address]    Report Status 01/10/2020 FINAL  Final  Culture, blood (routine x 2)     Status: None   Collection Time: 01/05/20  9:24 PM   Specimen: BLOOD LEFT FOREARM  Result Value Ref Range Status   Specimen Description BLOOD LEFT FOREARM  Final   Special Requests   Final    BOTTLES DRAWN AEROBIC ONLY Blood Culture results may not be optimal due to an inadequate volume of blood received in culture bottles   Culture   Final    NO GROWTH 5 DAYS Performed at [facility], [place] [address], [place], [place] [address]    Report Status 01/10/2020 FINAL  Final  Culture, blood (Routine X 2) w Reflex to ID Panel     Status: None (Preliminary result)   Collection Time: 01/07/20  9:08 AM   Specimen: BLOOD  Result Value Ref Range Status   Specimen Description BLOOD LEFT ANTECUBITAL  Final   Special Requests   Final    BOTTLES DRAWN AEROBIC AND ANAEROBIC Blood Culture adequate volume   Culture   Final    NO GROWTH 3 DAYS Performed at [facility], [place] [address] [place], [place] [address]    Report Status PENDING  Incomplete  Culture, blood (Routine X 2) w Reflex to ID Panel     Status: None (Preliminary result)   Collection Time: 01/07/20  9:08 AM   Specimen: BLOOD  Result Value Ref Range Status   Specimen Description BLOOD RIGHT ANTECUBITAL  Final    Special Requests   Final    BOTTLES DRAWN AEROBIC AND ANAEROBIC Blood Culture results may not be optimal due to an inadequate volume of blood received in culture bottles   Culture   Final    NO GROWTH 3 DAYS Performed at [facility], [place] [address], [place], [place] [address]    Report Status PENDING  Incomplete     Labs: BNP (last 3 results) No results for input(s): BNP in the last 8760 hours. Basic Metabolic Panel: Recent Labs  Lab 01/06/20 0135 01/07/20 0213 01/08/20 0726 01/09/20 0408 01/10/20 0323 01/10/20 0856  NA 135 137 140 138  --  138  K 3.1* 4.6 3.3* 3.4*  --  3.5  CL 104 105 107 103  --  104  CO2 22 24 25 23   --  25  GLUCOSE 88 96 109* 109*  --  112*  BUN <5* <5* <5* 5*  --  5*  CREATININE 0.59 0.72 0.83 0.79  --  0.64  CALCIUM 7.6* 9.0 9.2 8.9  --  9.0  MG 1.6* 2.0 1.4* 1.4* 1.6*  --    Liver Function Tests: Recent Labs  Lab 01/05/20 0824 01/06/20 0135 01/07/20 0213 01/08/20 0726 01/09/20 0408  AST 98* 68* 74* 53* 53*  ALT 56* 47* 49* 39 39  ALKPHOS 119 94 115 89 91  BILITOT 2.3* 1.6* 1.2 0.9 0.7  PROT 6.7 5.9* 6.9 5.9* 5.7*  ALBUMIN 2.9* 2.6* 3.2* 2.7* 2.7*   Recent Labs  Lab 01/04/20 2222  LIPASE 23   Recent Labs  Lab 01/06/20 1110 01/08/20 0726  AMMONIA 51* 35  CBC: Recent Labs  Lab 01/04/20 2222 01/04/20 2222 01/05/20 0824 01/05/20 0824 01/06/20 0135 01/06/20 1636 01/07/20 0213 01/08/20 0726 01/09/20 0408 01/10/20 0323  WBC 4.5   < > 6.8   < > 5.8  --  6.4 5.9 7.4 7.8  NEUTROABS 3.0  --  4.0  --   --   --   --   --   --   --   HGB 15.6*   < > 13.6   < > 12.3  --  13.6 11.4* 11.2* 11.7*  HCT 48.3*   < > 42.9   < > 38.8 41.7 43.6 36.8 36.0 37.0  MCV 80.0   < > 81.4   < > 81.2  --  81.8 82.7 82.0 83.0  PLT 97*   < > PLATELET CLUMPS NOTED ON SMEAR, UNABLE TO ESTIMATE   < > 81*  --  87* 110* 134* 196   < > = values in this interval not displayed.   Cardiac Enzymes: Recent Labs  Lab 01/05/20 0824  CKTOTAL 155    BNP: Invalid input(s): POCBNP CBG: No results for input(s): GLUCAP in the last 168 hours. D-Dimer No results for input(s): DDIMER in the last 72 hours. Hgb A1c No results for input(s): HGBA1C in the last 72 hours. Lipid Profile No results for input(s): CHOL, HDL, LDLCALC, TRIG, CHOLHDL, LDLDIRECT in the last 72 hours. Thyroid function studies No results for input(s): TSH, T4TOTAL, T3FREE, THYROIDAB in the last 72 hours.  Invalid input(s): FREET3 Anemia work up No results for input(s): VITAMINB12, FOLATE, FERRITIN, TIBC, IRON, RETICCTPCT in the last 72 hours. Urinalysis    Component Value Date/Time   COLORURINE AMBER (A) 01/04/2020 2252   APPEARANCEUR HAZY (A) 01/04/2020 2252   LABSPEC 1.030 01/04/2020 2252   PHURINE 6.0 01/04/2020 2252   GLUCOSEU NEGATIVE 01/04/2020 2252   HGBUR SMALL (A) 01/04/2020 2252   BILIRUBINUR SMALL (A) 01/04/2020 2252   KETONESUR 5 (A) 01/04/2020 2252   PROTEINUR >=300 (A) 01/04/2020 2252   UROBILINOGEN 1.0 10/15/2011 1410   NITRITE NEGATIVE 01/04/2020 2252   LEUKOCYTESUR NEGATIVE 01/04/2020 2252   Sepsis Labs Invalid input(s): PROCALCITONIN,  WBC,  LACTICIDVEN Microbiology Recent Results (from the past 240 hour(s))  Blood culture (routine x 2)     Status: Abnormal   Collection Time: 01/05/20 12:31 AM   Specimen: BLOOD  Result Value Ref Range Status   Specimen Description BLOOD RIGHT FOREARM  Final   Special Requests   Final    BOTTLES DRAWN AEROBIC AND ANAEROBIC Blood Culture adequate volume   Culture  Setup Time   Final    GRAM POSITIVE COCCI CRITICAL RESULT CALLED TO, READ BACK BY AND VERIFIED WITH: T DANG PHARMD 1953 01/05/20 A BROWNING IN BOTH AEROBIC AND ANAEROBIC BOTTLES Performed at [facility], [place] [address] [place], [place] [address]    Culture STREPTOCOCCUS MITIS/ORALIS (A)  Final   Report Status 01/08/2020 FINAL  Final   Organism ID, Bacteria STREPTOCOCCUS MITIS/ORALIS  Final      Susceptibility   Streptococcus  mitis/oralis - MIC*    PENICILLIN 1 INTERMEDIATE Intermediate     CEFTRIAXONE 1 SENSITIVE Sensitive     ERYTHROMYCIN 0.5 INTERMEDIATE Intermediate     LEVOFLOXACIN 2 SENSITIVE Sensitive     VANCOMYCIN 0.5 SENSITIVE Sensitive     * STREPTOCOCCUS MITIS/ORALIS  Blood Culture ID Panel (Reflexed)     Status: Abnormal   Collection Time: 01/05/20 12:31 AM  Result Value Ref Range Status  Enterococcus species NOT DETECTED NOT DETECTED Final   Listeria monocytogenes NOT DETECTED NOT DETECTED Final   Staphylococcus species NOT DETECTED NOT DETECTED Final   Staphylococcus aureus (BCID) NOT DETECTED NOT DETECTED Final   Streptococcus species DETECTED (A) NOT DETECTED Final    Comment: Not Enterococcus species, Streptococcus agalactiae, Streptococcus pyogenes, or Streptococcus pneumoniae. CRITICAL RESULT CALLED TO, READ BACK BY AND VERIFIED WITH: T DANG PHARMD 1953 01/05/20 A BROWNING    Streptococcus agalactiae NOT DETECTED NOT DETECTED Final   Streptococcus pneumoniae NOT DETECTED NOT DETECTED Final   Streptococcus pyogenes NOT DETECTED NOT DETECTED Final   Acinetobacter baumannii NOT DETECTED NOT DETECTED Final   Enterobacteriaceae species NOT DETECTED NOT DETECTED Final   Enterobacter cloacae complex NOT DETECTED NOT DETECTED Final   Escherichia coli NOT DETECTED NOT DETECTED Final   Klebsiella oxytoca NOT DETECTED NOT DETECTED Final   Klebsiella pneumoniae NOT DETECTED NOT DETECTED Final   Proteus species NOT DETECTED NOT DETECTED Final   Serratia marcescens NOT DETECTED NOT DETECTED Final   Haemophilus influenzae NOT DETECTED NOT DETECTED Final   Neisseria meningitidis NOT DETECTED NOT DETECTED Final   Pseudomonas aeruginosa NOT DETECTED NOT DETECTED Final   Candida albicans NOT DETECTED NOT DETECTED Final   Candida glabrata NOT DETECTED NOT DETECTED Final   Candida krusei NOT DETECTED NOT DETECTED Final   Candida parapsilosis NOT DETECTED NOT DETECTED Final   Candida tropicalis NOT  DETECTED NOT DETECTED Final    Comment: Performed at [facility], [place]. [address], [place], [place] [address]  Blood culture (routine x 2)     Status: None   Collection Time: 01/05/20 12:59 AM   Specimen: BLOOD  Result Value Ref Range Status   Specimen Description BLOOD SITE NOT SPECIFIED  Final   Special Requests   Final    BOTTLES DRAWN AEROBIC ONLY Blood Culture results may not be optimal due to an inadequate volume of blood received in culture bottles   Culture   Final    NO GROWTH 5 DAYS Performed at [facility], [place] [address], [place], [place] [address]    Report Status 01/10/2020 FINAL  Final  SARS Coronavirus 2 by RT PCR (hospital order, performed in [facility] hospital lab) Nasopharyngeal Nasopharyngeal Swab     Status: None   Collection Time: 01/05/20  3:02 AM   Specimen: Nasopharyngeal Swab  Result Value Ref Range Status   SARS Coronavirus 2 NEGATIVE NEGATIVE Final    Comment: (NOTE) SARS-CoV-2 target nucleic acids are NOT DETECTED. The SARS-CoV-2 RNA is generally detectable in upper and lower respiratory specimens during the acute phase of infection. The lowest concentration of SARS-CoV-2 viral copies this assay can detect is 250 copies / mL. A negative result does not preclude SARS-CoV-2 infection and should not be used as the sole basis for treatment or other patient management decisions.  A negative result may occur with improper specimen collection / handling, submission of specimen other than nasopharyngeal swab, presence of viral mutation(s) within the areas targeted by this assay, and inadequate number of viral copies (<250 copies / mL). A negative result must be combined with clinical observations, patient history, and epidemiological information. Fact Sheet for Patients:   StrictlyIdeas.no Fact Sheet for Healthcare Providers: BankingDealers.co.za This test is not yet approved or cleared  by the  [place] FDA and has been authorized for detection and/or diagnosis of SARS-CoV-2 by FDA under an Emergency Use Authorization (EUA).  This EUA will remain in effect (  meaning this test can be used) for the duration of the COVID-19 declaration under Section 564(b)(1) of the Act, 21 U.S.C. section 360bbb-3(b)(1), unless the authorization is terminated or revoked sooner. Performed at [facility], [place] [address], [place], [place] [address]   Gastrointestinal Panel by PCR , Stool     Status: None   Collection Time: 01/05/20  2:15 PM   Specimen: Stool  Result Value Ref Range Status   Campylobacter species NOT DETECTED NOT DETECTED Final   Plesimonas shigelloides NOT DETECTED NOT DETECTED Final   Salmonella species NOT DETECTED NOT DETECTED Final   Yersinia enterocolitica NOT DETECTED NOT DETECTED Final   Vibrio species NOT DETECTED NOT DETECTED Final   Vibrio cholerae NOT DETECTED NOT DETECTED Final   Enteroaggregative E coli (EAEC) NOT DETECTED NOT DETECTED Final   Enteropathogenic E coli (EPEC) NOT DETECTED NOT DETECTED Final   Enterotoxigenic E coli (ETEC) NOT DETECTED NOT DETECTED Final   Shiga like toxin producing E coli (STEC) NOT DETECTED NOT DETECTED Final   Shigella/Enteroinvasive E coli (EIEC) NOT DETECTED NOT DETECTED Final   Cryptosporidium NOT DETECTED NOT DETECTED Final   Cyclospora cayetanensis NOT DETECTED NOT DETECTED Final   Entamoeba histolytica NOT DETECTED NOT DETECTED Final   Giardia lamblia NOT DETECTED NOT DETECTED Final   Adenovirus F40/41 NOT DETECTED NOT DETECTED Final   Astrovirus NOT DETECTED NOT DETECTED Final   Norovirus GI/GII NOT DETECTED NOT DETECTED Final   Rotavirus A NOT DETECTED NOT DETECTED Final   Sapovirus (I, II, IV, and V) NOT DETECTED NOT DETECTED Final    Comment: Performed at [facility], [address] [place], [place] [address]  C Difficile Quick Screen w PCR reflex     Status: None   Collection Time: 01/05/20  2:15  PM   Specimen: Stool  Result Value Ref Range Status   C Diff antigen NEGATIVE NEGATIVE Final   C Diff toxin NEGATIVE NEGATIVE Final   C Diff interpretation No C. difficile detected.  Final    Comment: Performed at [facility], [place] [address], [place], [place] [address]  Culture, blood (routine x 2)     Status: None   Collection Time: 01/05/20  9:13 PM   Specimen: BLOOD LEFT HAND  Result Value Ref Range Status   Specimen Description BLOOD LEFT HAND  Final   Special Requests   Final    BOTTLES DRAWN AEROBIC ONLY Blood Culture results may not be optimal due to an inadequate volume of blood received in culture bottles   Culture   Final    NO GROWTH 5 DAYS Performed at [facility], [place] [address] [place], [place] [address]    Report Status 01/10/2020 FINAL  Final  Culture, blood (routine x 2)     Status: None   Collection Time: 01/05/20  9:24 PM   Specimen: BLOOD LEFT FOREARM  Result Value Ref Range Status   Specimen Description BLOOD LEFT FOREARM  Final   Special Requests   Final    BOTTLES DRAWN AEROBIC ONLY Blood Culture results may not be optimal due to an inadequate volume of blood received in culture bottles   Culture   Final    NO GROWTH 5 DAYS Performed at [facility], [place] [address], [place], [place] [address]    Report Status 01/10/2020 FINAL  Final  Culture, blood (Routine X 2) w Reflex to ID Panel     Status: None (Preliminary result)  Collection Time: 01/07/20  9:08 AM   Specimen: BLOOD  Result Value Ref Range Status   Specimen Description BLOOD LEFT ANTECUBITAL  Final   Special Requests   Final    BOTTLES DRAWN AEROBIC AND ANAEROBIC Blood Culture adequate volume   Culture   Final    NO GROWTH 3 DAYS Performed at [facility], [address] [place], [place] [address]    Report Status PENDING  Incomplete  Culture, blood (Routine X 2) w Reflex to ID Panel     Status: None (Preliminary result)   Collection Time: 01/07/20  9:08 AM    Specimen: BLOOD  Result Value Ref Range Status   Specimen Description BLOOD RIGHT ANTECUBITAL  Final   Special Requests   Final    BOTTLES DRAWN AEROBIC AND ANAEROBIC Blood Culture results may not be optimal due to an inadequate volume of blood received in culture bottles   Culture   Final    NO GROWTH 3 DAYS Performed at [facility], [place] [address], [place], [place] [address]    Report Status PENDING  Incomplete     Time coordinating discharge:  I have spent 35 minutes face to face with the patient and on the ward discussing the patients care, assessment, plan and disposition with other care givers. >50% of the time was devoted counseling the patient about the risks and benefits of treatment/Discharge disposition and coordinating care.   SIGNED:   Damita Lack, MD  [facility] 01/10/2020, 10:14 AM   If 7PM-7AM, please contact night-coverage

## 2020-01-10 NOTE — TOC Transition Note (Signed)
Transition of Care [facility]) - CM/SW Discharge Note   Patient Details  Name: Marissa Weber MRN: [id number] Date of Birth: 04-12-1961  Transition of Care [facility]) CM/SW Contact:  Claudie Leach, RN 01/10/2020, 3:14 PM   Clinical Narrative:    [facility] declined patient referral for [facility].  When called to discuss with patient, she stated she doesn't want to pursue [place] at this time and will contact her PMD if she changes her mind. Advised patient she can do outpatient therapy also if desired.    Final next level of care: Home/Self Care Barriers to Discharge: No Barriers Identified   Patient Goals and CMS Choice        Discharge Placement                       Discharge Plan and Services                                     Social Determinants of Health (SDOH) Interventions     Readmission Risk Interventions No flowsheet data found.

## 2020-01-10 NOTE — Progress Notes (Signed)
Pt getting ready for DC to home with family.  DC instructions reviewed and copy given to pt.

## 2020-01-10 NOTE — Discharge Instructions (Signed)
Alcohol Abuse and Dependence Information, Adult Alcohol is a widely available drug. People drink alcohol in different amounts. People who drink alcohol very often and in large amounts often have problems during and after drinking. They may develop what is called an alcohol use disorder. There are two main types of alcohol use disorders:  Alcohol abuse. This is when you use alcohol too much or too often. You may use alcohol to make yourself feel happy or to reduce stress. You may have a hard time setting a limit on the amount you drink.  Alcohol dependence. This is when you use alcohol consistently for a period of time, and your body changes as a result. This can make it hard to stop drinking because you may start to feel sick or feel different when you do not use alcohol. These symptoms are known as withdrawal. How can alcohol abuse and dependence affect me? Alcohol abuse and dependence can have a negative effect on your life. Drinking too much can lead to addiction. You may feel like you need alcohol to function normally. You may drink alcohol before work in the morning, during the day, or as soon as you get home from work in the evening. These actions can result in:  Poor work performance.  Job loss.  Financial problems.  Car crashes or criminal charges from driving after drinking alcohol.  Problems in your relationships with friends and family.  Losing the trust and respect of coworkers, friends, and family. Drinking heavily over a long period of time can permanently damage your body and brain, and can cause lifelong health issues, such as:  Damage to your liver or pancreas.  Heart problems, high blood pressure, or stroke.  Certain cancers.  Decreased ability to fight infections.  Brain or nerve damage.  Depression.  Early (premature) death. If you are careless or you crave alcohol, it is easy to drink more than your body can handle (overdose). Alcohol overdose is a serious  situation that requires hospitalization. It may lead to permanent injuries or death. What can increase my risk?  Having a family history of alcohol abuse.  Having depression or other mental health conditions.  Beginning to drink at an early age.  Binge drinking often.  Experiencing trauma, stress, and an unstable home life during childhood.  Spending time with people who drink often. What actions can I take to prevent or manage alcohol abuse and dependence?  Do not drink alcohol if: ? Your health care provider tells you not to drink. ? You are pregnant, may be pregnant, or are planning to become pregnant.  If you drink alcohol: ? Limit how much you use to:  0-1 drink a day for women.  0-2 drinks a day for men. ? Be aware of how much alcohol is in your drink. In the [place] one drink equals one 12 oz bottle of beer (355 mL), one 5 oz glass of wine (148 mL), or one 1 oz glass of hard liquor (44 mL).  Stop drinking if you have been drinking too much. This can be very hard to do if you are used to abusing alcohol. If you begin to have withdrawal symptoms, talk with your health care provider or a person that you trust. These symptoms may include anxiety, shaky hands, headache, nausea, sweating, or not being able to sleep.  Choose to drink nonalcoholic beverages in social gatherings and places where there may be alcohol. Activity  Spend more time on activities that you enjoy that do   not involve alcohol, like hobbies or exercise.  Find healthy ways to cope with stress, such as exercise, meditation, or spending time with people you care about. General information  Talk to your family, coworkers, and friends about supporting you in your efforts to stop drinking. If they drink, ask them not to drink around you. Spend more time with people who do not drink alcohol.  If you think that you have an alcohol dependency problem: ? Tell friends or family about your concerns. ? Talk with your  health care provider or another health professional about where to get help. ? Work with a therapist and a chemical dependency counselor. ? Consider joining a support group for people who struggle with alcohol abuse and dependence. Where to find support   Your health care provider.  SMART Recovery: www.smartrecovery.org Therapy and support groups  Local treatment centers or chemical dependency counselors.  Local AA groups in your community: www.aa.org Where to find more information  Centers for Disease Control and Prevention: www.cdc.gov  National Institute on Alcohol Abuse and Alcoholism: www.niaaa.nih.gov  Alcoholics Anonymous (AA): www.aa.org Contact a health care provider if:  You drank more or for longer than you intended on more than one occasion.  You tried to stop drinking or to cut back on how much you drink, but you were not able to.  You often drink to the point of vomiting or passing out.  You want to drink so badly that you cannot think about anything else.  You have problems in your life due to drinking, but you continue to drink.  You keep drinking even though you feel anxious, depressed, or have experienced memory loss.  You have stopped doing the things you used to enjoy in order to drink.  You have to drink more than you used to in order to get the effect you want.  You experience anxiety, sweating, nausea, shakiness, and trouble sleeping when you try to stop drinking. Get help right away if:  You have thoughts about hurting yourself or others.  You have serious withdrawal symptoms, including: ? Confusion. ? Racing heart. ? High blood pressure. ? Fever. If you ever feel like you may hurt yourself or others, or have thoughts about taking your own life, get help right away. You can go to your nearest emergency department or call:  Your local emergency services (911 in the [place]  A suicide crisis helpline, such as the National Suicide Prevention  Lifeline at 1-800-273-8255. This is open 24 hours a day. Summary  Alcohol abuse and dependence can have a negative effect on your life. Drinking too much or too often can lead to addiction.  If you drink alcohol, limit how much you use.  If you are having trouble keeping your drinking under control, find ways to change your behavior. Hobbies, calming activities, exercise, or support groups can help.  If you feel you need help with changing your drinking habits, talk with your health care provider, a good friend, or a therapist, or go to an AA group. This information is not intended to replace advice given to you by your health care provider. Make sure you discuss any questions you have with your health care provider. Document Revised: 12/02/2018 Document Reviewed: 10/21/2018 Elsevier Patient Education  2020 Elsevier Inc.  

## 2020-01-10 NOTE — Progress Notes (Signed)
Patient contacted to discuss referral for [facility]. Patient declined [place] follow up coordination at this time stating that she is not sure what family member she will be living  with in order to have the service set up. Patient states that once she is settled, she will contact her primary care doctor for a referral for home health if needed.    [address] LCSW Transitions of Care [phone]

## 2020-01-12 LAB — CULTURE, BLOOD (ROUTINE X 2)
Culture: NO GROWTH
Culture: NO GROWTH
Special Requests: ADEQUATE

## 2020-06-18 ENCOUNTER — Emergency Department (HOSPITAL_COMMUNITY): Payer: Medicaid Other

## 2020-06-18 ENCOUNTER — Other Ambulatory Visit: Payer: Self-pay

## 2020-06-18 ENCOUNTER — Emergency Department (HOSPITAL_COMMUNITY)
Admission: EM | Admit: 2020-06-18 | Discharge: 2020-06-18 | Disposition: A | Discharge status: Home / Self Care | Payer: Medicaid Other | Attending: Emergency Medicine | Admitting: Emergency Medicine

## 2020-06-18 DIAGNOSIS — J45909 Unspecified asthma, uncomplicated: Secondary | ICD-10-CM | POA: Insufficient documentation

## 2020-06-18 DIAGNOSIS — G8929 Other chronic pain: Secondary | ICD-10-CM | POA: Diagnosis not present

## 2020-06-18 DIAGNOSIS — F17210 Nicotine dependence, cigarettes, uncomplicated: Secondary | ICD-10-CM | POA: Diagnosis not present

## 2020-06-18 DIAGNOSIS — I10 Essential (primary) hypertension: Secondary | ICD-10-CM | POA: Insufficient documentation

## 2020-06-18 DIAGNOSIS — Z7982 Long term (current) use of aspirin: Secondary | ICD-10-CM | POA: Diagnosis not present

## 2020-06-18 DIAGNOSIS — M545 Low back pain, unspecified: Secondary | ICD-10-CM | POA: Diagnosis not present

## 2020-06-18 DIAGNOSIS — Z79899 Other long term (current) drug therapy: Secondary | ICD-10-CM | POA: Diagnosis not present

## 2020-06-18 DIAGNOSIS — Z96642 Presence of left artificial hip joint: Secondary | ICD-10-CM | POA: Insufficient documentation

## 2020-06-18 LAB — CBC
HCT: 40.9 % (ref 36.0–46.0)
Hemoglobin: 12.8 g/dL (ref 12.0–15.0)
MCH: 25.9 pg — ABNORMAL LOW (ref 26.0–34.0)
MCHC: 31.3 g/dL (ref 30.0–36.0)
MCV: 82.8 fL (ref 80.0–100.0)
Platelets: 233 10*3/uL (ref 150–400)
RBC: 4.94 MIL/uL (ref 3.87–5.11)
RDW: 17.6 % — ABNORMAL HIGH (ref 11.5–15.5)
WBC: 6.9 10*3/uL (ref 4.0–10.5)
nRBC: 0 % (ref 0.0–0.2)

## 2020-06-18 LAB — BASIC METABOLIC PANEL
Anion gap: 13 (ref 5–15)
BUN: 6 mg/dL (ref 6–20)
CO2: 22 mmol/L (ref 22–32)
Calcium: 9.2 mg/dL (ref 8.9–10.3)
Chloride: 103 mmol/L (ref 98–111)
Creatinine, Ser: 0.71 mg/dL (ref 0.44–1.00)
GFR, Estimated: >60 mL/min (ref >60)
Glucose, Bld: 105 mg/dL — ABNORMAL HIGH (ref 70–99)
Potassium: 3.8 mmol/L (ref 3.5–5.1)
Sodium: 138 mmol/L (ref 135–145)

## 2020-06-18 MED ORDER — KETOROLAC TROMETHAMINE 15 MG/ML IJ SOLN
15.0000 mg | Freq: Once | INTRAMUSCULAR | Status: AC
  Administered 2020-06-18: 15 mg via INTRAMUSCULAR
  Filled 2020-06-18: qty 1

## 2020-06-18 MED ORDER — KETOROLAC TROMETHAMINE 15 MG/ML IJ SOLN: 15.0000 mg | Freq: Once | INTRAMUSCULAR | Status: DC

## 2020-06-18 MED ORDER — LACTATED RINGERS IV BOLUS
500.0000 mL | Freq: Once | INTRAVENOUS | Status: AC
  Administered 2020-06-18: 500 mL via INTRAVENOUS

## 2020-06-18 MED ORDER — IOHEXOL 350 MG/ML SOLN
100.0000 mL | Freq: Once | INTRAVENOUS | Status: AC | PRN
  Administered 2020-06-18: 100 mL via INTRAVENOUS

## 2020-06-18 MED ORDER — LIDOCAINE 5 % EX PTCH
1.0000 | MEDICATED_PATCH | CUTANEOUS | Status: DC
  Filled 2020-06-18: qty 1

## 2020-06-18 MED ORDER — CYCLOBENZAPRINE HCL 10 MG PO TABS
5.0000 mg | ORAL_TABLET | Freq: Once | ORAL | Status: AC
  Administered 2020-06-18: 5 mg via ORAL
  Filled 2020-06-18: qty 1

## 2020-06-18 NOTE — ED Notes (Signed)
EDP to the bedside to assess pt due to hypotension. Pt remains alert and oriented. NAD noted.

## 2020-06-18 NOTE — ED Notes (Signed)
Patient transported to X-ray 

## 2020-06-18 NOTE — ED Notes (Signed)
Patient verbalizes understanding of discharge instructions. Opportunity for questioning and answers were provided. Armband removed by staff, pt discharged from ED stable & ambulatory  

## 2020-06-18 NOTE — Discharge Instructions (Signed)
Xray pelvis without acute abnormality. CTA chest abdomen and pelvis obtained which showed no evidence of acute abnormality.

## 2020-06-18 NOTE — ED Triage Notes (Signed)
Pt fell today straight on back at 1430 pm. Pt reports no loc.  Pt felt sob when getting up. Pt still experiencing back pain, reports breathing a bit better.

## 2020-06-18 NOTE — ED Provider Notes (Signed)
[facility] EMERGENCY DEPARTMENT Provider Note   CSN: [id number] Arrival date & time: 06/18/20  1508     History Chief Complaint  Patient presents with  . Back Pain    Marissa Weber is a 59 y.o. female.  The history is provided by the patient and the spouse.  Trauma Mechanism of injury: fall Injury location: pelvis Injury location detail: R hip and L hip Incident location: home   Fall:      Fall occurred: walking  EMS/PTA data:      Loss of consciousness: no  Current symptoms:      Pain timing: intermittent      Associated symptoms:            Reports back pain.            Denies abdominal pain, blindness, chest pain, difficulty breathing, headache, loss of consciousness, nausea, neck pain and vomiting.   Relevant PMH:      Medical risk factors:            COPD.       Pharmacological risk factors:            No anticoagulation therapy.       Past Medical History:  Diagnosis Date  . Arthritis    hip  and knees   . Asthma    hx of years ago   . Atrial fibrillation ([place]) 11/2016  . Blood transfusion    hx of years ago   . Cholecystitis   . Dysrhythmia    a fib  . Hypertension     Patient Active Problem List   Diagnosis Date Noted  . Acute gastroenteritis 01/05/2020  . Nausea vomiting and diarrhea 01/05/2020  . Hypokalemia due to excessive gastrointestinal loss of potassium 01/05/2020  . Hypomagnesemia 01/05/2020  . SIRS (systemic inflammatory response syndrome) ([place]) 01/05/2020  . Orthostatic syncope 01/05/2020  . Abnormal LFTs 01/05/2020  . Lactic acidosis 01/05/2020  . Intractable nausea and vomiting 01/05/2020  . AF (paroxysmal atrial fibrillation) ([place]) 12/16/2016  . Hyponatremia 12/16/2016  . Anemia in other chronic diseases classified elsewhere 12/16/2016  . Leukocytosis 12/16/2016  . Closed left hip fracture ([place]) 12/16/2016  . Closed displaced fracture of greater trochanter of left femur ([place])   . OA (osteoarthritis)  of hip 12/05/2016  . Atrial fibrillation with RVR ([place]) 11/27/2016  . Anxiety 11/27/2016  . Left hip pain 11/27/2016  . Angioedema 04/16/2016  . Cholecystitis, acute with cholelithiasis 12/11/2013  . Postop Acute blood loss anemia 10/25/2011  . Osteoarthritis of hip 10/22/2011    Past Surgical History:  Procedure Laterality Date  . ANKLE SURGERY    . BACK SURGERY     SPINAL FUSION  . CESAREAN SECTION    . cesearan     x4  . CHOLECYSTECTOMY N/A 12/11/2013   Procedure: LAPAROSCOPIC CHOLECYSTECTOMY WITH INTRAOPERATIVE CHOLANGIOGRAM;  Surgeon: Imogene Burn. Georgette Dover, MD;  Location: [facility];  Service: General;  Laterality: N/A;  . LEFT HEART CATH AND CORONARY ANGIOGRAPHY N/A 11/29/2016   Procedure: Left Heart Cath and Coronary Angiography;  Surgeon: [name], MD;  Location: [facility];  Service: Cardiovascular;  Laterality: N/A;  . TOTAL HIP ARTHROPLASTY  10/22/2011   Procedure: TOTAL HIP ARTHROPLASTY;  Surgeon: Gearlean Alf, MD;  Location: [facility];  Service: Orthopedics;  Laterality: Right;  . TOTAL HIP ARTHROPLASTY Left 12/05/2016   Procedure: LEFT TOTAL HIP ARTHROPLASTY ANTERIOR APPROACH;  Surgeon: Gaynelle Arabian, MD;  Location: [facility];  Service: Orthopedics;  Laterality: Left;     OB History    Gravida  4   Para  4   Term  4   Preterm      AB      Living  5     SAB      TAB      Ectopic      Multiple      Live Births              No family history on file.  Social History   Tobacco Use  . Smoking status: Current Every Day Smoker    Packs/day: 0.50    Years: 10.00    Pack years: 5.00    Types: Cigarettes  . Smokeless tobacco: Never Used  Substance Use Topics  . Alcohol use: Yes    Alcohol/week: 5.0 standard drinks    Types: 4 Cans of beer, 1 Glasses of wine per week    Comment: ocassionally  . Drug use: No    Home Medications Prior to Admission medications   Medication Sig Start Date End Date Taking? Authorizing Provider  albuterol  (PROVENTIL HFA;VENTOLIN HFA) 108 (90 Base) MCG/ACT inhaler Inhale 2 puffs into the lungs every 4 (four) hours as needed for wheezing or shortness of breath.    [provider]  amLODipine (NORVASC) 5 MG tablet Take 5 mg by mouth daily. 09/01/19   [provider]  aspirin EC 81 MG tablet Take 81 mg by mouth daily.    [provider]  folic acid (FOLVITE) 1 MG tablet Take 1 tablet (1 mg total) by mouth daily. 01/10/20   Amin, Jeanella Flattery, MD  methocarbamol (ROBAXIN) 500 MG tablet Take 1 tablet (500 mg total) by mouth every 6 (six) hours as needed for muscle spasms. Patient not taking: Reported on 01/05/2020 12/06/16   Dara Lords, Alexzandrew L, PA-C  metoprolol tartrate (LOPRESSOR) 25 MG tablet Take 1 tablet (25 mg total) by mouth 2 (two) times daily. 11/30/16   [name], MD  Multiple Vitamin (MULTIVITAMIN WITH MINERALS) TABS tablet Take 1 tablet by mouth daily. 01/10/20   Amin, Ankit Chirag, MD  nicotine (NICODERM CQ - DOSED IN MG/24 HOURS) 14 mg/24hr patch Place 1 patch (14 mg total) onto the skin daily. 01/10/20   Amin, Jeanella Flattery, MD  tiZANidine (ZANAFLEX) 2 MG tablet Take 2 mg by mouth at bedtime as needed for muscle spasms.    [provider]    Allergies    Bee venom and Lisinopril  Review of Systems   Review of Systems  Constitutional: Negative for chills and fever.  HENT: Negative for congestion and sore throat.   Eyes: Negative for blindness.  Respiratory: Negative for cough and shortness of breath.   Cardiovascular: Positive for leg swelling. Negative for chest pain.       BL foot swelling  Gastrointestinal: Negative for abdominal pain, nausea and vomiting.       Swelling on R mid abdomen  Musculoskeletal: Positive for back pain. Negative for neck pain.  Neurological: Negative for loss of consciousness and headaches.  All other systems reviewed and are negative.   Physical Exam Updated Vital Signs BP 116/77 (BP Location: Right Arm)   Pulse 100    Temp 98.6 F (37 C) (Oral)   Resp 18   Ht 4\' 11"  (1.499 m)   Wt 83.9 kg   SpO2 98%   BMI 37.37 kg/m   Physical Exam Vitals reviewed.  Constitutional:      Appearance: Normal appearance.  HENT:     Head: Normocephalic and atraumatic.     Nose: Nose normal.     Mouth/Throat:     Mouth: Mucous membranes are moist.     Pharynx: Oropharynx is clear.  Eyes:     Conjunctiva/sclera: Conjunctivae normal.  Cardiovascular:     Rate and Rhythm: Normal rate.     Heart sounds: Normal heart sounds.  Pulmonary:     Effort: Pulmonary effort is normal. No respiratory distress.     Breath sounds: Normal breath sounds.  Abdominal:     General: Abdomen is flat.     Palpations: Abdomen is soft. There is mass.     Comments: Soft mass to R of umbilicus  Musculoskeletal:        General: Swelling present.     Cervical back: Normal range of motion and neck supple. No tenderness.     Right lower leg: No edema.     Left lower leg: No edema.     Comments: BL pedal swelling and ankle swelling No midline spinal tenderness Tender over bilateral iliac crests  Skin:    General: Skin is warm and dry.  Neurological:     Mental Status: She is alert.  Psychiatric:        Mood and Affect: Mood normal.        Behavior: Behavior normal.     ED Results / Procedures / Treatments   Labs (all labs ordered are listed, but only abnormal results are displayed) Labs Reviewed  BASIC METABOLIC PANEL - Abnormal; Notable for the following components:      Result Value   Glucose, Bld 105 (*)    All other components within normal limits  CBC - Abnormal; Notable for the following components:   MCH 25.9 (*)    RDW 17.6 (*)    All other components within normal limits    EKG EKG Interpretation  Date/Time:  Saturday June 18 2020 15:29:20 EDT Ventricular Rate:  89 PR Interval:  164 QRS Duration: 96 QT Interval:  390 QTC Calculation: 474 R Axis:   67 Text Interpretation: Normal sinus rhythm Low  voltage QRS Cannot rule out Anterior infarct , age undetermined Abnormal ECG When comapred to prior, similar apperance. No STEMI Confirmed by Antony Blackbird [phone]) on 06/18/2020 4:16:26 PM   Radiology DG Pelvis 1-2 Views  Result Date: 06/18/2020 CLINICAL DATA:  Pelvic pain following fall. EXAM: PELVIS - 1-2 VIEW COMPARISON:  12/05/2016 FINDINGS: Bilateral total hip arthroplasties are identified. No acute fracture, subluxation or dislocation identified. No focal bony lesions are present. IMPRESSION: No acute abnormality. Electronically Signed   By: Margarette Canada M.D.   On: 06/18/2020 17:53   CT Angio Chest/Abd/Pel for Dissection W and/or Wo Contrast  Result Date: 06/18/2020 CLINICAL DATA:  59 year old female with acute chest, abdominal and pelvic pain. EXAM: CT ANGIOGRAPHY CHEST, ABDOMEN AND PELVIS TECHNIQUE: Non-contrast CT of the chest was initially obtained. Multidetector CT imaging through the chest, abdomen and pelvis was performed using the standard protocol during bolus administration of intravenous contrast. Multiplanar reconstructed images and MIPs were obtained and reviewed to evaluate the vascular anatomy. CONTRAST:  130mL OMNIPAQUE IOHEXOL 350 MG/ML SOLN COMPARISON:  06/09/2014 chest CT and 01/05/2020 abdomen/pelvic CT. FINDINGS: CTA CHEST FINDINGS Cardiovascular: This is a technically satisfactory study. There is no evidence of thoracic aortic aneurysm or dissection. No pulmonary emboli are identified. RIGHT coronary artery, LAD and circumflex atherosclerotic calcifications are present.  No pericardial effusion noted. Heart size is UPPER limits of normal. Mediastinum/Nodes: No enlarged mediastinal, hilar, or axillary lymph nodes. Thyroid gland, trachea, and esophagus demonstrate no significant findings. Lungs/Pleura: Dependent streaky versus interstitial opacities within both lungs again noted. There is no evidence of airspace disease, consolidation, mass, suspicious nodule, pleural effusion or  pneumothorax. A 4 mm nodule along the RIGHT major fissure (series 7: Image 90) is unchanged from 2015 and compatible with a benign process. Musculoskeletal: No acute or suspicious bony abnormalities are identified. Remote rib fractures are present. Review of the MIP images confirms the above findings. CTA ABDOMEN AND PELVIS FINDINGS VASCULAR Aorta: Normal caliber aorta without aneurysm, dissection, vasculitis or significant stenosis. Mild atherosclerotic calcifications are noted. Celiac: Patent without evidence of aneurysm, dissection, vasculitis or significant stenosis. SMA: Patent without evidence of aneurysm, dissection, vasculitis or significant stenosis. Renals: Both renal arteries are patent without evidence of aneurysm, dissection, vasculitis, fibromuscular dysplasia or significant stenosis. IMA: Patent without evidence of aneurysm, dissection, vasculitis or significant stenosis. Inflow: Patent without evidence of aneurysm, dissection, vasculitis or significant stenosis. Veins: No obvious venous abnormality within the limitations of this arterial phase study. Review of the MIP images confirms the above findings. NON-VASCULAR Artifact from lumbar spine and hips surgical hardware obscures some detail within the abdomen and pelvis. Hepatobiliary: Hepatic steatosis again identified without focal hepatic abnormality. Patient is status post cholecystectomy. No biliary dilatation. Pancreas: Unremarkable Spleen: Unremarkable Adrenals/Urinary Tract: The kidneys, adrenal glands and visualized portions of the bladder are unchanged with mild areas of LEFT renal scarring. Stomach/Bowel: Stomach is within normal limits. Appendix appears normal. No evidence of bowel wall thickening, distention, or inflammatory changes. Lymphatic: No enlarged lymph nodes are identified. Reproductive: No gross abnormality but streak artifact from hip replacements in the pelvis noted. Other: No ascites, focal collection or pneumoperitoneum.  Periumbilical hernias containing fat are again noted. Musculoskeletal: No acute or suspicious bony abnormalities are identified. Bilateral hip arthroplasties and L3-L5 posterior fusion changes again noted. Review of the MIP images confirms the above findings. IMPRESSION: 1. No evidence of acute abnormality. No evidence of thoracic or abdominal aortic aneurysm or dissection. No evidence of pulmonary emboli. 2. Hepatic steatosis. 3. Unchanged periumbilical hernias containing fat. 4. Coronary artery disease and aortic Atherosclerosis (ICD10-I70.0). Electronically Signed   By: Margarette Canada M.D.   On: 06/18/2020 19:35    Procedures Procedures (including critical care time)  Medications Ordered in ED Medications  lidocaine (LIDODERM) 5 % 1 patch (1 patch Transdermal Refused 06/18/20 1805)  cyclobenzaprine (FLEXERIL) tablet 5 mg (5 mg Oral Given 06/18/20 1740)  ketorolac (TORADOL) 15 MG/ML injection 15 mg (15 mg Intramuscular Given 06/18/20 1825)  iohexol (OMNIPAQUE) 350 MG/ML injection 100 mL (100 mLs Intravenous Contrast Given 06/18/20 1851)  lactated ringers bolus 500 mL (0 mLs Intravenous Stopped 06/18/20 2058)    ED Course  I have reviewed the triage vital signs and the nursing notes.  Pertinent labs & imaging results that were available during my care of the patient were reviewed by me and considered in my medical decision making (see chart for details).    MDM Rules/Calculators/A&P                           Medical Decision Making: EMIE SOMMERFELD is a 59 y.o. female who presented to the ED today with ground level fall. Pt reports she was walking at home and had a sudden back spasm that caused her to lose  her balance and fall backwards. Pt reports frequent intermittent lower back spasms which cause her great pain, she reports a fall risk history, most recent fall 2 m ago. Pt reports hx of back and hip surgery with chronic back pain issues. Pt denies bowel or bladder dysfunction, numbness, or  weakness. Pt denies LOC or head trauma and reports she was able to get up after fall.  Pt reports back pain now after fall is similar to normal pain but worse.  Past medical history significant for Paoxysmal a fib, COPD/asthma, tobacco use, daily alcohol use,  hypertension,obesity  Reviewed and confirmed nursing documentation for past medical history, family history, social history.  On my initial exam, the pt was in NAD, resting comfortably in stretcher, no midline spinal tenderness, pt has bilateral tenderness in muscles over iliac crest, soft well circumscribed ~5 cm mass to R of umbilicus   Xray without pelvic injury. Labs reassuring, CBC and BMP unremarkable.  EKG with sinus rhythm, stable from prior, no STEMI.   Notified by nursing that pt hypotensive, 74/52, in setting of pt back pain with abdominal mass and hypotension, will obtain CTA C/A/P to rule out aortic abnormality.   Pt given IV fluids, on re-eval BP improved to 116/77. Pt pain improved. Pt able to ambulate without difficulty.    CTA with no evidence of acute abnormality. No aortic aneurysm. Stable periumbilical hernias containing fat- likely reflects mass palpated on exam.  CT also shows No acute or suspicious bony abnormalities.  On reassessment, pt in NAD, laughing and resting comfortably on stretcher, normal saturation on RA.   Consults: none performed  All radiology and laboratory studies reviewed independently and with my attending physician, agree with reading provided by radiologist unless otherwise noted.   Based on the above findings, I believe patient is hemodynamically stable for discharge  Patient/and family educated about specific return precautions for given chief complaint and symptoms.  Patient/and family educated about follow-up with PCP.  Patient/and family expressed understanding of return precautions and need for follow-up.  Patient discharged.  The above care was discussed with and agreed upon by my  attending physician.   Emergency Department Medication Summary:  Medications  lidocaine (LIDODERM) 5 % 1 patch (1 patch Transdermal Refused 06/18/20 1805)  cyclobenzaprine (FLEXERIL) tablet 5 mg (5 mg Oral Given 06/18/20 1740)  ketorolac (TORADOL) 15 MG/ML injection 15 mg (15 mg Intramuscular Given 06/18/20 1825)  iohexol (OMNIPAQUE) 350 MG/ML injection 100 mL (100 mLs Intravenous Contrast Given 06/18/20 1851)  lactated ringers bolus 500 mL (0 mLs Intravenous Stopped 06/18/20 2058)       Final Clinical Impression(s) / ED Diagnoses Final diagnoses:  Chronic bilateral low back pain without sciatica    Rx / DC Orders ED Discharge Orders    None       Roosevelt Locks, MD 06/18/20 2239    Tegeler, Gwenyth Allegra, MD 06/20/20 1540

## 2020-07-14 ENCOUNTER — Emergency Department (HOSPITAL_COMMUNITY)
Admission: EM | Admit: 2020-07-14 | Discharge: 2020-07-15 | Disposition: A | Discharge status: Home / Self Care | Payer: Medicaid Other | Attending: Emergency Medicine | Admitting: Emergency Medicine

## 2020-07-14 ENCOUNTER — Encounter (HOSPITAL_COMMUNITY): Payer: Self-pay | Admitting: Student

## 2020-07-14 ENCOUNTER — Other Ambulatory Visit: Payer: Self-pay

## 2020-07-14 DIAGNOSIS — W19XXXA Unspecified fall, initial encounter: Secondary | ICD-10-CM

## 2020-07-14 DIAGNOSIS — M542 Cervicalgia: Secondary | ICD-10-CM | POA: Diagnosis not present

## 2020-07-14 DIAGNOSIS — S0181XA Laceration without foreign body of other part of head, initial encounter: Secondary | ICD-10-CM | POA: Insufficient documentation

## 2020-07-14 DIAGNOSIS — Z7982 Long term (current) use of aspirin: Secondary | ICD-10-CM | POA: Diagnosis not present

## 2020-07-14 DIAGNOSIS — Z79899 Other long term (current) drug therapy: Secondary | ICD-10-CM | POA: Diagnosis not present

## 2020-07-14 DIAGNOSIS — M545 Low back pain, unspecified: Secondary | ICD-10-CM | POA: Insufficient documentation

## 2020-07-14 DIAGNOSIS — J45909 Unspecified asthma, uncomplicated: Secondary | ICD-10-CM | POA: Insufficient documentation

## 2020-07-14 DIAGNOSIS — W010XXA Fall on same level from slipping, tripping and stumbling without subsequent striking against object, initial encounter: Secondary | ICD-10-CM | POA: Insufficient documentation

## 2020-07-14 DIAGNOSIS — I10 Essential (primary) hypertension: Secondary | ICD-10-CM | POA: Insufficient documentation

## 2020-07-14 DIAGNOSIS — F17210 Nicotine dependence, cigarettes, uncomplicated: Secondary | ICD-10-CM | POA: Diagnosis not present

## 2020-07-14 DIAGNOSIS — S0993XA Unspecified injury of face, initial encounter: Secondary | ICD-10-CM | POA: Diagnosis present

## 2020-07-14 MED ORDER — LIDOCAINE-EPINEPHRINE-TETRACAINE (LET) TOPICAL GEL
3.0000 mL | Freq: Once | TOPICAL | Status: AC
  Administered 2020-07-15: 3 mL via TOPICAL
  Filled 2020-07-14: qty 3

## 2020-07-14 MED ORDER — LIDOCAINE-EPINEPHRINE (PF) 2 %-1:200000 IJ SOLN
10.0000 mL | Freq: Once | INTRAMUSCULAR | Status: AC
  Administered 2020-07-15: 10 mL
  Filled 2020-07-14: qty 20
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
## 2020-07-15 ENCOUNTER — Encounter (HOSPITAL_COMMUNITY): Payer: Self-pay

## 2020-07-15 ENCOUNTER — Emergency Department (HOSPITAL_COMMUNITY): Payer: Medicaid Other

## 2020-07-15 NOTE — ED Notes (Signed)
Patient given PO challenge per verbal order from MD Patient passed challenge, no nausea/vomiting noted
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
## 2020-07-15 NOTE — Discharge Instructions (Signed)
You were seen in the emergency department tonight after a fall.  The CT scan of your head did not show any bleeding and your x-rays and CT did not show any fractures.  You do have some degenerative changes noted in your neck.  X-rays of your back showed findings from your prior surgery, there was question that there could be a screw that is mildly loosening, please follow-up with your prior surgeon to discuss this.   Your lacerations were closed with absorbable stitches.  Please keep these areas clean and dry for the next 24 hours, after 24 hours you may get them wet but do not soak them.  The stitches will gradually come out on their own.  Please keep these covered when in the sunlight to help prevent scarring.  With your primary care provider within 3 days for general recheck.  Return to the ER for new or worsening symptoms including but not limited to redness/drainage from the wounds, fever, numbness, weakness, chest pain, abdominal pain, blood in urine/stool, worsening pain, or any other concerns.

## 2020-12-05 ENCOUNTER — Emergency Department (HOSPITAL_COMMUNITY): Payer: Medicaid Other

## 2020-12-05 ENCOUNTER — Other Ambulatory Visit: Payer: Self-pay

## 2020-12-05 ENCOUNTER — Emergency Department (HOSPITAL_COMMUNITY)
Admission: EM | Admit: 2020-12-05 | Discharge: 2020-12-06 | Disposition: A | Discharge status: Home / Self Care | Payer: Medicaid Other | Attending: Emergency Medicine | Admitting: Emergency Medicine

## 2020-12-05 ENCOUNTER — Encounter (HOSPITAL_COMMUNITY): Payer: Self-pay

## 2020-12-05 DIAGNOSIS — Z7982 Long term (current) use of aspirin: Secondary | ICD-10-CM | POA: Insufficient documentation

## 2020-12-05 DIAGNOSIS — J45909 Unspecified asthma, uncomplicated: Secondary | ICD-10-CM | POA: Diagnosis not present

## 2020-12-05 DIAGNOSIS — Z79899 Other long term (current) drug therapy: Secondary | ICD-10-CM | POA: Diagnosis not present

## 2020-12-05 DIAGNOSIS — F17210 Nicotine dependence, cigarettes, uncomplicated: Secondary | ICD-10-CM | POA: Insufficient documentation

## 2020-12-05 DIAGNOSIS — R109 Unspecified abdominal pain: Secondary | ICD-10-CM | POA: Diagnosis not present

## 2020-12-05 DIAGNOSIS — K429 Umbilical hernia without obstruction or gangrene: Secondary | ICD-10-CM | POA: Insufficient documentation

## 2020-12-05 DIAGNOSIS — R609 Edema, unspecified: Secondary | ICD-10-CM | POA: Insufficient documentation

## 2020-12-05 DIAGNOSIS — Z20822 Contact with and (suspected) exposure to covid-19: Secondary | ICD-10-CM | POA: Diagnosis not present

## 2020-12-05 DIAGNOSIS — R0602 Shortness of breath: Secondary | ICD-10-CM | POA: Diagnosis present

## 2020-12-05 DIAGNOSIS — I10 Essential (primary) hypertension: Secondary | ICD-10-CM | POA: Diagnosis not present

## 2020-12-05 DIAGNOSIS — R7401 Elevation of levels of liver transaminase levels: Secondary | ICD-10-CM | POA: Diagnosis not present

## 2020-12-05 DIAGNOSIS — Z96642 Presence of left artificial hip joint: Secondary | ICD-10-CM | POA: Diagnosis not present

## 2020-12-05 DIAGNOSIS — J209 Acute bronchitis, unspecified: Secondary | ICD-10-CM | POA: Insufficient documentation

## 2020-12-05 LAB — URINALYSIS, ROUTINE W REFLEX MICROSCOPIC
Bilirubin Urine: NEGATIVE
Glucose, UA: NEGATIVE mg/dL
Ketones, ur: 20 mg/dL — AB
Leukocytes,Ua: NEGATIVE
Nitrite: NEGATIVE
Protein, ur: NEGATIVE mg/dL
Specific Gravity, Urine: 1.017 (ref 1.005–1.030)
pH: 5 (ref 5.0–8.0)

## 2020-12-05 LAB — CBC WITH DIFFERENTIAL/PLATELET
Abs Immature Granulocytes: 0.02 10*3/uL (ref 0.00–0.07)
Basophils Absolute: 0.1 10*3/uL (ref 0.0–0.1)
Basophils Relative: 1 %
Eosinophils Absolute: 0.1 10*3/uL (ref 0.0–0.5)
Eosinophils Relative: 1 %
HCT: 41.3 % (ref 36.0–46.0)
Hemoglobin: 13.2 g/dL (ref 12.0–15.0)
Immature Granulocytes: 0 %
Lymphocytes Relative: 48 %
Lymphs Abs: 3.1 10*3/uL (ref 0.7–4.0)
MCH: 24.7 pg — ABNORMAL LOW (ref 26.0–34.0)
MCHC: 32 g/dL (ref 30.0–36.0)
MCV: 77.2 fL — ABNORMAL LOW (ref 80.0–100.0)
Monocytes Absolute: 0.6 10*3/uL (ref 0.1–1.0)
Monocytes Relative: 10 %
Neutro Abs: 2.6 10*3/uL (ref 1.7–7.7)
Neutrophils Relative %: 40 %
Platelets: 107 10*3/uL — ABNORMAL LOW (ref 150–400)
RBC: 5.35 MIL/uL — ABNORMAL HIGH (ref 3.87–5.11)
RDW: 17.5 % — ABNORMAL HIGH (ref 11.5–15.5)
WBC: 6.5 10*3/uL (ref 4.0–10.5)
nRBC: 0 % (ref 0.0–0.2)

## 2020-12-05 LAB — BRAIN NATRIURETIC PEPTIDE: B Natriuretic Peptide: 69.8 pg/mL (ref 0.0–100.0)

## 2020-12-05 LAB — TROPONIN I (HIGH SENSITIVITY)
Troponin I (High Sensitivity): 4 ng/L (ref <18)
Troponin I (High Sensitivity): 4 ng/L (ref <18)

## 2020-12-05 LAB — COMPREHENSIVE METABOLIC PANEL
ALT: 119 U/L — ABNORMAL HIGH (ref 0–44)
AST: 400 U/L — ABNORMAL HIGH (ref 15–41)
Albumin: 3.9 g/dL (ref 3.5–5.0)
Alkaline Phosphatase: 168 U/L — ABNORMAL HIGH (ref 38–126)
Anion gap: 13 (ref 5–15)
BUN: <5 mg/dL — ABNORMAL LOW (ref 6–20)
CO2: 23 mmol/L (ref 22–32)
Calcium: 9 mg/dL (ref 8.9–10.3)
Chloride: 99 mmol/L (ref 98–111)
Creatinine, Ser: 0.59 mg/dL (ref 0.44–1.00)
GFR, Estimated: >60 mL/min (ref >60)
Glucose, Bld: 88 mg/dL (ref 70–99)
Potassium: 5.5 mmol/L — ABNORMAL HIGH (ref 3.5–5.1)
Sodium: 135 mmol/L (ref 135–145)
Total Bilirubin: 0.9 mg/dL (ref 0.3–1.2)
Total Protein: 8.6 g/dL — ABNORMAL HIGH (ref 6.5–8.1)

## 2020-12-05 LAB — LACTIC ACID, PLASMA: Lactic Acid, Venous: 2.8 mmol/L (ref 0.5–1.9)

## 2020-12-05 LAB — RESP PANEL BY RT-PCR (FLU A&B, COVID) ARPGX2
Influenza A by PCR: NEGATIVE
Influenza B by PCR: NEGATIVE
SARS Coronavirus 2 by RT PCR: NEGATIVE

## 2020-12-05 LAB — PROTIME-INR
INR: 1.1 (ref 0.8–1.2)
Prothrombin Time: 13.5 seconds (ref 11.4–15.2)

## 2020-12-05 LAB — LIPASE, BLOOD: Lipase: 33 U/L (ref 11–51)

## 2020-12-05 LAB — APTT: aPTT: 47 seconds — ABNORMAL HIGH (ref 24–36)

## 2020-12-05 LAB — D-DIMER, QUANTITATIVE: D-Dimer, Quant: 1.18 ug/mL-FEU — ABNORMAL HIGH (ref 0.00–0.50)

## 2020-12-05 MED ORDER — DOXYCYCLINE HYCLATE 100 MG PO CAPS: 100.0000 mg | ORAL_CAPSULE | Freq: Two times a day (BID) | ORAL | 0 refills | Status: DC

## 2020-12-05 MED ORDER — SODIUM CHLORIDE 0.9 % IV BOLUS
500.0000 mL | Freq: Once | INTRAVENOUS | Status: AC
  Administered 2020-12-05: 500 mL via INTRAVENOUS

## 2020-12-05 MED ORDER — IOHEXOL 350 MG/ML SOLN
100.0000 mL | Freq: Once | INTRAVENOUS | Status: AC | PRN
  Administered 2020-12-05: 100 mL via INTRAVENOUS

## 2020-12-05 MED ORDER — BENZONATATE 100 MG PO CAPS: 100.0000 mg | ORAL_CAPSULE | Freq: Three times a day (TID) | ORAL | 0 refills | Status: DC | PRN

## 2020-12-05 MED ORDER — DOXYCYCLINE HYCLATE 100 MG PO TABS
100.0000 mg | ORAL_TABLET | Freq: Once | ORAL | Status: AC
  Administered 2020-12-06: 100 mg via ORAL
  Filled 2020-12-05: qty 1

## 2020-12-05 NOTE — ED Triage Notes (Addendum)
Patient c/o lower extremity swelling and SOB x 4-5 days ago. Patient also c/o a productive cough with thick sputum x 4-5 days ago. Patient states she did not look at the color.

## 2020-12-05 NOTE — Discharge Instructions (Signed)
Your liver enzymes were high today.  Please follow up with your family doctor for recheck in the next week.  Avoid alcohol and acetaminophen or tylenol.
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
## 2020-12-05 NOTE — ED Notes (Signed)
Pt sleeping and O2 dropped to 87%. Pt woken up and O2 went up to 94% within 10 seconds. Pt placed on 2L [place] due to her going back to sleep and to prevent her O2 from dropping

## 2020-12-05 NOTE — ED Provider Notes (Signed)
[facility] DEPT Provider Note   CSN: [id number] Arrival date & time: 12/05/20  1844     History Chief Complaint  Patient presents with  . Shortness of Breath  . Leg Swelling  . Cough    Marissa Weber is a 60 y.o. female.  The history is provided by the patient and medical records.   Marissa Weber is a 60 y.o. female who presents to the Emergency Department complaining of cough. She presents the emergency department complaining of 4 to 5 days of cough productive of sputum. She has associated shortness of breath and bilateral lower extremity edema. She reports pain to the left side of her back that is waxing and waning. No fever, chest pain, vomiting, diarrhea. She did have COVID-19 infection at the end of February. When she had it she did feel short of breath but felt that her symptoms had improved only to worsen a few days ago. She does not take any medications. She smokes tobacco. She drinks occasional alcohol.     Past Medical History:  Diagnosis Date  . Arthritis    hip  and knees   . Asthma    hx of years ago   . Atrial fibrillation ([place]) 11/2016  . Blood transfusion    hx of years ago   . Cholecystitis   . Dysrhythmia    a fib  . Hypertension     Patient Active Problem List   Diagnosis Date Noted  . Acute gastroenteritis 01/05/2020  . Nausea vomiting and diarrhea 01/05/2020  . Hypokalemia due to excessive gastrointestinal loss of potassium 01/05/2020  . Hypomagnesemia 01/05/2020  . SIRS (systemic inflammatory response syndrome) ([place]) 01/05/2020  . Orthostatic syncope 01/05/2020  . Abnormal LFTs 01/05/2020  . Lactic acidosis 01/05/2020  . Intractable nausea and vomiting 01/05/2020  . AF (paroxysmal atrial fibrillation) ([place]) 12/16/2016  . Hyponatremia 12/16/2016  . Anemia in other chronic diseases classified elsewhere 12/16/2016  . Leukocytosis 12/16/2016  . Closed left hip fracture ([place]) 12/16/2016  . Closed displaced fracture  of greater trochanter of left femur ([place])   . OA (osteoarthritis) of hip 12/05/2016  . Atrial fibrillation with RVR ([place]) 11/27/2016  . Anxiety 11/27/2016  . Left hip pain 11/27/2016  . Angioedema 04/16/2016  . Cholecystitis, acute with cholelithiasis 12/11/2013  . Postop Acute blood loss anemia 10/25/2011  . Osteoarthritis of hip 10/22/2011    Past Surgical History:  Procedure Laterality Date  . ANKLE SURGERY    . BACK SURGERY     SPINAL FUSION  . CESAREAN SECTION    . cesearan     x4  . CHOLECYSTECTOMY N/A 12/11/2013   Procedure: LAPAROSCOPIC CHOLECYSTECTOMY WITH INTRAOPERATIVE CHOLANGIOGRAM;  Surgeon: Imogene Burn. Georgette Dover, MD;  Location: [facility];  Service: General;  Laterality: N/A;  . LEFT HEART CATH AND CORONARY ANGIOGRAPHY N/A 11/29/2016   Procedure: Left Heart Cath and Coronary Angiography;  Surgeon: [name], MD;  Location: [facility];  Service: Cardiovascular;  Laterality: N/A;  . TOTAL HIP ARTHROPLASTY  10/22/2011   Procedure: TOTAL HIP ARTHROPLASTY;  Surgeon: Gearlean Alf, MD;  Location: [facility];  Service: Orthopedics;  Laterality: Right;  . TOTAL HIP ARTHROPLASTY Left 12/05/2016   Procedure: LEFT TOTAL HIP ARTHROPLASTY ANTERIOR APPROACH;  Surgeon: Gaynelle Arabian, MD;  Location: [facility];  Service: Orthopedics;  Laterality: Left;     OB History    Gravida  4   Para  4   Term  4  Preterm      AB      Living  5     SAB      IAB      Ectopic      Multiple      Live Births              No family history on file.  Social History   Tobacco Use  . Smoking status: Current Every Day Smoker    Packs/day: 0.50    Years: 10.00    Pack years: 5.00    Types: Cigarettes  . Smokeless tobacco: Never Used  Vaping Use  . Vaping Use: Never used  Substance Use Topics  . Alcohol use: Yes    Alcohol/week: 5.0 standard drinks    Types: 1 Glasses of wine, 4 Cans of beer per week    Comment: ocassionally  . Drug use: No    Home Medications Prior to  Admission medications   Medication Sig Start Date End Date Taking? Authorizing Provider  albuterol (PROVENTIL HFA;VENTOLIN HFA) 108 (90 Base) MCG/ACT inhaler Inhale 2 puffs into the lungs every 4 (four) hours as needed for wheezing or shortness of breath.   Yes [provider]  aspirin EC 81 MG tablet Take 162 mg by mouth daily.   Yes [provider]  benzonatate (TESSALON) 100 MG capsule Take 1 capsule (100 mg total) by mouth 3 (three) times daily as needed for cough. 12/05/20  Yes Quintella Reichert, MD  doxycycline (VIBRAMYCIN) 100 MG capsule Take 1 capsule (100 mg total) by mouth 2 (two) times daily. 12/05/20  Yes Quintella Reichert, MD  tiZANidine (ZANAFLEX) 2 MG tablet Take 2 mg by mouth at bedtime as needed for muscle spasms.   Yes [provider]  folic acid (FOLVITE) 1 MG tablet Take 1 tablet (1 mg total) by mouth daily. Patient not taking: Reported on 12/05/2020 01/10/20   Damita Lack, MD  methocarbamol (ROBAXIN) 500 MG tablet Take 1 tablet (500 mg total) by mouth every 6 (six) hours as needed for muscle spasms. Patient not taking: No sig reported 12/06/16   Dara Lords, Alexzandrew L, PA-C  metoprolol tartrate (LOPRESSOR) 25 MG tablet Take 1 tablet (25 mg total) by mouth 2 (two) times daily. Patient not taking: Reported on 12/05/2020 11/30/16   [name], MD  Multiple Vitamin (MULTIVITAMIN WITH MINERALS) TABS tablet Take 1 tablet by mouth daily. Patient not taking: Reported on 12/05/2020 01/10/20   Damita Lack, MD  nicotine (NICODERM CQ - DOSED IN MG/24 HOURS) 14 mg/24hr patch Place 1 patch (14 mg total) onto the skin daily. Patient not taking: Reported on 12/05/2020 01/10/20   Damita Lack, MD    Allergies    Bee venom and Lisinopril  Review of Systems   Review of Systems  All other systems reviewed and are negative.   Physical Exam Updated Vital Signs BP 110/73   Pulse 91   Temp 98.2 F (36.8 C) (Oral)   Resp 20   Ht 4' 10.5" (1.486 m)   Wt  77.1 kg   SpO2 90%   BMI 34.93 kg/m   Physical Exam Vitals and nursing note reviewed.  Constitutional:      Appearance: She is well-developed.  HENT:     Head: Normocephalic and atraumatic.  Cardiovascular:     Rate and Rhythm: Normal rate and regular rhythm.     Heart sounds: No murmur heard.   Pulmonary:     Effort: Pulmonary effort is  normal. No respiratory distress.     Breath sounds: Normal breath sounds.  Abdominal:     Palpations: Abdomen is soft.     Tenderness: There is no abdominal tenderness. There is no guarding or rebound.     Comments: There is an umbilical hernia that is nontender to palpation  Musculoskeletal:        General: No tenderness.     Comments: One plus pitting edema to the left lower extremity, trace edema to the right lower extremity  Skin:    General: Skin is warm and dry.  Neurological:     Mental Status: She is alert and oriented to person, place, and time.  Psychiatric:        Behavior: Behavior normal.     ED Results / Procedures / Treatments   Labs (all labs ordered are listed, but only abnormal results are displayed) Labs Reviewed  LACTIC ACID, PLASMA - Abnormal; Notable for the following components:      Result Value   Lactic Acid, Venous 2.8 (*)    All other components within normal limits  COMPREHENSIVE METABOLIC PANEL - Abnormal; Notable for the following components:   Potassium 5.5 (*)    BUN <5 (*)    Total Protein 8.6 (*)    AST 400 (*)    ALT 119 (*)    Alkaline Phosphatase 168 (*)    All other components within normal limits  CBC WITH DIFFERENTIAL/PLATELET - Abnormal; Notable for the following components:   RBC 5.35 (*)    MCV 77.2 (*)    MCH 24.7 (*)    RDW 17.5 (*)    Platelets 107 (*)    All other components within normal limits  APTT - Abnormal; Notable for the following components:   aPTT 47 (*)    All other components within normal limits  URINALYSIS, ROUTINE W REFLEX MICROSCOPIC - Abnormal; Notable for the  following components:   Hgb urine dipstick SMALL (*)    Ketones, ur 20 (*)    Bacteria, UA RARE (*)    All other components within normal limits  D-DIMER, QUANTITATIVE - Abnormal; Notable for the following components:   D-Dimer, Quant 1.18 (*)    All other components within normal limits  RESP PANEL BY RT-PCR (FLU A&B, COVID) ARPGX2  CULTURE, BLOOD (SINGLE)  URINE CULTURE  PROTIME-INR  BRAIN NATRIURETIC PEPTIDE  LIPASE, BLOOD  LACTIC ACID, PLASMA  LACTIC ACID, PLASMA  POTASSIUM  TROPONIN I (HIGH SENSITIVITY)  TROPONIN I (HIGH SENSITIVITY)    EKG EKG Interpretation  Date/Time:  Monday December 05 2020 18:53:33 EDT Ventricular Rate:  97 PR Interval:  166 QRS Duration: 109 QT Interval:  379 QTC Calculation: 482 R Axis:   115 Text Interpretation: Sinus rhythm Consider left atrial enlargement Right axis deviation Low voltage, precordial leads 12 Lead; [place] Confirmed by Quintella Reichert [phone]) on 12/05/2020 7:28:59 PM   Radiology DG Chest 2 View  Result Date: 12/05/2020 CLINICAL DATA:  Shortness of breath. Lower extremity swelling. Cough. EXAM: CHEST - 2 VIEW COMPARISON:  Radiograph 01/04/2020.  CT 06/18/2020 FINDINGS: Low lung volumes. Normal heart size and mediastinal contours for technique. Mild bibasilar atelectasis. No confluent consolidation. No pleural effusion. No pulmonary edema. No pneumothorax. Mild degenerative change in the spine. IMPRESSION: Low lung volumes with bibasilar atelectasis. Electronically Signed   By: Keith Rake M.D.   On: 12/05/2020 19:40   CT Angio Chest PE W/Cm &/Or Wo Cm  Result Date: 12/05/2020 CLINICAL DATA:  Abdominal  pain, positive D-dimer, short of breath, lower extremity edema EXAM: CT ANGIOGRAPHY CHEST CT ABDOMEN AND PELVIS WITH CONTRAST TECHNIQUE: Multidetector CT imaging of the chest was performed using the standard protocol during bolus administration of intravenous contrast. Multiplanar CT image reconstructions and MIPs were  obtained to evaluate the vascular anatomy. Multidetector CT imaging of the abdomen and pelvis was performed using the standard protocol during bolus administration of intravenous contrast. CONTRAST:  125mL OMNIPAQUE IOHEXOL 350 MG/ML SOLN COMPARISON:  12/05/2020 FINDINGS: CTA CHEST FINDINGS Cardiovascular: This is a technically adequate evaluation of the pulmonary vasculature. No filling defects or pulmonary emboli. The heart is unremarkable without pericardial effusion. Moderate atherosclerosis of the coronary vasculature. No evidence of thoracic aortic aneurysm or dissection. Mediastinum/Nodes: No enlarged mediastinal, hilar, or axillary lymph nodes. Thyroid gland, trachea, and esophagus demonstrate no significant findings. Lungs/Pleura: Hypoventilatory changes dependent lower lobes. No airspace disease, effusion, or pneumothorax. Stable subpleural nodule left major fissure unchanged since 2015 and benign. Central airways are patent. Musculoskeletal: No acute or destructive bony lesions. Reconstructed images demonstrate no additional findings. Review of the MIP images confirms the above findings. CT ABDOMEN and PELVIS FINDINGS Hepatobiliary: Diffuse hepatic steatosis. No focal liver abnormalities. The gallbladder is surgically absent. Pancreas: Unremarkable. No pancreatic ductal dilatation or surrounding inflammatory changes. Spleen: Normal in size without focal abnormality. Adrenals/Urinary Tract: Scattered areas of bilateral renal cortical scarring. Otherwise the kidneys enhance normally. No urinary tract calculi or obstruction. The adrenals and bladder are grossly normal. Stomach/Bowel: No bowel obstruction or ileus. Scattered diverticulosis of the colon without diverticulitis. No bowel wall thickening or inflammatory change. Vascular/Lymphatic: Aortic atherosclerosis. No enlarged abdominal or pelvic lymph nodes. Reproductive: Uterus and bilateral adnexa are unremarkable. Other: No free fluid or free gas.  Stable fat containing right paraumbilical hernias. No bowel herniation. Musculoskeletal: Bilateral hip arthroplasties are unremarkable. Stable lower lumbar fusion. No acute or destructive bony lesions. Reconstructed images demonstrate no additional findings. Review of the MIP images confirms the above findings. IMPRESSION: 1. No evidence of pulmonary embolus. 2. No acute intrathoracic process. 3. No acute intra-abdominal or intrapelvic process. 4. Stable fat containing right paraumbilical hernia. 5. Hepatic steatosis. 6. Distal colonic diverticulosis without diverticulitis. 7.  Aortic Atherosclerosis (ICD10-I70.0). Electronically Signed   By: Randa Ngo M.D.   On: 12/05/2020 23:12   CT Abdomen Pelvis W Contrast  Result Date: 12/05/2020 CLINICAL DATA:  Abdominal pain, positive D-dimer, short of breath, lower extremity edema EXAM: CT ANGIOGRAPHY CHEST CT ABDOMEN AND PELVIS WITH CONTRAST TECHNIQUE: Multidetector CT imaging of the chest was performed using the standard protocol during bolus administration of intravenous contrast. Multiplanar CT image reconstructions and MIPs were obtained to evaluate the vascular anatomy. Multidetector CT imaging of the abdomen and pelvis was performed using the standard protocol during bolus administration of intravenous contrast. CONTRAST:  18mL OMNIPAQUE IOHEXOL 350 MG/ML SOLN COMPARISON:  12/05/2020 FINDINGS: CTA CHEST FINDINGS Cardiovascular: This is a technically adequate evaluation of the pulmonary vasculature. No filling defects or pulmonary emboli. The heart is unremarkable without pericardial effusion. Moderate atherosclerosis of the coronary vasculature. No evidence of thoracic aortic aneurysm or dissection. Mediastinum/Nodes: No enlarged mediastinal, hilar, or axillary lymph nodes. Thyroid gland, trachea, and esophagus demonstrate no significant findings. Lungs/Pleura: Hypoventilatory changes dependent lower lobes. No airspace disease, effusion, or pneumothorax.  Stable subpleural nodule left major fissure unchanged since 2015 and benign. Central airways are patent. Musculoskeletal: No acute or destructive bony lesions. Reconstructed images demonstrate no additional findings. Review of the MIP images confirms the above findings.  CT ABDOMEN and PELVIS FINDINGS Hepatobiliary: Diffuse hepatic steatosis. No focal liver abnormalities. The gallbladder is surgically absent. Pancreas: Unremarkable. No pancreatic ductal dilatation or surrounding inflammatory changes. Spleen: Normal in size without focal abnormality. Adrenals/Urinary Tract: Scattered areas of bilateral renal cortical scarring. Otherwise the kidneys enhance normally. No urinary tract calculi or obstruction. The adrenals and bladder are grossly normal. Stomach/Bowel: No bowel obstruction or ileus. Scattered diverticulosis of the colon without diverticulitis. No bowel wall thickening or inflammatory change. Vascular/Lymphatic: Aortic atherosclerosis. No enlarged abdominal or pelvic lymph nodes. Reproductive: Uterus and bilateral adnexa are unremarkable. Other: No free fluid or free gas. Stable fat containing right paraumbilical hernias. No bowel herniation. Musculoskeletal: Bilateral hip arthroplasties are unremarkable. Stable lower lumbar fusion. No acute or destructive bony lesions. Reconstructed images demonstrate no additional findings. Review of the MIP images confirms the above findings. IMPRESSION: 1. No evidence of pulmonary embolus. 2. No acute intrathoracic process. 3. No acute intra-abdominal or intrapelvic process. 4. Stable fat containing right paraumbilical hernia. 5. Hepatic steatosis. 6. Distal colonic diverticulosis without diverticulitis. 7.  Aortic Atherosclerosis (ICD10-I70.0). Electronically Signed   By: Randa Ngo M.D.   On: 12/05/2020 23:12    Procedures Procedures   Medications Ordered in ED Medications  doxycycline (VIBRA-TABS) tablet 100 mg (has no administration in time range)   sodium chloride 0.9 % bolus 500 mL (0 mLs Intravenous Stopped 12/06/20 0039)  iohexol (OMNIPAQUE) 350 MG/ML injection 100 mL (100 mLs Intravenous Contrast Given 12/05/20 2235)    ED Course  I have reviewed the triage vital signs and the nursing notes.  Pertinent labs & imaging results that were available during my care of the patient were reviewed by me and considered in my medical decision making (see chart for details).    MDM Rules/Calculators/A&P                         patient with history of a fib, alcohol use here for evaluation of 4 to 5 days of cough, central abdominal discomfort. She is non-toxic appearing on evaluation with no respiratory distress. Chest x-ray without acute abnormality. Given patient's asymmetric lower extremity edema anti-dimer was obtained, D dimer is elevated and CTA was obtained. CTA is negative for PE. CT abdomen pelvis demonstrates stable hernia but no additional acute abnormalities. Initial lactic acid and potassium were elevated, feel like this is likely a spurious, will recollect. No current evidence of sepsis. Discussed with patient likely bronchitis, will start antibiotics for possible bacterial component due to duration of symptoms. Also recommend that she return tomorrow for a vascular ultrasound to rule out DVT given her asymmetric edema and elevated D dimer. Patient care transferred pending repeat labs. Patient is in agreement with treatment plan.   Patient does have elevation in transaminases, history of alcohol use and to drink heavily over the weekend. No evidence of biliary obstruction. She is not on hepatotoxic medications at this time. Discussed with patient avoiding acetaminophen, decreasing alcohol intake and close PCP follow-up with return precautions.  Final Clinical Impression(s) / ED Diagnoses Final diagnoses:  Acute bronchitis, unspecified organism  Peripheral edema  Transaminitis    Rx / DC Orders ED Discharge Orders         Ordered     doxycycline (VIBRAMYCIN) 100 MG capsule  2 times daily        12/05/20 2352    benzonatate (TESSALON) 100 MG capsule  3 times daily PRN  12/05/20 2352           Quintella Reichert, MD 12/06/20 [phone]

## 2020-12-05 NOTE — ED Notes (Addendum)
EKG completed and exported in triage at 18:53.

## 2020-12-05 NOTE — ED Notes (Signed)
Pt to CT via stretcher

## 2020-12-05 NOTE — ED Notes (Signed)
Pt ambulatory to bathroom

## 2020-12-05 NOTE — ED Notes (Signed)
Lactic acid critical result called: 2.8

## 2020-12-06 ENCOUNTER — Ambulatory Visit (HOSPITAL_COMMUNITY): Admission: RE | Admit: 2020-12-06 | Payer: Medicaid Other | Source: Ambulatory Visit

## 2020-12-06 LAB — POTASSIUM: Potassium: 3.7 mmol/L (ref 3.5–5.1)

## 2020-12-06 LAB — LACTIC ACID, PLASMA: Lactic Acid, Venous: 2.1 mmol/L (ref 0.5–1.9)

## 2020-12-06 NOTE — ED Notes (Signed)
Pt ambulatory to bathroom on room air. Upon arrival to room, pulse ox read 93%

## 2020-12-11 LAB — CULTURE, BLOOD (SINGLE): Culture: NO GROWTH

## 2020-12-11 LAB — URINE CULTURE: Culture: 2000 — AB

## 2020-12-12 ENCOUNTER — Telehealth: Payer: Self-pay | Admitting: Emergency Medicine

## 2020-12-12 NOTE — Telephone Encounter (Signed)
Post ED Visit - Positive Culture Follow-up  Culture report reviewed by antimicrobial stewardship pharmacist: [facility] Team []  Elenor Quinones, Pharm.D. []  Heide Guile, Pharm.D., BCPS AQ-ID []  Parks Neptune, Pharm.D., BCPS []  Alycia Rossetti, Pharm.D., BCPS []  [place], Pharm.D., BCPS, AAHIVP []  Legrand Como, Pharm.D., BCPS, AAHIVP []  Salome Arnt, PharmD, BCPS []  Johnnette Gourd, PharmD, BCPS []  Hughes Better, PharmD, BCPS []  Leeroy Cha, PharmD []  Laqueta Linden, PharmD, BCPS []  Albertina Parr, PharmD  [facility] Team []  Leodis Sias, PharmD []  Lindell Spar, PharmD []  Royetta Asal, PharmD []  Graylin Shiver, Rph []  Rema Fendt) Glennon Mac, PharmD []  Arlyn Dunning, PharmD []  Netta Cedars, PharmD []  Dia Sitter, PharmD []  Leone Haven, PharmD []  Gretta Arab, PharmD []  Theodis Shove, PharmD []  Peggyann Juba, PharmD []  Reuel Boom, PharmD   Positive urine culture Treated with doxycycline, organism sensitive to the same and no further patient follow-up is required at this time.  Hazle Nordmann 12/12/2020, 11:25 AM

## 2021-02-02 ENCOUNTER — Telehealth: Payer: Self-pay | Admitting: Cardiovascular Disease

## 2021-02-02 NOTE — Telephone Encounter (Signed)
..   Medicaid Managed Care   Unsuccessful Outreach Note  02/02/2021 Name: Marissa Weber MRN: [id number] DOB: 1960-09-19  Referred by: [name], MD Reason for referral : High Risk Managed Medicaid (Attempted to reach Marissa Weber today to get her scheduled with the [facility] RNCM for a phone visit. No one answered her phone and the VM was full so I could not leave a message.)   An unsuccessful telephone outreach was attempted today. The patient was referred to the case management team for assistance with care management and care coordination.   Follow Up Plan: The care management team will reach out to the patient again over the next 7-14 days.   [place]

## 2021-02-06 ENCOUNTER — Telehealth: Payer: Self-pay | Admitting: Cardiovascular Disease

## 2021-02-06 NOTE — Telephone Encounter (Signed)
..   Medicaid Managed Care   Unsuccessful Outreach Note  02/06/2021 Name: Marissa Weber MRN: [id number] DOB: Sep 06, 1960  Referred by: [name], MD Reason for referral : High Risk Managed Medicaid (Attempted to reach [name] this morning to get her scheduled with the [facility]. Her VM was full and I was not able to leave her a message.)   A second unsuccessful telephone outreach was attempted today. The patient was referred to the case management team for assistance with care management and care coordination.   Follow Up Plan: The care management team will reach out to the patient again over the next 7-14 days.   [place]

## 2021-02-07 ENCOUNTER — Telehealth: Payer: Self-pay | Admitting: Cardiovascular Disease

## 2021-02-07 NOTE — Telephone Encounter (Signed)
..   Medicaid Managed Care   Unsuccessful Outreach Note  02/07/2021 Name: Marissa Weber MRN: [id number] DOB: Dec 17, 1960  Referred by: [name], MD Reason for referral : High Risk Managed Medicaid (Third attempt to reach Ms.Lheureux to get her scheduled with the [facility] Team. Unable to leave her a message due to her VM being full.)   Third unsuccessful telephone outreach was attempted today. The patient was referred to the case management team for assistance with care management and care coordination. The patient's primary care provider has been notified of our unsuccessful attempts to make or maintain contact with the patient. The care management team is pleased to engage with this patient at any time in the future should he/she be interested in assistance from the care management team.   Follow Up Plan: We have been unable to make contact with the patient for follow up. The care management team is available to follow up with the patient after provider conversation with the patient regarding recommendation for care management engagement and subsequent re-referral to the care management team.   [place], [place]

## 2021-05-05 ENCOUNTER — Other Ambulatory Visit: Payer: Self-pay

## 2021-05-05 ENCOUNTER — Emergency Department (HOSPITAL_COMMUNITY): Payer: Medicaid Other

## 2021-05-05 ENCOUNTER — Emergency Department (HOSPITAL_COMMUNITY)
Admission: EM | Admit: 2021-05-05 | Discharge: 2021-05-05 | Disposition: A | Discharge status: Home / Self Care | Payer: Medicaid Other | Attending: Emergency Medicine | Admitting: Emergency Medicine

## 2021-05-05 ENCOUNTER — Encounter (HOSPITAL_COMMUNITY): Payer: Self-pay

## 2021-05-05 DIAGNOSIS — K7460 Unspecified cirrhosis of liver: Secondary | ICD-10-CM

## 2021-05-05 DIAGNOSIS — D72829 Elevated white blood cell count, unspecified: Secondary | ICD-10-CM | POA: Insufficient documentation

## 2021-05-05 DIAGNOSIS — Z28310 Unvaccinated for covid-19: Secondary | ICD-10-CM | POA: Insufficient documentation

## 2021-05-05 DIAGNOSIS — J45909 Unspecified asthma, uncomplicated: Secondary | ICD-10-CM | POA: Diagnosis not present

## 2021-05-05 DIAGNOSIS — R109 Unspecified abdominal pain: Secondary | ICD-10-CM | POA: Diagnosis present

## 2021-05-05 DIAGNOSIS — R188 Other ascites: Secondary | ICD-10-CM | POA: Insufficient documentation

## 2021-05-05 DIAGNOSIS — F17210 Nicotine dependence, cigarettes, uncomplicated: Secondary | ICD-10-CM | POA: Diagnosis not present

## 2021-05-05 DIAGNOSIS — K529 Noninfective gastroenteritis and colitis, unspecified: Secondary | ICD-10-CM

## 2021-05-05 DIAGNOSIS — Z7982 Long term (current) use of aspirin: Secondary | ICD-10-CM | POA: Insufficient documentation

## 2021-05-05 DIAGNOSIS — Z20822 Contact with and (suspected) exposure to covid-19: Secondary | ICD-10-CM | POA: Insufficient documentation

## 2021-05-05 DIAGNOSIS — Z8616 Personal history of COVID-19: Secondary | ICD-10-CM | POA: Insufficient documentation

## 2021-05-05 DIAGNOSIS — K439 Ventral hernia without obstruction or gangrene: Secondary | ICD-10-CM | POA: Insufficient documentation

## 2021-05-05 DIAGNOSIS — Z96643 Presence of artificial hip joint, bilateral: Secondary | ICD-10-CM | POA: Insufficient documentation

## 2021-05-05 DIAGNOSIS — I10 Essential (primary) hypertension: Secondary | ICD-10-CM | POA: Insufficient documentation

## 2021-05-05 LAB — LIPASE, BLOOD: Lipase: 32 U/L (ref 11–51)

## 2021-05-05 LAB — MAGNESIUM: Magnesium: 1.5 mg/dL — ABNORMAL LOW (ref 1.7–2.4)

## 2021-05-05 LAB — CBC WITH DIFFERENTIAL/PLATELET
Abs Immature Granulocytes: 0.25 10*3/uL — ABNORMAL HIGH (ref 0.00–0.07)
Basophils Absolute: 0 10*3/uL (ref 0.0–0.1)
Basophils Relative: 0 %
Eosinophils Absolute: 0.1 10*3/uL (ref 0.0–0.5)
Eosinophils Relative: 1 %
HCT: 39.5 % (ref 36.0–46.0)
Hemoglobin: 12.6 g/dL (ref 12.0–15.0)
Immature Granulocytes: 2 %
Lymphocytes Relative: 17 %
Lymphs Abs: 2.2 10*3/uL (ref 0.7–4.0)
MCH: 26.3 pg (ref 26.0–34.0)
MCHC: 31.9 g/dL (ref 30.0–36.0)
MCV: 82.5 fL (ref 80.0–100.0)
Monocytes Absolute: 1.1 10*3/uL — ABNORMAL HIGH (ref 0.1–1.0)
Monocytes Relative: 8 %
Neutro Abs: 9.1 10*3/uL — ABNORMAL HIGH (ref 1.7–7.7)
Neutrophils Relative %: 72 %
Platelets: 119 10*3/uL — ABNORMAL LOW (ref 150–400)
RBC: 4.79 MIL/uL (ref 3.87–5.11)
RDW: 18.5 % — ABNORMAL HIGH (ref 11.5–15.5)
WBC: 12.7 10*3/uL — ABNORMAL HIGH (ref 4.0–10.5)
nRBC: 0.2 % (ref 0.0–0.2)

## 2021-05-05 LAB — COMPREHENSIVE METABOLIC PANEL
ALT: 50 U/L — ABNORMAL HIGH (ref 0–44)
AST: 138 U/L — ABNORMAL HIGH (ref 15–41)
Albumin: 2.9 g/dL — ABNORMAL LOW (ref 3.5–5.0)
Alkaline Phosphatase: 204 U/L — ABNORMAL HIGH (ref 38–126)
Anion gap: 14 (ref 5–15)
BUN: <5 mg/dL — ABNORMAL LOW (ref 6–20)
CO2: 25 mmol/L (ref 22–32)
Calcium: 9.1 mg/dL (ref 8.9–10.3)
Chloride: 100 mmol/L (ref 98–111)
Creatinine, Ser: 0.55 mg/dL (ref 0.44–1.00)
GFR, Estimated: >60 mL/min (ref >60)
Glucose, Bld: 108 mg/dL — ABNORMAL HIGH (ref 70–99)
Potassium: 3.5 mmol/L (ref 3.5–5.1)
Sodium: 139 mmol/L (ref 135–145)
Total Bilirubin: 5.1 mg/dL — ABNORMAL HIGH (ref 0.3–1.2)
Total Protein: 7.8 g/dL (ref 6.5–8.1)

## 2021-05-05 LAB — RESP PANEL BY RT-PCR (FLU A&B, COVID) ARPGX2
Influenza A by PCR: NEGATIVE
Influenza B by PCR: NEGATIVE
SARS Coronavirus 2 by RT PCR: NEGATIVE

## 2021-05-05 LAB — PROTIME-INR
INR: 1.3 — ABNORMAL HIGH (ref 0.8–1.2)
Prothrombin Time: 16 seconds — ABNORMAL HIGH (ref 11.4–15.2)

## 2021-05-05 MED ORDER — IOHEXOL 350 MG/ML SOLN
80.0000 mL | Freq: Once | INTRAVENOUS | Status: AC | PRN
  Administered 2021-05-05: 80 mL via INTRAVENOUS

## 2021-05-05 MED ORDER — SODIUM CHLORIDE 0.9 % IV BOLUS
1000.0000 mL | Freq: Once | INTRAVENOUS | Status: AC
  Administered 2021-05-05: 1000 mL via INTRAVENOUS

## 2021-05-05 MED ORDER — OXYCODONE HCL 5 MG PO TABS: 5.0000 mg | ORAL_TABLET | Freq: Four times a day (QID) | ORAL | 0 refills | Status: DC | PRN

## 2021-05-05 MED ORDER — MAGNESIUM SULFATE 2 GM/50ML IV SOLN
2.0000 g | Freq: Once | INTRAVENOUS | Status: AC
  Administered 2021-05-05: 2 g via INTRAVENOUS
  Filled 2021-05-05: qty 50

## 2021-05-05 MED ORDER — MAGNESIUM SULFATE 50 % IJ SOLN: 2.0000 g | Freq: Once | INTRAMUSCULAR | Status: DC

## 2021-05-05 MED ORDER — ONDANSETRON HCL 4 MG/2ML IJ SOLN
4.0000 mg | Freq: Once | INTRAMUSCULAR | Status: AC
  Administered 2021-05-05: 4 mg via INTRAVENOUS
  Filled 2021-05-05: qty 2

## 2021-05-05 MED ORDER — ONDANSETRON 4 MG PO TBDP: 4.0000 mg | ORAL_TABLET | Freq: Three times a day (TID) | ORAL | 0 refills | Status: DC | PRN

## 2021-05-05 MED ORDER — MORPHINE SULFATE (PF) 4 MG/ML IV SOLN
4.0000 mg | Freq: Once | INTRAVENOUS | Status: AC
Start: 2021-05-05 — End: 2021-05-05
  Administered 2021-05-05: 4 mg via INTRAVENOUS
  Filled 2021-05-05: qty 1

## 2021-05-05 NOTE — Discharge Instructions (Addendum)
I have prescribed you a strong narcotic called oxycodone. Please only take this as prescribed. Do not drive or operate heavy machinery after taking this medication. Do not mix it with alcohol.   I am prescribing you a medication called Zofran.  This is a disintegrating tablet you can use up to 3 times a day for management of your nausea and vomiting.  Please only take this as prescribed.  Please only take this if you are experiencing nausea and vomiting that you cannot control.  Below is the contact information for Dr. Therisa Doyne with [facility] gastroenterology.  Please give them a call soon as possible schedule an appointment for reevaluation.  If you develop any new or worsening symptoms please come back to the emergency department.  It was a pleasure to meet you.

## 2021-05-05 NOTE — ED Triage Notes (Signed)
Pt presents with c/o abdominal pain and vomiting for 3 days.

## 2021-05-05 NOTE — ED Provider Notes (Signed)
[facility] DEPT Provider Note   CSN: [id number] Arrival date & time: 05/05/21  1018     History Chief Complaint  Patient presents with   Abdominal Pain    Marissa Weber is a 60 y.o. female.  HPI  Patient is a 60 year old female with a history of atrial fibrillation, cholecystitis, acute gastroenteritis, who presents to the emergency department due to abdominal pain.  She states her symptoms started about 4 days ago.  States her abdominal pain is diffuse along the upper abdomen and waxes and wanes.  Reports associated nausea with intermittent vomiting as well as intermittent diarrhea.  No hematemesis or hematochezia.  States the vomiting worsens with any p.o. intake and due to this has had little p.o. intake.  Denies any chest pain or shortness of breath.  No urinary complaints.  No fevers, chills, URI symptoms.  She states that she has a history of previous COVID-19 infection last year and has not been vaccinated for COVID-19.     Past Medical History:  Diagnosis Date   Arthritis    hip  and knees    Asthma    hx of years ago    Atrial fibrillation ([place]) 11/2016   Blood transfusion    hx of years ago    Cholecystitis    Dysrhythmia    a fib   Hypertension     Patient Active Problem List   Diagnosis Date Noted   Acute gastroenteritis 01/05/2020   Nausea vomiting and diarrhea 01/05/2020   Hypokalemia due to excessive gastrointestinal loss of potassium 01/05/2020   Hypomagnesemia 01/05/2020   SIRS (systemic inflammatory response syndrome) (HCC) 01/05/2020   Orthostatic syncope 01/05/2020   Abnormal LFTs 01/05/2020   Lactic acidosis 01/05/2020   Intractable nausea and vomiting 01/05/2020   AF (paroxysmal atrial fibrillation) ([place]) 12/16/2016   Hyponatremia 12/16/2016   Anemia in other chronic diseases classified elsewhere 12/16/2016   Leukocytosis 12/16/2016   Closed left hip fracture ([place]) 12/16/2016   Closed displaced fracture of  greater trochanter of left femur (HCC)    OA (osteoarthritis) of hip 12/05/2016   Atrial fibrillation with RVR ([place]) 11/27/2016   Anxiety 11/27/2016   Left hip pain 11/27/2016   Angioedema 04/16/2016   Cholecystitis, acute with cholelithiasis 12/11/2013   Postop Acute blood loss anemia 10/25/2011   Osteoarthritis of hip 10/22/2011    Past Surgical History:  Procedure Laterality Date   ANKLE SURGERY     BACK SURGERY     SPINAL FUSION   CESAREAN SECTION     cesearan     x4   CHOLECYSTECTOMY N/A 12/11/2013   Procedure: LAPAROSCOPIC CHOLECYSTECTOMY WITH INTRAOPERATIVE CHOLANGIOGRAM;  Surgeon: Imogene Burn. Georgette Dover, MD;  Location: [facility];  Service: General;  Laterality: N/A;   LEFT HEART CATH AND CORONARY ANGIOGRAPHY N/A 11/29/2016   Procedure: Left Heart Cath and Coronary Angiography;  Surgeon: [name], MD;  Location: [facility];  Service: Cardiovascular;  Laterality: N/A;   TOTAL HIP ARTHROPLASTY  10/22/2011   Procedure: TOTAL HIP ARTHROPLASTY;  Surgeon: Gearlean Alf, MD;  Location: [facility];  Service: Orthopedics;  Laterality: Right;   TOTAL HIP ARTHROPLASTY Left 12/05/2016   Procedure: LEFT TOTAL HIP ARTHROPLASTY ANTERIOR APPROACH;  Surgeon: Gaynelle Arabian, MD;  Location: [facility];  Service: Orthopedics;  Laterality: Left;     OB History     Gravida  4   Para  4   Term  4   Preterm  AB      Living  5      SAB      IAB      Ectopic      Multiple      Live Births              History reviewed. No pertinent family history.  Social History   Tobacco Use   Smoking status: Every Day    Packs/day: 0.50    Years: 10.00    Pack years: 5.00    Types: Cigarettes   Smokeless tobacco: Never  Vaping Use   Vaping Use: Never used  Substance Use Topics   Alcohol use: Yes    Alcohol/week: 5.0 standard drinks    Types: 1 Glasses of wine, 4 Cans of beer per week    Comment: ocassionally   Drug use: No    Home Medications Prior to Admission medications    Medication Sig Start Date End Date Taking? Authorizing Provider  ondansetron (ZOFRAN ODT) 4 MG disintegrating tablet Take 1 tablet (4 mg total) by mouth every 8 (eight) hours as needed for nausea or vomiting. 05/05/21  Yes Rayna Sexton, PA-C  oxyCODONE (ROXICODONE) 5 MG immediate release tablet Take 1 tablet (5 mg total) by mouth every 6 (six) hours as needed for severe pain. 05/05/21  Yes Rayna Sexton, PA-C  albuterol (PROVENTIL HFA;VENTOLIN HFA) 108 (90 Base) MCG/ACT inhaler Inhale 2 puffs into the lungs every 4 (four) hours as needed for wheezing or shortness of breath.    [provider]  aspirin EC 81 MG tablet Take 162 mg by mouth daily.    [provider]  benzonatate (TESSALON) 100 MG capsule Take 1 capsule (100 mg total) by mouth 3 (three) times daily as needed for cough. 12/05/20   Quintella Reichert, MD  doxycycline (VIBRAMYCIN) 100 MG capsule Take 1 capsule (100 mg total) by mouth 2 (two) times daily. 12/05/20   Quintella Reichert, MD  tiZANidine (ZANAFLEX) 2 MG tablet Take 2 mg by mouth at bedtime as needed for muscle spasms.    [provider]  metoprolol tartrate (LOPRESSOR) 25 MG tablet Take 1 tablet (25 mg total) by mouth 2 (two) times daily. Patient not taking: Reported on 12/05/2020 11/30/16 12/06/20  [name], MD    Allergies    Bee venom and Lisinopril  Review of Systems   Review of Systems  All other systems reviewed and are negative. Ten systems reviewed and are negative for acute change, except as noted in the HPI.   Physical Exam Updated Vital Signs BP 120/86   Pulse (!) 106   Temp 99.1 F (37.3 C) (Oral)   Resp (!) 21   SpO2 97%   Physical Exam Vitals and nursing note reviewed.  Constitutional:      General: She is not in acute distress.    Appearance: Normal appearance. She is well-developed. She is not ill-appearing, toxic-appearing or diaphoretic.  HENT:     Head: Normocephalic and atraumatic.     Right Ear: External ear  normal.     Left Ear: External ear normal.     Nose: Nose normal.     Mouth/Throat:     Mouth: Mucous membranes are moist.     Pharynx: Oropharynx is clear. No oropharyngeal exudate or posterior oropharyngeal erythema.  Eyes:     Extraocular Movements: Extraocular movements intact.  Cardiovascular:     Rate and Rhythm: Normal rate and regular rhythm.     Pulses: Normal pulses.  Heart sounds: Normal heart sounds. No murmur heard.   No friction rub. No gallop.  Pulmonary:     Effort: Pulmonary effort is normal. No respiratory distress.     Breath sounds: Normal breath sounds. No stridor. No wheezing, rhonchi or rales.  Abdominal:     General: Abdomen is flat and protuberant.     Palpations: Abdomen is soft.     Tenderness: There is abdominal tenderness in the right upper quadrant, epigastric area and left upper quadrant.     Hernia: A hernia is present. Hernia is present in the ventral area.  Musculoskeletal:        General: Normal range of motion.     Cervical back: Normal range of motion and neck supple. No tenderness.  Skin:    General: Skin is warm and dry.  Neurological:     General: No focal deficit present.     Mental Status: She is alert and oriented to person, place, and time.  Psychiatric:        Mood and Affect: Mood normal.        Behavior: Behavior normal.    ED Results / Procedures / Treatments   Labs (all labs ordered are listed, but only abnormal results are displayed) Labs Reviewed  CBC WITH DIFFERENTIAL/PLATELET - Abnormal; Notable for the following components:      Result Value   WBC 12.7 (*)    RDW 18.5 (*)    Platelets 119 (*)    Neutro Abs 9.1 (*)    Monocytes Absolute 1.1 (*)    Abs Immature Granulocytes 0.25 (*)    All other components within normal limits  COMPREHENSIVE METABOLIC PANEL - Abnormal; Notable for the following components:   Glucose, Bld 108 (*)    BUN <5 (*)    Albumin 2.9 (*)    AST 138 (*)    ALT 50 (*)    Alkaline  Phosphatase 204 (*)    Total Bilirubin 5.1 (*)    All other components within normal limits  MAGNESIUM - Abnormal; Notable for the following components:   Magnesium 1.5 (*)    All other components within normal limits  PROTIME-INR - Abnormal; Notable for the following components:   Prothrombin Time 16.0 (*)    INR 1.3 (*)    All other components within normal limits  RESP PANEL BY RT-PCR (FLU A&B, COVID) ARPGX2  LIPASE, BLOOD  URINALYSIS, ROUTINE W REFLEX MICROSCOPIC   EKG None  Radiology CT ABDOMEN PELVIS W CONTRAST  Result Date: 05/05/2021 CLINICAL DATA:  Abdominal pain and vomiting for the past 3 days. EXAM: CT ABDOMEN AND PELVIS WITH CONTRAST TECHNIQUE: Multidetector CT imaging of the abdomen and pelvis was performed using the standard protocol following bolus administration of intravenous contrast. CONTRAST:  37m OMNIPAQUE IOHEXOL 350 MG/ML SOLN COMPARISON:  CT abdomen pelvis dated December 05, 2020. FINDINGS: Lower chest: No acute abnormality. Hepatobiliary: Heterogeneous liver with severe steatosis and subtle contour nodularity. Status post cholecystectomy. No biliary dilatation. Pancreas: Unremarkable. No pancreatic ductal dilatation or surrounding inflammatory changes. Spleen: Normal in size without focal abnormality. Adrenals/Urinary Tract: Adrenal glands are unremarkable. Mild bilateral renal cortical scarring. No renal mass, calculi, or hydronephrosis. The bladder is decompressed. Stomach/Bowel: The stomach is within normal limits. New wall thickening and mucosal enhancement of multiple small bowel loops in the abdomen. The colon is unremarkable and mostly decompressed. Normal appendix. Vascular/Lymphatic: Aortic atherosclerosis. No enlarged abdominal or pelvic lymph nodes. Reproductive: Uterus and bilateral adnexa are unremarkable. Other: New  small volume ascites. No pneumoperitoneum. Unchanged fat containing right paraumbilical hernia. Musculoskeletal: No acute or significant osseous  findings. Prior lumbar fusion and bilateral total hip arthroplasties. IMPRESSION: 1. New wall thickening and mucosal enhancement of multiple small bowel loops in the abdomen, consistent with enteritis. 2. Heterogeneous liver with severe steatosis and subtle contour nodularity, suggestive of cirrhosis. New small volume ascites. 3. Aortic Atherosclerosis (ICD10-I70.0). Electronically Signed   By: Titus Dubin M.D.   On: 05/05/2021 20:49    Procedures Procedures   Medications Ordered in ED Medications  sodium chloride 0.9 % bolus 1,000 mL (0 mLs Intravenous Stopped 05/05/21 2253)  morphine 4 MG/ML injection 4 mg (4 mg Intravenous Given 05/05/21 2034)  ondansetron (ZOFRAN) injection 4 mg (4 mg Intravenous Given 05/05/21 2031)  iohexol (OMNIPAQUE) 350 MG/ML injection 80 mL (80 mLs Intravenous Contrast Given 05/05/21 2008)  magnesium sulfate IVPB 2 g 50 mL (0 g Intravenous Stopped 05/05/21 2253)    ED Course  I have reviewed the triage vital signs and the nursing notes.  Pertinent labs & imaging results that were available during my care of the patient were reviewed by me and considered in my medical decision making (see chart for details).  Clinical Course as of 05/05/21 2312  Fri May 05, 2021  2111 Patient discussed with [facility] gastroenterology (Dr. Therisa Doyne).  Recommends we also obtain a PT/INR.  She states that if patient is encephalopathic or has a significant coagulopathy then she would likely require admission, otherwise she can be evaluated outpatient. [LJ]  2113 Comprehensive metabolic panel(!) AST ALT ratio was about 3-1 consistent with patient's alcohol use.  Elevated alk phos of 204 with a total bilirubin of 5.1.  Patient states that she typically drinks about 1, 40 ounce of beer every other night. [LJ]    Clinical Course User Index [LJ] Rayna Sexton, PA-C   MDM Rules/Calculators/A&P                          Pt is a 60 y.o. female who presents to the emergency department due to  nausea/vomiting/diarrhea in addition to upper abdominal pain for the past 4 days.  Labs: CBC with a white blood cell count of 12.7, RDW of 18.5, platelets of 119, neutrophils of 9.1, monocytes 1.1, absolute immature granulocytes of 0.25. CMP with a glucose of 108, BUN less than 5, BUN of 2.9, AST of 138, ALT of 50, alk phos of 204, total bilirubin of 5.1. Lipase of 32. Respiratory panel is negative. Magnesium of 1.5. Prothrombin time of 16 with an INR of 1.3.  Imaging: CT scan of the abdomen/pelvis with contrast shows new wall thickening and mucosal enhancement of multiple small bowel loops in the abdomen consistent with enteritis.  Heterogeneous liver with severe steatosis and subtle contour nodularity suggestive of cirrhosis.  New small volume ascites.  Aortic atherosclerosis.  I, Rayna Sexton, PA-C, personally reviewed and evaluated these images and lab results as part of my medical decision-making.  CBC with leukocytosis of 12.7.  CT scan concerning for enteritis.  Patient treated with IV fluids, Zofran, as well as morphine and notes significant improvement in her symptoms.  CT scan also concerning for severe steatosis and nodularity suggestive of cirrhosis.  Patient reports drinking about 1, 40 ounce beer every other day.  LFTs elevated and consistent with her alcohol use.  Total bilirubin of 5.1.  Patient discussed with Dr. Therisa Doyne who is on-call with gastroenterology.  Recommends we obtain a  PT/INR.  This was obtained and is mildly elevated with a prothrombin time of 16 and an INR of 1.3.  Patient is A&O x3 at this time.  Speaking clearly and coherently.  Does not exhibit signs of hepatic encephalopathy.  Feel that patient is stable for discharge at this time and she is agreeable.  We discussed alcohol cessation at length.  She was given a referral to gastroenterology and I recommended that she call them as soon as possible to schedule an appointment for reevaluation.  We discussed return  precautions.  Will discharge on a course of Zofran for breakthrough nausea/vomiting.  We will also provide a course of oxycodone for breakthrough pain.  We discussed safety regarding this medication.  Her questions were answered and she was amicable at the time of discharge.  Note: Portions of this report may have been transcribed using voice recognition software. Every effort was made to ensure accuracy; however, inadvertent computerized transcription errors may be present.   Final Clinical Impression(s) / ED Diagnoses Final diagnoses:  Enteritis  Cirrhosis of liver with ascites, unspecified hepatic cirrhosis type [facility])   Rx / DC Orders ED Discharge Orders          Ordered    oxyCODONE (ROXICODONE) 5 MG immediate release tablet  Every 6 hours PRN        05/05/21 2307    ondansetron (ZOFRAN ODT) 4 MG disintegrating tablet  Every 8 hours PRN        05/05/21 2307             Rayna Sexton, PA-C 05/05/21 2316    Charlesetta Shanks, MD 05/15/21 [phone]

## 2021-05-06 LAB — URINALYSIS, ROUTINE W REFLEX MICROSCOPIC
Bacteria, UA: NONE SEEN
Glucose, UA: NEGATIVE mg/dL
Hgb urine dipstick: NEGATIVE
Ketones, ur: NEGATIVE mg/dL
Leukocytes,Ua: NEGATIVE
Nitrite: POSITIVE — AB
Protein, ur: NEGATIVE mg/dL
Specific Gravity, Urine: <=1.005 (ref 1.005–1.030)
pH: 7 (ref 5.0–8.0)

## 2021-05-11 ENCOUNTER — Other Ambulatory Visit (INDEPENDENT_AMBULATORY_CARE_PROVIDER_SITE_OTHER): Payer: Medicaid Other

## 2021-05-11 ENCOUNTER — Encounter: Payer: Self-pay | Admitting: Gastroenterology

## 2021-05-11 ENCOUNTER — Ambulatory Visit (INDEPENDENT_AMBULATORY_CARE_PROVIDER_SITE_OTHER): Payer: Medicaid Other | Admitting: Gastroenterology

## 2021-05-11 VITALS — BP 120/50 | HR 75 | Ht 58.5 in | Wt 161.0 lb

## 2021-05-11 DIAGNOSIS — Z1211 Encounter for screening for malignant neoplasm of colon: Secondary | ICD-10-CM | POA: Diagnosis not present

## 2021-05-11 DIAGNOSIS — K529 Noninfective gastroenteritis and colitis, unspecified: Secondary | ICD-10-CM | POA: Diagnosis not present

## 2021-05-11 DIAGNOSIS — Z1212 Encounter for screening for malignant neoplasm of rectum: Secondary | ICD-10-CM | POA: Diagnosis not present

## 2021-05-11 DIAGNOSIS — K7460 Unspecified cirrhosis of liver: Secondary | ICD-10-CM

## 2021-05-11 DIAGNOSIS — K7030 Alcoholic cirrhosis of liver without ascites: Secondary | ICD-10-CM | POA: Diagnosis not present

## 2021-05-11 LAB — PROTIME-INR
INR: 1.4 ratio — ABNORMAL HIGH (ref 0.8–1.0)
Prothrombin Time: 15.6 s — ABNORMAL HIGH (ref 9.6–13.1)

## 2021-05-11 LAB — CBC WITH DIFFERENTIAL/PLATELET
Basophils Absolute: 0.1 10*3/uL (ref 0.0–0.1)
Basophils Relative: 0.4 % (ref 0.0–3.0)
Eosinophils Absolute: 0.1 10*3/uL (ref 0.0–0.7)
Eosinophils Relative: 0.8 % (ref 0.0–5.0)
HCT: 35.7 % — ABNORMAL LOW (ref 36.0–46.0)
Hemoglobin: 11.1 g/dL — ABNORMAL LOW (ref 12.0–15.0)
Lymphocytes Relative: 13.7 % (ref 12.0–46.0)
Lymphs Abs: 1.8 10*3/uL (ref 0.7–4.0)
MCHC: 31.2 g/dL (ref 30.0–36.0)
MCV: 84.6 fl (ref 78.0–100.0)
Monocytes Absolute: 1.6 10*3/uL — ABNORMAL HIGH (ref 0.1–1.0)
Monocytes Relative: 12.5 % — ABNORMAL HIGH (ref 3.0–12.0)
Neutro Abs: 9.3 10*3/uL — ABNORMAL HIGH (ref 1.4–7.7)
Neutrophils Relative %: 72.6 % (ref 43.0–77.0)
Platelets: 117 10*3/uL — ABNORMAL LOW (ref 150.0–400.0)
RBC: 4.22 Mil/uL (ref 3.87–5.11)
RDW: 16.7 % — ABNORMAL HIGH (ref 11.5–15.5)
WBC: 12.8 10*3/uL — ABNORMAL HIGH (ref 4.0–10.5)

## 2021-05-11 LAB — HEPATIC FUNCTION PANEL
ALT: 38 U/L — ABNORMAL HIGH (ref 0–35)
AST: 95 U/L — ABNORMAL HIGH (ref 0–37)
Albumin: 3 g/dL — ABNORMAL LOW (ref 3.5–5.2)
Alkaline Phosphatase: 178 U/L — ABNORMAL HIGH (ref 39–117)
Bilirubin, Direct: 1.6 mg/dL — ABNORMAL HIGH (ref 0.0–0.3)
Total Bilirubin: 3.6 mg/dL — ABNORMAL HIGH (ref 0.2–1.2)
Total Protein: 7.2 g/dL (ref 6.0–8.3)

## 2021-05-11 LAB — IBC PANEL
Iron: 137 ug/dL (ref 42–145)
Saturation Ratios: 82.9 % — ABNORMAL HIGH (ref 20.0–50.0)
TIBC: 165.2 ug/dL — ABNORMAL LOW (ref 250.0–450.0)
Transferrin: 118 mg/dL — ABNORMAL LOW (ref 212.0–360.0)

## 2021-05-11 LAB — FERRITIN: Ferritin: 263.7 ng/mL (ref 10.0–291.0)

## 2021-05-11 LAB — GAMMA GT: GGT: 486 U/L — ABNORMAL HIGH (ref 7–51)

## 2021-05-11 MED ORDER — NA SULFATE-K SULFATE-MG SULF 17.5-3.13-1.6 GM/177ML PO SOLN: 1.0000 | Freq: Once | ORAL | 0 refills | Status: AC

## 2021-05-11 NOTE — Progress Notes (Signed)
HPI : Marissa Weber is a very pleasant 60 year old female with a history of arthritis and paroxysmal A. fib who is referred to [place] following a recent ER visit for abdominal pain and suspected cirrhosis.  She is accompanied by her daughter and her infant granddaughter.  The patient went to the emergency room on September 9 after several days of upper abdominal pain.  The patient states that she had severe abdominal pain in her upper abdomen that "felt like somebody was inside me cutting meat with a knife".  The pain was associated with nausea, vomiting and decreased appetite.  In the emergency room, a CT scan was performed which showed thickening and mucosal enhancement of multiple loops of small bowel, consistent with enteritis.  She was also noted to have a heterogenous appearing liver with severe steatosis and subtle nodularity suggestive of cirrhosis, with small volume ascites.  Her liver enzymes were significantly elevated as well, with an AST of 138, ALT 50, alk phos 204 and total bilirubin of 5.1.  INR was 1.3.  In April, she had presented to the emergency room with bronchitis and was found to have an AST of 400, ALT 119, alk phos 168 and total bilirubin of 0.9. Today, patient states that her abdominal pain and nausea and vomiting have completely resolved.  Her symptoms lasted for a total of several days.  She also had some loose stools at the time, but denied profuse diarrhea.  She also denied fevers or chills.  Currently, her appetite is back to normal with no nausea or vomiting.  She is having some trouble with constipation now. The patient has a history of chronic alcohol use.  The patient tells me that she drinks 1 to 2 40 ounce beers a few times a week, usually just on the weekends.  The daughter states that the patient drinks a lot more than that and has a history of chronic heavy alcohol use. The patient denies any history of yellowing of the eyes or skin.  She does report seeing "blood in her  urine" around the time of her emergency room visit.  On further questioning, it appears that the urine was just dark in color, consistent with bilirubinuria.  She denies a history of swelling in her abdomen, but does have chronic swelling of her legs.  She denies problems with cognition such as memory loss or slowness of thought or speech.  She has a history of paroxysmal atrial fibrillation diagnosed in 2018.  A subsequent nuclear stress test showed possible anterior wall ischemia but a cardiac catheterization showed normal coronaries without significant obstruction.  She was not recommended to take chronic anticoagulation.  Subsequent EKGs have demonstrated persistent sinus rhythm.  An echocardiogram in May 2021 showed an EF of 55 to 60% with moderate LVH.  Past Medical History:  Diagnosis Date   Arthritis    hip  and knees    Asthma    hx of years ago    Atrial fibrillation ([place]) 11/2016   Blood transfusion    hx of years ago    Cholecystitis    Dysrhythmia    a fib   Hypertension      Past Surgical History:  Procedure Laterality Date   ANKLE SURGERY     BACK SURGERY     SPINAL FUSION   CESAREAN SECTION     cesearan     x4   CHOLECYSTECTOMY N/A 12/11/2013   Procedure: LAPAROSCOPIC CHOLECYSTECTOMY WITH INTRAOPERATIVE CHOLANGIOGRAM;  Surgeon: Imogene Burn.  Georgette Dover, MD;  Location: [place];  Service: General;  Laterality: N/A;   LEFT HEART CATH AND CORONARY ANGIOGRAPHY N/A 11/29/2016   Procedure: Left Heart Cath and Coronary Angiography;  Surgeon: [name], MD;  Location: [facility];  Service: Cardiovascular;  Laterality: N/A;   TOTAL HIP ARTHROPLASTY  10/22/2011   Procedure: TOTAL HIP ARTHROPLASTY;  Surgeon: Gearlean Alf, MD;  Location: [facility];  Service: Orthopedics;  Laterality: Right;   TOTAL HIP ARTHROPLASTY Left 12/05/2016   Procedure: LEFT TOTAL HIP ARTHROPLASTY ANTERIOR APPROACH;  Surgeon: Gaynelle Arabian, MD;  Location: [facility];  Service: Orthopedics;  Laterality: Left;    Family History  Problem Relation Age of Onset   Diabetes Mother    Hypertension Father    Social History   Tobacco Use   Smoking status: Every Day    Packs/day: 0.50    Years: 10.00    Pack years: 5.00    Types: Cigarettes   Smokeless tobacco: Never  Vaping Use   Vaping Use: Never used  Substance Use Topics   Alcohol use: Yes    Alcohol/week: 5.0 standard drinks    Types: 1 Glasses of wine, 4 Cans of beer per week    Comment: ocassionally   Drug use: No   Current Outpatient Medications  Medication Sig Dispense Refill   albuterol (PROVENTIL HFA;VENTOLIN HFA) 108 (90 Base) MCG/ACT inhaler Inhale 2 puffs into the lungs every 4 (four) hours as needed for wheezing or shortness of breath.     aspirin EC 81 MG tablet Take 162 mg by mouth daily.     benzonatate (TESSALON) 100 MG capsule Take 1 capsule (100 mg total) by mouth 3 (three) times daily as needed for cough. 15 capsule 0   doxycycline (VIBRAMYCIN) 100 MG capsule Take 1 capsule (100 mg total) by mouth 2 (two) times daily. 14 capsule 0   hydrochlorothiazide (HYDRODIURIL) 25 MG tablet Take by mouth.     metoprolol succinate (TOPROL-XL) 50 MG 24 hr tablet Take 50 mg by mouth every morning.     ondansetron (ZOFRAN ODT) 4 MG disintegrating tablet Take 1 tablet (4 mg total) by mouth every 8 (eight) hours as needed for nausea or vomiting. 8 tablet 0   oxyCODONE (ROXICODONE) 5 MG immediate release tablet Take 1 tablet (5 mg total) by mouth every 6 (six) hours as needed for severe pain. 8 tablet 0   tiZANidine (ZANAFLEX) 2 MG tablet Take 2 mg by mouth at bedtime as needed for muscle spasms.     Vitamin D, Ergocalciferol, (DRISDOL) 1.25 MG (50000 UNIT) CAPS capsule Take 50,000 Units by mouth once a week.     No current facility-administered medications for this visit.   Allergies  Allergen Reactions   Bee Venom Swelling and Other (See Comments)    Swelling wherever the sting is. Also gets dizzy   Lisinopril Swelling     Review  of Systems: All systems reviewed and negative except where noted in HPI.    CT ABDOMEN PELVIS W CONTRAST  Result Date: 05/05/2021 CLINICAL DATA:  Abdominal pain and vomiting for the past 3 days. EXAM: CT ABDOMEN AND PELVIS WITH CONTRAST TECHNIQUE: Multidetector CT imaging of the abdomen and pelvis was performed using the standard protocol following bolus administration of intravenous contrast. CONTRAST:  66m OMNIPAQUE IOHEXOL 350 MG/ML SOLN COMPARISON:  CT abdomen pelvis dated December 05, 2020. FINDINGS: Lower chest: No acute abnormality. Hepatobiliary: Heterogeneous liver with severe steatosis and subtle contour nodularity. Status post cholecystectomy.  No biliary dilatation. Pancreas: Unremarkable. No pancreatic ductal dilatation or surrounding inflammatory changes. Spleen: Normal in size without focal abnormality. Adrenals/Urinary Tract: Adrenal glands are unremarkable. Mild bilateral renal cortical scarring. No renal mass, calculi, or hydronephrosis. The bladder is decompressed. Stomach/Bowel: The stomach is within normal limits. New wall thickening and mucosal enhancement of multiple small bowel loops in the abdomen. The colon is unremarkable and mostly decompressed. Normal appendix. Vascular/Lymphatic: Aortic atherosclerosis. No enlarged abdominal or pelvic lymph nodes. Reproductive: Uterus and bilateral adnexa are unremarkable. Other: New small volume ascites. No pneumoperitoneum. Unchanged fat containing right paraumbilical hernia. Musculoskeletal: No acute or significant osseous findings. Prior lumbar fusion and bilateral total hip arthroplasties. IMPRESSION: 1. New wall thickening and mucosal enhancement of multiple small bowel loops in the abdomen, consistent with enteritis. 2. Heterogeneous liver with severe steatosis and subtle contour nodularity, suggestive of cirrhosis. New small volume ascites. 3. Aortic Atherosclerosis (ICD10-I70.0). Electronically Signed   By: Titus Dubin M.D.   On:  05/05/2021 20:49    Physical Exam: BP (!) 120/50   Pulse 75   Ht 4' 10.5" (1.486 m)   Wt 161 lb (73 kg)   BMI 33.08 kg/m  Constitutional: Pleasant,well-developed, African-American female in no acute distress, accompanied by daughter and granddaughter HEENT: Normocephalic and atraumatic. Conjunctivae are normal.  Hyperpigmentation of the sclera but no scleral icterus.  No sublingual jaundice noted Cardiovascular: Normal rate, regular rhythm.  Pulmonary/chest: Effort normal and breath sounds normal. No wheezing, rales or rhonchi. Abdominal: Soft, nondistended, nontender. Bowel sounds active throughout. There are no masses palpable.  Extremities: Trace bilateral lower extremity edema Neurological: Alert and oriented to person place and time.  No asterixis Skin: Skin is warm and dry. No rashes noted.  No jaundice Psychiatric: Normal mood and affect. Behavior is normal.  CBC    Component Value Date/Time   WBC 12.7 (H) 05/05/2021 1835   RBC 4.79 05/05/2021 1835   HGB 12.6 05/05/2021 1835   HCT 39.5 05/05/2021 1835   HCT 41.7 01/06/2020 1636   PLT 119 (L) 05/05/2021 1835   MCV 82.5 05/05/2021 1835   MCH 26.3 05/05/2021 1835   MCHC 31.9 05/05/2021 1835   RDW 18.5 (H) 05/05/2021 1835   LYMPHSABS 2.2 05/05/2021 1835   MONOABS 1.1 (H) 05/05/2021 1835   EOSABS 0.1 05/05/2021 1835   BASOSABS 0.0 05/05/2021 1835    CMP     Component Value Date/Time   NA 139 05/05/2021 1835   K 3.5 05/05/2021 1835   CL 100 05/05/2021 1835   CO2 25 05/05/2021 1835   GLUCOSE 108 (H) 05/05/2021 1835   BUN <5 (L) 05/05/2021 1835   CREATININE 0.55 05/05/2021 1835   CALCIUM 9.1 05/05/2021 1835   PROT 7.8 05/05/2021 1835   ALBUMIN 2.9 (L) 05/05/2021 1835   AST 138 (H) 05/05/2021 1835   ALT 50 (H) 05/05/2021 1835   ALKPHOS 204 (H) 05/05/2021 1835   BILITOT 5.1 (H) 05/05/2021 1835   GFRNONAA >60 05/05/2021 1835   GFRAA >60 01/10/2020 0856     ASSESSMENT AND PLAN: 60 year old female with history  of alcohol abuse with recent ER visit for abdominal pain with nausea and vomiting, with CT evidence of enteritis, as well as cirrhosis with hyperbilirubinemia.  Her symptoms of abdominal pain, nausea/vomiting resolved after a few days, and and were most likely due to an infectious enteritis.  Given complete resolution of her symptoms, I do not think further evaluation of her enteritis is necessary at this time. The patient most  likely does have cirrhosis secondary to chronic alcohol abuse.  We will evaluate for other causes of chronic liver disease as well.  We discussed the significance of cirrhosis, the pathophysiology involved and the various complications of cirrhosis.  We discussed the principles of cirrhosis management which involve eliminating further insults to the liver and preventing further complications.  The patient did have small amounts of ascites seen on her CT scan, however this was also in the setting of zoomed infectious enteritis, which confounds the presence of fluid in the abdominal space.  The patient understands that complete abstinence is required in order to limit further progression of her liver disease and she states she is willing to do this.  We will get elastography to confirm the presence of cirrhosis.  We will tentatively plan for an upper endoscopy for variceal screening.  She is also overdue for her initial average screening colonoscopy.  Alcoholic liver disease, suspected cirrhosis -Elastography - Evaluate for other causes of chronic liver disease (autoimmune, viral, metabolic/hereditary) - Recheck MELD labs - EGD for variceal screening - Patient understands and agrees necessity for complete abstinence from alcohol going forward  Colon cancer screening - Schedule patient for initial average risk colon cancer screening  Enteritis, presumed infectious - Resolved, no further evaluation recommended  The details, risks (including bleeding, perforation, infection, missed  lesions, medication reactions and possible hospitalization or surgery if complications occur), benefits, and alternatives to EGD/colonoscopy with possible biopsy and possible polypectomy were discussed with the patient and she consents to proceed.   I spent a total of 55 minutes reviewing the patient's medical record, interviewing and examining the patient, discussing her diagnosis and management of her condition going forward, and documenting in the medical record   Xabi Wittler E. Candis Schatz, [facility]     [name], MD

## 2021-05-11 NOTE — Patient Instructions (Addendum)
If you are age 60 or older, your body mass index should be between 23-30. Your Body mass index is 33.08 kg/m. If this is out of the aforementioned range listed, please consider follow up with your Primary Care Provider.  If you are age 68 or younger, your body mass index should be between 19-25. Your Body mass index is 33.08 kg/m. If this is out of the aformentioned range listed, please consider follow up with your Primary Care Provider.   You have been scheduled for a colonoscopy. Please follow written instructions given to you at your visit today.  Please pick up your prep supplies at the pharmacy within the next 1-3 days. If you use inhalers (even only as needed), please bring them with you on the day of your procedure.   I will call you once Ultrasound is Scheduled.  Your provider has requested that you go to the basement level for lab work before leaving today. Press "B" on the elevator. The lab is located at the first door on the left as you exit the elevator.  Due to recent changes in healthcare laws, you may see the results of your imaging and laboratory studies on MyChart before your provider has had a chance to review them.  We understand that in some cases there may be results that are confusing or concerning to you. Not all laboratory results come back in the same time frame and the provider may be waiting for multiple results in order to interpret others.  Please give [place] 48 hours in order for your provider to thoroughly review all the results before contacting the office for clarification of your results.   The [facility] providers would like to encourage you to use [facility] to communicate with providers for non-urgent requests or questions.  Due to long hold times on the telephone, sending your provider a message by [facility] may be a faster and more efficient way to get a response.  Please allow 48 business hours for a response.  Please remember that this is for non-urgent requests.    It was a pleasure to see you today!  Thank you for trusting me with your gastrointestinal care!    Scott E. Candis Schatz, MD

## 2021-05-12 ENCOUNTER — Telehealth: Payer: Self-pay

## 2021-05-12 NOTE — Telephone Encounter (Signed)
Called and spoke with patients daughter Driscilla Moats and let her know that she has been scheduled for a Ultrasound 05/24/21 @ 10am with a 9:45 arrival time. NPO after midnight the night before.

## 2021-05-17 LAB — HEPATITIS C ANTIBODY
Hepatitis C Ab: NONREACTIVE
SIGNAL TO CUT-OFF: 0.22 (ref <1.00)

## 2021-05-17 LAB — ANTI-SMOOTH MUSCLE ANTIBODY, IGG: Actin (Smooth Muscle) Antibody (IGG): 28 U — ABNORMAL HIGH (ref <20)

## 2021-05-17 LAB — CERULOPLASMIN: Ceruloplasmin: 22 mg/dL (ref 18–53)

## 2021-05-17 LAB — ALPHA-1-ANTITRYPSIN: A-1 Antitrypsin, Ser: 188 mg/dL (ref 83–199)

## 2021-05-17 LAB — HEPATITIS A ANTIBODY, TOTAL: Hepatitis A AB,Total: NONREACTIVE

## 2021-05-17 LAB — ANA: Anti Nuclear Antibody (ANA): NEGATIVE

## 2021-05-17 LAB — MITOCHONDRIAL ANTIBODIES: Mitochondrial M2 Ab, IgG: <=20 U (ref <=20.0)

## 2021-05-17 LAB — HEPATITIS B SURFACE ANTIBODY,QUALITATIVE: Hep B S Ab: NONREACTIVE

## 2021-05-17 LAB — HEPATITIS B SURFACE ANTIGEN: Hepatitis B Surface Ag: NONREACTIVE

## 2021-05-24 ENCOUNTER — Ambulatory Visit (HOSPITAL_COMMUNITY)
Admission: RE | Admit: 2021-05-24 | Discharge: 2021-05-24 | Disposition: A | Discharge status: Home / Self Care | Payer: Medicaid Other | Source: Ambulatory Visit | Attending: Gastroenterology | Admitting: Gastroenterology

## 2021-05-24 ENCOUNTER — Other Ambulatory Visit: Payer: Self-pay

## 2021-05-24 DIAGNOSIS — Z1211 Encounter for screening for malignant neoplasm of colon: Secondary | ICD-10-CM | POA: Diagnosis present

## 2021-05-24 DIAGNOSIS — Z1212 Encounter for screening for malignant neoplasm of rectum: Secondary | ICD-10-CM | POA: Insufficient documentation

## 2021-05-24 DIAGNOSIS — K7030 Alcoholic cirrhosis of liver without ascites: Secondary | ICD-10-CM | POA: Insufficient documentation

## 2021-06-29 ENCOUNTER — Ambulatory Visit (AMBULATORY_SURGERY_CENTER): Payer: Medicaid Other | Admitting: Gastroenterology

## 2021-06-29 ENCOUNTER — Inpatient Hospital Stay (HOSPITAL_COMMUNITY): Payer: Medicaid Other

## 2021-06-29 ENCOUNTER — Encounter (HOSPITAL_COMMUNITY): Payer: Self-pay

## 2021-06-29 ENCOUNTER — Inpatient Hospital Stay (HOSPITAL_COMMUNITY)
Admission: EM | Admit: 2021-06-29 | Discharge: 2021-07-03 | DRG: 378 | Disposition: A | Discharge status: Home / Self Care | Payer: Medicaid Other | Source: Other Acute Inpatient Hospital | Attending: Cardiovascular Disease | Admitting: Cardiovascular Disease

## 2021-06-29 ENCOUNTER — Encounter: Payer: Self-pay | Admitting: Gastroenterology

## 2021-06-29 ENCOUNTER — Other Ambulatory Visit: Payer: Self-pay

## 2021-06-29 VITALS — BP 117/72 | HR 92 | Temp 97.3°F | Resp 16 | Ht 58.5 in | Wt 161.0 lb

## 2021-06-29 DIAGNOSIS — F1010 Alcohol abuse, uncomplicated: Secondary | ICD-10-CM | POA: Diagnosis present

## 2021-06-29 DIAGNOSIS — E876 Hypokalemia: Secondary | ICD-10-CM | POA: Diagnosis not present

## 2021-06-29 DIAGNOSIS — Z96643 Presence of artificial hip joint, bilateral: Secondary | ICD-10-CM | POA: Diagnosis present

## 2021-06-29 DIAGNOSIS — I10 Essential (primary) hypertension: Secondary | ICD-10-CM | POA: Diagnosis present

## 2021-06-29 DIAGNOSIS — K7030 Alcoholic cirrhosis of liver without ascites: Secondary | ICD-10-CM | POA: Diagnosis present

## 2021-06-29 DIAGNOSIS — K922 Gastrointestinal hemorrhage, unspecified: Secondary | ICD-10-CM

## 2021-06-29 DIAGNOSIS — Z6833 Body mass index (BMI) 33.0-33.9, adult: Secondary | ICD-10-CM

## 2021-06-29 DIAGNOSIS — Z79899 Other long term (current) drug therapy: Secondary | ICD-10-CM | POA: Diagnosis not present

## 2021-06-29 DIAGNOSIS — I422 Other hypertrophic cardiomyopathy: Secondary | ICD-10-CM | POA: Diagnosis present

## 2021-06-29 DIAGNOSIS — I480 Paroxysmal atrial fibrillation: Secondary | ICD-10-CM | POA: Diagnosis present

## 2021-06-29 DIAGNOSIS — Z833 Family history of diabetes mellitus: Secondary | ICD-10-CM | POA: Diagnosis not present

## 2021-06-29 DIAGNOSIS — D689 Coagulation defect, unspecified: Secondary | ICD-10-CM | POA: Diagnosis present

## 2021-06-29 DIAGNOSIS — K766 Portal hypertension: Secondary | ICD-10-CM | POA: Diagnosis present

## 2021-06-29 DIAGNOSIS — K254 Chronic or unspecified gastric ulcer with hemorrhage: Secondary | ICD-10-CM | POA: Diagnosis present

## 2021-06-29 DIAGNOSIS — Z888 Allergy status to other drugs, medicaments and biological substances status: Secondary | ICD-10-CM

## 2021-06-29 DIAGNOSIS — E669 Obesity, unspecified: Secondary | ICD-10-CM | POA: Diagnosis present

## 2021-06-29 DIAGNOSIS — K317 Polyp of stomach and duodenum: Secondary | ICD-10-CM | POA: Diagnosis present

## 2021-06-29 DIAGNOSIS — D62 Acute posthemorrhagic anemia: Secondary | ICD-10-CM | POA: Diagnosis present

## 2021-06-29 DIAGNOSIS — Z91030 Bee allergy status: Secondary | ICD-10-CM

## 2021-06-29 DIAGNOSIS — F17210 Nicotine dependence, cigarettes, uncomplicated: Secondary | ICD-10-CM | POA: Diagnosis present

## 2021-06-29 DIAGNOSIS — K259 Gastric ulcer, unspecified as acute or chronic, without hemorrhage or perforation: Secondary | ICD-10-CM | POA: Diagnosis not present

## 2021-06-29 DIAGNOSIS — Z20822 Contact with and (suspected) exposure to covid-19: Secondary | ICD-10-CM | POA: Diagnosis present

## 2021-06-29 DIAGNOSIS — Z7982 Long term (current) use of aspirin: Secondary | ICD-10-CM

## 2021-06-29 DIAGNOSIS — Z981 Arthrodesis status: Secondary | ICD-10-CM | POA: Diagnosis not present

## 2021-06-29 DIAGNOSIS — K449 Diaphragmatic hernia without obstruction or gangrene: Secondary | ICD-10-CM | POA: Diagnosis present

## 2021-06-29 DIAGNOSIS — R0602 Shortness of breath: Secondary | ICD-10-CM | POA: Diagnosis present

## 2021-06-29 DIAGNOSIS — Z8249 Family history of ischemic heart disease and other diseases of the circulatory system: Secondary | ICD-10-CM

## 2021-06-29 DIAGNOSIS — J45909 Unspecified asthma, uncomplicated: Secondary | ICD-10-CM | POA: Diagnosis present

## 2021-06-29 DIAGNOSIS — Z1211 Encounter for screening for malignant neoplasm of colon: Secondary | ICD-10-CM

## 2021-06-29 LAB — CBC WITH DIFFERENTIAL/PLATELET
Abs Immature Granulocytes: 0.05 10*3/uL (ref 0.00–0.07)
Basophils Absolute: 0.1 10*3/uL (ref 0.0–0.1)
Basophils Relative: 1 %
Eosinophils Absolute: 0.2 10*3/uL (ref 0.0–0.5)
Eosinophils Relative: 2 %
HCT: 31.8 % — ABNORMAL LOW (ref 36.0–46.0)
Hemoglobin: 10.2 g/dL — ABNORMAL LOW (ref 12.0–15.0)
Immature Granulocytes: 0 %
Lymphocytes Relative: 29 %
Lymphs Abs: 3.5 10*3/uL (ref 0.7–4.0)
MCH: 22.9 pg — ABNORMAL LOW (ref 26.0–34.0)
MCHC: 32.1 g/dL (ref 30.0–36.0)
MCV: 71.3 fL — ABNORMAL LOW (ref 80.0–100.0)
Monocytes Absolute: 1.2 10*3/uL — ABNORMAL HIGH (ref 0.1–1.0)
Monocytes Relative: 10 %
Neutro Abs: 7 10*3/uL (ref 1.7–7.7)
Neutrophils Relative %: 58 %
Platelets: 246 10*3/uL (ref 150–400)
RBC: 4.46 MIL/uL (ref 3.87–5.11)
RDW: 16.1 % — ABNORMAL HIGH (ref 11.5–15.5)
WBC: 12 10*3/uL — ABNORMAL HIGH (ref 4.0–10.5)
nRBC: 0 % (ref 0.0–0.2)

## 2021-06-29 LAB — COMPREHENSIVE METABOLIC PANEL
ALT: 16 U/L (ref 0–44)
AST: 52 U/L — ABNORMAL HIGH (ref 15–41)
Albumin: 2.6 g/dL — ABNORMAL LOW (ref 3.5–5.0)
Alkaline Phosphatase: 87 U/L (ref 38–126)
Anion gap: 7 (ref 5–15)
BUN: <5 mg/dL — ABNORMAL LOW (ref 6–20)
CO2: 23 mmol/L (ref 22–32)
Calcium: 9.1 mg/dL (ref 8.9–10.3)
Chloride: 106 mmol/L (ref 98–111)
Creatinine, Ser: 0.47 mg/dL (ref 0.44–1.00)
GFR, Estimated: >60 mL/min (ref >60)
Glucose, Bld: 90 mg/dL (ref 70–99)
Potassium: 4 mmol/L (ref 3.5–5.1)
Sodium: 136 mmol/L (ref 135–145)
Total Bilirubin: 2.1 mg/dL — ABNORMAL HIGH (ref 0.3–1.2)
Total Protein: 7 g/dL (ref 6.5–8.1)

## 2021-06-29 LAB — RESP PANEL BY RT-PCR (FLU A&B, COVID) ARPGX2
Influenza A by PCR: NEGATIVE
Influenza B by PCR: NEGATIVE
SARS Coronavirus 2 by RT PCR: NEGATIVE

## 2021-06-29 LAB — APTT: aPTT: 51 seconds — ABNORMAL HIGH (ref 24–36)

## 2021-06-29 LAB — TYPE AND SCREEN
ABO/RH(D): O POS
Antibody Screen: NEGATIVE

## 2021-06-29 LAB — PROTIME-INR
INR: 1.4 — ABNORMAL HIGH (ref 0.8–1.2)
Prothrombin Time: 16.7 seconds — ABNORMAL HIGH (ref 11.4–15.2)

## 2021-06-29 MED ORDER — TIZANIDINE HCL 4 MG PO TABS
2.0000 mg | ORAL_TABLET | Freq: Every evening | ORAL | Status: DC | PRN
  Administered 2021-06-30 – 2021-07-02 (×3): 2 mg via ORAL
  Filled 2021-06-29 (×3): qty 1

## 2021-06-29 MED ORDER — PANTOPRAZOLE INFUSION (NEW) - SIMPLE MED
8.0000 mg/h | INTRAVENOUS | Status: AC
  Administered 2021-06-29 – 2021-07-02 (×2): 8 mg/h via INTRAVENOUS
  Filled 2021-06-29 (×2): qty 80
  Filled 2021-06-29: qty 100

## 2021-06-29 MED ORDER — PANTOPRAZOLE 80MG IVPB - SIMPLE MED
80.0000 mg | Freq: Once | INTRAVENOUS | Status: AC
  Administered 2021-06-29: 80 mg via INTRAVENOUS
  Filled 2021-06-29: qty 80

## 2021-06-29 MED ORDER — LACTATED RINGERS IV SOLN: INTRAVENOUS | Status: DC

## 2021-06-29 MED ORDER — ALBUTEROL SULFATE (2.5 MG/3ML) 0.083% IN NEBU: 3.0000 mL | INHALATION_SOLUTION | RESPIRATORY_TRACT | Status: DC | PRN

## 2021-06-29 MED ORDER — SODIUM CHLORIDE 0.9 % IV SOLN: 500.0000 mL | Freq: Once | INTRAVENOUS | Status: DC

## 2021-06-29 MED ORDER — SODIUM CHLORIDE 0.9 % IV SOLN
1.0000 g | Freq: Once | INTRAVENOUS | Status: AC
  Administered 2021-06-29: 1 g via INTRAVENOUS
  Filled 2021-06-29: qty 10

## 2021-06-29 MED ORDER — FENTANYL CITRATE PF 50 MCG/ML IJ SOSY
50.0000 ug | PREFILLED_SYRINGE | INTRAMUSCULAR | Status: AC | PRN
  Administered 2021-06-29 – 2021-06-30 (×3): 50 ug via INTRAVENOUS
  Filled 2021-06-29 (×3): qty 1

## 2021-06-29 MED ORDER — SODIUM CHLORIDE 0.9 % IV SOLN: INTRAVENOUS | Status: AC

## 2021-06-29 NOTE — ED Provider Notes (Signed)
[facility] EMERGENCY DEPARTMENT Provider Note   CSN: [id number] Arrival date & time: 06/29/21  1559     History Chief Complaint  Patient presents with   GI Problem    Marissa Weber is a 60 y.o. female.  HPI    60 year old female comes in with chief complaint of GI problems. Patient has history of A. fib, hypertension, alcoholic liver disease with portal hypertension but no esophageal varices.  She comes to the ER after her endoscopy revealed that she had active blood in the stomach.  Patient states that she noted some bloody stools when she was prepping herself for the scope itself.  Prior to that, she did not have any melena or bloody stools.  Patient denies any hematemesis.  Currently she is not on any blood thinners.  Past Medical History:  Diagnosis Date   Arthritis    hip  and knees    Asthma    hx of years ago    Atrial fibrillation ([place]) 11/2016   Blood transfusion    hx of years ago    Cholecystitis    Dysrhythmia    a fib   Hypertension     Patient Active Problem List   Diagnosis Date Noted   Acute upper GI bleed 06/29/2021   Acute gastroenteritis 01/05/2020   Nausea vomiting and diarrhea 01/05/2020   Hypokalemia due to excessive gastrointestinal loss of potassium 01/05/2020   Hypomagnesemia 01/05/2020   SIRS (systemic inflammatory response syndrome) (HCC) 01/05/2020   Orthostatic syncope 01/05/2020   Abnormal LFTs 01/05/2020   Lactic acidosis 01/05/2020   Intractable nausea and vomiting 01/05/2020   AF (paroxysmal atrial fibrillation) ([place]) 12/16/2016   Hyponatremia 12/16/2016   Anemia in other chronic diseases classified elsewhere 12/16/2016   Leukocytosis 12/16/2016   Closed left hip fracture ([place]) 12/16/2016   Closed displaced fracture of greater trochanter of left femur ([place])    OA (osteoarthritis) of hip 12/05/2016   Atrial fibrillation with RVR ([place]) 11/27/2016   Anxiety 11/27/2016   Left hip pain 11/27/2016   Angioedema  04/16/2016   Cholecystitis, acute with cholelithiasis 12/11/2013   Postop Acute blood loss anemia 10/25/2011   Osteoarthritis of hip 10/22/2011    Past Surgical History:  Procedure Laterality Date   ANKLE SURGERY     BACK SURGERY     SPINAL FUSION   CESAREAN SECTION     cesearan     x4   CHOLECYSTECTOMY N/A 12/11/2013   Procedure: LAPAROSCOPIC CHOLECYSTECTOMY WITH INTRAOPERATIVE CHOLANGIOGRAM;  Surgeon: Imogene Burn. Georgette Dover, MD;  Location: [place];  Service: General;  Laterality: N/A;   LEFT HEART CATH AND CORONARY ANGIOGRAPHY N/A 11/29/2016   Procedure: Left Heart Cath and Coronary Angiography;  Surgeon: [name], MD;  Location: [place] [facility];  Service: Cardiovascular;  Laterality: N/A;   TOTAL HIP ARTHROPLASTY  10/22/2011   Procedure: TOTAL HIP ARTHROPLASTY;  Surgeon: Gearlean Alf, MD;  [facility];  Service: Orthopedics;  Laterality: Right;   TOTAL HIP ARTHROPLASTY Left 12/05/2016   Procedure: LEFT TOTAL HIP ARTHROPLASTY ANTERIOR APPROACH;  Surgeon: Gaynelle Arabian, MD;  Location[facility];  Service: Orthopedics;  Laterality: Left;     OB History     Gravida  4   Para  4   Term  4   Preterm      AB      Living  5      SAB      IAB  Ectopic      Multiple      Live Births              Family History  Problem Relation Age of Onset   Diabetes Mother    Hypertension Father    Colon cancer Neg Hx    Esophageal cancer Neg Hx    Rectal cancer Neg Hx    Stomach cancer Neg Hx     Social History   Tobacco Use   Smoking status: Every Day    Packs/day: 0.50    Years: 10.00    Pack years: 5.00    Types: Cigarettes   Smokeless tobacco: Never  Vaping Use   Vaping Use: Never used  Substance Use Topics   Alcohol use: Yes    Alcohol/week: 5.0 standard drinks    Types: 1 Glasses of wine, 4 Cans of beer per week    Comment: ocassionally   Drug use: No    Home Medications Prior to Admission medications   Medication Sig Start Date End Date  Taking? Authorizing Provider  albuterol (PROVENTIL HFA;VENTOLIN HFA) 108 (90 Base) MCG/ACT inhaler Inhale 2 puffs into the lungs every 4 (four) hours as needed for wheezing or shortness of breath.   Yes [provider]  aspirin EC 81 MG tablet Take 162 mg by mouth daily.   Yes [provider]  metoprolol succinate (TOPROL-XL) 50 MG 24 hr tablet Take 50 mg by mouth every morning. 04/30/21  Yes [provider]  tiZANidine (ZANAFLEX) 2 MG tablet Take 2 mg by mouth at bedtime as needed for muscle spasms.   Yes [provider]  Vitamin D, Ergocalciferol, (DRISDOL) 1.25 MG (50000 UNIT) CAPS capsule Take 50,000 Units by mouth once a week. 04/03/21  Yes [provider]  metoprolol tartrate (LOPRESSOR) 25 MG tablet Take 1 tablet (25 mg total) by mouth 2 (two) times daily. Patient not taking: Reported on 12/05/2020 11/30/16 12/06/20  [name], MD    Allergies    Bee venom and Lisinopril  Review of Systems   Review of Systems  Constitutional:  Positive for activity change.  Respiratory:  Negative for shortness of breath.   Cardiovascular:  Negative for chest pain.  Gastrointestinal:  Positive for blood in stool.  Hematological:  Does not bruise/bleed easily.  All other systems reviewed and are negative.  Physical Exam Updated Vital Signs BP 112/73   Pulse 95   Temp 98 F (36.7 C) (Oral)   Resp 20   Ht 4\' 10"  (1.473 m)   Wt 73 kg   SpO2 96%   BMI 33.65 kg/m   Physical Exam Vitals and nursing note reviewed.  Constitutional:      Appearance: She is well-developed.  HENT:     Head: Atraumatic.  Cardiovascular:     Rate and Rhythm: Normal rate.  Pulmonary:     Effort: Pulmonary effort is normal.  Abdominal:     Tenderness: There is no abdominal tenderness.  Musculoskeletal:     Cervical back: Normal range of motion and neck supple.  Skin:    General: Skin is warm and dry.  Neurological:     Mental Status: She is alert and oriented to  person, place, and time.    ED Results / Procedures / Treatments   Labs (all labs ordered are listed, but only abnormal results are displayed) Labs Reviewed  COMPREHENSIVE METABOLIC PANEL - Abnormal; Notable for the following components:      Result Value  BUN <5 (*)    Albumin 2.6 (*)    AST 52 (*)    Total Bilirubin 2.1 (*)    All other components within normal limits  CBC WITH DIFFERENTIAL/PLATELET - Abnormal; Notable for the following components:   WBC 12.0 (*)    Hemoglobin 10.2 (*)    HCT 31.8 (*)    MCV 71.3 (*)    MCH 22.9 (*)    RDW 16.1 (*)    Monocytes Absolute 1.2 (*)    All other components within normal limits  PROTIME-INR - Abnormal; Notable for the following components:   Prothrombin Time 16.7 (*)    INR 1.4 (*)    All other components within normal limits  APTT - Abnormal; Notable for the following components:   aPTT 51 (*)    All other components within normal limits  RESP PANEL BY RT-PCR (FLU A&B, COVID) ARPGX2  HIV ANTIBODY (ROUTINE TESTING W REFLEX)  CBC  PROTIME-INR  APTT  COMPREHENSIVE METABOLIC PANEL  TYPE AND SCREEN    EKG EKG Interpretation  Date/Time:  Thursday June 29 2021 16:05:29 EDT Ventricular Rate:  87 PR Interval:  152 QRS Duration: 96 QT Interval:  414 QTC Calculation: 499 R Axis:   81 Text Interpretation: Sinus rhythm Low voltage, extremity and precordial leads Consider inferior infarct No acute changes No significant change since last tracing Confirmed by Varney Biles [phone]) on 06/29/2021 5:30:30 PM  Radiology Portable chest 1 View  Result Date: 06/29/2021 CLINICAL DATA:  Esophageal varices EXAM: PORTABLE CHEST 1 VIEW COMPARISON:  12/05/2020 FINDINGS: The heart size and mediastinal contours are within normal limits. Both lungs are clear. The visualized skeletal structures are unremarkable. IMPRESSION: No active disease. Electronically Signed   By: Fidela Salisbury M.D.   On: 06/29/2021 19:46    Procedures Procedures    Medications Ordered in ED Medications  pantoprozole (PROTONIX) 80 mg /NS 100 mL infusion (8 mg/hr Intravenous New Bag/Given 06/29/21 1744)  lactated ringers infusion ( Intravenous New Bag/Given 06/29/21 1639)  fentaNYL (SUBLIMAZE) injection 50 mcg (50 mcg Intravenous Given 06/29/21 1652)  0.9 %  sodium chloride infusion (has no administration in time range)  albuterol (PROVENTIL) (2.5 MG/3ML) 0.083% nebulizer solution 3 mL (has no administration in time range)  tiZANidine (ZANAFLEX) tablet 2 mg (has no administration in time range)  pantoprazole (PROTONIX) 80 mg /NS 100 mL IVPB (0 mg Intravenous Stopped 06/29/21 1744)  cefTRIAXone (ROCEPHIN) 1 g in sodium chloride 0.9 % 100 mL IVPB (0 g Intravenous Stopped 06/29/21 1707)    ED Course  I have reviewed the triage vital signs and the nursing notes.  Pertinent labs & imaging results that were available during my care of the patient were reviewed by me and considered in my medical decision making (see chart for details).    MDM Rules/Calculators/A&P                          60 year old female comes in with chief complaint of abnormal upper endoscopy.  She has a history of A. fib, alcoholic liver disease without esophageal varices.  It does not appear that she is on any blood thinners.  Earlier today she had upper endoscopy that revealed fresh blood in the gastric fundus.  Patient denies any abdominal pain at this time.  No active bloody stools.  We will give her IV Protonix, IV ceftriaxone and admit her to hospitalist.  Final Clinical Impression(s) / ED Diagnoses Final diagnoses:  Acute upper GI bleed    Rx / DC Orders ED Discharge Orders     None        Varney Biles, MD 06/29/21 2036

## 2021-06-29 NOTE — ED Triage Notes (Signed)
[facility] reports pt coming from [facility] today and found to have esophageal varices ruptured and stomach full of blood. Pt noticed some blood in her stools this AM prior. Pt with rectal bleeding at this time. Pt c/o upper abdominal pain.

## 2021-06-29 NOTE — Op Note (Signed)
[place] Patient Name: Geonna Lockyer Procedure Date: 06/29/2021 2:23 PM MRN: [id number] Endoscopist: Nicki Reaper E. Candis Schatz , MD Age: 60 Referring MD:  Date of Birth: 1961/03/19 Gender: Female Account #: [id number] Procedure:                Upper GI endoscopy Indications:              Cirrhosis rule out esophageal varices Medicines:                Monitored Anesthesia Care Procedure:                Pre-Anesthesia Assessment:                           - Prior to the procedure, a History and Physical                            was performed, and patient medications and                            allergies were reviewed. The patient's tolerance of                            previous anesthesia was also reviewed. The risks                            and benefits of the procedure and the sedation                            options and risks were discussed with the patient.                            All questions were answered, and informed consent                            was obtained. Prior Anticoagulants: The patient has                            taken no previous anticoagulant or antiplatelet                            agents. ASA Grade Assessment: III - A patient with                            severe systemic disease. After reviewing the risks                            and benefits, the patient was deemed in                            satisfactory condition to undergo the procedure.                           After obtaining informed consent, the endoscope was  passed under direct vision. Throughout the                            procedure, the patient's blood pressure, pulse, and                            oxygen saturations were monitored continuously. The                            GIF [id number] #[id number] was introduced through the                            mouth, and advanced to the body of the stomach. The                            upper GI  endoscopy was accomplished without                            difficulty. The patient tolerated the procedure                            well. Scope In: Scope Out: Findings:                 The examined esophagus was normal.                           Bright red and clotted blood was found in the                            gastric fundus and in the gastric body.                           A single spot with active bleeding was found in the                            cardia. For hemostasis, one hemostatic clip was                            successfully placed (MR conditional). There was no                            bleeding at the end of the procedure. Complications:            No immediate complications. Estimated Blood Loss:     Estimated blood loss: none. Impression:               - Normal esophagus.                           - A large amount of bright red and freshly clotted                            blood in the gastric fundus and in the gastric body.                           -  A single spot with bleeding in the gastric                            cardia. Clip (MR conditional) was placed. This did                            not appear to be an ulcer or a varix, possible                            [place]?                           - No specimens collected. Recommendation:           - Transport patient to ER for further observation                            and treatment of active upper GI bleed.                           - NPO                           - IV fluid resuscitation                           - Recommend starting IV PPI upon arrival to ER.                           - Start empriric antibiotics, no need for                            octreotide given no varices.                           - Obtain CBC, INR, consider type and cross                           - Recommend repeat EGD tomorrow to completely                            assess upper GI tract and ensure  hemostasis                           - Can also consider completing planned colonoscopy,                            but defer to inpatient team Tashala Cumbo E. Candis Schatz, MD 06/29/2021 3:11:56 PM This report has been signed electronically.

## 2021-06-29 NOTE — Progress Notes (Signed)
VS taken by DT 

## 2021-06-29 NOTE — H&P (Signed)
Referring Physician: Dr. Cheral Marker  FLORENDA WATT is an 60 y.o. female.                       Chief Complaint: Acute upper GI bleed  HPI: 60 years old black female with PMH of asthma, atrial fibrillation, HTN, alcoholic liver disease with portal hypertension and esophageal varices had upper endoscopy today by her GI doctor. He found upper GI bleed from esophageal varices. Patient had also noticed rectal bleed. She is denying abdominal pain. She is on IV Protonix and IV fluids per GI recommendation.  Past Medical History:  Diagnosis Date   Arthritis    hip  and knees    Asthma    hx of years ago    Atrial fibrillation ([place]) 11/2016   Blood transfusion    hx of years ago    Cholecystitis    Dysrhythmia    a fib   Hypertension       Past Surgical History:  Procedure Laterality Date   ANKLE SURGERY     BACK SURGERY     SPINAL FUSION   CESAREAN SECTION     cesearan     x4   CHOLECYSTECTOMY N/A 12/11/2013   Procedure: LAPAROSCOPIC CHOLECYSTECTOMY WITH INTRAOPERATIVE CHOLANGIOGRAM;  Surgeon: Imogene Burn. Georgette Dover, MD;  Location: [place];  Service: General;  Laterality: N/A;   LEFT HEART CATH AND CORONARY ANGIOGRAPHY N/A 11/29/2016   Procedure: Left Heart Cath and Coronary Angiography;  Surgeon: [name], MD;  Location: [facility];  Service: Cardiovascular;  Laterality: N/A;   TOTAL HIP ARTHROPLASTY  10/22/2011   Procedure: TOTAL HIP ARTHROPLASTY;  Surgeon: Gearlean Alf, MD;  [facility];  Service: Orthopedics;  Laterality: Right;   TOTAL HIP ARTHROPLASTY Left 12/05/2016   Procedure: LEFT TOTAL HIP ARTHROPLASTY ANTERIOR APPROACH;  Surgeon: Gaynelle Arabian, MD;  [facility];  Service: Orthopedics;  Laterality: Left;    Family History  Problem Relation Age of Onset   Diabetes Mother    Hypertension Father    Colon cancer Neg Hx    Esophageal cancer Neg Hx    Rectal cancer Neg Hx    Stomach cancer Neg Hx    Social History:  reports that she has been  smoking cigarettes. She has a 5.00 pack-year smoking history. She has never used smokeless tobacco. She reports current alcohol use of about 5.0 standard drinks per week. She reports that she does not use drugs.  Allergies:  Allergies  Allergen Reactions   Bee Venom Swelling and Other (See Comments)    Swelling wherever the sting is. Also gets dizzy   Lisinopril Swelling    (Not in a hospital admission)   Results for orders placed or performed during the hospital encounter of 06/29/21 (from the past 48 hour(s))  Comprehensive metabolic panel     Status: Abnormal   Collection Time: 06/29/21  4:25 PM  Result Value Ref Range   Sodium 136 135 - 145 mmol/L   Potassium 4.0 3.5 - 5.1 mmol/L   Chloride 106 98 - 111 mmol/L   CO2 23 22 - 32 mmol/L   Glucose, Bld 90 70 - 99 mg/dL    Comment: Glucose reference range applies only to samples taken after fasting for at least 8 hours.   BUN <5 (L) 6 - 20 mg/dL   Creatinine, Ser 0.47 0.44 - 1.00 mg/dL   Calcium 9.1 8.9 - 10.3 mg/dL   Total Protein 7.0  6.5 - 8.1 g/dL   Albumin 2.6 (L) 3.5 - 5.0 g/dL   AST 52 (H) 15 - 41 U/L   ALT 16 0 - 44 U/L   Alkaline Phosphatase 87 38 - 126 U/L   Total Bilirubin 2.1 (H) 0.3 - 1.2 mg/dL   GFR, Estimated >60 >60 mL/min    Comment: (NOTE) Calculated using the CKD-EPI Creatinine Equation (2021)    Anion gap 7 5 - 15    Comment: Performed at [facility] [address], [place], [place] [address]  CBC WITH DIFFERENTIAL     Status: Abnormal   Collection Time: 06/29/21  4:25 PM  Result Value Ref Range   WBC 12.0 (H) 4.0 - 10.5 K/uL   RBC 4.46 3.87 - 5.11 MIL/uL   Hemoglobin 10.2 (L) 12.0 - 15.0 g/dL   HCT 31.8 (L) 36.0 - 46.0 %   MCV 71.3 (L) 80.0 - 100.0 fL   MCH 22.9 (L) 26.0 - 34.0 pg   MCHC 32.1 30.0 - 36.0 g/dL   RDW 16.1 (H) 11.5 - 15.5 %   Platelets 246 150 - 400 K/uL    Comment: REPEATED TO VERIFY   nRBC 0.0 0.0 - 0.2 %   Neutrophils Relative % 58 %   Neutro Abs 7.0 1.7 - 7.7 K/uL    Lymphocytes Relative 29 %   Lymphs Abs 3.5 0.7 - 4.0 K/uL   Monocytes Relative 10 %   Monocytes Absolute 1.2 (H) 0.1 - 1.0 K/uL   Eosinophils Relative 2 %   Eosinophils Absolute 0.2 0.0 - 0.5 K/uL   Basophils Relative 1 %   Basophils Absolute 0.1 0.0 - 0.1 K/uL   Immature Granulocytes 0 %   Abs Immature Granulocytes 0.05 0.00 - 0.07 K/uL    Comment: Performed at [facility], [place] [address] [place], [place] [address]  Protime-INR     Status: Abnormal   Collection Time: 06/29/21  4:25 PM  Result Value Ref Range   Prothrombin Time 16.7 (H) 11.4 - 15.2 seconds   INR 1.4 (H) 0.8 - 1.2    Comment: (NOTE) INR goal varies based on device and disease states. Performed at [facility], [place] [address], [place], [place] [address]   APTT     Status: Abnormal   Collection Time: 06/29/21  4:25 PM  Result Value Ref Range   aPTT 51 (H) 24 - 36 seconds    Comment:        IF BASELINE aPTT IS ELEVATED, SUGGEST PATIENT RISK ASSESSMENT BE USED TO DETERMINE APPROPRIATE ANTICOAGULANT THERAPY. Performed at [facility], [place] [address], [place], [place] [address]   Resp Panel by RT-PCR (Flu A&B, Covid) Nasopharyngeal Swab     Status: None   Collection Time: 06/29/21  4:27 PM   Specimen: Nasopharyngeal Swab; Nasopharyngeal(NP) swabs in vial transport medium  Result Value Ref Range   SARS Coronavirus 2 by RT PCR NEGATIVE NEGATIVE    Comment: (NOTE) SARS-CoV-2 target nucleic acids are NOT DETECTED.  The SARS-CoV-2 RNA is generally detectable in upper respiratory specimens during the acute phase of infection. The lowest concentration of SARS-CoV-2 viral copies this assay can detect is 138 copies/mL. A negative result does not preclude SARS-Cov-2 infection and should not be used as the sole basis for treatment or other patient management decisions. A negative result may occur with  improper specimen collection/handling, submission of specimen other than nasopharyngeal swab,  presence of viral mutation(s) within the  areas targeted by this assay, and inadequate number of viral copies(<138 copies/mL). A negative result must be combined with clinical observations, patient history, and epidemiological information. The expected result is Negative.  Fact Sheet for Patients:  EntrepreneurPulse.com.au  Fact Sheet for Healthcare Providers:  IncredibleEmployment.be  This test is no t yet approved or cleared by the [place] FDA and  has been authorized for detection and/or diagnosis of SARS-CoV-2 by FDA under an Emergency Use Authorization (EUA). This EUA will remain  in effect (meaning this test can be used) for the duration of the COVID-19 declaration under Section 564(b)(1) of the Act, 21 U.S.C.section 360bbb-3(b)(1), unless the authorization is terminated  or revoked sooner.       Influenza A by PCR NEGATIVE NEGATIVE   Influenza B by PCR NEGATIVE NEGATIVE    Comment: (NOTE) The Xpert Xpress SARS-CoV-2/FLU/RSV plus assay is intended as an aid in the diagnosis of influenza from Nasopharyngeal swab specimens and should not be used as a sole basis for treatment. Nasal washings and aspirates are unacceptable for Xpert Xpress SARS-CoV-2/FLU/RSV testing.  Fact Sheet for Patients: EntrepreneurPulse.com.au  Fact Sheet for Healthcare Providers: IncredibleEmployment.be  This test is not yet approved or cleared by the [place] FDA and has been authorized for detection and/or diagnosis of SARS-CoV-2 by FDA under an Emergency Use Authorization (EUA). This EUA will remain in effect (meaning this test can be used) for the duration of the COVID-19 declaration under Section 564(b)(1) of the Act, 21 U.S.C. section 360bbb-3(b)(1), unless the authorization is terminated or revoked.  Performed at [facility], [place] [address], [place], [place] [address]   Type and screen [place]     Status: None   Collection Time: 06/29/21  5:14 PM  Result Value Ref Range   ABO/RH(D) O POS    Antibody Screen NEG    Sample Expiration      07/02/2021,2359 Performed at [facility], [place] [address] [place], [place] [address]    No results found.  Review Of Systems Constitutional: No fever, chills, weight loss or gain. Eyes: No vision change, wears glasses. No discharge or pain. Ears: No hearing loss, No tinnitus. Respiratory: Positive asthma, COPD, pneumonias, shortness of breath. No hemoptysis. Cardiovascular: Positive chest pain, palpitation, leg edema. Gastrointestinal: No nausea, vomiting, diarrhea, constipation. Positive GI bleed. No hepatitis. Genitourinary: No dysuria, hematuria, kidney stone. No incontinance. Neurological: No headache, stroke, seizures.  Psychiatry: No psych facility admission for anxiety, depression, suicide. No detox. Skin: No rash. Musculoskeletal: Positive joint pain, fibromyalgia, neck pain, back pain. Lymphadenopathy: No lymphadenopathy. Hematology: No anemia or easy bruising.   Blood pressure 108/70, pulse 90, temperature 98 F (36.7 C), temperature source Oral, resp. rate 17, height 4\' 10"  (1.473 m), weight 73 kg, SpO2 95 %. Body mass index is 33.65 kg/m. General appearance: alert, cooperative, appears stated age and no distress Head: Normocephalic, atraumatic. Eyes: Brown eyes, pink conjunctiva, corneas clear.  Neck: No adenopathy, no carotid bruit, no JVD, supple, symmetrical, trachea midline and thyroid not enlarged. Resp: Clear to auscultation bilaterally. Cardio: Regular rate and rhythm, S1, S2 normal, II/VI systolic murmur, no click, rub or gallop GI: Soft, non-tender; bowel sounds normal; no organomegaly. Extremities: No edema, cyanosis or clubbing. Skin: Warm and dry.  Neurologic: Alert and oriented X 3, normal strength. .  Assessment/Plan Acute upper GI bleed Esophageal varices Alcoholic liver disease  with portal hypertension HTN Tobacco use disorder Alcohol use disorder  Plan: CBC, CMET. IV  fluids. IV pantoprazole per GI.  Review home medications. GI consult.  Time spent: Review of old records, Lab, x-rays, EKG, other cardiac tests, examination, discussion with patient/Doctor/Family over 70 minutes.  Birdie Riddle, MD  06/29/2021, 7:20 PM

## 2021-06-29 NOTE — Progress Notes (Signed)
[facility] History and Physical   Primary Care Physician:  [name], MD   Reason for Procedure:   Cirrhosis, variceal screening; Colon cancer screening  Plan:    Variceal screening upper endoscopy, screening colonoscopy     HPI: Marissa Weber is a 60 y.o. female with recently diagnosed alcoholic cirrhosis here for variceal screening and initial average risk screening colonoscopy.  She had a recent episode of severe pain with nausea and vomiting and CT scan showed evidence of enteritis.  These symptoms resolved and she denies any current GI symptoms, other than constipation.   Past Medical History:  Diagnosis Date   Arthritis    hip  and knees    Asthma    hx of years ago    Atrial fibrillation ([place]) 11/2016   Blood transfusion    hx of years ago    Cholecystitis    Dysrhythmia    a fib   Hypertension     Past Surgical History:  Procedure Laterality Date   ANKLE SURGERY     BACK SURGERY     SPINAL FUSION   CESAREAN SECTION     cesearan     x4   CHOLECYSTECTOMY N/A 12/11/2013   Procedure: LAPAROSCOPIC CHOLECYSTECTOMY WITH INTRAOPERATIVE CHOLANGIOGRAM;  Surgeon: Imogene Burn. Georgette Dover, MD;  Location: [facility];  Service: General;  Laterality: N/A;   LEFT HEART CATH AND CORONARY ANGIOGRAPHY N/A 11/29/2016   Procedure: Left Heart Cath and Coronary Angiography;  Surgeon: [name], MD;  Location: [facility];  Service: Cardiovascular;  Laterality: N/A;   TOTAL HIP ARTHROPLASTY  10/22/2011   Procedure: TOTAL HIP ARTHROPLASTY;  Surgeon: Gearlean Alf, MD;  [facility];  Service: Orthopedics;  Laterality: Right;   TOTAL HIP ARTHROPLASTY Left 12/05/2016   Procedure: LEFT TOTAL HIP ARTHROPLASTY ANTERIOR APPROACH;  Surgeon: Gaynelle Arabian, MD;  [facility];  Service: Orthopedics;  Laterality: Left;    Prior to Admission medications   Medication Sig Start Date End Date Taking? Authorizing Provider  aspirin EC 81 MG tablet Take 162 mg by mouth daily.    Yes [provider]  benzonatate (TESSALON) 100 MG capsule Take 1 capsule (100 mg total) by mouth 3 (three) times daily as needed for cough. 12/05/20  Yes Quintella Reichert, MD  metoprolol succinate (TOPROL-XL) 50 MG 24 hr tablet Take 50 mg by mouth every morning. 04/30/21  Yes [provider]  tiZANidine (ZANAFLEX) 2 MG tablet Take 2 mg by mouth at bedtime as needed for muscle spasms.   Yes [provider]  Vitamin D, Ergocalciferol, (DRISDOL) 1.25 MG (50000 UNIT) CAPS capsule Take 50,000 Units by mouth once a week. 04/03/21  Yes [provider]  albuterol (PROVENTIL HFA;VENTOLIN HFA) 108 (90 Base) MCG/ACT inhaler Inhale 2 puffs into the lungs every 4 (four) hours as needed for wheezing or shortness of breath.    [provider]  doxycycline (VIBRAMYCIN) 100 MG capsule Take 1 capsule (100 mg total) by mouth 2 (two) times daily. 12/05/20   Quintella Reichert, MD  hydrochlorothiazide (HYDRODIURIL) 25 MG tablet Take by mouth. Patient not taking: Reported on 06/29/2021    [provider]  ondansetron (ZOFRAN ODT) 4 MG disintegrating tablet Take 1 tablet (4 mg total) by mouth every 8 (eight) hours as needed for nausea or vomiting. 05/05/21   Rayna Sexton, PA-C  oxyCODONE (ROXICODONE) 5 MG immediate release tablet Take 1 tablet (5 mg total) by mouth every 6 (six) hours as needed for severe  pain. 05/05/21   Rayna Sexton, PA-C  metoprolol tartrate (LOPRESSOR) 25 MG tablet Take 1 tablet (25 mg total) by mouth 2 (two) times daily. Patient not taking: Reported on 12/05/2020 11/30/16 12/06/20  [name], MD    Current Outpatient Medications  Medication Sig Dispense Refill   aspirin EC 81 MG tablet Take 162 mg by mouth daily.     benzonatate (TESSALON) 100 MG capsule Take 1 capsule (100 mg total) by mouth 3 (three) times daily as needed for cough. 15 capsule 0   metoprolol succinate (TOPROL-XL) 50 MG 24 hr tablet Take 50 mg by mouth every morning.     tiZANidine  (ZANAFLEX) 2 MG tablet Take 2 mg by mouth at bedtime as needed for muscle spasms.     Vitamin D, Ergocalciferol, (DRISDOL) 1.25 MG (50000 UNIT) CAPS capsule Take 50,000 Units by mouth once a week.     albuterol (PROVENTIL HFA;VENTOLIN HFA) 108 (90 Base) MCG/ACT inhaler Inhale 2 puffs into the lungs every 4 (four) hours as needed for wheezing or shortness of breath.     doxycycline (VIBRAMYCIN) 100 MG capsule Take 1 capsule (100 mg total) by mouth 2 (two) times daily. 14 capsule 0   hydrochlorothiazide (HYDRODIURIL) 25 MG tablet Take by mouth. (Patient not taking: Reported on 06/29/2021)     ondansetron (ZOFRAN ODT) 4 MG disintegrating tablet Take 1 tablet (4 mg total) by mouth every 8 (eight) hours as needed for nausea or vomiting. 8 tablet 0   oxyCODONE (ROXICODONE) 5 MG immediate release tablet Take 1 tablet (5 mg total) by mouth every 6 (six) hours as needed for severe pain. 8 tablet 0   Current Facility-Administered Medications  Medication Dose Route Frequency Provider Last Rate Last Admin   0.9 %  sodium chloride infusion  500 mL Intravenous Once [name], MD        Allergies as of 06/29/2021 - Review Complete 06/29/2021  Allergen Reaction Noted   Bee venom Swelling and Other (See Comments) 04/16/2016   Lisinopril Swelling 06/13/2016    Family History  Problem Relation Age of Onset   Diabetes Mother    Hypertension Father    Colon cancer Neg Hx    Esophageal cancer Neg Hx    Rectal cancer Neg Hx    Stomach cancer Neg Hx     Social History   Socioeconomic History   Marital status: Single    Spouse name: Not on file   Number of children: 5   Years of education: Not on file   Highest education level: Not on file  Occupational History   Not on file  Tobacco Use   Smoking status: Every Day    Packs/day: 0.50    Years: 10.00    Pack years: 5.00    Types: Cigarettes   Smokeless tobacco: Never  Vaping Use   Vaping Use: Never used  Substance and Sexual Activity    Alcohol use: Yes    Alcohol/week: 5.0 standard drinks    Types: 1 Glasses of wine, 4 Cans of beer per week    Comment: ocassionally   Drug use: No   Sexual activity: Yes  Other Topics Concern   Not on file  Social History Narrative   Not on file   Social Determinants of Health   Financial Resource Strain: Not on file  Food Insecurity: Not on file  Transportation Needs: Not on file  Physical Activity: Not on file  Stress: Not on file  Social Connections: Not  on file  Intimate Partner Violence: Not on file    Review of Systems:  All other review of systems negative except as mentioned in the HPI.  Physical Exam: Vital signs BP 118/71   Pulse (!) 111   Temp (!) 97.3 F (36.3 C)   Ht 4' 10.5" (1.486 m)   Wt 161 lb (73 kg)   SpO2 100%   BMI 33.08 kg/m   General:   Alert,  Well-developed, well-nourished, pleasant and cooperative in NAD Lungs:  Clear throughout to auscultation.   Heart:  Regular rate and rhythm; no murmurs, clicks, rubs,  or gallops. Abdomen:  Soft, nontender and nondistended. Normal bowel sounds.  Ventral hernia Neuro/Psych:  Normal mood and affect. A and O x 3   Khamya Topp E. Candis Schatz, MD [facility]

## 2021-06-29 NOTE — Progress Notes (Signed)
To PACU< VSS. Report to [email or web address] 

## 2021-06-29 NOTE — Progress Notes (Signed)
Pt found to have actively bleeding esophageal varices during EGD. Called EMS for transfer to hospital per MD order. VSS in PACU. Family at bedside and updated by MD and EMS. Patient transferred to [facility] via EMS.

## 2021-06-30 ENCOUNTER — Inpatient Hospital Stay (HOSPITAL_COMMUNITY): Payer: Medicaid Other

## 2021-06-30 ENCOUNTER — Telehealth: Payer: Self-pay

## 2021-06-30 ENCOUNTER — Inpatient Hospital Stay (HOSPITAL_COMMUNITY): Payer: Medicaid Other | Admitting: Anesthesiology

## 2021-06-30 ENCOUNTER — Encounter (HOSPITAL_COMMUNITY): Payer: Self-pay | Admitting: Cardiovascular Disease

## 2021-06-30 ENCOUNTER — Encounter (HOSPITAL_COMMUNITY)
Admission: EM | Disposition: A | Discharge status: Home / Self Care | Payer: Self-pay | Attending: Cardiovascular Disease

## 2021-06-30 DIAGNOSIS — K259 Gastric ulcer, unspecified as acute or chronic, without hemorrhage or perforation: Secondary | ICD-10-CM

## 2021-06-30 DIAGNOSIS — K922 Gastrointestinal hemorrhage, unspecified: Secondary | ICD-10-CM

## 2021-06-30 DIAGNOSIS — K317 Polyp of stomach and duodenum: Secondary | ICD-10-CM

## 2021-06-30 HISTORY — PX: ESOPHAGOGASTRODUODENOSCOPY (EGD) WITH PROPOFOL: SHX5813

## 2021-06-30 HISTORY — PX: BIOPSY: SHX5522

## 2021-06-30 LAB — COMPREHENSIVE METABOLIC PANEL
ALT: 15 U/L (ref 0–44)
AST: 42 U/L — ABNORMAL HIGH (ref 15–41)
Albumin: 2.2 g/dL — ABNORMAL LOW (ref 3.5–5.0)
Alkaline Phosphatase: 65 U/L (ref 38–126)
Anion gap: 6 (ref 5–15)
BUN: <5 mg/dL — ABNORMAL LOW (ref 6–20)
CO2: 23 mmol/L (ref 22–32)
Calcium: 8.6 mg/dL — ABNORMAL LOW (ref 8.9–10.3)
Chloride: 108 mmol/L (ref 98–111)
Creatinine, Ser: 0.55 mg/dL (ref 0.44–1.00)
GFR, Estimated: >60 mL/min (ref >60)
Glucose, Bld: 128 mg/dL — ABNORMAL HIGH (ref 70–99)
Potassium: 3.2 mmol/L — ABNORMAL LOW (ref 3.5–5.1)
Sodium: 137 mmol/L (ref 135–145)
Total Bilirubin: 1.4 mg/dL — ABNORMAL HIGH (ref 0.3–1.2)
Total Protein: 5.6 g/dL — ABNORMAL LOW (ref 6.5–8.1)

## 2021-06-30 LAB — CBC
HCT: 24.7 % — ABNORMAL LOW (ref 36.0–46.0)
Hemoglobin: 7.9 g/dL — ABNORMAL LOW (ref 12.0–15.0)
MCH: 23.2 pg — ABNORMAL LOW (ref 26.0–34.0)
MCHC: 32 g/dL (ref 30.0–36.0)
MCV: 72.4 fL — ABNORMAL LOW (ref 80.0–100.0)
Platelets: 201 10*3/uL (ref 150–400)
RBC: 3.41 MIL/uL — ABNORMAL LOW (ref 3.87–5.11)
RDW: 16.3 % — ABNORMAL HIGH (ref 11.5–15.5)
WBC: 9.8 10*3/uL (ref 4.0–10.5)
nRBC: 0 % (ref 0.0–0.2)

## 2021-06-30 LAB — HEMOGLOBIN AND HEMATOCRIT, BLOOD
HCT: 24.2 % — ABNORMAL LOW (ref 36.0–46.0)
HCT: 24.5 % — ABNORMAL LOW (ref 36.0–46.0)
Hemoglobin: 8 g/dL — ABNORMAL LOW (ref 12.0–15.0)
Hemoglobin: 8 g/dL — ABNORMAL LOW (ref 12.0–15.0)

## 2021-06-30 LAB — PROTIME-INR
INR: 1.5 — ABNORMAL HIGH (ref 0.8–1.2)
Prothrombin Time: 17.7 seconds — ABNORMAL HIGH (ref 11.4–15.2)

## 2021-06-30 LAB — ECHOCARDIOGRAM COMPLETE
Area-P 1/2: 4.68 cm2
Calc EF: 65.4 %
Height: 58 in
S' Lateral: 2.2 cm
Single Plane A2C EF: 64 %
Single Plane A4C EF: 67.2 %
Weight: 2627.88 oz

## 2021-06-30 LAB — HIV ANTIBODY (ROUTINE TESTING W REFLEX): HIV Screen 4th Generation wRfx: NONREACTIVE

## 2021-06-30 LAB — VITAMIN B12: Vitamin B-12: 541 pg/mL (ref 180–914)

## 2021-06-30 LAB — APTT: aPTT: 50 seconds — ABNORMAL HIGH (ref 24–36)

## 2021-06-30 SURGERY — ESOPHAGOGASTRODUODENOSCOPY (EGD) WITH PROPOFOL
Anesthesia: Monitor Anesthesia Care

## 2021-06-30 MED ORDER — OXYCODONE HCL 5 MG PO TABS
5.0000 mg | ORAL_TABLET | Freq: Four times a day (QID) | ORAL | Status: DC | PRN
  Administered 2021-06-30 – 2021-07-03 (×10): 5 mg via ORAL
  Filled 2021-06-30 (×11): qty 1

## 2021-06-30 MED ORDER — SODIUM CHLORIDE 0.9 % IV SOLN
1.0000 g | INTRAVENOUS | Status: DC
  Administered 2021-06-30 – 2021-07-03 (×4): 1 g via INTRAVENOUS
  Filled 2021-06-30 (×4): qty 10

## 2021-06-30 MED ORDER — PROPOFOL 500 MG/50ML IV EMUL
INTRAVENOUS | Status: DC | PRN
  Administered 2021-06-30: 100 ug/kg/min via INTRAVENOUS

## 2021-06-30 MED ORDER — LIDOCAINE 2% (20 MG/ML) 5 ML SYRINGE
INTRAMUSCULAR | Status: DC | PRN
  Administered 2021-06-30: 40 mg via INTRAVENOUS

## 2021-06-30 SURGICAL SUPPLY — 15 items

## 2021-06-30 NOTE — Anesthesia Postprocedure Evaluation (Signed)
Anesthesia Post Note  Patient: Marissa Weber  Procedure(s) Performed: ESOPHAGOGASTRODUODENOSCOPY (EGD) WITH PROPOFOL BIOPSY     Patient location during evaluation: PACU Anesthesia Type: MAC Level of consciousness: awake and alert Pain management: pain level controlled Vital Signs Assessment: post-procedure vital signs reviewed and stable Respiratory status: spontaneous breathing, nonlabored ventilation, respiratory function stable and patient connected to nasal cannula oxygen Cardiovascular status: stable and blood pressure returned to baseline Postop Assessment: no apparent nausea or vomiting Anesthetic complications: no   No notable events documented.  Last Vitals:  Vitals:   06/30/21 1530 06/30/21 1540  BP: 97/67 103/65  Pulse: 84 81  Resp: (!) 22 16  Temp:    SpO2: 96% 97%    Last Pain:  Vitals:   06/30/21 1540  TempSrc:   PainSc: 0-No pain                 [name] Dewaine Morocho

## 2021-06-30 NOTE — Telephone Encounter (Signed)
08/04/21 at 230 pm appt made with Dr Ardis Hughs.  Dr Lorenso Courier advised and pt will be given appt information at discharge.

## 2021-06-30 NOTE — Telephone Encounter (Signed)
-----   Message from Sharyn Creamer, MD sent at 06/30/2021  3:33 PM EDT ----- Hi, please arrange for GI clinic follow up in 1 month with Dr. Ardis Hughs or APP for follow up for gastric polyps.  Thanks, Lyndee Leo

## 2021-06-30 NOTE — Op Note (Signed)
[facility] Patient Name: Marissa Weber Procedure Date : 06/30/2021 MRN: [id number] Attending MD: Georgian Co ,  Date of Birth: 1960-09-24 CSN: [id number] Age: 60 Admit Type: Inpatient Procedure:                Upper GI endoscopy Indications:              Suspected upper gastrointestinal bleeding Providers:                Marissa Weber" Marissa Hacking, RN, Marissa Cara, RN, Marissa Weber Technician,                            Technician Referring MD:             Hospitalist team Medicines:                Monitored Anesthesia Care Complications:            No immediate complications. Estimated Blood Loss:     Estimated blood loss was minimal. Procedure:                Pre-Anesthesia Assessment:                           - Prior to the procedure, a History and Physical                            was performed, and patient medications and                            allergies were reviewed. The patient's tolerance of                            previous anesthesia was also reviewed. The risks                            and benefits of the procedure and the sedation                            options and risks were discussed with the patient.                            All questions were answered, and informed consent                            was obtained. Prior Anticoagulants: The patient has                            taken no previous anticoagulant or antiplatelet                            agents. ASA Grade Assessment: III - A patient with  severe systemic disease. After reviewing the risks                            and benefits, the patient was deemed in                            satisfactory condition to undergo the procedure.                           After obtaining informed consent, the endoscope was                            passed under direct vision. Throughout the                             procedure, the patient's blood pressure, pulse, and                            oxygen saturations were monitored continuously. The                            [id number]-[id number] ([id number]) Olympus endoscope was introduced                            through the mouth, and advanced to the third part                            of duodenum. The upper GI endoscopy was                            accomplished without difficulty. The patient                            tolerated the procedure well. Scope In: Scope Out: Findings:      The examined esophagus was normal.      A hiatal hernia was present.      One non-bleeding linear gastric ulcer was found in the cardia, suspected       to be either a Mallory-Weiss tear or Marissa Weber ulcer since it was located       within the hernia.      An endoclip was found in the cardia.      A single 3 mm sessile polyp with no bleeding was found in the second       portion of the duodenum. Biopsies were taken with a cold forceps for       histology. Impression:               - Normal esophagus.                           - Hiatal hernia.                           - Non-bleeding gastric ulcer within the hernia.                           -  An endoclip was found in the stomach.                           - A single duodenal polyp. Biopsied. Recommendation:           - Return patient to hospital ward for ongoing care.                           - It is suspected that the blood in the stomach                            seen on prior EGD was likely due to an ulcer found                            within the hernia (Mallory-Weiss tear vs Marissa Weber                            lesion) versus spot that was clipped by Dr.                            Candis Weber. No active bleeding was seen. The blood                            from the stomach had cleared.                           - Use a proton pump inhibitor PO BID for 8 weeks.                           - Await pathology  results.                           - Check serum H pylori. Treat if positive.                           - The findings and recommendations were discussed                            with the patient. Procedure Code(s):        --- Professional ---                           [id number], Esophagogastroduodenoscopy, flexible,                            transoral; with biopsy, single or multiple Diagnosis Code(s):        --- Professional ---                           K44.9, Diaphragmatic hernia without obstruction or                            gangrene  K25.9, Gastric ulcer, unspecified as acute or                            chronic, without hemorrhage or perforation                           T18.2XXA, Foreign body in stomach, initial encounter                           K31.7, Polyp of stomach and duodenum CPT copyright 2019 [employer]. All rights reserved. The codes documented in this report are preliminary and upon coder review may  be revised to meet current compliance requirements. Marissa Weber "Marissa Weber,  06/30/2021 3:32:46 PM Number of Addenda: 0

## 2021-06-30 NOTE — Progress Notes (Signed)
  Echocardiogram 2D Echocardiogram has been performed.  Marissa Weber 06/30/2021, 10:39 AM

## 2021-06-30 NOTE — Progress Notes (Signed)
Patient experiencing RLQ abdominal pain 10/10 on pain scale. Pain medication recently discontinued. MD paged.  Daymon Larsen, RN

## 2021-06-30 NOTE — H&P (View-Only) (Signed)
Daily Rounding Note  06/30/2021, 9:37 AM  LOS: 1 day   SUBJECTIVE:   Chief complaint: Active GI bleeding on EGD yesterday.  Cirrhosis due to alcohol.     Soft blood pressures currently in the low 100s/60s.  Heart rate upper 90s.  Excellent room air sats in high 90s. Pt reports she saw blood yesterday morning after completing the second stage of bowel prep, this was before she went to the endoscopy center.  Continues to be passing blood, burgundy colored but this is "slacking off".  Some upper abdominal/epigastric discomfort since the procedure yesterday.  Overall feels okay.  No nausea now or in recent days.  Abstinent of all alcohol for 4 months. Hungry.  NPO since yesterday mid day.  .    Current Outpatient Medications  Medication Instructions   albuterol (PROVENTIL HFA;VENTOLIN HFA) 108 (90 Base) MCG/ACT inhaler 2 puffs, Inhalation, Every 4 hours PRN   aspirin EC 162 mg, Oral, Daily   metoprolol succinate (TOPROL-XL) 50 mg, Oral, Every morning   tiZANidine (ZANAFLEX) 2 mg, Oral, At bedtime PRN   Vitamin D (Ergocalciferol) (DRISDOL) 50,000 Units, Oral, Weekly    Scheduled Meds: Continuous Infusions:  sodium chloride 100 mL/hr at 06/29/21 2309   cefTRIAXone (ROCEPHIN)  IV     lactated ringers 125 mL/hr at 06/29/21 1639   pantoprazole 8 mg/hr (06/29/21 1744)   PRN Meds:.albuterol, tiZANidine   OBJECTIVE:         Vital signs in last 24 hours:    Temp:  [97.3 F (36.3 C)-98.3 F (36.8 C)] 98.1 F (36.7 C) (11/04 0736) Pulse Rate:  [86-111] 100 (11/04 0736) Resp:  [0-28] 16 (11/04 0736) BP: (91-137)/(63-117) 101/69 (11/04 0736) SpO2:  [81 %-100 %] 98 % (11/04 0736) Weight:  [73 kg-74.5 kg] 74.5 kg (11/03 2145) Last BM Date: 06/29/21 Filed Weights   06/29/21 1604 06/29/21 2145  Weight: 73 kg 74.5 kg   General: Patient a little bit sleepy but arousable and maintains arousal easily.  Comfortable.  Does not look  acutely ill. Heart: RRR. Chest: Cough with deep inspiration.  No rales, no rhonchi. Abdomen: Soft.  Not tender.  Mid abdominal hernia.  Active bowel sounds. Extremities: No CCE.  Feet are warm. Neuro/Psych: Alert.  Oriented x3.  No tremor, no asterixis.  Intake/Output from previous day: 11/03 0701 - 11/04 0700 In: 181.8 [IV Piggyback:181.8] Out: 200   Intake/Output this shift: No intake/output data recorded.  Lab Results: Recent Labs    06/29/21 1625 06/30/21 0143  WBC 12.0* 9.8  HGB 10.2* 7.9*  HCT 31.8* 24.7*  PLT 246 201   BMET Recent Labs    06/29/21 1625 06/30/21 0143  NA 136 137  K 4.0 3.2*  CL 106 108  CO2 23 23  GLUCOSE 90 128*  BUN <5* <5*  CREATININE 0.47 0.55  CALCIUM 9.1 8.6*   LFT Recent Labs    06/29/21 1625 06/30/21 0143  PROT 7.0 5.6*  ALBUMIN 2.6* 2.2*  AST 52* 42*  ALT 16 15  ALKPHOS 87 65  BILITOT 2.1* 1.4*   PT/INR Recent Labs    06/29/21 1625 06/30/21 0143  LABPROT 16.7* 17.7*  INR 1.4* 1.5*   Hepatitis Panel No results for input(s): HEPBSAG, HCVAB, HEPAIGM, HEPBIGM in the last 72 hours.  Studies/Results: Portable chest 1 View  Result Date: 06/29/2021 CLINICAL DATA:  Esophageal varices EXAM: PORTABLE CHEST 1 VIEW COMPARISON:  12/05/2020 FINDINGS: The heart size and mediastinal contours  are within normal limits. Both lungs are clear. The visualized skeletal structures are unremarkable. IMPRESSION: No active disease. Electronically Signed   By: Fidela Salisbury M.D.   On: 06/29/2021 19:46    Scheduled Meds: Continuous Infusions:  sodium chloride 100 mL/hr at 06/29/21 2309   lactated ringers 125 mL/hr at 06/29/21 1639   pantoprazole 8 mg/hr (06/29/21 1744)   PRN Meds:.albuterol, tiZANidine   ASSESMENT:   Acute bleeding at time of yesterday's EGD 06/29/2021.  Study performed for variceal screening in a cirrhotic.  Study revealed large volume fresh and clotted blood in the gastric fundus and body.  Actively bleeding spot in  gastric cardia was treated with single Hemoclip, bleeding resolved.  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  Protonix drip in place.  Received Rocephin once.  Planned screening colonoscopy never pursued due to findings at EGD.  ABL anemia.  Background macrocytosis, mild anemia. Hgb 7 weeks ago 11.1 ... 10.2 yesterday afternoon ...  7.9 today.  Iron/anemia studies in mid September with normal iron, low TIBC, elevated iron sats, normal ferritin, low transferrin.  B12, folate not assayed.  History thrombocytopenia.  Platelets 107K in 11/2020.  117 in mid September.  Currently 201.  Coagulopathy.  INR 1.5.  Hypokalemia at 3.2.  Renal function WNL.  Cirrhosis of the liver.  Minor elevation T bili, AST, ALT improved compared with labs of April and September of this year.  05/06/2019 ER visit for nausea, vomiting, abdominal pain, anorexia with 05/05/2021 CTAP w contrast with new wall thickening/mucosal enhancement at multiple small bowel loops C/W enteritis.  Heterogeneous liver with severe steatosis and subtle nodularity suggesting cirrhosis.  Small volume ascites.  Aortic atherosclerosis.  Chronic alcohol abuse.  Initial GI consultation with Dr. Candis Schatz 9/15, pt reported resolution of nausea, vomiting, pain.  Reported consumption of 40 to 80 ounces beer several times weekly but mostly on weekends, daughter reports she drinks more than that chronically.  Patient counseled re: complete cessation of alcohol.  05/24/2021 hepatic elastography/ultrasound showed nodular liver, perihepatic ascites, portal vein circulation patent/normal, median kPa 48.5 (>/+ 17 kPa highly s/O clinically significant portal hypertension).  Set up for screening colonoscopy and EGD 11/3.  Laboratories of 05/11/2021: ceruloplasmin normal, smooth muscle IgG slightly elevated at 28.  ANA negative.  Mitochondrial antibodies negative.  Alpha 1 antitrypsin in normal range.  GGT elevated at 486.  INR  1.4.    Infectious enteritis seen in ED 05/05/21.  Symptoms resolved.  Findings on CT imaging noted above.  PAF 2018.  Stress test with possible ischemia but no coronary disease on subsequent catheter.  12/2019 echocardiogram with LVEF 55 to 60%.  Daily meds include 162 mg aspirin, no anticoagulation.  Status post cholecystectomy.     PLAN      Continue daily Rocephin, reordered.   Continue Protonix drip.  Timing for repeat EGD to be determined.  There has been a cancellation of procedure this afternoon and plan for EGD this afternoon.  Therefore continue NPO.    Folate and b12 levels.  Serial Hgb/hct.      Azucena Freed  06/30/2021, 9:37 AM Phone [phone]

## 2021-06-30 NOTE — Interval H&P Note (Signed)
History and Physical Interval Note:  06/30/2021 6:40 PM  Marissa Weber  has presented today for surgery, with the diagnosis of Evaluate bleeding lesion in stomach seen yesterday..  The various methods of treatment have been discussed with the patient and family. After consideration of risks, benefits and other options for treatment, the patient has consented to  Procedure(s): ESOPHAGOGASTRODUODENOSCOPY (EGD) WITH PROPOFOL (N/A) BIOPSY as a surgical intervention.  The patient's history has been reviewed, patient examined, no change in status, stable for surgery.  I have reviewed the patient's chart and labs.  Questions were answered to the patient's satisfaction.     Sharyn Creamer

## 2021-06-30 NOTE — Anesthesia Preprocedure Evaluation (Addendum)
Anesthesia Evaluation  Patient identified by MRN, date of birth, ID band Patient awake    Reviewed: Allergy & Precautions, NPO status , Patient's Chart, lab work & pertinent test results, reviewed documented beta blocker date and time   Airway Mallampati: I  TM Distance: >3 FB Neck ROM: Full    Dental  (+) Edentulous Upper, Edentulous Lower, Lower Dentures, Upper Dentures   Pulmonary asthma , Current Smoker and Patient abstained from smoking.,    Pulmonary exam normal breath sounds clear to auscultation       Cardiovascular hypertension, Pt. on medications and Pt. on home beta blockers Normal cardiovascular exam+ dysrhythmias Atrial Fibrillation  Rhythm:Regular Rate:Normal     Neuro/Psych PSYCHIATRIC DISORDERS Anxiety negative neurological ROS     GI/Hepatic negative GI ROS, (+) Cirrhosis   Esophageal Varices  substance abuse  alcohol use,   Endo/Other  negative endocrine ROS  Renal/GU negative Renal ROS  negative genitourinary   Musculoskeletal  (+) Arthritis ,   Abdominal   Peds  Hematology  (+) Blood dyscrasia, anemia , Lab Results      Component                Value               Date                      WBC                      9.8                 06/30/2021                HGB                      8.0 (L)             06/30/2021                HCT                      24.2 (L)            06/30/2021                MCV                      72.4 (L)            06/30/2021                PLT                      201                 06/30/2021              Anesthesia Other Findings   Reproductive/Obstetrics                           Anesthesia Physical Anesthesia Plan  ASA: 3  Anesthesia Plan: MAC   Post-op Pain Management:    Induction: Intravenous  PONV Risk Score and Plan: Propofol infusion and Treatment may vary due to age or medical condition  Airway Management Planned:  Natural Airway  Additional Equipment:   Intra-op Plan:   Post-operative Plan:   Informed Consent: I have reviewed  the patients History and Physical, chart, labs and discussed the procedure including the risks, benefits and alternatives for the proposed anesthesia with the patient or authorized representative who has indicated his/her understanding and acceptance.     Dental advisory given  Plan Discussed with: CRNA  Anesthesia Plan Comments:         Anesthesia Quick Evaluation

## 2021-06-30 NOTE — Progress Notes (Signed)
Ref: [name], MD   Subjective:  Awake. Underwent EGD this afternoon. No active bleed. Possible Mallory weiss tear per GI.  Objective:  Vital Signs in the last 24 hours: Temp:  [97.7 F (36.5 C)-98.3 F (36.8 C)] 97.7 F (36.5 C) (11/04 1520) Pulse Rate:  [81-100] 81 (11/04 1540) Cardiac Rhythm: Heart block (11/04 0806) Resp:  [16-22] 16 (11/04 1540) BP: (94-127)/(58-74) 103/65 (11/04 1540) SpO2:  [96 %-99 %] 97 % (11/04 1540) Weight:  [74.5 kg] 74.5 kg (11/04 1415)  Physical Exam: BP Readings from Last 1 Encounters:  06/30/21 103/65     Wt Readings from Last 1 Encounters:  06/30/21 74.5 kg    Weight change:  Body mass index is 32.62 kg/m. HEENT: [place]/AT, Eyes-Brown, Conjunctiva-Pale, Sclera-Non-icteric Neck: No JVD, No bruit, Trachea midline. Lungs:  Clear, Bilateral. Cardiac:  Regular rhythm, normal S1 and S2, no S3. II/VI systolic murmur. Abdomen:  Soft. Extremities:  No edema present. No cyanosis. No clubbing. CNS: AxOx3, Cranial nerves grossly intact, moves all 4 extremities.  Skin: Warm and dry.   Intake/Output from previous day: 11/03 0701 - 11/04 0700 In: 181.8 [IV Piggyback:181.8] Out: 200     Lab Results: BMET    Component Value Date/Time   NA 137 06/30/2021 0143   NA 136 06/29/2021 1625   NA 139 05/05/2021 1835   K 3.2 (L) 06/30/2021 0143   K 4.0 06/29/2021 1625   K 3.5 05/05/2021 1835   CL 108 06/30/2021 0143   CL 106 06/29/2021 1625   CL 100 05/05/2021 1835   CO2 23 06/30/2021 0143   CO2 23 06/29/2021 1625   CO2 25 05/05/2021 1835   GLUCOSE 128 (H) 06/30/2021 0143   GLUCOSE 90 06/29/2021 1625   GLUCOSE 108 (H) 05/05/2021 1835   BUN <5 (L) 06/30/2021 0143   BUN <5 (L) 06/29/2021 1625   BUN <5 (L) 05/05/2021 1835   CREATININE 0.55 06/30/2021 0143   CREATININE 0.47 06/29/2021 1625   CREATININE 0.55 05/05/2021 1835   CALCIUM 8.6 (L) 06/30/2021 0143   CALCIUM 9.1 06/29/2021 1625   CALCIUM 9.1 05/05/2021 1835   GFRNONAA >60 06/30/2021  0143   GFRNONAA >60 06/29/2021 1625   GFRNONAA >60 05/05/2021 1835   GFRAA >60 01/10/2020 0856   GFRAA >60 01/09/2020 0408   GFRAA >60 01/08/2020 0726   CBC    Component Value Date/Time   WBC 9.8 06/30/2021 0143   RBC 3.41 (L) 06/30/2021 0143   HGB 8.0 (L) 06/30/2021 1704   HCT 24.5 (L) 06/30/2021 1704   HCT 41.7 01/06/2020 1636   PLT 201 06/30/2021 0143   MCV 72.4 (L) 06/30/2021 0143   MCH 23.2 (L) 06/30/2021 0143   MCHC 32.0 06/30/2021 0143   RDW 16.3 (H) 06/30/2021 0143   LYMPHSABS 3.5 06/29/2021 1625   MONOABS 1.2 (H) 06/29/2021 1625   EOSABS 0.2 06/29/2021 1625   BASOSABS 0.1 06/29/2021 1625   HEPATIC Function Panel Recent Labs    05/11/21 1312 06/29/21 1625 06/30/21 0143  PROT 7.2 7.0 5.6*   HEMOGLOBIN A1C No components found for: HGA1C,  MPG CARDIAC ENZYMES Lab Results  Component Value Date   CKTOTAL 155 01/05/2020   BNP No results for input(s): PROBNP in the last 8760 hours. TSH No results for input(s): TSH in the last 8760 hours. CHOLESTEROL No results for input(s): CHOL in the last 8760 hours.  Scheduled Meds: Continuous Infusions:  sodium chloride 100 mL/hr at 06/29/21 2309   cefTRIAXone (ROCEPHIN)  IV 1  g (06/30/21 1057)   lactated ringers 125 mL/hr at 06/30/21 1453   pantoprazole Stopped (06/30/21 1454)   PRN Meds:.albuterol, oxyCODONE, tiZANidine  Assessment/Plan: Acute upper GI bleed Esophageal varices HTN Tobacco use disorder Alcohol use disorder  Plan: Monitor Hgb. Continue PPI. Continue IV fluids/Clear liquid diet.   LOS: 1 day   Time spent including chart review, lab review, examination, discussion with patient/Nurse : 30 min   [name]  MD  06/30/2021, 7:00 PM

## 2021-06-30 NOTE — Progress Notes (Addendum)
Daily Rounding Note  06/30/2021, 9:37 AM  LOS: 1 day   SUBJECTIVE:   Chief complaint: Active GI bleeding on EGD yesterday.  Cirrhosis due to alcohol.     Soft blood pressures currently in the low 100s/60s.  Heart rate upper 90s.  Excellent room air sats in high 90s. Pt reports she saw blood yesterday morning after completing the second stage of bowel prep, this was before she went to the endoscopy center.  Continues to be passing blood, burgundy colored but this is "slacking off".  Some upper abdominal/epigastric discomfort since the procedure yesterday.  Overall feels okay.  No nausea now or in recent days.  Abstinent of all alcohol for 4 months. Hungry.  NPO since yesterday mid day.  .    Current Outpatient Medications  Medication Instructions   albuterol (PROVENTIL HFA;VENTOLIN HFA) 108 (90 Base) MCG/ACT inhaler 2 puffs, Inhalation, Every 4 hours PRN   aspirin EC 162 mg, Oral, Daily   metoprolol succinate (TOPROL-XL) 50 mg, Oral, Every morning   tiZANidine (ZANAFLEX) 2 mg, Oral, At bedtime PRN   Vitamin D (Ergocalciferol) (DRISDOL) 50,000 Units, Oral, Weekly    Scheduled Meds: Continuous Infusions:  sodium chloride 100 mL/hr at 06/29/21 2309   cefTRIAXone (ROCEPHIN)  IV     lactated ringers 125 mL/hr at 06/29/21 1639   pantoprazole 8 mg/hr (06/29/21 1744)   PRN Meds:.albuterol, tiZANidine   OBJECTIVE:         Vital signs in last 24 hours:    Temp:  [97.3 F (36.3 C)-98.3 F (36.8 C)] 98.1 F (36.7 C) (11/04 0736) Pulse Rate:  [86-111] 100 (11/04 0736) Resp:  [0-28] 16 (11/04 0736) BP: (91-137)/(63-117) 101/69 (11/04 0736) SpO2:  [81 %-100 %] 98 % (11/04 0736) Weight:  [73 kg-74.5 kg] 74.5 kg (11/03 2145) Last BM Date: 06/29/21 Filed Weights   06/29/21 1604 06/29/21 2145  Weight: 73 kg 74.5 kg   General: Patient a little bit sleepy but arousable and maintains arousal easily.  Comfortable.  Does not look  acutely ill. Heart: RRR. Chest: Cough with deep inspiration.  No rales, no rhonchi. Abdomen: Soft.  Not tender.  Mid abdominal hernia.  Active bowel sounds. Extremities: No CCE.  Feet are warm. Neuro/Psych: Alert.  Oriented x3.  No tremor, no asterixis.  Intake/Output from previous day: 11/03 0701 - 11/04 0700 In: 181.8 [IV Piggyback:181.8] Out: 200   Intake/Output this shift: No intake/output data recorded.  Lab Results: Recent Labs    06/29/21 1625 06/30/21 0143  WBC 12.0* 9.8  HGB 10.2* 7.9*  HCT 31.8* 24.7*  PLT 246 201   BMET Recent Labs    06/29/21 1625 06/30/21 0143  NA 136 137  K 4.0 3.2*  CL 106 108  CO2 23 23  GLUCOSE 90 128*  BUN <5* <5*  CREATININE 0.47 0.55  CALCIUM 9.1 8.6*   LFT Recent Labs    06/29/21 1625 06/30/21 0143  PROT 7.0 5.6*  ALBUMIN 2.6* 2.2*  AST 52* 42*  ALT 16 15  ALKPHOS 87 65  BILITOT 2.1* 1.4*   PT/INR Recent Labs    06/29/21 1625 06/30/21 0143  LABPROT 16.7* 17.7*  INR 1.4* 1.5*   Hepatitis Panel No results for input(s): HEPBSAG, HCVAB, HEPAIGM, HEPBIGM in the last 72 hours.  Studies/Results: Portable chest 1 View  Result Date: 06/29/2021 CLINICAL DATA:  Esophageal varices EXAM: PORTABLE CHEST 1 VIEW COMPARISON:  12/05/2020 FINDINGS: The heart size and mediastinal contours  are within normal limits. Both lungs are clear. The visualized skeletal structures are unremarkable. IMPRESSION: No active disease. Electronically Signed   By: Fidela Salisbury M.D.   On: 06/29/2021 19:46    Scheduled Meds: Continuous Infusions:  sodium chloride 100 mL/hr at 06/29/21 2309   lactated ringers 125 mL/hr at 06/29/21 1639   pantoprazole 8 mg/hr (06/29/21 1744)   PRN Meds:.albuterol, tiZANidine   ASSESMENT:   Acute bleeding at time of yesterday's EGD 06/29/2021.  Study performed for variceal screening in a cirrhotic.  Study revealed large volume fresh and clotted blood in the gastric fundus and body.  Actively bleeding spot in  gastric cardia was treated with single Hemoclip, bleeding resolved.  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  Protonix drip in place.  Received Rocephin once.  Planned screening colonoscopy never pursued due to findings at EGD.  ABL anemia.  Background macrocytosis, mild anemia. Hgb 7 weeks ago 11.1 ... 10.2 yesterday afternoon ...  7.9 today.  Iron/anemia studies in mid September with normal iron, low TIBC, elevated iron sats, normal ferritin, low transferrin.  B12, folate not assayed.  History thrombocytopenia.  Platelets 107K in 11/2020.  117 in mid September.  Currently 201.  Coagulopathy.  INR 1.5.  Hypokalemia at 3.2.  Renal function WNL.  Cirrhosis of the liver.  Minor elevation T bili, AST, ALT improved compared with labs of April and September of this year.  05/06/2019 ER visit for nausea, vomiting, abdominal pain, anorexia with 05/05/2021 CTAP w contrast with new wall thickening/mucosal enhancement at multiple small bowel loops C/W enteritis.  Heterogeneous liver with severe steatosis and subtle nodularity suggesting cirrhosis.  Small volume ascites.  Aortic atherosclerosis.  Chronic alcohol abuse.  Initial GI consultation with Dr. Candis Schatz 9/15, pt reported resolution of nausea, vomiting, pain.  Reported consumption of 40 to 80 ounces beer several times weekly but mostly on weekends, daughter reports she drinks more than that chronically.  Patient counseled re: complete cessation of alcohol.  05/24/2021 hepatic elastography/ultrasound showed nodular liver, perihepatic ascites, portal vein circulation patent/normal, median kPa 48.5 (>/+ 17 kPa highly s/O clinically significant portal hypertension).  Set up for screening colonoscopy and EGD 11/3.  Laboratories of 05/11/2021: ceruloplasmin normal, smooth muscle IgG slightly elevated at 28.  ANA negative.  Mitochondrial antibodies negative.  Alpha 1 antitrypsin in normal range.  GGT elevated at 486.  INR  1.4.    Infectious enteritis seen in ED 05/05/21.  Symptoms resolved.  Findings on CT imaging noted above.  PAF 2018.  Stress test with possible ischemia but no coronary disease on subsequent catheter.  12/2019 echocardiogram with LVEF 55 to 60%.  Daily meds include 162 mg aspirin, no anticoagulation.  Status post cholecystectomy.     PLAN      Continue daily Rocephin, reordered.   Continue Protonix drip.  Timing for repeat EGD to be determined.  There has been a cancellation of procedure this afternoon and plan for EGD this afternoon.  Therefore continue NPO.    Folate and b12 levels.  Serial Hgb/hct.      Azucena Freed  06/30/2021, 9:37 AM Phone [phone]

## 2021-06-30 NOTE — Transfer of Care (Signed)
Immediate Anesthesia Transfer of Care Note  Patient: Marissa Weber  Procedure(s) Performed: ESOPHAGOGASTRODUODENOSCOPY (EGD) WITH PROPOFOL BIOPSY  Patient Location: Endoscopy Unit  Anesthesia Type:MAC  Level of Consciousness: awake, alert  and oriented  Airway & Oxygen Therapy: Patient Spontanous Breathing and Patient connected to nasal cannula oxygen  Post-op Assessment: Report given to RN and Post -op Vital signs reviewed and stable  Post vital signs: Reviewed and stable  Last Vitals:  Vitals Value Taken Time  BP    Temp    Pulse    Resp    SpO2 100     Last Pain:  Vitals:   06/30/21 1415  TempSrc: Temporal  PainSc: 2       Patients Stated Pain Goal: 0 (42/35/36 1443)  Complications: No notable events documented.

## 2021-06-30 NOTE — Anesthesia Procedure Notes (Signed)
Procedure Name: MAC Date/Time: 06/30/2021 2:53 PM Performed by: Alain Marion, CRNA Pre-anesthesia Checklist: Patient identified, Emergency Drugs available, Suction available and Patient being monitored Oxygen Delivery Method: Nasal cannula Placement Confirmation: positive ETCO2

## 2021-07-01 LAB — HEMOGLOBIN AND HEMATOCRIT, BLOOD
HCT: 25.2 % — ABNORMAL LOW (ref 36.0–46.0)
HCT: 26.3 % — ABNORMAL LOW (ref 36.0–46.0)
Hemoglobin: 8.2 g/dL — ABNORMAL LOW (ref 12.0–15.0)
Hemoglobin: 8.4 g/dL — ABNORMAL LOW (ref 12.0–15.0)

## 2021-07-01 MED ORDER — SODIUM CHLORIDE 0.9 % IV SOLN: INTRAVENOUS | Status: AC | PRN

## 2021-07-01 MED ORDER — FERROUS SULFATE 325 (65 FE) MG PO TABS
325.0000 mg | ORAL_TABLET | Freq: Three times a day (TID) | ORAL | Status: DC
  Administered 2021-07-01 – 2021-07-03 (×8): 325 mg via ORAL
  Filled 2021-07-01 (×8): qty 1

## 2021-07-01 MED ORDER — DOCUSATE SODIUM 100 MG PO CAPS: 100.0000 mg | ORAL_CAPSULE | Freq: Two times a day (BID) | ORAL | Status: DC | PRN

## 2021-07-01 NOTE — Progress Notes (Signed)
[facility] Progress Note  CC:  UGIB   Subjective:  Feels pretty good.  Wants to eat and excited that she can hopefully go home.  Had a BM last night that was brown.  Repeat EGD on 11/4 showed the following:  - Normal esophagus. - Hiatal hernia. - Non-bleeding gastric ulcer within the hernia. - An endoclip was found in the stomach. - A single duodenal polyp. Biopsied.  Objective:  Vital signs in last 24 hours: Temp:  [97.7 F (36.5 C)-98.6 F (37 C)] 98.6 F (37 C) (11/05 0422) Pulse Rate:  [81-99] 81 (11/05 0422) Resp:  [15-22] 15 (11/05 0422) BP: (82-116)/(55-67) 88/57 (11/05 0422) SpO2:  [92 %-97 %] 97 % (11/05 0422) Weight:  [74.5 kg] 74.5 kg (11/04 1415) Last BM Date: 06/30/21 General:  Alert, Well-developed, in NAD Heart:  Regular rate and rhythm; no murmurs Pulm:  Some coarse BS initially but cleared with cough and other breaths.   Abdomen:  Soft, non-distended.  BS present.  Mid-abdominal hernia noted with mild TTP. Extremities:  Without edema. Neurologic:  Alert and oriented x 4;  grossly normal neurologically. Psych:  Alert and cooperative. Normal mood and affect.  Intake/Output from previous day: 11/04 0701 - 11/05 0700 In: 2758.4 [I.V.:2658.4; IV Piggyback:100] Out: -  Lab Results: Recent Labs    06/29/21 1625 06/30/21 0143 06/30/21 1032 06/30/21 1704 07/01/21 0532  WBC 12.0* 9.8  --   --   --   HGB 10.2* 7.9* 8.0* 8.0* 8.2*  HCT 31.8* 24.7* 24.2* 24.5* 25.2*  PLT 246 201  --   --   --    BMET Recent Labs    06/29/21 1625 06/30/21 0143  NA 136 137  K 4.0 3.2*  CL 106 108  CO2 23 23  GLUCOSE 90 128*  BUN <5* <5*  CREATININE 0.47 0.55  CALCIUM 9.1 8.6*   LFT Recent Labs    06/30/21 0143  PROT 5.6*  ALBUMIN 2.2*  AST 42*  ALT 15  ALKPHOS 65  BILITOT 1.4*   PT/INR Recent Labs    06/29/21 1625 06/30/21 0143  LABPROT 16.7* 17.7*  INR 1.4* 1.5*   Portable chest 1 View  Result Date: 06/29/2021 CLINICAL DATA:   Esophageal varices EXAM: PORTABLE CHEST 1 VIEW COMPARISON:  12/05/2020 FINDINGS: The heart size and mediastinal contours are within normal limits. Both lungs are clear. The visualized skeletal structures are unremarkable. IMPRESSION: No active disease. Electronically Signed   By: Fidela Salisbury M.D.   On: 06/29/2021 19:46   ECHOCARDIOGRAM COMPLETE  Result Date: 06/30/2021    ECHOCARDIOGRAM REPORT   Patient Name:   Marissa Weber Date of Exam: 06/30/2021 Medical Rec #:  [id number]     Height:       58.0 in Accession #:    [id number]    Weight:       164.2 lb Date of Birth:  1961/01/20     BSA:          1.675 m Patient Age:    60 years      BP:           101/69 mmHg Patient Gender: F             HR:           86 bpm. Exam Location:  Inpatient Procedure: 2D Echo, 3D Echo, Cardiac Doppler, Color Doppler and Strain Analysis Indications:     R06.02 SOB  History:  Patient has prior history of Echocardiogram examinations, most                  recent 01/05/2020. Abnormal ECG, Arrythmias:Atrial Fibrillation;                  Signs/Symptoms:Shortness of Breath and Dyspnea.  Sonographer:     Roseanna Rainbow RDCS Referring Phys:  [place] Diagnosing Phys: [name] MD  Sonographer Comments: Global longitudinal strain was attempted. IMPRESSIONS  1. Left ventricular ejection fraction, by estimation, is 65 to 70%. The left ventricle has normal function. The left ventricle has no regional wall motion abnormalities. There is moderate concentric left ventricular hypertrophy. Left ventricular diastolic parameters are consistent with Grade I diastolic dysfunction (impaired relaxation).  2. Right ventricular systolic function is normal. The right ventricular size is normal.  3. Left atrial size was mildly dilated.  4. Right atrial size was mildly dilated.  5. The mitral valve is degenerative. Trivial mitral valve regurgitation.  6. The aortic valve is tricuspid. There is mild calcification of the aortic valve. Aortic valve  regurgitation is trivial. Mild aortic valve sclerosis is present, with no evidence of aortic valve stenosis.  7. The inferior vena cava is dilated in size with <50% respiratory variability, suggesting right atrial pressure of 15 mmHg. FINDINGS  Left Ventricle: Left ventricular ejection fraction, by estimation, is 65 to 70%. The left ventricle has normal function. The left ventricle has no regional wall motion abnormalities. The left ventricular internal cavity size was normal in size. There is  moderate concentric left ventricular hypertrophy. Left ventricular diastolic parameters are consistent with Grade I diastolic dysfunction (impaired relaxation). Right Ventricle: The right ventricular size is normal. No increase in right ventricular wall thickness. Right ventricular systolic function is normal. Left Atrium: Left atrial size was mildly dilated. Right Atrium: Right atrial size was mildly dilated. Pericardium: There is no evidence of pericardial effusion. Mitral Valve: The mitral valve is degenerative in appearance. Trivial mitral valve regurgitation. Tricuspid Valve: The tricuspid valve is normal in structure. Tricuspid valve regurgitation is trivial. Aortic Valve: The aortic valve is tricuspid. There is mild calcification of the aortic valve. Aortic valve regurgitation is trivial. Mild aortic valve sclerosis is present, with no evidence of aortic valve stenosis. Pulmonic Valve: The pulmonic valve was normal in structure. Pulmonic valve regurgitation is not visualized. Aorta: The aortic root is normal in size and structure. There is minimal (Grade I) atheroma plaque involving the aortic root and ascending aorta. Venous: The inferior vena cava is dilated in size with less than 50% respiratory variability, suggesting right atrial pressure of 15 mmHg. IAS/Shunts: The atrial septum is grossly normal.  LEFT VENTRICLE PLAX 2D LVIDd:         4.00 cm     Diastology LVIDs:         2.20 cm     LV e' medial:    7.93 cm/s  LV PW:         1.30 cm     LV E/e' medial:  12.4 LV IVS:        1.60 cm     LV e' lateral:   7.62 cm/s LVOT diam:     2.10 cm     LV E/e' lateral: 12.9 LV SV:         109 LV SV Index:   65 LVOT Area:     3.46 cm  LV Volumes (MOD) LV vol d, MOD A2C: 85.9 ml LV vol  d, MOD A4C: 67.9 ml LV vol s, MOD A2C: 30.9 ml LV vol s, MOD A4C: 22.3 ml LV SV MOD A2C:     55.0 ml LV SV MOD A4C:     67.9 ml LV SV MOD BP:      51.6 ml RIGHT VENTRICLE             IVC RV S prime:     22.40 cm/s  IVC diam: 1.80 cm TAPSE (M-mode): 2.2 cm LEFT ATRIUM             Index        RIGHT ATRIUM           Index LA diam:        3.70 cm 2.21 cm/m   RA Area:     14.60 cm LA Vol (A2C):   54.6 ml 32.60 ml/m  RA Volume:   34.30 ml  20.48 ml/m LA Vol (A4C):   29.5 ml 17.61 ml/m LA Biplane Vol: 40.4 ml 24.12 ml/m  AORTIC VALVE LVOT Vmax:   152.00 cm/s LVOT Vmean:  110.000 cm/s LVOT VTI:    0.315 m  AORTA Ao Root diam: 3.10 cm Ao Asc diam:  3.60 cm MITRAL VALVE MV Area (PHT): 4.68 cm     SHUNTS MV Decel Time: 162 msec     Systemic VTI:  0.32 m MV E velocity: 98.50 cm/s   Systemic Diam: 2.10 cm MV A velocity: 124.00 cm/s MV E/A ratio:  0.79 [name] MD Electronically signed by [name] MD Signature Date/Time: 06/30/2021/5:40:34 PM    Final     Assessment / Plan: Acute bleeding at time of EGD 06/29/2021.  Study performed for variceal screening in a cirrhotic.  Study revealed large volume fresh and clotted blood in the gastric fundus and body.  Actively bleeding spot in gastric cardia was treated with single Hemoclip, bleeding resolved.  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  Hgb stable this AM at 8.2 grams.  No sign of active bleeding on repeat EGD 11/4.  -Advance diet to soft. -Continue pantoprazole 40 mg BID for 8 weeks. -We will follow-up pathology results. -Please send her home on cipro 500 mg BID for 7 days for GI bleeding in a cirrhotic patient.   LOS: 2 days   Laban Emperor. Brad Mcgaughy   07/01/2021, 9:07 AM

## 2021-07-01 NOTE — Progress Notes (Signed)
Subjective:  Patient denies any chest pain or shortness of breath.  Denies abdominal pain.  States feels hungry has been tolerating clear liquids.  Has not been up yet.  BP soft.  Objective:  Vital Signs in the last 24 hours: Temp:  [97.7 F (36.5 C)-98.6 F (37 C)] 98.6 F (37 C) (11/05 0422) Pulse Rate:  [81-99] 81 (11/05 0422) Resp:  [15-22] 15 (11/05 0422) BP: (82-116)/(55-67) 88/57 (11/05 0422) SpO2:  [92 %-97 %] 97 % (11/05 0422) Weight:  [74.5 kg] 74.5 kg (11/04 1415)  Intake/Output from previous day: 11/04 0701 - 11/05 0700 In: 2758.4 [I.V.:2658.4; IV Piggyback:100] Out: -  Intake/Output from this shift: No intake/output data recorded.  Physical Exam: Neck: no adenopathy, no carotid bruit, no JVD, and supple, symmetrical, trachea midline Lungs: clear to auscultation bilaterally Heart: regular rate and rhythm, S1, S2 normal, and 2/6 systolic murmur noted Abdomen: soft, non-tender; bowel sounds normal; no masses,  no organomegaly Extremities: extremities normal, atraumatic, no cyanosis or edema  Lab Results: Recent Labs    06/29/21 1625 06/30/21 0143 06/30/21 1032 06/30/21 1704 07/01/21 0532  WBC 12.0* 9.8  --   --   --   HGB 10.2* 7.9*   < > 8.0* 8.2*  PLT 246 201  --   --   --    < > = values in this interval not displayed.   Recent Labs    06/29/21 1625 06/30/21 0143  NA 136 137  K 4.0 3.2*  CL 106 108  CO2 23 23  GLUCOSE 90 128*  BUN <5* <5*  CREATININE 0.47 0.55   No results for input(s): TROPONINI in the last 72 hours.  Invalid input(s): CK, MB Hepatic Function Panel Recent Labs    06/30/21 0143  PROT 5.6*  ALBUMIN 2.2*  AST 42*  ALT 15  ALKPHOS 65  BILITOT 1.4*   No results for input(s): CHOL in the last 72 hours. No results for input(s): PROTIME in the last 72 hours.  Imaging: Portable chest 1 View  Result Date: 06/29/2021 CLINICAL DATA:  Esophageal varices EXAM: PORTABLE CHEST 1 VIEW COMPARISON:  12/05/2020 FINDINGS: The heart  size and mediastinal contours are within normal limits. Both lungs are clear. The visualized skeletal structures are unremarkable. IMPRESSION: No active disease. Electronically Signed   By: Fidela Salisbury M.D.   On: 06/29/2021 19:46   ECHOCARDIOGRAM COMPLETE  Result Date: 06/30/2021    ECHOCARDIOGRAM REPORT   Patient Name:   Marissa Weber Date of Exam: 06/30/2021 Medical Rec #:  [id number]     Height:       58.0 in Accession #:    [id number]    Weight:       164.2 lb Date of Birth:  [birth year]     BSA:          1.675 m Patient Age:    60 years      BP:           101/69 mmHg Patient Gender: F             HR:           86 bpm. Exam Location:  Inpatient Procedure: 2D Echo, 3D Echo, Cardiac Doppler, Color Doppler and Strain Analysis Indications:     R06.02 SOB  History:         Patient has prior history of Echocardiogram examinations, most  recent 01/05/2020. Abnormal ECG, Arrythmias:Atrial Fibrillation;                  Signs/Symptoms:Shortness of Breath and Dyspnea.  Sonographer:     Roseanna Rainbow RDCS Referring Phys:  [place] Diagnosing Phys: [name] MD  Sonographer Comments: Global longitudinal strain was attempted. IMPRESSIONS  1. Left ventricular ejection fraction, by estimation, is 65 to 70%. The left ventricle has normal function. The left ventricle has no regional wall motion abnormalities. There is moderate concentric left ventricular hypertrophy. Left ventricular diastolic parameters are consistent with Grade I diastolic dysfunction (impaired relaxation).  2. Right ventricular systolic function is normal. The right ventricular size is normal.  3. Left atrial size was mildly dilated.  4. Right atrial size was mildly dilated.  5. The mitral valve is degenerative. Trivial mitral valve regurgitation.  6. The aortic valve is tricuspid. There is mild calcification of the aortic valve. Aortic valve regurgitation is trivial. Mild aortic valve sclerosis is present, with no evidence of  aortic valve stenosis.  7. The inferior vena cava is dilated in size with <50% respiratory variability, suggesting right atrial pressure of 15 mmHg. FINDINGS  Left Ventricle: Left ventricular ejection fraction, by estimation, is 65 to 70%. The left ventricle has normal function. The left ventricle has no regional wall motion abnormalities. The left ventricular internal cavity size was normal in size. There is  moderate concentric left ventricular hypertrophy. Left ventricular diastolic parameters are consistent with Grade I diastolic dysfunction (impaired relaxation). Right Ventricle: The right ventricular size is normal. No increase in right ventricular wall thickness. Right ventricular systolic function is normal. Left Atrium: Left atrial size was mildly dilated. Right Atrium: Right atrial size was mildly dilated. Pericardium: There is no evidence of pericardial effusion. Mitral Valve: The mitral valve is degenerative in appearance. Trivial mitral valve regurgitation. Tricuspid Valve: The tricuspid valve is normal in structure. Tricuspid valve regurgitation is trivial. Aortic Valve: The aortic valve is tricuspid. There is mild calcification of the aortic valve. Aortic valve regurgitation is trivial. Mild aortic valve sclerosis is present, with no evidence of aortic valve stenosis. Pulmonic Valve: The pulmonic valve was normal in structure. Pulmonic valve regurgitation is not visualized. Aorta: The aortic root is normal in size and structure. There is minimal (Grade I) atheroma plaque involving the aortic root and ascending aorta. Venous: The inferior vena cava is dilated in size with less than 50% respiratory variability, suggesting right atrial pressure of 15 mmHg. IAS/Shunts: The atrial septum is grossly normal.  LEFT VENTRICLE PLAX 2D LVIDd:         4.00 cm     Diastology LVIDs:         2.20 cm     LV e' medial:    7.93 cm/s LV PW:         1.30 cm     LV E/e' medial:  12.4 LV IVS:        1.60 cm     LV e'  lateral:   7.62 cm/s LVOT diam:     2.10 cm     LV E/e' lateral: 12.9 LV SV:         109 LV SV Index:   65 LVOT Area:     3.46 cm  LV Volumes (MOD) LV vol d, MOD A2C: 85.9 ml LV vol d, MOD A4C: 67.9 ml LV vol s, MOD A2C: 30.9 ml LV vol s, MOD A4C: 22.3 ml LV SV MOD A2C:  55.0 ml LV SV MOD A4C:     67.9 ml LV SV MOD BP:      51.6 ml RIGHT VENTRICLE             IVC RV S prime:     22.40 cm/s  IVC diam: 1.80 cm TAPSE (M-mode): 2.2 cm LEFT ATRIUM             Index        RIGHT ATRIUM           Index LA diam:        3.70 cm 2.21 cm/m   RA Area:     14.60 cm LA Vol (A2C):   54.6 ml 32.60 ml/m  RA Volume:   34.30 ml  20.48 ml/m LA Vol (A4C):   29.5 ml 17.61 ml/m LA Biplane Vol: 40.4 ml 24.12 ml/m  AORTIC VALVE LVOT Vmax:   152.00 cm/s LVOT Vmean:  110.000 cm/s LVOT VTI:    0.315 m  AORTA Ao Root diam: 3.10 cm Ao Asc diam:  3.60 cm MITRAL VALVE MV Area (PHT): 4.68 cm     SHUNTS MV Decel Time: 162 msec     Systemic VTI:  0.32 m MV E velocity: 98.50 cm/s   Systemic Diam: 2.10 cm MV A velocity: 124.00 cm/s MV E/A ratio:  0.79 [name] MD Electronically signed by [name] MD Signature Date/Time: 06/30/2021/5:40:34 PM    Final     Cardiac Studies:  Assessment/Plan:  Status post acute upper GI bleed status post EGD Gastric ulcer with no evidence of active bleeding HTN Tobacco use disorder Alcohol use disorder Plan Advance diet as tolerated IV fluid normal saline 500 cc bolus Check labs in a.m. Increase ambulation as tolerated Possible discharge tomorrow if stable  LOS: 2 days    Marissa Weber 07/01/2021, 9:56 AM

## 2021-07-02 ENCOUNTER — Encounter (HOSPITAL_COMMUNITY): Payer: Self-pay | Admitting: Internal Medicine

## 2021-07-02 LAB — CBC
HCT: 23.5 % — ABNORMAL LOW (ref 36.0–46.0)
Hemoglobin: 7.7 g/dL — ABNORMAL LOW (ref 12.0–15.0)
MCH: 23.3 pg — ABNORMAL LOW (ref 26.0–34.0)
MCHC: 32.8 g/dL (ref 30.0–36.0)
MCV: 71.2 fL — ABNORMAL LOW (ref 80.0–100.0)
Platelets: 166 10*3/uL (ref 150–400)
RBC: 3.3 MIL/uL — ABNORMAL LOW (ref 3.87–5.11)
RDW: 16 % — ABNORMAL HIGH (ref 11.5–15.5)
WBC: 7.5 10*3/uL (ref 4.0–10.5)
nRBC: 0 % (ref 0.0–0.2)

## 2021-07-02 LAB — FOLATE RBC
Folate, Hemolysate: 231 ng/mL
Folate, RBC: 920 ng/mL (ref >498)
Hematocrit: 25.1 % — ABNORMAL LOW (ref 34.0–46.6)

## 2021-07-02 NOTE — Progress Notes (Signed)
Subjective:  Complains of vague abdominal discomfort states noted dark stool which she attributes to iron tablets yesterday also feels weak.  Hemoglobin slightly drifting down.  Blood pressure improved.  Denies any dizziness lightheadedness.  Objective:  Vital Signs in the last 24 hours: Temp:  [97.9 F (36.6 C)-98.3 F (36.8 C)] 98.3 F (36.8 C) (11/06 0752) Pulse Rate:  [82-97] 95 (11/06 0752) Resp:  [18-20] 20 (11/06 0752) BP: (95-115)/(66-70) 115/66 (11/06 0752) SpO2:  [92 %-98 %] 94 % (11/06 0752)  Intake/Output from previous day: 11/05 0701 - 11/06 0700 In: 240 [P.O.:240] Out: -  Intake/Output from this shift: No intake/output data recorded.  Physical Exam: Neck: no adenopathy, no carotid bruit, no JVD, and supple, symmetrical, trachea midline Lungs: clear to auscultation bilaterally Heart: regular rate and rhythm, S1, S2 normal, and 2/6 systolic murmur noted Abdomen: soft, non-tender; bowel sounds normal; no masses,  no organomegaly Extremities: extremities normal, atraumatic, no cyanosis or edema  Lab Results: Recent Labs    06/30/21 0143 06/30/21 1032 07/01/21 1707 07/02/21 0610  WBC 9.8  --   --  7.5  HGB 7.9*   < > 8.4* 7.7*  PLT 201  --   --  166   < > = values in this interval not displayed.   Recent Labs    06/29/21 1625 06/30/21 0143  NA 136 137  K 4.0 3.2*  CL 106 108  CO2 23 23  GLUCOSE 90 128*  BUN <5* <5*  CREATININE 0.47 0.55   No results for input(s): TROPONINI in the last 72 hours.  Invalid input(s): CK, MB Hepatic Function Panel Recent Labs    06/30/21 0143  PROT 5.6*  ALBUMIN 2.2*  AST 42*  ALT 15  ALKPHOS 65  BILITOT 1.4*   No results for input(s): CHOL in the last 72 hours. No results for input(s): PROTIME in the last 72 hours.  Imaging: No results found.  Cardiac Studies:  Assessment/Plan:  Status post acute upper GI bleed status post EGD Gastric ulcer with no evidence of active bleeding HTN Tobacco use  disorder Alcohol use disorder Plan Continue present management Increase ambulation as tolerated Check CBC in a.m. if stable hopefully discharge tomorrow  LOS: 3 days    Charolette Forward 07/02/2021, 11:28 AM

## 2021-07-02 NOTE — Progress Notes (Signed)
Mobility Specialist Progress Note:   07/02/21 1142  Mobility  Activity Ambulated in hall  Level of Assistance Standby assist, set-up cues, supervision of patient - no hands on  Assistive Device Four wheel walker  Distance Ambulated (ft) 250 ft  Mobility Ambulated with assistance in hallway  Mobility Response Tolerated well  Mobility performed by Mobility specialist  $Mobility charge 1 Mobility   Pt received EOB willing to participate in mobility. No complaints of pain and asymptomatic. Pt left EOB with call bell in reach and all needs met.   [facility] Health and safety inspector Phone [phone]

## 2021-07-03 ENCOUNTER — Telehealth: Payer: Self-pay

## 2021-07-03 LAB — CBC
HCT: 24.1 % — ABNORMAL LOW (ref 36.0–46.0)
Hemoglobin: 7.7 g/dL — ABNORMAL LOW (ref 12.0–15.0)
MCH: 23.5 pg — ABNORMAL LOW (ref 26.0–34.0)
MCHC: 32 g/dL (ref 30.0–36.0)
MCV: 73.5 fL — ABNORMAL LOW (ref 80.0–100.0)
Platelets: 162 10*3/uL (ref 150–400)
RBC: 3.28 MIL/uL — ABNORMAL LOW (ref 3.87–5.11)
RDW: 16.4 % — ABNORMAL HIGH (ref 11.5–15.5)
WBC: 8.7 10*3/uL (ref 4.0–10.5)
nRBC: 0 % (ref 0.0–0.2)

## 2021-07-03 LAB — H. PYLORI ANTIBODY, IGG: H Pylori IgG: 2.49 Index Value — ABNORMAL HIGH (ref 0.00–0.79)

## 2021-07-03 MED ORDER — DOCUSATE SODIUM 100 MG PO CAPS
100.0000 mg | ORAL_CAPSULE | Freq: Two times a day (BID) | ORAL | 0 refills | Status: DC | PRN
Start: 2021-07-03 — End: 2023-04-25

## 2021-07-03 MED ORDER — PANTOPRAZOLE SODIUM 40 MG PO TBEC: 40.0000 mg | DELAYED_RELEASE_TABLET | Freq: Two times a day (BID) | ORAL | Status: DC

## 2021-07-03 MED ORDER — ALBUTEROL SULFATE HFA 108 (90 BASE) MCG/ACT IN AERS: 2.0000 | INHALATION_SPRAY | Freq: Four times a day (QID) | RESPIRATORY_TRACT | 2 refills | Status: AC | PRN

## 2021-07-03 MED ORDER — TIZANIDINE HCL 2 MG PO TABS
2.0000 mg | ORAL_TABLET | Freq: Every evening | ORAL | 1 refills | Status: DC | PRN
Start: 2021-07-03 — End: 2021-08-29

## 2021-07-03 MED ORDER — FERROUS SULFATE 325 (65 FE) MG PO TABS: 325.0000 mg | ORAL_TABLET | Freq: Three times a day (TID) | ORAL | 3 refills | Status: DC

## 2021-07-03 MED ORDER — PANTOPRAZOLE SODIUM 40 MG PO TBEC: 40.0000 mg | DELAYED_RELEASE_TABLET | Freq: Two times a day (BID) | ORAL | 6 refills | Status: DC

## 2021-07-03 NOTE — Telephone Encounter (Signed)
Patient Rescheduled with Dr.Cunningham 08/04/21.Per Dr.Dorsey patient needed to be scheduled with Dr.Cunningham.

## 2021-07-03 NOTE — Discharge Summary (Signed)
Physician Discharge Summary  Patient ID: Marissa Weber MRN: [id number] DOB/AGE: 1961/02/11 60 y.o.  Admit date: 06/29/2021 Discharge date: 07/03/2021  Admission Diagnoses: Acute upper GI bleed Esophageal varices Alcoholic liver disease with portal hypertension HTN Tobacco use disorder Alcohol use disorder  Discharge Diagnoses:  Principle diagnosis: Acute upper GI bleed Active Problems:   Gastric ulcer   Alcoholic liver disease with cirrhosis   Anemia of blood loss   HTN   Hypertrophic non-obstructive cardiomyopathy   Tobacco use disorder   Alcohol use disorder   Obesity  Discharged Condition: fair  Hospital Course: 60 years old black female with PMH of asthma, atrial fibrillation, alcoholic liver disease had upper endoscopy by her GI doctor earlier on day of admission. She was found to have active GI bleed. Stomach ulcer was stapled and patient was sent for admission for further treatment. She dropped her hemoglobin from 10.2 to 7.7 gm. over 4 days. Her repeat EGD showed absence of active bleed and gastric ulcer with staple. She had IV Protonix followed by oral. She will see me in 3 days and GI doctor as arranged. She agrees to give up alcohol intake and decrease tobacco use to minimum.  Consults: GI  Significant Diagnostic Studies: labs: Hgb 10.2 gm on admission and 7.7 on day of discharge. H.Pylori antibody, pending. LFT: AST 400 U, ALT 119 Alk phosphatase 168 U and Bilirubin 0.9, climbing to 5.1 and down to 1.4 mg on day of discharge. INR was 1.5 B-12 level is normal.   EKG: Sinus rhythm, low voltage, early repolarization.  CXR: No active disease  EGD: non-bleeding gastric ulcer within the hernia.  Echocardiogram: Moderate LVH without obstruction and normal LV systolic function.  Treatments: IV pantoprazole, IV fluids and oral iron.  Discharge Exam: Blood pressure 99/62, pulse 77, temperature 98.1 F (36.7 C), temperature source Oral, resp. rate 20, height 4'  11.5" (1.511 m), weight 74.5 kg, SpO2 97 %. General appearance: alert, cooperative and appears stated age. Head: Normocephalic, atraumatic. Eyes: Brown eyes, pale conjunctiva, corneas clear. .  Neck: No adenopathy, no carotid bruit, no JVD, supple, symmetrical, trachea midline and thyroid not enlarged. Resp: Clear to auscultation bilaterally. Cardio: Regular rate and rhythm, S1, S2 normal, II/VI systolic murmur, no click, rub or gallop. GI: Soft, non-tender; bowel sounds normal; no organomegaly. Extremities: No edema, cyanosis or clubbing. Skin: Warm and dry.  Neurologic: Alert and oriented X 3, normal strength and tone. Normal coordination and slow gait with cane.  Disposition: Discharge disposition: 01-Home or Self Care        Allergies as of 07/03/2021       Reactions   Bee Venom Swelling, Other (See Comments)   Swelling wherever the sting is. Also gets dizzy   Lisinopril Swelling        Medication List     STOP taking these medications    aspirin EC 81 MG tablet   metoprolol succinate 50 MG 24 hr tablet Commonly known as: TOPROL-XL       TAKE these medications    albuterol 108 (90 Base) MCG/ACT inhaler Commonly known as: VENTOLIN HFA Inhale 2 puffs into the lungs every 6 (six) hours as needed for wheezing or shortness of breath. What changed: when to take this   docusate sodium 100 MG capsule Commonly known as: COLACE Take 1 capsule (100 mg total) by mouth 2 (two) times daily as needed for mild constipation.   ferrous sulfate 325 (65 FE) MG tablet Take 1 tablet (325 mg  total) by mouth 3 (three) times daily with meals.   tiZANidine 2 MG tablet Commonly known as: ZANAFLEX Take 1 tablet (2 mg total) by mouth at bedtime as needed for muscle spasms.   Vitamin D (Ergocalciferol) 1.25 MG (50000 UNIT) Caps capsule Commonly known as: DRISDOL Take 50,000 Units by mouth once a week.        Follow-up Information     [name], MD Follow up in 3 day(s).    Specialty: Cardiology Contact information: [place] [address] [phone]                 Time spent: Review of old chart, current chart, lab, x-ray, cardiac tests and discussion with patient over 60 minutes.  Signed: Birdie Riddle 07/03/2021, 3:22 PM

## 2021-07-03 NOTE — Progress Notes (Signed)
Discharge instructions provided to patient. All medications, follow up appointments, and discharge instructions provided. IV out. Monitor off CCMD notified. Discharging to home with family.  Fayne Mcguffee R Renita Brocks, RN  

## 2021-07-04 ENCOUNTER — Telehealth: Payer: Self-pay

## 2021-07-04 LAB — SURGICAL PATHOLOGY

## 2021-07-04 NOTE — Telephone Encounter (Signed)
Called patient to schedule appointment with Dr.Cunningham. VM was full unable to leave message.

## 2021-07-04 NOTE — Progress Notes (Signed)
Hi Ammie, please call the patient and let her know that her polyp that was removed from her small bowel showed a precancerous lesion. I would recommend that she follow up with Dr. Candis Schatz to fully removed any residual areas of this polyp from the small bowel.   Hi Maya, for some reason I see that there was an appointment with Dr. Candis Schatz that was cancelled. Please reschedule a follow up appt with Dr. Candis Schatz if possible.  Hi Scott, just tagging you so that you are aware of this result and to schedule a follow up EGD with you vs wait until you see her in clinic. I think you also wanted to do a colonoscopy on her so it can be double! :)

## 2021-07-26 IMAGING — DX DG RIBS W/ CHEST 3+V*L*
3 series · 3 of 3 positions shown · non-contrast
Comparison: 12/24/2018

CLINICAL DATA: Fell, left lateral rib pain

EXAM:
LEFT RIBS AND CHEST - 3+ VIEW

[chest pa]
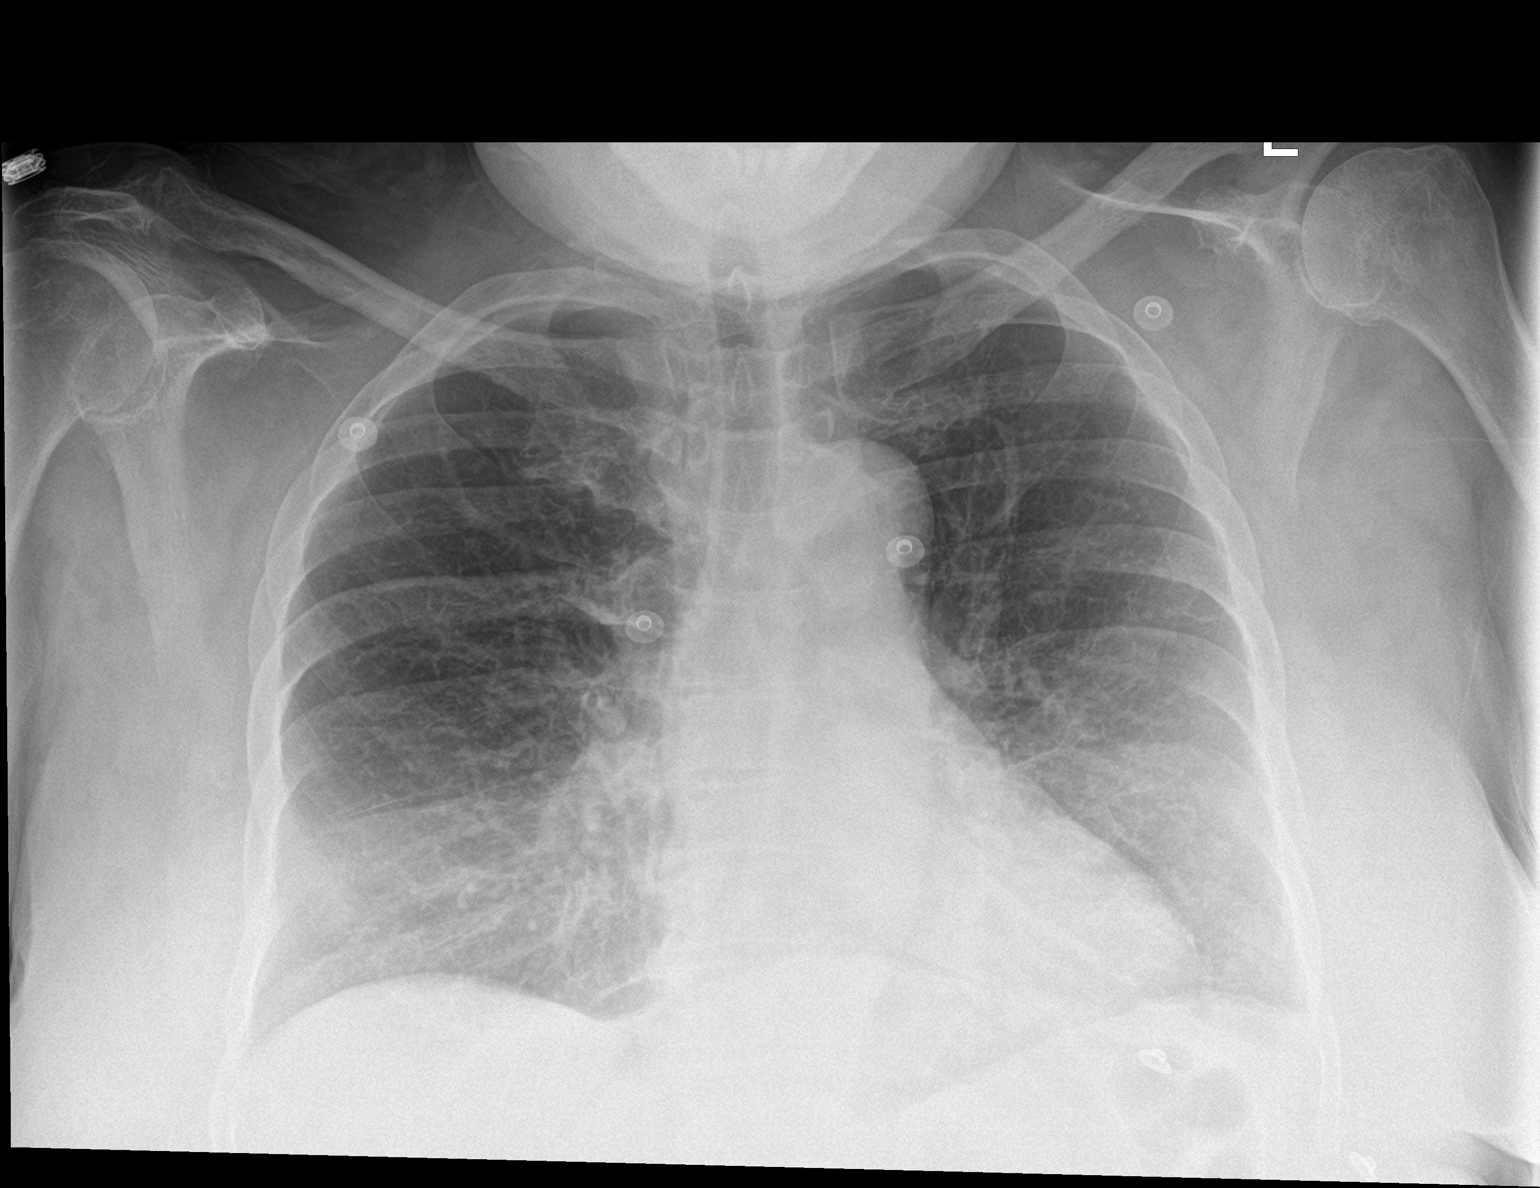

[rib pa obl]
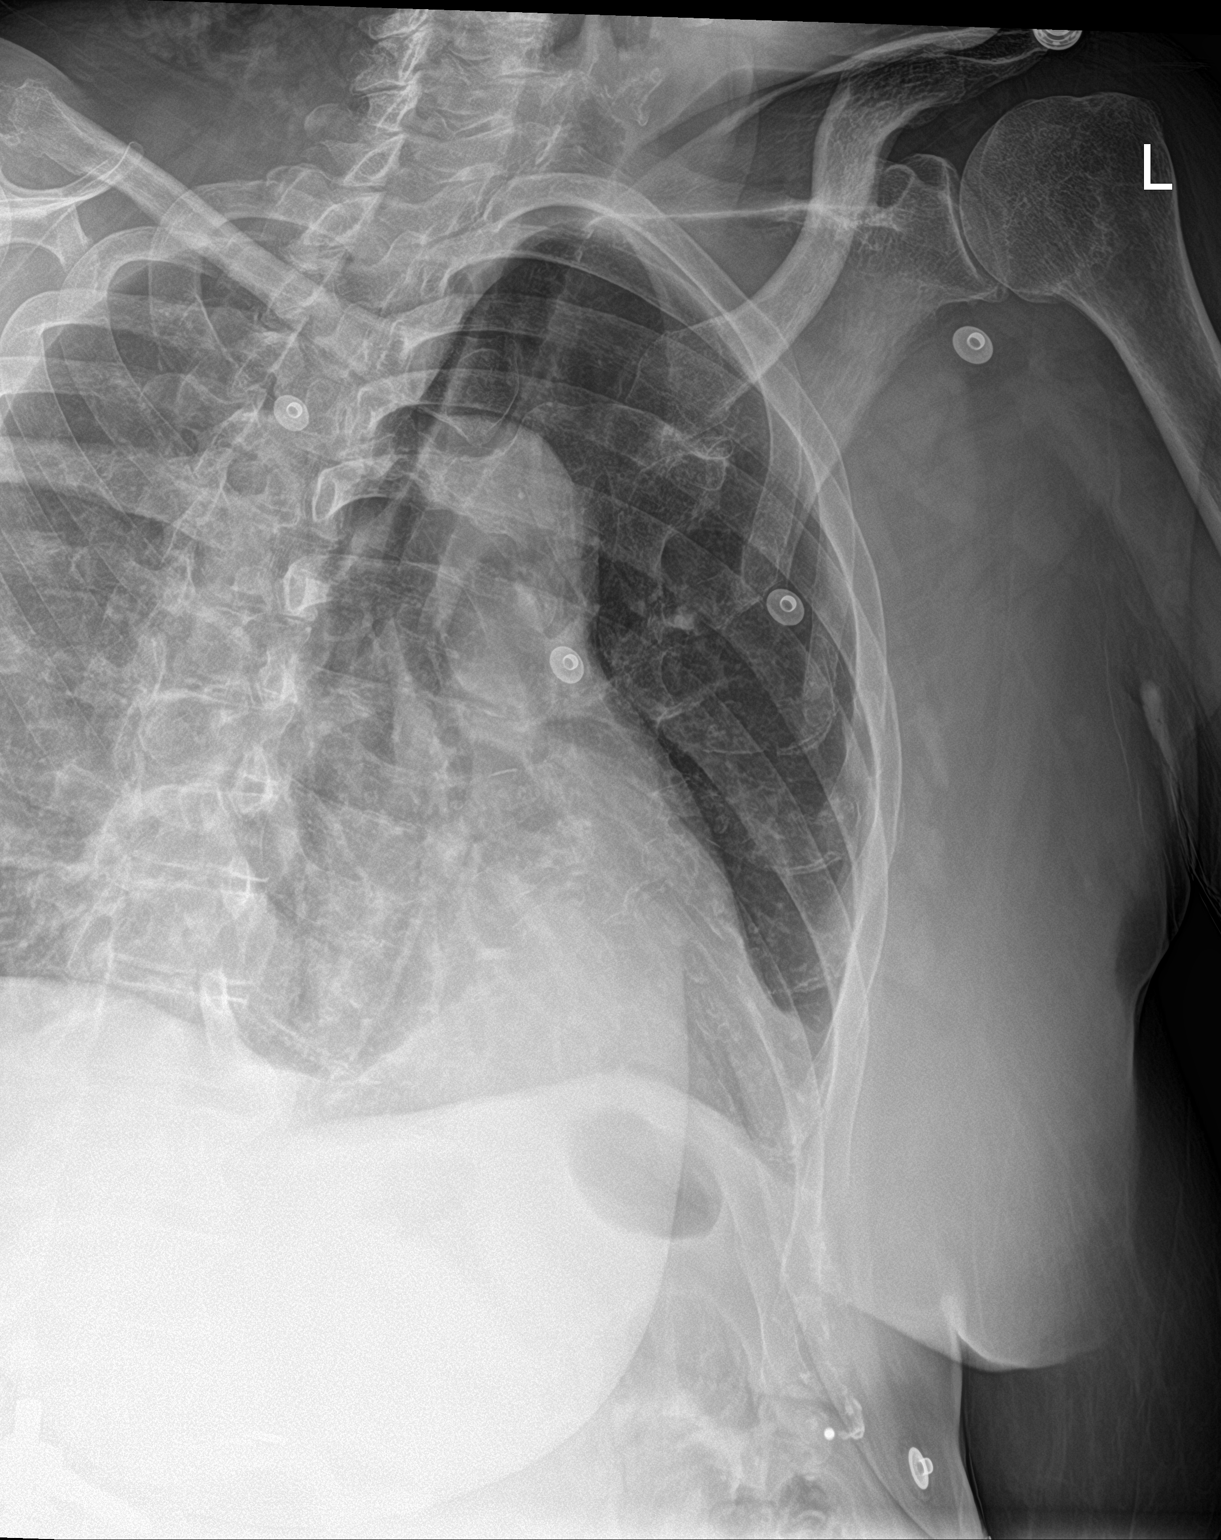

[rib pa]
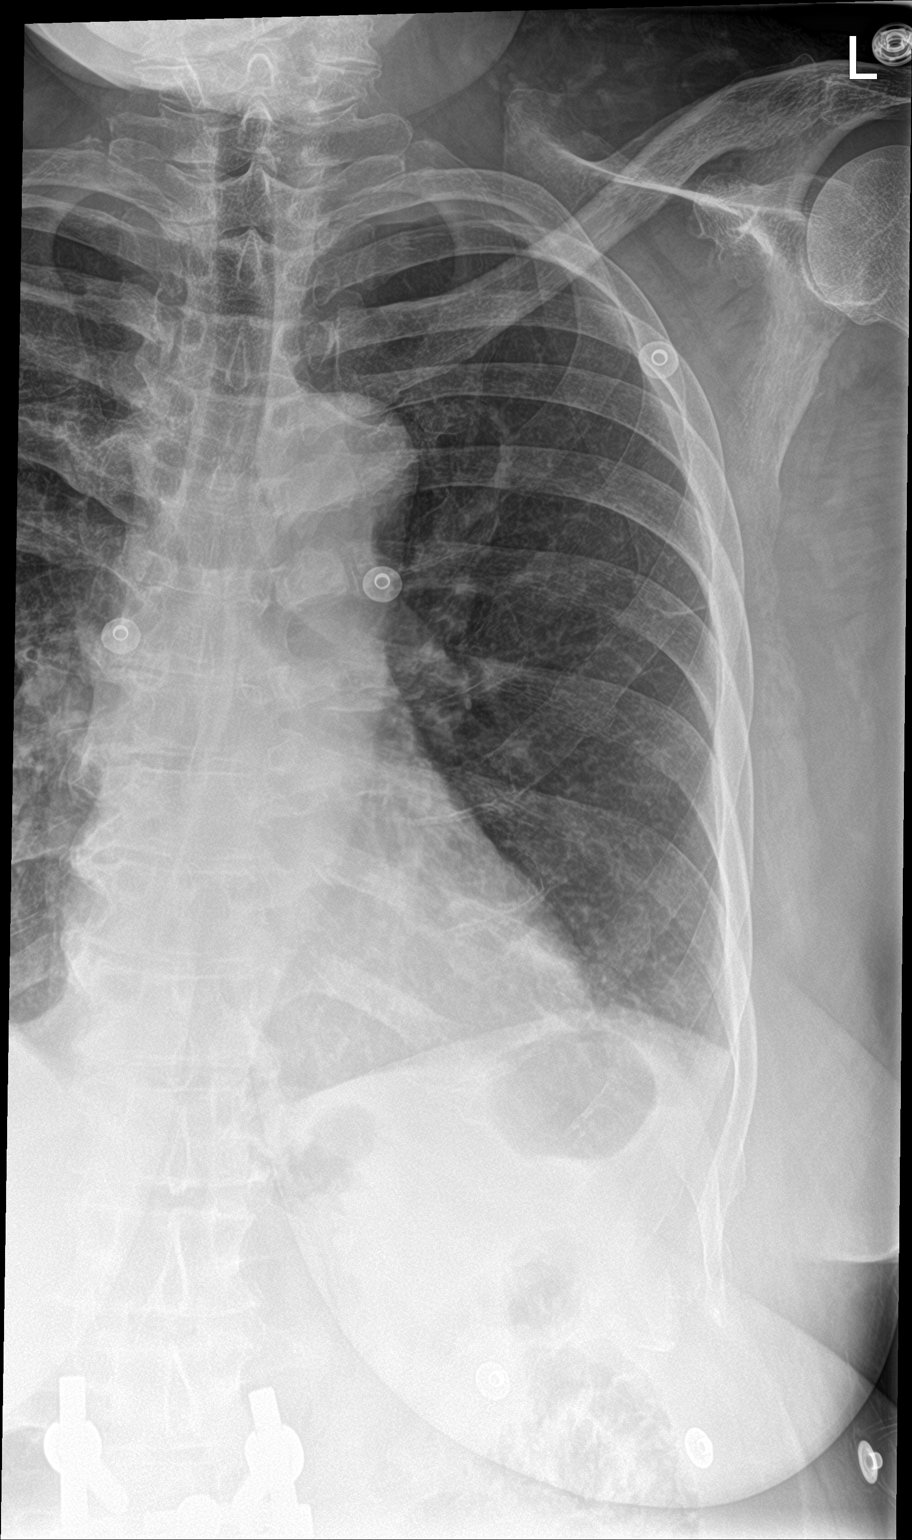

[3 of 3 positions shown; findings below may reference images not displayed]

FINDINGS: Frontal and oblique views of the left thoracic cage are obtained.
Cardiac silhouette is unremarkable. No airspace disease, effusion,
or pneumothorax. No acute displaced rib fractures.
IMPRESSION: 1. No acute intrathoracic process.

## 2021-08-04 ENCOUNTER — Ambulatory Visit: Payer: Medicaid Other | Admitting: Gastroenterology

## 2021-08-04 ENCOUNTER — Telehealth: Payer: Self-pay | Admitting: Gastroenterology

## 2021-08-04 NOTE — Telephone Encounter (Signed)
Good Afternoon Dr. Candis Schatz,  Patient called and stated that she was going to be late for her appointment.  We rescheduled her for 12/19 at 3:40

## 2021-08-14 ENCOUNTER — Ambulatory Visit: Payer: Medicaid Other | Admitting: Gastroenterology

## 2021-08-29 ENCOUNTER — Encounter: Payer: Self-pay | Admitting: Gastroenterology

## 2021-08-29 ENCOUNTER — Other Ambulatory Visit (INDEPENDENT_AMBULATORY_CARE_PROVIDER_SITE_OTHER): Payer: Medicaid Other

## 2021-08-29 ENCOUNTER — Ambulatory Visit (INDEPENDENT_AMBULATORY_CARE_PROVIDER_SITE_OTHER): Payer: Medicaid Other | Admitting: Gastroenterology

## 2021-08-29 VITALS — BP 128/68 | HR 88 | Ht 59.5 in | Wt 169.6 lb

## 2021-08-29 DIAGNOSIS — Z8719 Personal history of other diseases of the digestive system: Secondary | ICD-10-CM

## 2021-08-29 DIAGNOSIS — Z1211 Encounter for screening for malignant neoplasm of colon: Secondary | ICD-10-CM | POA: Diagnosis not present

## 2021-08-29 DIAGNOSIS — K7030 Alcoholic cirrhosis of liver without ascites: Secondary | ICD-10-CM

## 2021-08-29 DIAGNOSIS — D132 Benign neoplasm of duodenum: Secondary | ICD-10-CM

## 2021-08-29 DIAGNOSIS — Z1212 Encounter for screening for malignant neoplasm of rectum: Secondary | ICD-10-CM

## 2021-08-29 LAB — CBC WITH DIFFERENTIAL/PLATELET
Basophils Absolute: 0.1 10*3/uL (ref 0.0–0.1)
Basophils Relative: 1.1 % (ref 0.0–3.0)
Eosinophils Absolute: 0.1 10*3/uL (ref 0.0–0.7)
Eosinophils Relative: 0.6 % (ref 0.0–5.0)
HCT: 41.8 % (ref 36.0–46.0)
Hemoglobin: 13.3 g/dL (ref 12.0–15.0)
Lymphocytes Relative: 53.3 % — ABNORMAL HIGH (ref 12.0–46.0)
Lymphs Abs: 4.5 10*3/uL — ABNORMAL HIGH (ref 0.7–4.0)
MCHC: 31.8 g/dL (ref 30.0–36.0)
MCV: 72.8 fl — ABNORMAL LOW (ref 78.0–100.0)
Monocytes Absolute: 0.8 10*3/uL (ref 0.1–1.0)
Monocytes Relative: 9.6 % (ref 3.0–12.0)
Neutro Abs: 3 10*3/uL (ref 1.4–7.7)
Neutrophils Relative %: 35.4 % — ABNORMAL LOW (ref 43.0–77.0)
Platelets: 233 10*3/uL (ref 150.0–400.0)
RBC: 5.74 Mil/uL — ABNORMAL HIGH (ref 3.87–5.11)
RDW: 18.2 % — ABNORMAL HIGH (ref 11.5–15.5)
WBC: 8.5 10*3/uL (ref 4.0–10.5)

## 2021-08-29 LAB — HEPATIC FUNCTION PANEL
ALT: 17 U/L (ref 0–35)
AST: 58 U/L — ABNORMAL HIGH (ref 0–37)
Albumin: 4 g/dL (ref 3.5–5.2)
Alkaline Phosphatase: 127 U/L — ABNORMAL HIGH (ref 39–117)
Bilirubin, Direct: 0.3 mg/dL (ref 0.0–0.3)
Total Bilirubin: 0.9 mg/dL (ref 0.2–1.2)
Total Protein: 8.5 g/dL — ABNORMAL HIGH (ref 6.0–8.3)

## 2021-08-29 LAB — BASIC METABOLIC PANEL
BUN: 5 mg/dL — ABNORMAL LOW (ref 6–23)
CO2: 28 mEq/L (ref 19–32)
Calcium: 9.4 mg/dL (ref 8.4–10.5)
Chloride: 99 mEq/L (ref 96–112)
Creatinine, Ser: 0.67 mg/dL (ref 0.40–1.20)
GFR: 95.04 mL/min (ref >60.00)
Glucose, Bld: 65 mg/dL — ABNORMAL LOW (ref 70–99)
Potassium: 3.8 mEq/L (ref 3.5–5.1)
Sodium: 139 mEq/L (ref 135–145)

## 2021-08-29 MED ORDER — NA SULFATE-K SULFATE-MG SULF 17.5-3.13-1.6 GM/177ML PO SOLN: 1.0000 | Freq: Once | ORAL | 0 refills | Status: AC

## 2021-08-29 MED ORDER — TIZANIDINE HCL 2 MG PO TABS: 2.0000 mg | ORAL_TABLET | Freq: Every evening | ORAL | 1 refills | Status: DC | PRN

## 2021-08-29 NOTE — Patient Instructions (Addendum)
If you are age 61 or older, your body mass index should be between 23-30. Your Body mass index is 33.68 kg/m. If this is out of the aforementioned range listed, please consider follow up with your Primary Care Provider.  If you are age 56 or younger, your body mass index should be between 19-25. Your Body mass index is 33.68 kg/m. If this is out of the aformentioned range listed, please consider follow up with your Primary Care Provider.  Your provider has requested that you go to the basement level for lab work before leaving today. Press "B" on the elevator. The lab is located at the first door on the left as you exit the elevator.    You have been scheduled for an endoscopy and colonoscopy. Please follow the written instructions given to you at your visit today. Please pick up your prep supplies at the pharmacy within the next 1-3 days. If you use inhalers (even only as needed), please bring them with you on the day of your procedure.  Due to recent changes in healthcare laws, you may see the results of your imaging and laboratory studies on MyChart before your provider has had a chance to review them.  We understand that in some cases there may be results that are confusing or concerning to you. Not all laboratory results come back in the same time frame and the provider may be waiting for multiple results in order to interpret others.  Please give [place] 48 hours in order for your provider to thoroughly review all the results before contacting the office for clarification of your results.     The [place] [facility] providers would like to encourage you to use [facility] to communicate with providers for non-urgent requests or questions.  Due to long hold times on the telephone, sending your provider a message by [facility] may be a faster and more efficient way to get a response.  Please allow 48 business hours for a response.  Please remember that this is for non-urgent requests.   It was a pleasure to see you  today!  Thank you for trusting me with your gastrointestinal care!    Scott E.Candis Schatz, MD

## 2021-08-29 NOTE — Progress Notes (Signed)
HPI : Marissa Weber is a very pleasant 61 year old female who I first saw in Sept 2022 for elevated liver enzymes and suspicion for alcoholic cirrhosis.  She underwent elastography Sept 28th which showed a mean kPa of 48.5, consistent with cirrhosis and trace perihepatic ascites.  Evaluation for other causes of chronic liver disease was negative, other than a mildly elevated anti-smooth muscle level (28).   She was scheduled for an EGD for variceal screening and her initial screening colonoscopy on Nov 3rd .  However, she was found to have an active upper GI bleed at the time of her EGD.  There appeared to be a bleeding lesion in the cardia which was treated with a single hemoclip, and then the patient was sent to the ED.  There she was found to have a hemoglobin of 7.9, INR 1.4, tbili 1.4.  BUN not elevated.  She underwent a repeat EGD the next day and did not have any active bleeding.  No varices were seen.  It was felt that her bleeding was most likely from a Mallory Weiss tear vs a Cameron's lesion. A small duodenal polyp was removed and found to be an adenoma.  She was discharged home and has been doing well since then. She denies any symptoms of recurrent GI bleeding.  Her energy level is back to baseline.  She has been having some hard stools and discomfort with the passage of stool, but denies bright red blood per rectum.  No lumps, bumps or protrusions appreciated by the patient.  She has been eating prunes daily which has helped with the hard stools.  Regarding alcohol use, the patient states that she has cut back significantly, but not completely stopped drinking alcohol.  She had champagne for New Year's.  She denies daily alcohol use.  She denies symptoms of abdominal or leg swelling.  No problems with difficulty thinking/concentrating, slowness of thought or speech.  She denies yellowing of her eyes.  Past Medical History:  Diagnosis Date   Arthritis    hip  and knees    Asthma    hx of  years ago    Atrial fibrillation ([place]) 11/2016   Blood transfusion    hx of years ago    Cholecystitis    Dysrhythmia    a fib   Hypertension      Past Surgical History:  Procedure Laterality Date   ANKLE SURGERY     BACK SURGERY     SPINAL FUSION   BIOPSY  06/30/2021   Procedure: BIOPSY;  Surgeon: Sharyn Creamer, MD;  Location: [facility] ENDOSCOPY;  Service: Gastroenterology;;   CESAREAN SECTION     cesearan     x4   CHOLECYSTECTOMY N/A 12/11/2013   Procedure: LAPAROSCOPIC CHOLECYSTECTOMY WITH INTRAOPERATIVE CHOLANGIOGRAM;  Surgeon: Imogene Burn. Georgette Dover, MD;  Location: [facility];  Service: General;  Laterality: N/A;   ESOPHAGOGASTRODUODENOSCOPY (EGD) WITH PROPOFOL N/A 06/30/2021   Procedure: ESOPHAGOGASTRODUODENOSCOPY (EGD) WITH PROPOFOL;  Surgeon: Sharyn Creamer, MD;  Location: [place];  Service: Gastroenterology;  Laterality: N/A;   LEFT HEART CATH AND CORONARY ANGIOGRAPHY N/A 11/29/2016   Procedure: Left Heart Cath and Coronary Angiography;  Surgeon: [name], MD;  Location: [place] [facility];  Service: Cardiovascular;  Laterality: N/A;   TOTAL HIP ARTHROPLASTY  10/22/2011   Procedure: TOTAL HIP ARTHROPLASTY;  Surgeon: Gearlean Alf, MD;  Location: [facility];  Service: Orthopedics;  Laterality: Right;   TOTAL HIP ARTHROPLASTY Left 12/05/2016   Procedure:  LEFT TOTAL HIP ARTHROPLASTY ANTERIOR APPROACH;  Surgeon: Gaynelle Arabian, MD;  Location: [facility];  Service: Orthopedics;  Laterality: Left;   Family History  Problem Relation Age of Onset   Diabetes Mother    Hypertension Father    Colon cancer Neg Hx    Esophageal cancer Neg Hx    Rectal cancer Neg Hx    Stomach cancer Neg Hx    Social History   Tobacco Use   Smoking status: Every Day    Packs/day: 0.50    Years: 10.00    Pack years: 5.00    Types: Cigarettes   Smokeless tobacco: Never  Vaping Use   Vaping Use: Never used  Substance Use Topics   Alcohol use: Yes    Alcohol/week: 5.0 standard drinks    Types: 1 Glasses of  wine, 4 Cans of beer per week    Comment: ocassionally   Drug use: No   Current Outpatient Medications  Medication Sig Dispense Refill   albuterol (VENTOLIN HFA) 108 (90 Base) MCG/ACT inhaler Inhale 2 puffs into the lungs every 6 (six) hours as needed for wheezing or shortness of breath. 18 g 2   docusate sodium (COLACE) 100 MG capsule Take 1 capsule (100 mg total) by mouth 2 (two) times daily as needed for mild constipation. 10 capsule 0   ferrous sulfate 325 (65 FE) MG tablet Take 1 tablet (325 mg total) by mouth 3 (three) times daily with meals. 90 tablet 3   pantoprazole (PROTONIX) 40 MG tablet Take 1 tablet (40 mg total) by mouth 2 (two) times daily. 60 tablet 6   tiZANidine (ZANAFLEX) 2 MG tablet Take 1 tablet (2 mg total) by mouth at bedtime as needed for muscle spasms. 30 tablet 1   Vitamin D, Ergocalciferol, (DRISDOL) 1.25 MG (50000 UNIT) CAPS capsule Take 50,000 Units by mouth once a week.     No current facility-administered medications for this visit.   Allergies  Allergen Reactions   Bee Venom Swelling and Other (See Comments)    Swelling wherever the sting is. Also gets dizzy   Lisinopril Swelling     Review of Systems: All systems reviewed and negative except where noted in HPI.    No results found.  Physical Exam: BP 128/68    Pulse 88    Ht 4' 11.5" (1.511 m)    Wt 169 lb 9.6 oz (76.9 kg)    BMI 33.68 kg/m  Constitutional: Pleasant,well-developed, African American female in no acute distress. HEENT: Normocephalic and atraumatic. Conjunctivae are normal. No scleral icterus. Neck supple.  Cardiovascular: Normal rate, regular rhythm.  Pulmonary/chest: Effort normal and breath sounds normal. No wheezing, rales or rhonchi. Abdominal: Soft, nondistended, nontender. Bowel sounds active throughout. There are no masses palpable. No hepatomegaly. Extremities: no edema Neurological: Alert and oriented to person place and time. Skin: Skin is warm and dry. No rashes  noted. Psychiatric: Normal mood and affect. Behavior is normal.  CBC    Component Value Date/Time   WBC 8.7 07/03/2021 0308   RBC 3.28 (L) 07/03/2021 0308   HGB 7.7 (L) 07/03/2021 0308   HCT 24.1 (L) 07/03/2021 0308   HCT 25.1 (L) 06/30/2021 1032   PLT 162 07/03/2021 0308   MCV 73.5 (L) 07/03/2021 0308   MCH 23.5 (L) 07/03/2021 0308   MCHC 32.0 07/03/2021 0308   RDW 16.4 (H) 07/03/2021 0308   LYMPHSABS 3.5 06/29/2021 1625   MONOABS 1.2 (H) 06/29/2021 1625   EOSABS 0.2 06/29/2021 1625  BASOSABS 0.1 06/29/2021 1625    CMP     Component Value Date/Time   NA 137 06/30/2021 0143   K 3.2 (L) 06/30/2021 0143   CL 108 06/30/2021 0143   CO2 23 06/30/2021 0143   GLUCOSE 128 (H) 06/30/2021 0143   BUN <5 (L) 06/30/2021 0143   CREATININE 0.55 06/30/2021 0143   CALCIUM 8.6 (L) 06/30/2021 0143   PROT 5.6 (L) 06/30/2021 0143   ALBUMIN 2.2 (L) 06/30/2021 0143   AST 42 (H) 06/30/2021 0143   ALT 15 06/30/2021 0143   ALKPHOS 65 06/30/2021 0143   BILITOT 1.4 (H) 06/30/2021 0143   GFRNONAA >60 06/30/2021 0143   GFRAA >60 01/10/2020 0856   CLINICAL DATA:  Cirrhosis   EXAM: US LIVER ELASTOGRAPHY   TECHNIQUE: Sonography of the liver was performed. In addition, ultrasound elastography evaluation of the liver was performed. A region of interest was placed within the right lobe of the liver. Following application of a compressive sonographic pulse, tissue compressibility was assessed. Multiple assessments were performed at the selected site. Median tissue compressibility was determined. Previously, hepatic stiffness was assessed by shear wave velocity. Based on recently published Society of Radiologists in Ultrasound consensus article, reporting is now recommended to be performed in the SI units of pressure (kiloPascals) representing hepatic stiffness/elasticity. The obtained result is compared to the published reference standards. (cACLD = compensated Advanced Chronic Liver  Disease)   COMPARISON:  CT 05/05/2021   FINDINGS: Liver: Diffusely increased echotexture. Subtle nodular contours of the liver surface. No focal hepatic abnormality. Perihepatic ascites noted. Portal vein is patent on color Doppler imaging with normal direction of blood flow towards the liver.   ULTRASOUND HEPATIC ELASTOGRAPHY   Device: Siemens Helix VTQ   Patient position: Supine   Transducer: 5C1   Number of measurements: 10   Hepatic segment:  8   Median kPa: 48.5   IQR: 8.6   IQR/Median kPa ratio: 0.2   Data quality:  Good   Diagnostic category: > or =17 kPa: highly suggestive of cACLD with an increased probability of clinically significant portal hypertension   The use of hepatic elastography is applicable to patients with viral hepatitis and non-alcoholic fatty liver disease. At this time, there is insufficient data for the referenced cut-off values and use in other causes of liver disease, including alcoholic liver disease. Patients, however, may be assessed by elastography and serve as their own reference standard/baseline.   In patients with non-alcoholic liver disease, the values suggesting compensated advanced chronic liver disease (cACLD) may be lower, and patients may need additional testing with elasticity results of 7-9 kPa.   Please note that abnormal hepatic elasticity and shear wave velocities may also be identified in clinical settings other than with hepatic fibrosis, such as: acute hepatitis, elevated right heart and central venous pressures including use of beta blockers, veno-occlusive disease (Budd-Chiari), infiltrative processes such as mastocytosis/amyloidosis/infiltrative tumor/lymphoma, extrahepatic cholestasis, with hyperemia in the post-prandial state, and with liver transplantation. Correlation with patient history, laboratory data, and clinical condition recommended.   Diagnostic Categories:   < or =5 kPa: high probability of  being normal   < or =9 kPa: in the absence of other known clinical signs, rules out cACLD   >9 kPa and ?13 kPa: suggestive of cACLD, but needs further testing   >13 kPa: highly suggestive of cACLD   > or =17 kPa: highly suggestive of cACLD with an increased probability of clinically significant portal hypertension   IMPRESSION: ULTRASOUND LIVER:  Increased echotexture with subtle nodular contours compatible with given history of cirrhosis. Perihepatic ascites.   ULTRASOUND HEPATIC ELASTOGRAPHY:   Median kPa:  48.5   Diagnostic category: > or =17 kPa: highly suggestive of cACLD with an increased probability of clinically significant portal hypertension     Electronically Signed   By: Rolm Baptise M.D.   On: 05/24/2021 14:47     ASSESSMENT AND PLAN: 61 year old female with recently diagnosed alcoholic cirrhosis and incidentally discovered active GI bleeding on variceal screening EGD, suspected either secondary to a Mallory Weiss tear or Cameron lesion.  She has not had any further evidence of recurrent bleeding since her EGD in early November.  She does continue to drink alcohol, albeit in lesser amounts.  We discussed how there is no safe amount of alcohol for her to consume, and that the only way to slow progression of her liver disease and reduce the risk of complications of cirrhosis.  She is aware that she will need surveillance for liver cancer with a ultrasound every 6 months.  We will plan to get repeat MELD labs.  She had small amounts of ascites noted on her ultrasound.  She does not have other evidence of portal hypertension (no varices, normal platelets, no splenomegaly).  I am not convinced she needs to be placed on diuretics or to be considered decompensated at this point. Regarding her upper GI bleed, this was not from varices or from a peptic ulcer.  She has been taking Protonix twice a day for nearly 2 months.  I think it is okay for her to stop Protonix at this  point.  We need to perform a repeat upper endoscopy to reassess the polypectomy site of the duodenal adenoma and ensure no residual polyp was left.  There is no reason to suspect that the patient would again have an upper GI bleed at the time of endoscopy, so I think it is safe to be done in the endoscopy center.  I would like to repeat her CBC to ensure that her hemoglobin is increasing since her hospitalization. We still need to complete her screening colonoscopy.  We will reattempt this at the same time as her repeat upper endoscopy.  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   Saveah Bahar E. Candis Schatz, MD [facility]   CC:  [name], MD

## 2021-08-30 LAB — PROTIME-INR
INR: 1.2 — ABNORMAL HIGH
Prothrombin Time: 12.2 s — ABNORMAL HIGH (ref 9.0–11.5)

## 2021-09-01 ENCOUNTER — Encounter: Payer: Self-pay | Admitting: Gastroenterology

## 2021-09-04 NOTE — Progress Notes (Signed)
[name],  Can you please let Ms. Marissa Weber know that her labs looked good.  Her hemoglobin was back to normal, but I would recommend she continue to take iron supplements for two months to help get her body's iron stores back to normal.

## 2021-09-26 ENCOUNTER — Ambulatory Visit (AMBULATORY_SURGERY_CENTER): Payer: Medicaid Other | Admitting: Gastroenterology

## 2021-09-26 ENCOUNTER — Other Ambulatory Visit: Payer: Self-pay

## 2021-09-26 ENCOUNTER — Encounter: Payer: Self-pay | Admitting: Gastroenterology

## 2021-09-26 VITALS — BP 151/100 | HR 89 | Temp 98.0°F | Resp 20 | Ht 59.0 in | Wt 169.0 lb

## 2021-09-26 DIAGNOSIS — Z1211 Encounter for screening for malignant neoplasm of colon: Secondary | ICD-10-CM

## 2021-09-26 DIAGNOSIS — K449 Diaphragmatic hernia without obstruction or gangrene: Secondary | ICD-10-CM | POA: Diagnosis not present

## 2021-09-26 DIAGNOSIS — Z8719 Personal history of other diseases of the digestive system: Secondary | ICD-10-CM | POA: Diagnosis not present

## 2021-09-26 DIAGNOSIS — D124 Benign neoplasm of descending colon: Secondary | ICD-10-CM | POA: Diagnosis not present

## 2021-09-26 DIAGNOSIS — D132 Benign neoplasm of duodenum: Secondary | ICD-10-CM

## 2021-09-26 MED ORDER — SODIUM CHLORIDE 0.9 % IV SOLN: 500.0000 mL | Freq: Once | INTRAVENOUS | Status: DC

## 2021-09-26 MED ORDER — PANTOPRAZOLE SODIUM 40 MG PO TBEC: 40.0000 mg | DELAYED_RELEASE_TABLET | Freq: Every day | ORAL | 0 refills | Status: DC

## 2021-09-26 NOTE — Op Note (Signed)
[place] Patient Name: Marissa Weber Procedure Date: 09/26/2021 4:42 PM MRN: [id number] Endoscopist: Nicki Reaper E. Candis Schatz , MD Age: 61 Referring MD:  Date of Birth: 06/02/1961 Gender: Female Account #: [id number] Procedure:                Upper GI endoscopy Indications:              Follow-up of polyps in the duodenum Medicines:                Monitored Anesthesia Care Procedure:                Pre-Anesthesia Assessment:                           - Prior to the procedure, a History and Physical                            was performed, and patient medications and                            allergies were reviewed. The patient's tolerance of                            previous anesthesia was also reviewed. The risks                            and benefits of the procedure and the sedation                            options and risks were discussed with the patient.                            All questions were answered, and informed consent                            was obtained. Prior Anticoagulants: The patient has                            taken no previous anticoagulant or antiplatelet                            agents. ASA Grade Assessment: III - A patient with                            severe systemic disease. After reviewing the risks                            and benefits, the patient was deemed in                            satisfactory condition to undergo the procedure.                           After obtaining informed consent, the endoscope was  passed under direct vision. Throughout the                            procedure, the patient's blood pressure, pulse, and                            oxygen saturations were monitored continuously. The                            GIF [id number] #[id number] was introduced through the                            mouth, and advanced to the third part of duodenum.                            The upper GI  endoscopy was accomplished without                            difficulty. The patient tolerated the procedure                            well. Scope In: Scope Out: Findings:                 The examined portions of the nasopharynx,                            oropharynx and larynx were normal.                           A single area of ectopic gastric mucosa was found                            in the upper third of the esophagus.                           The examined esophagus was normal.                           A 3-4 cm hiatal hernia was present.                           An endoclip was found in the cardia consistent with                            previous hemostatic intervention.                           Red blood with oozing was found in the cardia. This                            stopped spontaneously during the procedure. No                            active bleeding at time of completion of procedure.  The exam of the stomach was otherwise normal.                           The examined duodenum was normal. No evidence of                            remnant polyp Complications:            No immediate complications. Estimated Blood Loss:     Estimated blood loss was minimal. Impression:               - The examined portions of the nasopharynx,                            oropharynx and larynx were normal.                           - Ectopic gastric mucosa in the upper third of the                            esophagus.                           - Normal esophagus.                           - 3 cm hiatal hernia.                           - An endoclip was found in the stomach.                           - Red blood with oozing in the cardia. This                            appeared to be secondary to mucosal irritation from                            the endoclip                           - Normal examined duodenum.                           - No  specimens collected. Recommendation:           - Patient has a contact number available for                            emergencies. The signs and symptoms of potential                            delayed complications were discussed with the                            patient. Return to normal activities tomorrow.  Written discharge instructions were provided to the                            patient.                           - Resume previous diet.                           - Use Protonix (pantoprazole) 40 mg PO daily for 8                            weeks given likelihood of intermittent oozing from                            hemoclip in cardia.                           - Repeat upper endoscopy in 1 year for surveillance                            of duodenal adenoma and variceal screening. Tais Koestner E. Candis Schatz, MD 09/26/2021 5:28:51 PM This report has been signed electronically.

## 2021-09-26 NOTE — Op Note (Signed)
[place] Patient Name: Marissa Weber Procedure Date: 09/26/2021 4:34 PM MRN: [id number] Endoscopist: Nicki Reaper E. Candis Schatz , MD Age: 61 Referring MD:  Date of Birth: [birth year] Gender: Female Account #: [id number] Procedure:                Colonoscopy Indications:              Screening for colorectal malignant neoplasm, This                            is the patient's first colonoscopy Medicines:                Monitored Anesthesia Care Procedure:                Pre-Anesthesia Assessment:                           - Prior to the procedure, a History and Physical                            was performed, and patient medications and                            allergies were reviewed. The patient's tolerance of                            previous anesthesia was also reviewed. The risks                            and benefits of the procedure and the sedation                            options and risks were discussed with the patient.                            All questions were answered, and informed consent                            was obtained. Prior Anticoagulants: The patient has                            taken no previous anticoagulant or antiplatelet                            agents. ASA Grade Assessment: III - A patient with                            severe systemic disease. After reviewing the risks                            and benefits, the patient was deemed in                            satisfactory condition to undergo the procedure.  After obtaining informed consent, the colonoscope                            was passed under direct vision. Throughout the                            procedure, the patient's blood pressure, pulse, and                            oxygen saturations were monitored continuously. The                            CF [id number] #[id number] was introduced through the anus                            and advanced to  the the terminal ileum, with                            identification of the appendiceal orifice and IC                            valve. The colonoscopy was performed without                            difficulty. The patient tolerated the procedure                            well. The quality of the bowel preparation was                            adequate. The terminal ileum, ileocecal valve,                            appendiceal orifice, and rectum were photographed. Scope In: 5:01:47 PM Scope Out: 5:18:53 PM Scope Withdrawal Time: 0 hours 15 minutes 7 seconds  Total Procedure Duration: 0 hours 17 minutes 6 seconds  Findings:                 The perianal and digital rectal examinations were                            normal. Pertinent negatives include normal                            sphincter tone and no palpable rectal lesions.                           A 3 mm polyp was found in the descending colon. The                            polyp was sessile. The polyp was removed with a                            cold snare. Resection  and retrieval were complete.                            Estimated blood loss was minimal.                           A few small-mouthed diverticula were found in the                            sigmoid colon and descending colon.                           The exam was otherwise normal throughout the                            examined colon.                           The terminal ileum appeared normal.                           Non-bleeding internal hemorrhoids were found during                            retroflexion. The hemorrhoids were Grade I                            (internal hemorrhoids that do not prolapse).                           No additional abnormalities were found on                            retroflexion. Complications:            No immediate complications. Estimated Blood Loss:     Estimated blood loss was minimal. Impression:                - One 3 mm polyp in the descending colon, removed                            with a cold snare. Resected and retrieved.                           - Diverticulosis in the sigmoid colon and in the                            descending colon.                           - The examined portion of the ileum was normal.                           - Non-bleeding internal hemorrhoids. Recommendation:           - Patient has a contact number available for  emergencies. The signs and symptoms of potential                            delayed complications were discussed with the                            patient. Return to normal activities tomorrow.                            Written discharge instructions were provided to the                            patient.                           - Resume previous diet.                           - Continue present medications.                           - Await pathology results.                           - Repeat colonoscopy (date not yet determined) for                            surveillance based on pathology results. Ejay Lashley E. Candis Schatz, MD 09/26/2021 5:32:42 PM This report has been signed electronically.

## 2021-09-26 NOTE — Patient Instructions (Signed)
Take your medicine as directed.   It is at your pharmacy at this time.  YOU HAD AN ENDOSCOPIC PROCEDURE TODAY AT [facility]:   Refer to the procedure report that was given to you for any specific questions about what was found during the examination.  If the procedure report does not answer your questions, please call your gastroenterologist to clarify.  If you requested that your care partner not be given the details of your procedure findings, then the procedure report has been included in a sealed envelope for you to review at your convenience later.  YOU SHOULD EXPECT: Some feelings of bloating in the abdomen. Passage of more gas than usual.  Walking can help get rid of the air that was put into your GI tract during the procedure and reduce the bloating. If you had a lower endoscopy (such as a colonoscopy or flexible sigmoidoscopy) you may notice spotting of blood in your stool or on the toilet paper. If you underwent a bowel prep for your procedure, you may not have a normal bowel movement for a few days.  Please Note:  You might notice some irritation and congestion in your nose or some drainage.  This is from the oxygen used during your procedure.  There is no need for concern and it should clear up in a day or so.  SYMPTOMS TO REPORT IMMEDIATELY:  Following lower endoscopy (colonoscopy or flexible sigmoidoscopy):  Excessive amounts of blood in the stool  Significant tenderness or worsening of abdominal pains  Swelling of the abdomen that is new, acute  Fever of 100F or higher  Following upper endoscopy (EGD)  Vomiting of blood or coffee ground material  New chest pain or pain under the shoulder blades  Painful or persistently difficult swallowing  New shortness of breath  Fever of 100F or higher  Black, tarry-looking stools  For urgent or emergent issues, a gastroenterologist can be reached at any hour by calling [phone]. Do not use MyChart messaging for  urgent concerns.    DIET:  We do recommend a small meal at first, but then you may proceed to your regular diet.  Drink plenty of fluids but you should avoid alcoholic beverages for 24 hours.  ACTIVITY:  You should plan to take it easy for the rest of today and you should NOT DRIVE or use heavy machinery until tomorrow (because of the sedation medicines used during the test).    FOLLOW UP: Our staff will call the number listed on your records 48-72 hours following your procedure to check on you and address any questions or concerns that you may have regarding the information given to you following your procedure. If we do not reach you, we will leave a message.  We will attempt to reach you two times.  During this call, we will ask if you have developed any symptoms of COVID 19. If you develop any symptoms (ie: fever, flu-like symptoms, shortness of breath, cough etc.) before then, please call [phone].  If you test positive for Covid 19 in the 2 weeks post procedure, please call and report this information to [place].    If any biopsies were taken you will be contacted by phone or by letter within the next 1-3 weeks.  Please call us at [phone] if you have not heard about the biopsies in 3 weeks.    SIGNATURES/CONFIDENTIALITY: You and/or your care partner have signed paperwork which will be entered into your electronic medical  record.  These signatures attest to the fact that that the information above on your After Visit Summary has been reviewed and is understood.  Full responsibility of the confidentiality of this discharge information lies with you and/or your care-partner.

## 2021-09-26 NOTE — Progress Notes (Signed)
Called to room to assist during endoscopic procedure.  Patient ID and intended procedure confirmed with present staff. Received instructions for my participation in the procedure from the performing physician.  

## 2021-09-26 NOTE — Progress Notes (Signed)
Pt's states no medical or surgical changes since previsit or office visit.  ° °VS DT °

## 2021-09-26 NOTE — Progress Notes (Signed)
History and Physical Interval Note:  09/26/2021 4:39 PM  [place]  has presented today for endoscopic procedure(s), with the diagnosis of  Encounter Diagnosis  Name Primary?   Duodenal adenoma Yes  .  The various methods of evaluation and treatment have been discussed with the patient and/or family. After consideration of risks, benefits and other options for treatment, the patient has consented to  the endoscopic procedure(s).   The patient's history has been reviewed, patient examined, no change in status, stable for endoscopic procedure(s).  I have reviewed the patient's chart and labs.  Questions were answered to the patient's satisfaction.    There have been no changes in the patient's symptoms or medical history since her clinic visit Jan 3rd  Linas Stepter E. Candis Schatz, MD [facility]

## 2021-09-26 NOTE — Progress Notes (Signed)
Pt in recovery with monitors in place, VSS. Report given to receiving RN. Bite guard was placed with pt awake to ensure comfort. No dental or soft tissue damage noted. 

## 2021-09-28 ENCOUNTER — Telehealth: Payer: Self-pay

## 2021-09-28 NOTE — Telephone Encounter (Signed)
°  Follow up Call-  Call back number 09/26/2021 06/29/2021  Post procedure Call Back phone  # [phone]  Permission to leave phone message Yes Yes  Some recent data might be hidden     Patient questions:  Do you have a fever, pain , or abdominal swelling? No. Pain Score  0 *  Have you tolerated food without any problems? No.  Have you been able to return to your normal activities? Yes.    Do you have any questions about your discharge instructions: Diet   No. Medications  No. Follow up visit  No.  Do you have questions or concerns about your Care? No.  Actions: * If pain score is 4 or above: No action needed, pain <4.

## 2021-09-28 NOTE — Telephone Encounter (Signed)
Called [phone] and  was unable to leave a message that we tried to reach pt for a follow up call. The mailbox was full. maw

## 2021-10-05 ENCOUNTER — Encounter: Payer: Self-pay | Admitting: Gastroenterology

## 2021-11-22 ENCOUNTER — Other Ambulatory Visit: Payer: Self-pay | Admitting: Gastroenterology

## 2021-11-22 DIAGNOSIS — D132 Benign neoplasm of duodenum: Secondary | ICD-10-CM

## 2021-11-22 DIAGNOSIS — K449 Diaphragmatic hernia without obstruction or gangrene: Secondary | ICD-10-CM

## 2022-01-26 ENCOUNTER — Other Ambulatory Visit: Payer: Self-pay | Admitting: Gastroenterology

## 2022-04-24 ENCOUNTER — Other Ambulatory Visit: Payer: Self-pay | Admitting: Gastroenterology

## 2022-07-05 ENCOUNTER — Other Ambulatory Visit: Payer: Self-pay | Admitting: Gastroenterology

## 2022-09-02 ENCOUNTER — Other Ambulatory Visit: Payer: Self-pay | Admitting: Gastroenterology

## 2022-10-24 ENCOUNTER — Encounter: Payer: Self-pay | Admitting: Gastroenterology

## 2022-11-25 ENCOUNTER — Other Ambulatory Visit: Payer: Self-pay | Admitting: Gastroenterology

## 2022-11-27 NOTE — Telephone Encounter (Signed)
Pharmacy was requesting refill for Zanaflex. Patient's daughter Marissa Weber made aware of Dr. Dayle Points recommendation as below.  [name], MD  to Me  [facility]   11/27/22  1:32 PM No, I would prefer not.  I refilled it one time at a clinic visit at her request, but I do not wish to be the primary prescriber for that medication.  She should ask her PCP or whoever originally prescribed it.  Thank you. November 26, 2022   She verbalized understanding.

## 2022-12-28 ENCOUNTER — Other Ambulatory Visit: Payer: Self-pay | Admitting: Gastroenterology

## 2023-04-25 ENCOUNTER — Encounter (HOSPITAL_COMMUNITY): Payer: Self-pay

## 2023-04-25 ENCOUNTER — Ambulatory Visit (HOSPITAL_COMMUNITY)
Admission: EM | Admit: 2023-04-25 | Discharge: 2023-04-25 | Disposition: A | Discharge status: Home / Self Care | Payer: Medicaid Other | Attending: Family Medicine | Admitting: Family Medicine

## 2023-04-25 DIAGNOSIS — J4521 Mild intermittent asthma with (acute) exacerbation: Secondary | ICD-10-CM | POA: Diagnosis not present

## 2023-04-25 DIAGNOSIS — J069 Acute upper respiratory infection, unspecified: Secondary | ICD-10-CM | POA: Insufficient documentation

## 2023-04-25 DIAGNOSIS — Z20822 Contact with and (suspected) exposure to covid-19: Secondary | ICD-10-CM

## 2023-04-25 LAB — SARS CORONAVIRUS 2 (TAT 6-24 HRS): SARS Coronavirus 2: NEGATIVE

## 2023-04-25 MED ORDER — PREDNISONE 20 MG PO TABS: 40.0000 mg | ORAL_TABLET | Freq: Every day | ORAL | 0 refills | Status: DC

## 2023-04-25 MED ORDER — PROMETHAZINE-DM 6.25-15 MG/5ML PO SYRP: 5.0000 mL | ORAL_SOLUTION | Freq: Four times a day (QID) | ORAL | 0 refills | Status: DC | PRN

## 2023-04-25 NOTE — Discharge Instructions (Addendum)
You have been tested for COVID-19 today. °If your test returns positive, you will receive a phone call from [facility] regarding your results. °Negative test results are not called. °Both positive and negative results area always visible on MyChart. °If you do not have a MyChart account, sign up instructions are provided in your discharge papers. °Please do not hesitate to contact us should you have questions or concerns. ° °

## 2023-04-25 NOTE — ED Triage Notes (Signed)
Patient having headache and cough. Onset of symptoms yesterday.  Patient has history of smoking. States her grand-daughter tested positive for COVID. States they live in the same home.   Patient has not taken any otc meds for her symptoms.

## 2023-04-25 NOTE — ED Provider Notes (Signed)
[facility]   [id number] 04/25/23 Arrival Time: 1010  ASSESSMENT & PLAN:  1. Viral URI with cough   2. Exposure to COVID-19 virus   3. Mild intermittent asthma with acute exacerbation    Without respiratory distress. Discussed typical duration of likely viral illness. COVID testing sent upon pt request. OTC symptom care as needed.  Discharge Medication List as of 04/25/2023 10:55 AM     START taking these medications   Details  predniSONE (DELTASONE) 20 MG tablet Take 2 tablets (40 mg total) by mouth daily., Starting Thu 04/25/2023, Normal    promethazine-dextromethorphan (PROMETHAZINE-DM) 6.25-15 MG/5ML syrup Take 5 mLs by mouth 4 (four) times daily as needed for cough., Starting Thu 04/25/2023, Normal         Follow-up Information     Orpah Cobb, MD.   Specialty: Cardiology Why: As needed. Contact information: [address] [place] [address] [phone]         [facility].   Specialty: Urgent Care Why: If worsening or failing to improve as anticipated. Contact information: [address] [place] [address] [phone]                Reviewed expectations re: course of current medical issues. Questions answered. Outlined signs and symptoms indicating need for more acute intervention. Understanding verbalized. After Visit Summary given.   SUBJECTIVE: History from: Patient. Marissa Weber is a 62 y.o. female. Patient having headache and cough. Onset of symptoms yesterday.  Patient has history of smoking. States her grand-daughter tested positive for COVID. States they live in the same home. Is wheezing. Denies fever. Normal PO intake without n/v/d.  OBJECTIVE:  Vitals:   04/25/23 1032 04/25/23 1033  BP:  124/85  Pulse:  74  Resp:  18  Temp:  98.4 F (36.9 C)  TempSrc:  Oral  SpO2:  96%  Weight: 71.7 kg   Height: 4' 10.5" (1.486 m)     General appearance: alert; no  distress Eyes: PERRLA; EOMI; conjunctiva normal HENT: [place]; AT; with nasal congestion Neck: supple  Lungs: speaks full sentences without difficulty; unlabored; bilat exp wheezing Extremities: no edema Skin: warm and dry Neurologic: normal gait Psychological: alert and cooperative; normal mood and affect  Labs:  Labs Reviewed  SARS CORONAVIRUS 2 (TAT 6-24 HRS)    Allergies  Allergen Reactions   Bee Venom Swelling and Other (See Comments)    Swelling wherever the sting is. Also gets dizzy   Lisinopril Swelling    Past Medical History:  Diagnosis Date   Arthritis    hip  and knees    Asthma    hx of years ago    Atrial fibrillation (HCC) 11/2016   Blood transfusion    hx of years ago    Cholecystitis    Dysrhythmia    a fib   Hypertension    Social History   Socioeconomic History   Marital status: Single    Spouse name: Not on file   Number of children: 5   Years of education: Not on file   Highest education level: Not on file  Occupational History   Not on file  Tobacco Use   Smoking status: Every Day    Current packs/day: 0.50    Average packs/day: 0.5 packs/day for 10.0 years (5.0 ttl pk-yrs)    Types: Cigarettes   Smokeless tobacco: Never  Vaping Use   Vaping status: Never Used  Substance and Sexual  Activity   Alcohol use: Yes    Alcohol/week: 5.0 standard drinks of alcohol    Types: 1 Glasses of wine, 4 Cans of beer per week    Comment: ocassionally   Drug use: No   Sexual activity: Yes  Other Topics Concern   Not on file  Social History Narrative   Not on file   Social Determinants of Health   Financial Resource Strain: Not on file  Food Insecurity: Not on file  Transportation Needs: Not on file  Physical Activity: Not on file  Stress: Not on file  Social Connections: Not on file  Intimate Partner Violence: Not on file   Family History  Problem Relation Age of Onset   Diabetes Mother    Hypertension Father    Colon cancer Neg Hx     Esophageal cancer Neg Hx    Rectal cancer Neg Hx    Stomach cancer Neg Hx    Past Surgical History:  Procedure Laterality Date   ANKLE SURGERY     BACK SURGERY     SPINAL FUSION   BIOPSY  06/30/2021   Procedure: BIOPSY;  Surgeon: Imogene Burn, MD;  Location: [facility];  Service: Gastroenterology;;   CESAREAN SECTION     cesearan     x4   CHOLECYSTECTOMY N/A 12/11/2013   Procedure: LAPAROSCOPIC CHOLECYSTECTOMY WITH INTRAOPERATIVE CHOLANGIOGRAM;  Surgeon: Wilmon Arms. Corliss Skains, MD;  Location: [facility] OR;  Service: General;  Laterality: N/A;   ESOPHAGOGASTRODUODENOSCOPY (EGD) WITH PROPOFOL N/A 06/30/2021   Procedure: ESOPHAGOGASTRODUODENOSCOPY (EGD) WITH PROPOFOL;  Surgeon: Imogene Burn, MD;  Location: [facility];  Service: Gastroenterology;  Laterality: N/A;   LEFT HEART CATH AND CORONARY ANGIOGRAPHY N/A 11/29/2016   Procedure: Left Heart Cath and Coronary Angiography;  Surgeon: Orpah Cobb, MD;  Location: [facility]VAS[facility];  Service: Cardiovascular;  Laterality: N/A;   TOTAL HIP ARTHROPLASTY  10/22/2011   Procedure: TOTAL HIP ARTHROPLASTY;  Surgeon: Loanne Drilling, MD;  Location: [facility];  Service: Orthopedics;  Laterality: Right;   TOTAL HIP ARTHROPLASTY Left 12/05/2016   Procedure: LEFT TOTAL HIP ARTHROPLASTY ANTERIOR APPROACH;  Surgeon: Ollen Gross, MD;  Location: [facility];  Service: Orthopedics;  Laterality: Left;     Mardella Layman, MD 04/25/23 1306

## 2023-04-26 ENCOUNTER — Telehealth (HOSPITAL_COMMUNITY): Payer: Self-pay | Admitting: Emergency Medicine

## 2023-04-26 NOTE — Telephone Encounter (Signed)
Patient left voicemail requesting results review of recent COVID test.  Reviewed with patient, she verbalized understanding

## 2023-06-18 ENCOUNTER — Observation Stay (HOSPITAL_BASED_OUTPATIENT_CLINIC_OR_DEPARTMENT_OTHER)
Admission: EM | Admit: 2023-06-18 | Discharge: 2023-06-21 | Disposition: A | Discharge status: Home / Self Care | Payer: Medicaid Other | Attending: Family Medicine | Admitting: Family Medicine

## 2023-06-18 ENCOUNTER — Encounter (HOSPITAL_BASED_OUTPATIENT_CLINIC_OR_DEPARTMENT_OTHER): Payer: Self-pay

## 2023-06-18 ENCOUNTER — Emergency Department (HOSPITAL_BASED_OUTPATIENT_CLINIC_OR_DEPARTMENT_OTHER): Payer: Medicaid Other

## 2023-06-18 ENCOUNTER — Other Ambulatory Visit: Payer: Self-pay

## 2023-06-18 DIAGNOSIS — Z79899 Other long term (current) drug therapy: Secondary | ICD-10-CM

## 2023-06-18 DIAGNOSIS — Z96643 Presence of artificial hip joint, bilateral: Secondary | ICD-10-CM | POA: Diagnosis not present

## 2023-06-18 DIAGNOSIS — I10 Essential (primary) hypertension: Secondary | ICD-10-CM | POA: Diagnosis not present

## 2023-06-18 DIAGNOSIS — R350 Frequency of micturition: Secondary | ICD-10-CM | POA: Diagnosis present

## 2023-06-18 DIAGNOSIS — E871 Hypo-osmolality and hyponatremia: Secondary | ICD-10-CM | POA: Diagnosis not present

## 2023-06-18 DIAGNOSIS — F17210 Nicotine dependence, cigarettes, uncomplicated: Secondary | ICD-10-CM | POA: Diagnosis present

## 2023-06-18 DIAGNOSIS — Z7982 Long term (current) use of aspirin: Secondary | ICD-10-CM | POA: Diagnosis not present

## 2023-06-18 DIAGNOSIS — J069 Acute upper respiratory infection, unspecified: Secondary | ICD-10-CM | POA: Diagnosis not present

## 2023-06-18 DIAGNOSIS — R739 Hyperglycemia, unspecified: Principal | ICD-10-CM

## 2023-06-18 DIAGNOSIS — Z1152 Encounter for screening for COVID-19: Secondary | ICD-10-CM

## 2023-06-18 DIAGNOSIS — Z9049 Acquired absence of other specified parts of digestive tract: Secondary | ICD-10-CM

## 2023-06-18 DIAGNOSIS — Z8249 Family history of ischemic heart disease and other diseases of the circulatory system: Secondary | ICD-10-CM | POA: Diagnosis not present

## 2023-06-18 DIAGNOSIS — Z91030 Bee allergy status: Secondary | ICD-10-CM

## 2023-06-18 DIAGNOSIS — Z833 Family history of diabetes mellitus: Secondary | ICD-10-CM | POA: Diagnosis not present

## 2023-06-18 DIAGNOSIS — Z981 Arthrodesis status: Secondary | ICD-10-CM

## 2023-06-18 DIAGNOSIS — Z7952 Long term (current) use of systemic steroids: Secondary | ICD-10-CM | POA: Insufficient documentation

## 2023-06-18 DIAGNOSIS — E1100 Type 2 diabetes mellitus with hyperosmolarity without nonketotic hyperglycemic-hyperosmolar coma (NKHHC): Principal | ICD-10-CM | POA: Diagnosis present

## 2023-06-18 DIAGNOSIS — Z7901 Long term (current) use of anticoagulants: Secondary | ICD-10-CM | POA: Diagnosis not present

## 2023-06-18 DIAGNOSIS — I4891 Unspecified atrial fibrillation: Secondary | ICD-10-CM | POA: Diagnosis present

## 2023-06-18 DIAGNOSIS — R059 Cough, unspecified: Secondary | ICD-10-CM | POA: Diagnosis not present

## 2023-06-18 DIAGNOSIS — Z888 Allergy status to other drugs, medicaments and biological substances status: Secondary | ICD-10-CM | POA: Diagnosis not present

## 2023-06-18 DIAGNOSIS — R0602 Shortness of breath: Secondary | ICD-10-CM | POA: Diagnosis not present

## 2023-06-18 DIAGNOSIS — Z794 Long term (current) use of insulin: Secondary | ICD-10-CM | POA: Diagnosis not present

## 2023-06-18 DIAGNOSIS — E861 Hypovolemia: Secondary | ICD-10-CM | POA: Diagnosis not present

## 2023-06-18 LAB — I-STAT VENOUS BLOOD GAS, ED
Acid-base deficit: 1 mmol/L (ref 0.0–2.0)
Bicarbonate: 23.7 mmol/L (ref 20.0–28.0)
Calcium, Ion: 1.23 mmol/L (ref 1.15–1.40)
HCT: 42 % (ref 36.0–46.0)
Hemoglobin: 14.3 g/dL (ref 12.0–15.0)
O2 Saturation: 95 %
Potassium: 3.8 mmol/L (ref 3.5–5.1)
Sodium: 119 mmol/L — CL (ref 135–145)
TCO2: 25 mmol/L (ref 22–32)
pCO2, Ven: 36.9 mm[Hg] — ABNORMAL LOW (ref 44–60)
pH, Ven: 7.416 (ref 7.25–7.43)
pO2, Ven: 72 mm[Hg] — ABNORMAL HIGH (ref 32–45)

## 2023-06-18 LAB — HEPATIC FUNCTION PANEL
ALT: 8 U/L (ref 0–44)
AST: 14 U/L — ABNORMAL LOW (ref 15–41)
Albumin: 3.7 g/dL (ref 3.5–5.0)
Alkaline Phosphatase: 177 U/L — ABNORMAL HIGH (ref 38–126)
Bilirubin, Direct: 0.2 mg/dL (ref 0.0–0.2)
Indirect Bilirubin: 0.4 mg/dL (ref 0.3–0.9)
Total Bilirubin: 0.6 mg/dL (ref 0.3–1.2)
Total Protein: 7.6 g/dL (ref 6.5–8.1)

## 2023-06-18 LAB — CBG MONITORING, ED
Glucose-Capillary: >600 mg/dL (ref 70–99)
Glucose-Capillary: >600 mg/dL (ref 70–99)
Glucose-Capillary: >600 mg/dL (ref 70–99)
Glucose-Capillary: 414 mg/dL — ABNORMAL HIGH (ref 70–99)
Glucose-Capillary: 514 mg/dL (ref 70–99)
Glucose-Capillary: 295 mg/dL — ABNORMAL HIGH (ref 70–99)
Glucose-Capillary: 384 mg/dL — ABNORMAL HIGH (ref 70–99)
Glucose-Capillary: 353 mg/dL — ABNORMAL HIGH (ref 70–99)

## 2023-06-18 LAB — MRSA NEXT GEN BY PCR, NASAL: MRSA by PCR Next Gen: NOT DETECTED

## 2023-06-18 LAB — BASIC METABOLIC PANEL
Anion gap: 14 (ref 5–15)
Anion gap: 14 (ref 5–15)
Anion gap: 13 (ref 5–15)
BUN: 7 mg/dL — ABNORMAL LOW (ref 8–23)
BUN: 9 mg/dL (ref 8–23)
BUN: 12 mg/dL (ref 8–23)
CO2: 24 mmol/L (ref 22–32)
CO2: 23 mmol/L (ref 22–32)
CO2: 25 mmol/L (ref 22–32)
Calcium: 10.7 mg/dL — ABNORMAL HIGH (ref 8.9–10.3)
Calcium: 10.2 mg/dL (ref 8.9–10.3)
Calcium: 10.5 mg/dL — ABNORMAL HIGH (ref 8.9–10.3)
Chloride: 79 mmol/L — ABNORMAL LOW (ref 98–111)
Chloride: 81 mmol/L — ABNORMAL LOW (ref 98–111)
Chloride: 91 mmol/L — ABNORMAL LOW (ref 98–111)
Creatinine, Ser: 0.92 mg/dL (ref 0.44–1.00)
Creatinine, Ser: 0.88 mg/dL (ref 0.44–1.00)
Creatinine, Ser: 0.96 mg/dL (ref 0.44–1.00)
GFR, Estimated: >60 mL/min (ref >60)
GFR, Estimated: >60 mL/min (ref >60)
GFR, Estimated: >60 mL/min (ref >60)
Glucose, Bld: 1072 mg/dL (ref 70–99)
Glucose, Bld: 945 mg/dL (ref 70–99)
Glucose, Bld: 361 mg/dL — ABNORMAL HIGH (ref 70–99)
Potassium: 4.3 mmol/L (ref 3.5–5.1)
Potassium: 3.7 mmol/L (ref 3.5–5.1)
Potassium: 3.7 mmol/L (ref 3.5–5.1)
Sodium: 117 mmol/L — CL (ref 135–145)
Sodium: 118 mmol/L — CL (ref 135–145)
Sodium: 129 mmol/L — ABNORMAL LOW (ref 135–145)

## 2023-06-18 LAB — GLUCOSE, CAPILLARY
Glucose-Capillary: 319 mg/dL — ABNORMAL HIGH (ref 70–99)
Glucose-Capillary: 312 mg/dL — ABNORMAL HIGH (ref 70–99)
Glucose-Capillary: 270 mg/dL — ABNORMAL HIGH (ref 70–99)
Glucose-Capillary: 239 mg/dL — ABNORMAL HIGH (ref 70–99)
Glucose-Capillary: 226 mg/dL — ABNORMAL HIGH (ref 70–99)
Glucose-Capillary: 222 mg/dL — ABNORMAL HIGH (ref 70–99)
Glucose-Capillary: 230 mg/dL — ABNORMAL HIGH (ref 70–99)
Glucose-Capillary: 200 mg/dL — ABNORMAL HIGH (ref 70–99)

## 2023-06-18 LAB — URINALYSIS, ROUTINE W REFLEX MICROSCOPIC
Bacteria, UA: NONE SEEN
Bilirubin Urine: NEGATIVE
Glucose, UA: >1000 mg/dL — AB
Hgb urine dipstick: NEGATIVE
Ketones, ur: 15 mg/dL — AB
Leukocytes,Ua: NEGATIVE
Nitrite: NEGATIVE
Protein, ur: NEGATIVE mg/dL
Specific Gravity, Urine: 1.027 (ref 1.005–1.030)
pH: 6.5 (ref 5.0–8.0)

## 2023-06-18 LAB — TROPONIN I (HIGH SENSITIVITY): Troponin I (High Sensitivity): 7 ng/L (ref <18)

## 2023-06-18 LAB — CBC WITH DIFFERENTIAL/PLATELET
Abs Immature Granulocytes: 0.03 10*3/uL (ref 0.00–0.07)
Basophils Absolute: 0.1 10*3/uL (ref 0.0–0.1)
Basophils Relative: 1 %
Eosinophils Absolute: 0.2 10*3/uL (ref 0.0–0.5)
Eosinophils Relative: 3 %
HCT: 37.4 % (ref 36.0–46.0)
Hemoglobin: 12.2 g/dL (ref 12.0–15.0)
Immature Granulocytes: 0 %
Lymphocytes Relative: 28 %
Lymphs Abs: 1.9 10*3/uL (ref 0.7–4.0)
MCH: 24 pg — ABNORMAL LOW (ref 26.0–34.0)
MCHC: 32.6 g/dL (ref 30.0–36.0)
MCV: 73.5 fL — ABNORMAL LOW (ref 80.0–100.0)
Monocytes Absolute: 0.7 10*3/uL (ref 0.1–1.0)
Monocytes Relative: 11 %
Neutro Abs: 4 10*3/uL (ref 1.7–7.7)
Neutrophils Relative %: 57 %
Platelets: 215 10*3/uL (ref 150–400)
RBC: 5.09 MIL/uL (ref 3.87–5.11)
RDW: 13.7 % (ref 11.5–15.5)
WBC: 6.9 10*3/uL (ref 4.0–10.5)
nRBC: 0 % (ref 0.0–0.2)

## 2023-06-18 LAB — RESP PANEL BY RT-PCR (RSV, FLU A&B, COVID)  RVPGX2
Influenza A by PCR: NEGATIVE
Influenza B by PCR: NEGATIVE
Resp Syncytial Virus by PCR: NEGATIVE
SARS Coronavirus 2 by RT PCR: NEGATIVE

## 2023-06-18 LAB — HIV ANTIBODY (ROUTINE TESTING W REFLEX): HIV Screen 4th Generation wRfx: NONREACTIVE

## 2023-06-18 MED ORDER — BENZONATATE 100 MG PO CAPS
100.0000 mg | ORAL_CAPSULE | Freq: Three times a day (TID) | ORAL | Status: DC | PRN
  Administered 2023-06-18 – 2023-06-20 (×4): 100 mg via ORAL
  Filled 2023-06-18 (×5): qty 1

## 2023-06-18 MED ORDER — INSULIN ASPART 100 UNIT/ML IJ SOLN
0.0000 [IU] | Freq: Every day | INTRAMUSCULAR | Status: DC
  Administered 2023-06-20: 3 [IU] via SUBCUTANEOUS

## 2023-06-18 MED ORDER — CYCLOBENZAPRINE HCL 5 MG PO TABS
5.0000 mg | ORAL_TABLET | Freq: Three times a day (TID) | ORAL | Status: DC | PRN
  Administered 2023-06-18: 5 mg via ORAL
  Filled 2023-06-18: qty 1

## 2023-06-18 MED ORDER — ENOXAPARIN SODIUM 40 MG/0.4ML IJ SOSY
40.0000 mg | PREFILLED_SYRINGE | INTRAMUSCULAR | Status: DC
  Administered 2023-06-18 – 2023-06-20 (×3): 40 mg via SUBCUTANEOUS
  Filled 2023-06-18 (×3): qty 0.4

## 2023-06-18 MED ORDER — ASPIRIN 81 MG PO TBEC
81.0000 mg | DELAYED_RELEASE_TABLET | Freq: Every day | ORAL | Status: DC
  Administered 2023-06-19 – 2023-06-21 (×3): 81 mg via ORAL
  Filled 2023-06-18 (×3): qty 1

## 2023-06-18 MED ORDER — TIZANIDINE HCL 4 MG PO TABS
2.0000 mg | ORAL_TABLET | Freq: Every day | ORAL | Status: DC
  Administered 2023-06-18 – 2023-06-20 (×3): 2 mg via ORAL
  Filled 2023-06-18 (×3): qty 1

## 2023-06-18 MED ORDER — LACTATED RINGERS IV SOLN: INTRAVENOUS | Status: DC

## 2023-06-18 MED ORDER — INSULIN ASPART 100 UNIT/ML IJ SOLN
0.0000 [IU] | Freq: Three times a day (TID) | INTRAMUSCULAR | Status: DC
  Administered 2023-06-19: 15 [IU] via SUBCUTANEOUS
  Administered 2023-06-19: 8 [IU] via SUBCUTANEOUS
  Administered 2023-06-19 – 2023-06-20 (×3): 15 [IU] via SUBCUTANEOUS
  Administered 2023-06-20: 3 [IU] via SUBCUTANEOUS
  Administered 2023-06-21 (×2): 15 [IU] via SUBCUTANEOUS

## 2023-06-18 MED ORDER — ONDANSETRON HCL 4 MG/2ML IJ SOLN: 4.0000 mg | Freq: Four times a day (QID) | INTRAMUSCULAR | Status: DC | PRN

## 2023-06-18 MED ORDER — INSULIN GLARGINE-YFGN 100 UNIT/ML ~~LOC~~ SOLN
10.0000 [IU] | Freq: Two times a day (BID) | SUBCUTANEOUS | Status: DC
  Administered 2023-06-18 – 2023-06-19 (×2): 10 [IU] via SUBCUTANEOUS
  Filled 2023-06-18 (×3): qty 0.1

## 2023-06-18 MED ORDER — ALBUTEROL SULFATE HFA 108 (90 BASE) MCG/ACT IN AERS
2.0000 | INHALATION_SPRAY | RESPIRATORY_TRACT | Status: DC | PRN
  Administered 2023-06-18: 2 via RESPIRATORY_TRACT
  Filled 2023-06-18: qty 6.7

## 2023-06-18 MED ORDER — ALBUTEROL SULFATE (2.5 MG/3ML) 0.083% IN NEBU
2.5000 mg | INHALATION_SOLUTION | Freq: Once | RESPIRATORY_TRACT | Status: AC
  Administered 2023-06-18: 2.5 mg via RESPIRATORY_TRACT
  Filled 2023-06-18: qty 3

## 2023-06-18 MED ORDER — OXYCODONE HCL 5 MG PO TABS
5.0000 mg | ORAL_TABLET | Freq: Once | ORAL | Status: AC
  Administered 2023-06-18: 5 mg via ORAL
  Filled 2023-06-18: qty 1

## 2023-06-18 MED ORDER — INSULIN REGULAR(HUMAN) IN NACL 100-0.9 UT/100ML-% IV SOLN
INTRAVENOUS | Status: DC
  Administered 2023-06-18: 15 [IU]/h via INTRAVENOUS
  Filled 2023-06-18: qty 100

## 2023-06-18 MED ORDER — LACTATED RINGERS IV BOLUS
1000.0000 mL | Freq: Once | INTRAVENOUS | Status: AC
  Administered 2023-06-18: 1000 mL via INTRAVENOUS

## 2023-06-18 MED ORDER — CHLORHEXIDINE GLUCONATE CLOTH 2 % EX PADS
6.0000 | MEDICATED_PAD | Freq: Every day | CUTANEOUS | Status: DC
  Administered 2023-06-18: 6 via TOPICAL

## 2023-06-18 MED ORDER — ACETAMINOPHEN 325 MG PO TABS
650.0000 mg | ORAL_TABLET | Freq: Four times a day (QID) | ORAL | Status: DC | PRN
  Administered 2023-06-18: 650 mg via ORAL
  Filled 2023-06-18: qty 2

## 2023-06-18 MED ORDER — INSULIN ASPART 100 UNIT/ML IJ SOLN
10.0000 [IU] | Freq: Once | INTRAMUSCULAR | Status: AC
  Administered 2023-06-18: 10 [IU] via SUBCUTANEOUS

## 2023-06-18 MED ORDER — ORAL CARE MOUTH RINSE: 15.0000 mL | OROMUCOSAL | Status: DC | PRN

## 2023-06-18 MED ORDER — ALBUTEROL SULFATE (2.5 MG/3ML) 0.083% IN NEBU
2.5000 mg | INHALATION_SOLUTION | RESPIRATORY_TRACT | Status: DC | PRN
  Administered 2023-06-19 – 2023-06-21 (×3): 2.5 mg via RESPIRATORY_TRACT
  Filled 2023-06-18 (×3): qty 3

## 2023-06-18 MED ORDER — LOSARTAN POTASSIUM 50 MG PO TABS
50.0000 mg | ORAL_TABLET | Freq: Every day | ORAL | Status: DC
  Administered 2023-06-19 – 2023-06-20 (×2): 50 mg via ORAL
  Filled 2023-06-18 (×3): qty 1

## 2023-06-18 MED ORDER — DEXTROSE 50 % IV SOLN: 0.0000 mL | INTRAVENOUS | Status: DC | PRN

## 2023-06-18 MED ORDER — PREDNISONE 20 MG PO TABS
40.0000 mg | ORAL_TABLET | Freq: Every day | ORAL | Status: DC
Start: 2023-06-19 — End: 2023-06-19
  Administered 2023-06-19: 40 mg via ORAL
  Filled 2023-06-18: qty 2

## 2023-06-18 MED ORDER — TRAZODONE HCL 50 MG PO TABS
25.0000 mg | ORAL_TABLET | Freq: Every evening | ORAL | Status: DC | PRN
  Administered 2023-06-19 – 2023-06-20 (×2): 25 mg via ORAL
  Filled 2023-06-18 (×2): qty 1

## 2023-06-18 MED ORDER — METHYLPREDNISOLONE SODIUM SUCC 125 MG IJ SOLR
125.0000 mg | Freq: Once | INTRAMUSCULAR | Status: AC
  Administered 2023-06-18: 125 mg via INTRAVENOUS
  Filled 2023-06-18: qty 2

## 2023-06-18 MED ORDER — ONDANSETRON HCL 4 MG PO TABS: 4.0000 mg | ORAL_TABLET | Freq: Four times a day (QID) | ORAL | Status: DC | PRN

## 2023-06-18 MED ORDER — IPRATROPIUM-ALBUTEROL 0.5-2.5 (3) MG/3ML IN SOLN: 3.0000 mL | Freq: Four times a day (QID) | RESPIRATORY_TRACT | Status: DC

## 2023-06-18 MED ORDER — ACETAMINOPHEN 650 MG RE SUPP: 650.0000 mg | Freq: Four times a day (QID) | RECTAL | Status: DC | PRN

## 2023-06-18 MED ORDER — DEXTROSE IN LACTATED RINGERS 5 % IV SOLN: INTRAVENOUS | Status: DC

## 2023-06-18 MED ORDER — INSULIN REGULAR HUMAN 100 UNIT/ML IJ SOLN: 10.0000 [IU] | Freq: Once | INTRAMUSCULAR | Status: DC

## 2023-06-18 MED ORDER — IPRATROPIUM-ALBUTEROL 0.5-2.5 (3) MG/3ML IN SOLN
3.0000 mL | Freq: Once | RESPIRATORY_TRACT | Status: AC
  Administered 2023-06-18: 3 mL via RESPIRATORY_TRACT
  Filled 2023-06-18: qty 3

## 2023-06-18 MED ORDER — AEROCHAMBER PLUS FLO-VU SMALL MISC
1.0000 | Freq: Once | Status: AC
  Administered 2023-06-18: 1
  Filled 2023-06-18: qty 1

## 2023-06-18 NOTE — ED Notes (Signed)
CBG > 600 reported to [employer] RN

## 2023-06-18 NOTE — ED Notes (Signed)
RT Note: VBG completed and handed to Dr. Earlene Plater

## 2023-06-18 NOTE — ED Notes (Signed)
RT Note: Patient given an Albuterol inhaler along with a spacer.for home use. Patient had stated that she was out of medication at home. She was educated the use of the Albuterol inhaler and then educated in the use of the spacer with the inhaler

## 2023-06-18 NOTE — ED Notes (Signed)
[facility] called for transport

## 2023-06-18 NOTE — ED Provider Notes (Signed)
[place] EMERGENCY DEPARTMENT AT [facility] Provider Note   CSN: [id number] Arrival date & time: 06/18/23  2130     History  Chief Complaint  Patient presents with   Cough    MAYUMI RIDDLES is a 62 y.o. female.   Cough 62 year old female history of asthma, chronic bronchitis, atrial fibrillation present for shortness of breath.  She states for about a week she has had cough productive of clear sputum maybe some wheezing.  She is cough somewhat she has some chest tightness mostly with cough.  She feels somewhat short of breath.  No pleuritic pain.  Feels very similar to prior bronchitis episode.  No fever or runny nose.  No sick contacts.  No abdominal pain or vomiting.  She is otherwise been at her baseline health.  She recently had a similar episode treated with steroids which improved it.  She does use an inhaler occasionally at home.     Home Medications Prior to Admission medications   Medication Sig Start Date End Date Taking? Authorizing Provider  albuterol (VENTOLIN HFA) 108 (90 Base) MCG/ACT inhaler Inhale 2 puffs into the lungs every 6 (six) hours as needed for wheezing or shortness of breath. 07/03/21  Yes Orpah Cobb, MD  aspirin EC 81 MG tablet Take 81 mg by mouth daily. 02/15/20  Yes [provider]  losartan (COZAAR) 50 MG tablet Take 50 mg by mouth daily. 04/24/23  Yes [provider]  tiZANidine (ZANAFLEX) 2 MG tablet TAKE 1 TABLET BY MOUTH AT BEDTIME AS NEEDED FOR MUSCLE SPASMS. 12/28/22  Yes Jenel Lucks, MD  predniSONE (DELTASONE) 20 MG tablet Take 2 tablets (40 mg total) by mouth daily. Patient not taking: Reported on 06/18/2023 04/25/23   Mardella Layman, MD  promethazine-dextromethorphan (PROMETHAZINE-DM) 6.25-15 MG/5ML syrup Take 5 mLs by mouth 4 (four) times daily as needed for cough. Patient not taking: Reported on 06/18/2023 04/25/23   Mardella Layman, MD  metoprolol tartrate (LOPRESSOR) 25 MG tablet Take 1 tablet (25 mg total)  by mouth 2 (two) times daily. Patient not taking: Reported on 12/05/2020 11/30/16 12/06/20  Orpah Cobb, MD      Allergies    Bee venom and Lisinopril    Review of Systems   Review of Systems  Respiratory:  Positive for cough.   Review of systems completed and notable as per HPI.  ROS otherwise negative.   Physical Exam Updated Vital Signs BP 95/63   Pulse 80   Temp 97.8 F (36.6 C) (Oral)   Resp 18   Ht 4' 11.5" (1.511 m)   Wt 72.6 kg   SpO2 99%   BMI 31.78 kg/m  Physical Exam Vitals and nursing note reviewed.  Constitutional:      General: She is not in acute distress.    Appearance: She is well-developed.  HENT:     Head: Normocephalic and atraumatic.  Eyes:     Conjunctiva/sclera: Conjunctivae normal.  Cardiovascular:     Rate and Rhythm: Normal rate and regular rhythm.     Pulses: Normal pulses.     Heart sounds: Normal heart sounds. No murmur heard. Pulmonary:     Effort: Pulmonary effort is normal. No respiratory distress.     Comments: Frequent cough, slight expiratory wheeze Abdominal:     Palpations: Abdomen is soft.     Tenderness: There is no abdominal tenderness.  Musculoskeletal:        General: No swelling.     Cervical back: Neck supple.  Right lower leg: No edema.     Left lower leg: No edema.  Skin:    General: Skin is warm and dry.     Capillary Refill: Capillary refill takes less than 2 seconds.  Neurological:     Mental Status: She is alert.  Psychiatric:        Mood and Affect: Mood normal.     ED Results / Procedures / Treatments   Labs (all labs ordered are listed, but only abnormal results are displayed) Labs Reviewed  BASIC METABOLIC PANEL - Abnormal; Notable for the following components:      Result Value   Sodium 117 (*)    Chloride 79 (*)    Glucose, Bld 1,072 (*)    BUN 7 (*)    Calcium 10.7 (*)    All other components within normal limits  CBC WITH DIFFERENTIAL/PLATELET - Abnormal; Notable for the following  components:   MCV 73.5 (*)    MCH 24.0 (*)    All other components within normal limits  BASIC METABOLIC PANEL - Abnormal; Notable for the following components:   Sodium 118 (*)    Chloride 81 (*)    Glucose, Bld 945 (*)    All other components within normal limits  URINALYSIS, ROUTINE W REFLEX MICROSCOPIC - Abnormal; Notable for the following components:   Color, Urine COLORLESS (*)    Glucose, UA >1,000 (*)    Ketones, ur 15 (*)    All other components within normal limits  HEPATIC FUNCTION PANEL - Abnormal; Notable for the following components:   AST 14 (*)    Alkaline Phosphatase 177 (*)    All other components within normal limits  BASIC METABOLIC PANEL - Abnormal; Notable for the following components:   Sodium 129 (*)    Chloride 91 (*)    Glucose, Bld 361 (*)    Calcium 10.5 (*)    All other components within normal limits  GLUCOSE, CAPILLARY - Abnormal; Notable for the following components:   Glucose-Capillary 319 (*)    All other components within normal limits  GLUCOSE, CAPILLARY - Abnormal; Notable for the following components:   Glucose-Capillary 312 (*)    All other components within normal limits  GLUCOSE, CAPILLARY - Abnormal; Notable for the following components:   Glucose-Capillary 270 (*)    All other components within normal limits  GLUCOSE, CAPILLARY - Abnormal; Notable for the following components:   Glucose-Capillary 239 (*)    All other components within normal limits  CBG MONITORING, ED - Abnormal; Notable for the following components:   Glucose-Capillary >600 (*)    All other components within normal limits  I-STAT VENOUS BLOOD GAS, ED - Abnormal; Notable for the following components:   pCO2, Ven 36.9 (*)    pO2, Ven 72 (*)    Sodium 119 (*)    All other components within normal limits  CBG MONITORING, ED - Abnormal; Notable for the following components:   Glucose-Capillary >600 (*)    All other components within normal limits  CBG MONITORING,  ED - Abnormal; Notable for the following components:   Glucose-Capillary >600 (*)    All other components within normal limits  CBG MONITORING, ED - Abnormal; Notable for the following components:   Glucose-Capillary 514 (*)    All other components within normal limits  CBG MONITORING, ED - Abnormal; Notable for the following components:   Glucose-Capillary 414 (*)    All other components within normal limits  CBG MONITORING,  ED - Abnormal; Notable for the following components:   Glucose-Capillary 384 (*)    All other components within normal limits  CBG MONITORING, ED - Abnormal; Notable for the following components:   Glucose-Capillary 295 (*)    All other components within normal limits  CBG MONITORING, ED - Abnormal; Notable for the following components:   Glucose-Capillary 353 (*)    All other components within normal limits  RESP PANEL BY RT-PCR (RSV, FLU A&B, COVID)  RVPGX2  MRSA NEXT GEN BY PCR, NASAL  HIV ANTIBODY (ROUTINE TESTING W REFLEX)  HEMOGLOBIN A1C  TROPONIN I (HIGH SENSITIVITY)    EKG None  Radiology DG Chest Port 1 View  Result Date: 06/18/2023 CLINICAL DATA:  Shortness of breath. EXAM: PORTABLE CHEST 1 VIEW COMPARISON:  Chest radiograph dated June 29, 2021. FINDINGS: The heart size and mediastinal contours are within normal limits. No focal consolidation, pneumothorax, or pleural effusion. No acute osseous abnormality. IMPRESSION: No acute cardiopulmonary findings. Electronically Signed   By: Hart Robinsons M.D.   On: 06/18/2023 11:20    Procedures Procedures    Medications Ordered in ED Medications  insulin regular, human (MYXREDLIN) 100 units/ 100 mL infusion (5.5 Units/hr Intravenous Infusion Verify 06/18/23 1600)  lactated ringers infusion ( Intravenous Infusion Verify 06/18/23 1600)  dextrose 5 % in lactated ringers infusion (has no administration in time range)  dextrose 50 % solution 0-50 mL (has no administration in time range)  Oral care  mouth rinse (has no administration in time range)  Chlorhexidine Gluconate Cloth 2 % PADS 6 each (6 each Topical Given 06/18/23 1653)  aspirin EC tablet 81 mg (has no administration in time range)  losartan (COZAAR) tablet 50 mg (has no administration in time range)  tiZANidine (ZANAFLEX) tablet 2 mg (has no administration in time range)  enoxaparin (LOVENOX) injection 40 mg (has no administration in time range)  acetaminophen (TYLENOL) tablet 650 mg (has no administration in time range)    Or  acetaminophen (TYLENOL) suppository 650 mg (has no administration in time range)  traZODone (DESYREL) tablet 25 mg (has no administration in time range)  ondansetron (ZOFRAN) tablet 4 mg (has no administration in time range)    Or  ondansetron (ZOFRAN) injection 4 mg (has no administration in time range)  predniSONE (DELTASONE) tablet 40 mg (has no administration in time range)  albuterol (PROVENTIL) (2.5 MG/3ML) 0.083% nebulizer solution 2.5 mg (has no administration in time range)  ipratropium-albuterol (DUONEB) 0.5-2.5 (3) MG/3ML nebulizer solution 3 mL (3 mLs Nebulization Given 06/18/23 0817)  methylPREDNISolone sodium succinate (SOLU-MEDROL) 125 mg/2 mL injection 125 mg (125 mg Intravenous Given 06/18/23 0901)  albuterol (PROVENTIL) (2.5 MG/3ML) 0.083% nebulizer solution 2.5 mg (2.5 mg Nebulization Given 06/18/23 0914)  AeroChamber Plus Flo-Vu Small device MISC 1 each (1 each Other Provided for home use 06/18/23 0922)  lactated ringers bolus 1,000 mL (0 mLs Intravenous Stopped 06/18/23 1143)  insulin aspart (novoLOG) injection 10 Units (10 Units Subcutaneous Given 06/18/23 1058)    ED Course/ Medical Decision Making/ A&P Clinical Course as of 06/18/23 1913  Tue Jun 18, 2023  1011 Sodium corrects to at least 133.  Repeat blood sugar shows value was correct. [JD]    Clinical Course User Index [JD] Laurence Spates, MD                                 Medical Decision Making Amount and/or  Complexity of  Data Reviewed Labs: ordered. Radiology: ordered.  Risk Prescription drug management. Decision regarding hospitalization.   Medical Decision Making:   MILLENIA CARANO is a 62 y.o. female who presented to the ED today with cough, shortness of breath for about a week.  Vital signs reviewed.  On exam she is not hypoxic, she is already getting a breathing treatment which is helping her feel better.  She has significant cough with some slight wheezing I suspect she has mixture of asthma/bronchitis flareup.  Will test for COVID and flu for possible viral cause.  Obtain chest x-ray for evaluation of possible pneumonia.  I reviewed her EKG, she has some Q waves and slight ST elevation in the inferolateral leads although this really appears unchanged from her prior EKGs.  Her chest tightness is really with cough I have low suspicion for ACS, dissection, PE.   Patient placed on continuous vitals and telemetry monitoring while in ED which was reviewed periodically.  Reviewed and confirmed nursing documentation for past medical history, family history, social history.  Reassessment and Plan:   On reassessment breathing is improved with steroids, breathing treatment.  Chest x-ray reviewed, no signs of pneumonia.  COVID and flu are negative.  She is feeling a lot better.  Unfortunately her blood sugar is markedly elevated.  She is hyponatremic as well but I think this is pseudohyponatremia, sodium corrects to about 133.  Blood gas without signs of acidosis, normal anion gap and bicarb not consistent consistent with DKA.  Her calculated serum awesome is actually normal less consistent with HHS especially with no mental status changes.  I did order some IV fluids as well as some insulin.  She has a history of prediabetes but no documented history of diabetes.  She received steroids today but no steroids prior to her hyperglycemia.  Unclear what is causing this but I think given the severity she needs  admission for blood glucose control.  I talked with Dr. Robb Matar with hospitalist service who recommends insulin drip.  I did start insulin drip, noted her blood sugar corrected pretty significantly down to about 300 after several hours.  I talked again with Dr. Robb Matar about this, he recommends continuing insulin drip given no mental status changes or clinical signs of cerebral edema.  She was transferred to the hospital for admission.   Patient's presentation is most consistent with acute presentation with potential threat to life or bodily function.           Final Clinical Impression(s) / ED Diagnoses Final diagnoses:  Hyperglycemia    Rx / DC Orders ED Discharge Orders     None         Laurence Spates, MD 06/18/23 [phone]

## 2023-06-18 NOTE — ED Triage Notes (Signed)
In for eval of coughing for 1 week. Aching in chest worse with cough. Denies headache or rhinorrhea.

## 2023-06-18 NOTE — Progress Notes (Signed)
Plan of Care Note for accepted transfer   Patient: Marissa Weber MRN: [id number]   DOA: 06/18/2023  [facility]ility [facility] transfer: [facility] Requesting Provider: Fulton Reek, MD Reason for transfer: Nonketotic hyperosmolar state. Facility course:  62 year old female with past medical history of asthma/chronic bronchitis, atrial fibrillation who presented with dyspnea and productive cough of clear sputum.  She was incidentally found to be hyperglycemic.  She was started on an insulin infusion.    Plan of care: The patient is accepted for admission to [facility] unit, at [facility]..   Author: Bobette Mo, MD 06/18/2023  Check [email or web address] for on-call coverage.  Nursing staff, Please call [facility] Admits & Consults System-Wide number on Amion as soon as patient's arrival, so appropriate admitting provider can evaluate the pt.

## 2023-06-18 NOTE — H&P (Signed)
History and Physical  KENLI WINNING IHK:[id number] DOB: Oct 28, 1960 DOA: 06/18/2023  PCP: Orpah Cobb, MD   Chief Complaint: Cough  HPI: Marissa Weber is a 62 y.o. female with medical history significant for atrial fibrillation not on anticoagulation, upper GI bleed, alcoholic cirrhosis, hypertension, asthma and ongoing tobacco abuse admitted to the hospital with hyperosmolar hyperglycemic state.  Patient states that her PCP had previously told her that she was prediabetic, but she was unable to tolerate the oral medication she was prescribed.  She does not check her blood sugars, and eats a regular diet.  For the last week or so, she has had a cough productive of clear sputum.  Denies any chest pain, fevers, nausea, vomiting, or sick contacts.  Denies any significant wheezing, orthopnea, or dyspnea with exertion.  She came to [facility] today for evaluation of her cough.  ED Course: She was found to be saturating well on room air, and vital signs were unremarkable.  Lab work revealed normal CBC but blood sugar of 1072, sodium 118, and normal renal function.  She was given IV fluids, started on IV insulin drip and patient was accepted for observation admission to stepdown unit at [facility].  Currently the patient is resting comfortably, and has no complaints.  Review of Systems: Please see HPI for pertinent positives and negatives. A complete 10 system review of systems are otherwise negative.  Past Medical History:  Diagnosis Date   Arthritis    hip  and knees    Asthma    hx of years ago    Atrial fibrillation (HCC) 11/2016   Blood transfusion    hx of years ago    Cholecystitis    Dysrhythmia    a fib   Hypertension    Past Surgical History:  Procedure Laterality Date   ANKLE SURGERY     BACK SURGERY     SPINAL FUSION   BIOPSY  06/30/2021   Procedure: BIOPSY;  Surgeon: Imogene Burn, MD;  Location: [facility] ENDOSCOPY;  Service: Gastroenterology;;   CESAREAN SECTION     cesearan      x4   CHOLECYSTECTOMY N/A 12/11/2013   Procedure: LAPAROSCOPIC CHOLECYSTECTOMY WITH INTRAOPERATIVE CHOLANGIOGRAM;  Surgeon: Wilmon Arms. Corliss Skains, MD;  Location: [facility] OR;  Service: General;  Laterality: N/A;   ESOPHAGOGASTRODUODENOSCOPY (EGD) WITH PROPOFOL N/A 06/30/2021   Procedure: ESOPHAGOGASTRODUODENOSCOPY (EGD) WITH PROPOFOL;  Surgeon: Imogene Burn, MD;  Location: [facility] ENDOSCOPY;  Service: Gastroenterology;  Laterality: N/A;   LEFT HEART CATH AND CORONARY ANGIOGRAPHY N/A 11/29/2016   Procedure: Left Heart Cath and Coronary Angiography;  Surgeon: Orpah Cobb, MD;  Location: [facility] INVASIVE CV LAB;  Service: Cardiovascular;  Laterality: N/A;   TOTAL HIP ARTHROPLASTY  10/22/2011   Procedure: TOTAL HIP ARTHROPLASTY;  Surgeon: Loanne Drilling, MD;  Location: [facility] ORS;  Service: Orthopedics;  Laterality: Right;   TOTAL HIP ARTHROPLASTY Left 12/05/2016   Procedure: LEFT TOTAL HIP ARTHROPLASTY ANTERIOR APPROACH;  Surgeon: Ollen Gross, MD;  Location: [facility] ORS;  Service: Orthopedics;  Laterality: Left;    Social History:  reports that she has been smoking cigarettes. She has a 5 pack-year smoking history. She has never used smokeless tobacco. She reports current alcohol use of about 5.0 standard drinks of alcohol per week. She reports that she does not use drugs.   Allergies  Allergen Reactions   Bee Venom Swelling and Other (See Comments)    Swelling wherever the sting is. Also gets dizzy   Lisinopril Swelling  Family History  Problem Relation Age of Onset   Diabetes Mother    Hypertension Father    Colon cancer Neg Hx    Esophageal cancer Neg Hx    Rectal cancer Neg Hx    Stomach cancer Neg Hx      Prior to Admission medications   Medication Sig Start Date End Date Taking? Authorizing Provider  albuterol (VENTOLIN HFA) 108 (90 Base) MCG/ACT inhaler Inhale 2 puffs into the lungs every 6 (six) hours as needed for wheezing or shortness of breath. 07/03/21  Yes Orpah Cobb, MD  aspirin EC 81 MG  tablet Take 81 mg by mouth daily. 02/15/20  Yes [provider]  losartan (COZAAR) 50 MG tablet Take 50 mg by mouth daily. 04/24/23  Yes [provider]  tiZANidine (ZANAFLEX) 2 MG tablet TAKE 1 TABLET BY MOUTH AT BEDTIME AS NEEDED FOR MUSCLE SPASMS. 12/28/22  Yes Jenel Lucks, MD  predniSONE (DELTASONE) 20 MG tablet Take 2 tablets (40 mg total) by mouth daily. Patient not taking: Reported on 06/18/2023 04/25/23   Mardella Layman, MD  promethazine-dextromethorphan (PROMETHAZINE-DM) 6.25-15 MG/5ML syrup Take 5 mLs by mouth 4 (four) times daily as needed for cough. Patient not taking: Reported on 06/18/2023 04/25/23   Mardella Layman, MD  metoprolol tartrate (LOPRESSOR) 25 MG tablet Take 1 tablet (25 mg total) by mouth 2 (two) times daily. Patient not taking: Reported on 12/05/2020 11/30/16 12/06/20  Orpah Cobb, MD    Physical Exam: BP 119/72 (BP Location: Left Arm)   Pulse 94   Temp 97.8 F (36.6 C) (Oral)   Resp (!) 22   Ht 4' 11.5" (1.511 m)   Wt 72.6 kg   SpO2 97%   BMI 31.78 kg/m   General:  Alert, oriented, calm, in no acute distress, speaking full sentences, resting comfortably on room air.  Dry sounding cough. Eyes: EOMI, clear conjuctivae, white sclerea Neck: supple, no masses, trachea mildline  Cardiovascular: RRR, no murmurs or rubs, no peripheral edema  Respiratory: clear to auscultation bilaterally, no wheezes, no crackles  Abdomen: soft, nontender, nondistended, normal bowel tones heard  Skin: dry, no rashes  Musculoskeletal: no joint effusions, normal range of motion  Psychiatric: appropriate affect, normal speech  Neurologic: extraocular muscles intact, clear speech, moving all extremities with intact sensorium         Labs on Admission:  Basic Metabolic Panel: Recent Labs  Lab 06/18/23 0901 06/18/23 1030  NA 117* 118*  119*  K 4.3 3.7  3.8  CL 79* 81*  CO2 24 23  GLUCOSE 1,072* 945*  BUN 7* 9  CREATININE 0.92 0.88  CALCIUM 10.7* 10.2    Liver Function Tests: Recent Labs  Lab 06/18/23 1030  AST 14*  ALT 8  ALKPHOS 177*  BILITOT 0.6  PROT 7.6  ALBUMIN 3.7   No results for input(s): "LIPASE", "AMYLASE" in the last 168 hours. No results for input(s): "AMMONIA" in the last 168 hours. CBC: Recent Labs  Lab 06/18/23 0901 06/18/23 1030  WBC 6.9  --   NEUTROABS 4.0  --   HGB 12.2 14.3  HCT 37.4 42.0  MCV 73.5*  --   PLT 215  --    Cardiac Enzymes: No results for input(s): "CKTOTAL", "CKMB", "CKMBINDEX", "TROPONINI" in the last 168 hours.  BNP (last 3 results) No results for input(s): "BNP" in the last 8760 hours.  ProBNP (last 3 results) No results for input(s): "PROBNP" in the last 8760 hours.  CBG: Recent Labs  Lab 06/18/23 1318 06/18/23 1348 06/18/23 1423 06/18/23 1452 06/18/23 1605  GLUCAP 414* 384* 295* 353* 319*    Radiological Exams on Admission: DG Chest Port 1 View  Result Date: 06/18/2023 CLINICAL DATA:  Shortness of breath. EXAM: PORTABLE CHEST 1 VIEW COMPARISON:  Chest radiograph dated June 29, 2021. FINDINGS: The heart size and mediastinal contours are within normal limits. No focal consolidation, pneumothorax, or pleural effusion. No acute osseous abnormality. IMPRESSION: No acute cardiopulmonary findings. Electronically Signed   By: Hart Robinsons M.D.   On: 06/18/2023 11:20    Assessment/Plan GESELLE GIASSON is a 62 y.o. female with medical history significant for atrial fibrillation not on anticoagulation, upper GI bleed, prior alcohol abuse, hypertension, asthma and ongoing tobacco abuse admitted to the hospital with hyperosmolar hyperglycemic state.  Hyperosmolar hyperglycemic state-in the setting of likely upper respiratory infection, and history of being prediabetic.  She has not recently been on any steroids or any other new medications.  For the last few days, she has been feeling more thirsty and urinating more, likely due to her hyperglycemia. -Observation admission to  stepdown -Continue IV insulin drip and fluids, until blood sugar is below 300 -Anticipate transition off of insulin drip once blood sugar improves -Carb controlled diet when able to eat -Will plan on moderate dose sliding scale insulin -Check hemoglobin A1c -Follow-up BMP as anticipate she may have some hypokalemia due to IV insulin infusion  Hyponatremia-this is likely pseudohyponatremia in the setting of significant hyperglycemia.  Recheck BMP now.  Hypertension-continue home losartan  URI-with cough productive of sputum, patient currently without any significant wheezing, sputum production or significant cough.  Given lack of active wheezing or other significant respiratory symptoms, as well as in the setting of hyperglycemia, will avoid further steroid doses. -Note negative respiratory viral panel -Scheduled DuoNebs, with as needed albuterol  History of atrial fibrillation-currently in normal sinus rhythm, patient states that she was on anticoagulants in the past but is currently not on any anticoagulation and is not sure why.  On review of her chart, she has a history of significant upper GI bleeding in 2022, perhaps this is why she is not anticoagulated.  DVT prophylaxis: Lovenox   Full code  Consults called: None  Admission status: Observation  Time spent: 59 minutes  Debbe Crumble Sharlette Dense MD [facility] Pager [phone]  If 7PM-7AM, please contact night-coverage [email or web address] Password [email or web address]  06/18/2023, 4:08 PM

## 2023-06-18 NOTE — ED Notes (Signed)
RT Note: Patient stated she has HX Asthma  and bronchitis. Patient was given a Duoneb due to chest tightness with cough

## 2023-06-19 DIAGNOSIS — E871 Hypo-osmolality and hyponatremia: Secondary | ICD-10-CM | POA: Diagnosis not present

## 2023-06-19 DIAGNOSIS — I10 Essential (primary) hypertension: Secondary | ICD-10-CM | POA: Diagnosis not present

## 2023-06-19 DIAGNOSIS — E1100 Type 2 diabetes mellitus with hyperosmolarity without nonketotic hyperglycemic-hyperosmolar coma (NKHHC): Secondary | ICD-10-CM | POA: Diagnosis not present

## 2023-06-19 DIAGNOSIS — E861 Hypovolemia: Secondary | ICD-10-CM | POA: Diagnosis not present

## 2023-06-19 LAB — GLUCOSE, CAPILLARY
Glucose-Capillary: 267 mg/dL — ABNORMAL HIGH (ref 70–99)
Glucose-Capillary: 419 mg/dL — ABNORMAL HIGH (ref 70–99)
Glucose-Capillary: 526 mg/dL (ref 70–99)
Glucose-Capillary: 514 mg/dL (ref 70–99)
Glucose-Capillary: 452 mg/dL — ABNORMAL HIGH (ref 70–99)
Glucose-Capillary: 522 mg/dL (ref 70–99)
Glucose-Capillary: 401 mg/dL — ABNORMAL HIGH (ref 70–99)

## 2023-06-19 LAB — BASIC METABOLIC PANEL
Anion gap: 12 (ref 5–15)
BUN: 17 mg/dL (ref 8–23)
CO2: 22 mmol/L (ref 22–32)
Calcium: 9.5 mg/dL (ref 8.9–10.3)
Chloride: 91 mmol/L — ABNORMAL LOW (ref 98–111)
Creatinine, Ser: 0.87 mg/dL (ref 0.44–1.00)
GFR, Estimated: >60 mL/min (ref >60)
Glucose, Bld: 604 mg/dL (ref 70–99)
Potassium: 3.8 mmol/L (ref 3.5–5.1)
Sodium: 125 mmol/L — ABNORMAL LOW (ref 135–145)

## 2023-06-19 LAB — GLUCOSE, RANDOM: Glucose, Bld: 507 mg/dL (ref 70–99)

## 2023-06-19 LAB — OSMOLALITY: Osmolality: 301 mosm/kg — ABNORMAL HIGH (ref 275–295)

## 2023-06-19 LAB — OSMOLALITY, URINE: Osmolality, Ur: 584 mosm/kg (ref 300–900)

## 2023-06-19 LAB — SODIUM, URINE, RANDOM: Sodium, Ur: <10 mmol/L

## 2023-06-19 MED ORDER — CHLORHEXIDINE GLUCONATE CLOTH 2 % EX PADS: 6.0000 | MEDICATED_PAD | Freq: Every day | CUTANEOUS | Status: DC

## 2023-06-19 MED ORDER — INSULIN ASPART 100 UNIT/ML IJ SOLN
5.0000 [IU] | Freq: Three times a day (TID) | INTRAMUSCULAR | Status: DC
  Administered 2023-06-19 – 2023-06-21 (×6): 5 [IU] via SUBCUTANEOUS

## 2023-06-19 MED ORDER — INSULIN ASPART 100 UNIT/ML IV SOLN
15.0000 [IU] | Freq: Once | INTRAVENOUS | Status: AC
  Administered 2023-06-19: 15 [IU] via INTRAVENOUS

## 2023-06-19 MED ORDER — CYCLOBENZAPRINE HCL 5 MG PO TABS
5.0000 mg | ORAL_TABLET | Freq: Three times a day (TID) | ORAL | Status: DC | PRN
  Administered 2023-06-19 – 2023-06-21 (×6): 5 mg via ORAL
  Filled 2023-06-19 (×6): qty 1

## 2023-06-19 MED ORDER — INSULIN ASPART 100 UNIT/ML IV SOLN
20.0000 [IU] | Freq: Once | INTRAVENOUS | Status: AC
  Administered 2023-06-19: 20 [IU] via INTRAVENOUS

## 2023-06-19 MED ORDER — LIVING WELL WITH DIABETES BOOK
Freq: Once | Status: AC
  Filled 2023-06-19: qty 1

## 2023-06-19 MED ORDER — INSULIN GLARGINE-YFGN 100 UNIT/ML ~~LOC~~ SOLN: 30.0000 [IU] | Freq: Every day | SUBCUTANEOUS | Status: DC

## 2023-06-19 MED ORDER — INSULIN STARTER KIT- PEN NEEDLES (ENGLISH)
1.0000 | Freq: Once | Status: AC
Start: 2023-06-19 — End: 2023-06-19
  Administered 2023-06-19: 1
  Filled 2023-06-19: qty 1

## 2023-06-19 MED ORDER — INSULIN ASPART 100 UNIT/ML IJ SOLN
8.0000 [IU] | Freq: Once | INTRAMUSCULAR | Status: AC
  Administered 2023-06-19: 8 [IU] via SUBCUTANEOUS

## 2023-06-19 MED ORDER — SODIUM CHLORIDE 0.9% FLUSH
3.0000 mL | Freq: Two times a day (BID) | INTRAVENOUS | Status: DC
  Administered 2023-06-19 – 2023-06-21 (×5): 3 mL via INTRAVENOUS

## 2023-06-19 NOTE — Plan of Care (Signed)

## 2023-06-19 NOTE — Inpatient Diabetes Management (Signed)
Inpatient Diabetes Program Recommendations  AACE/ADA: New Consensus Statement on Inpatient Glycemic Control (2015)  Target Ranges:  Prepandial:   less than 140 mg/dL      Peak postprandial:   less than 180 mg/dL (1-2 hours)      Critically ill patients:  140 - 180 mg/dL   Lab Results  Component Value Date   GLUCAP 419 (H) 06/19/2023    Review of Glycemic Control  Diabetes history: New Diabetes Diagnosis  Current orders for Inpatient glycemic control:  Semglee 30 units qhs Novolog 0-13 units tid + hs   Inpatient Diabetes Program Recommendations:    Pt on prednisone aiding in current hyperglycemia IV insulin gtt rates were 7.5-11 units/hour before gtt was d/c'd.  -   Add Novolog 6 units tid meal coverage due to steroids -   Add Semglee 20 units qam in addition to the 30 units ordered tonight  Will probably need more basal insulin, may need 20 more units in the am in addition to the 30 units tonight, Pt had a total of 20 units of basal insulin on board when lab glucose was 600.   Spoke with pt and husband at bedside regarding the new Diabetes diagnosis. Discussed glucose levels at the time the pt presented to the ED. Discussed glucose and A1c goals. Discussed basic pathophysiology of DM Type 2, basic home care, importance of checking CBGs and maintaining good CBG control to prevent long-term and short-term complications.  Reviewed signs and symptoms of hyperglycemia and hypoglycemia along with treatment for both. Discussed impact of nutrition, exercise, stress, sickness, and medications on diabetes control. Reviewed Living Well with diabetes booklet and encouraged patient to read through entire book. Informed patient that she maybe prescribed basal insulin at time of discharge in addition to an oral agent. I discuss the role of the current steroids ordered on her glucose trends. I cautioned her and told her to check glucose trends often especially when the dose is titrated to prevent  hypoglycemia at home.  Discussed basal insulin in detail (how to take it, when to take it). Asked patient to check his glucose at least 2 times per day (before meals and at bedtime) and to keep a log book of glucose readings and insulin taken. Explained how the doctor he follows up with can use the log book to continue to make insulin adjustments if needed. Reviewed and demonstrated how to draw up and administer insulin via insulin pen. Patient was able to successfully demonstrate. Patient verbalized understanding of information discussed and has no further questions at this time related to diabetes.   RNs to provide ongoing basic DM education at bedside with this patient and engage patient to actively check blood glucose and administer insulin injections.   Will follow glucose trends and work on d/c [employer].   Thanks, Christena Deem RN, MSN, BC-ADM Inpatient Diabetes Coordinator Team Pager [phone] (8a-5p)

## 2023-06-19 NOTE — Progress Notes (Signed)
PROGRESS NOTE    Marissa Weber  ZOX:[id number] DOB: [birth year] DOA: 06/18/2023 PCP: Orpah Cobb, MD   Brief Narrative:  HPI: Marissa Weber is a 61 y.o. female with medical history significant for atrial fibrillation not on anticoagulation, upper GI bleed, alcoholic cirrhosis, hypertension, asthma and ongoing tobacco abuse admitted to the hospital with hyperosmolar hyperglycemic state.  Patient states that her PCP had previously told her that she was prediabetic, but she was unable to tolerate the oral medication she was prescribed.  She does not check her blood sugars, and eats a regular diet.  For the last week or so, she has had a cough productive of clear sputum.  Denies any chest pain, fevers, nausea, vomiting, or sick contacts.  Denies any significant wheezing, orthopnea, or dyspnea with exertion.  She came to [facility] today for evaluation of her cough.   ED Course: She was found to be saturating well on room air, and vital signs were unremarkable.  Lab work revealed normal CBC but blood sugar of 1072, sodium 118, and normal renal function.  She was given IV fluids, started on IV insulin drip and patient was accepted for observation admission to stepdown unit at [facility].  Currently the patient is resting comfortably, and has no complaints.  Assessment & Plan:   Principal Problem:   Diabetic hyperosmolar non-ketotic state (HCC)  Hyperosmolar nonketotic state: Came in with blood sugar of more than thousand.  Per patient, she was seen by PCP or some kind of doctors about 6 months ago and she was told that she has prediabetes or early diabetes.  She does not recall her hemoglobin A1c.  Was started on fluids and insulin drip, blood sugar improved, both of them have been reduced.  She has been started on Semglee 10 units twice daily and SSI.  Still hyperglycemic in the range of 400.  Hemoglobin A1c still pending.  Her hemoglobin A1c is going to be very high based on the blood sugars.   Patient drinks almost 6 bottles of soda/16 ounces and drinks almost 5 L of water every day.  I have educated her about the diabetes, diet.  However I do think that she needs extensive education about her diabetes and dietary modification.  I have consulted diabetes educator, she does need to be seen by them before discharge.  We also need to wait for hemoglobin A1c and adjust her Semglee before we can safely discharge her home.  I will increase her Semglee and make it once daily at night 30 units and continue SSI.  Pseudohyponatremia: Secondary to hyperglycemia.  Improving.  Repeating BMP now.  Essential hypertension: Continue losartan.  Blood pressure controlled.  URI: COVID, flu and RSV negative.  She has no symptoms.  No wheezes on examination.  Chest x-ray negative.  Will discontinue steroids.  Continue DuoNebs.  History of atrial fibrillation: In normal sinus rhythm.  Patient was on anticoagulant in the past but she is not on any currently and she is not sure why.  Per history, she has history of upper GI bleed in 2022.  DVT prophylaxis: enoxaparin (LOVENOX) injection 40 mg Start: 06/18/23 2200 SCDs Start: 06/18/23 1608   Code Status: Full Code  Family Communication: Husband present at bedside.  Plan of care discussed with patient in length and he/she verbalized understanding and agreed with it.  Status is: Observation The patient will require care spanning > 2 midnights and should be moved to inpatient because: Needs better control of blood sugar  and needs education by diabetes coordinator.   Estimated body mass index is 31.78 kg/m as calculated from the following:   Height as of this encounter: 4' 11.5" (1.511 m).   Weight as of this encounter: 72.6 kg.    Nutritional Assessment: Body mass index is 31.78 kg/m.Marland Kitchen Seen by dietician.  I agree with the assessment and plan as outlined below: Nutrition Status:        . Skin Assessment: I have examined the patient's skin and I  agree with the wound assessment as performed by the wound care RN as outlined below:    Consultants:  None  Procedures:  None  Antimicrobials:  Anti-infectives (From admission, onward)    None         Subjective: Patient seen and examined.  She has no complaints.  Husband at the bedside.  Objective: Vitals:   06/19/23 0800 06/19/23 0900 06/19/23 1109 06/19/23 1200  BP: (!) 156/88     Pulse: 84 84 80 (!) 109  Resp: 14 14 20 16   Temp: 98.2 F (36.8 C)   98.3 F (36.8 C)  TempSrc: Oral   Oral  SpO2: 96% 95% 95% 94%  Weight:      Height:        Intake/Output Summary (Last 24 hours) at 06/19/2023 1244 Last data filed at 06/19/2023 1040 Gross per 24 hour  Intake 1757.56 ml  Output --  Net 1757.56 ml   Filed Weights   06/18/23 0802  Weight: 72.6 kg    Examination:  General exam: Appears calm and comfortable  Respiratory system: Clear to auscultation. Respiratory effort normal. Cardiovascular system: S1 & S2 heard, RRR. No JVD, murmurs, rubs, gallops or clicks. No pedal edema. Gastrointestinal system: Abdomen is nondistended, soft and nontender. No organomegaly or masses felt. Normal bowel sounds heard. Central nervous system: Alert and oriented. No focal neurological deficits. Extremities: Symmetric 5 x 5 power. Skin: No rashes, lesions or ulcers Psychiatry: Judgement and insight appear normal. Mood & affect appropriate.    Data Reviewed: I have personally reviewed following labs and imaging studies  CBC: Recent Labs  Lab 06/18/23 0901 06/18/23 1030  WBC 6.9  --   NEUTROABS 4.0  --   HGB 12.2 14.3  HCT 37.4 42.0  MCV 73.5*  --   PLT 215  --    Basic Metabolic Panel: Recent Labs  Lab 06/18/23 0901 06/18/23 1030 06/18/23 1603  NA 117* 118*  119* 129*  K 4.3 3.7  3.8 3.7  CL 79* 81* 91*  CO2 24 23 25   GLUCOSE 1,072* 945* 361*  BUN 7* 9 12  CREATININE 0.92 0.88 0.96  CALCIUM 10.7* 10.2 10.5*   GFR: Estimated Creatinine Clearance: 53.4  mL/min (by C-G formula based on SCr of 0.96 mg/dL). Liver Function Tests: Recent Labs  Lab 06/18/23 1030  AST 14*  ALT 8  ALKPHOS 177*  BILITOT 0.6  PROT 7.6  ALBUMIN 3.7   No results for input(s): "LIPASE", "AMYLASE" in the last 168 hours. No results for input(s): "AMMONIA" in the last 168 hours. Coagulation Profile: No results for input(s): "INR", "PROTIME" in the last 168 hours. Cardiac Enzymes: No results for input(s): "CKTOTAL", "CKMB", "CKMBINDEX", "TROPONINI" in the last 168 hours. BNP (last 3 results) No results for input(s): "PROBNP" in the last 8760 hours. HbA1C: No results for input(s): "HGBA1C" in the last 72 hours. CBG: Recent Labs  Lab 06/18/23 2115 06/18/23 2210 06/18/23 2313 06/19/23 0740 06/19/23 1139  GLUCAP 222* 230* 200*  267* 419*   Lipid Profile: No results for input(s): "CHOL", "HDL", "LDLCALC", "TRIG", "CHOLHDL", "LDLDIRECT" in the last 72 hours. Thyroid Function Tests: No results for input(s): "TSH", "T4TOTAL", "FREET4", "T3FREE", "THYROIDAB" in the last 72 hours. Anemia Panel: No results for input(s): "VITAMINB12", "FOLATE", "FERRITIN", "TIBC", "IRON", "RETICCTPCT" in the last 72 hours. Sepsis Labs: No results for input(s): "PROCALCITON", "LATICACIDVEN" in the last 168 hours.  Recent Results (from the past 240 hour(s))  Resp panel by RT-PCR (RSV, Flu A&B, Covid) Anterior Nasal Swab     Status: None   Collection Time: 06/18/23  9:01 AM   Specimen: Anterior Nasal Swab  Result Value Ref Range Status   SARS Coronavirus 2 by RT PCR NEGATIVE NEGATIVE Final    Comment: (NOTE) SARS-CoV-2 target nucleic acids are NOT DETECTED.  The SARS-CoV-2 RNA is generally detectable in upper respiratory specimens during the acute phase of infection. The lowest concentration of SARS-CoV-2 viral copies this assay can detect is 138 copies/mL. A negative result does not preclude SARS-Cov-2 infection and should not be used as the sole basis for treatment or other  patient management decisions. A negative result may occur with  improper specimen collection/handling, submission of specimen other than nasopharyngeal swab, presence of viral mutation(s) within the areas targeted by this assay, and inadequate number of viral copies(<138 copies/mL). A negative result must be combined with clinical observations, patient history, and epidemiological information. The expected result is Negative.  Fact Sheet for Patients:  BloggerCourse.com  Fact Sheet for Healthcare Providers:  SeriousBroker.it  This test is no t yet approved or cleared by the [place] FDA and  has been authorized for detection and/or diagnosis of SARS-CoV-2 by FDA under an Emergency Use Authorization (EUA). This EUA will remain  in effect (meaning this test can be used) for the duration of the COVID-19 declaration under Section 564(b)(1) of the Act, 21 U.S.C.section 360bbb-3(b)(1), unless the authorization is terminated  or revoked sooner.       Influenza A by PCR NEGATIVE NEGATIVE Final   Influenza B by PCR NEGATIVE NEGATIVE Final    Comment: (NOTE) The Xpert Xpress SARS-CoV-2/FLU/RSV plus assay is intended as an aid in the diagnosis of influenza from Nasopharyngeal swab specimens and should not be used as a sole basis for treatment. Nasal washings and aspirates are unacceptable for Xpert Xpress SARS-CoV-2/FLU/RSV testing.  Fact Sheet for Patients: BloggerCourse.com  Fact Sheet for Healthcare Providers: SeriousBroker.it  This test is not yet approved or cleared by the [place] FDA and has been authorized for detection and/or diagnosis of SARS-CoV-2 by FDA under an Emergency Use Authorization (EUA). This EUA will remain in effect (meaning this test can be used) for the duration of the COVID-19 declaration under Section 564(b)(1) of the Act, 21 U.S.C. section  360bbb-3(b)(1), unless the authorization is terminated or revoked.     Resp Syncytial Virus by PCR NEGATIVE NEGATIVE Final    Comment: (NOTE) Fact Sheet for Patients: BloggerCourse.com  Fact Sheet for Healthcare Providers: SeriousBroker.it  This test is not yet approved or cleared by the [place] FDA and has been authorized for detection and/or diagnosis of SARS-CoV-2 by FDA under an Emergency Use Authorization (EUA). This EUA will remain in effect (meaning this test can be used) for the duration of the COVID-19 declaration under Section 564(b)(1) of the Act, 21 U.S.C. section 360bbb-3(b)(1), unless the authorization is terminated or revoked.  Performed at [facility], [address], [place], [place] [address]   MRSA Next Gen  by PCR, Nasal     Status: None   Collection Time: 06/18/23  4:44 PM   Specimen: Nasal Mucosa; Nasal Swab  Result Value Ref Range Status   MRSA by PCR Next Gen NOT DETECTED NOT DETECTED Final    Comment: (NOTE) The GeneXpert MRSA Assay (FDA approved for NASAL specimens only), is one component of a comprehensive MRSA colonization surveillance program. It is not intended to diagnose MRSA infection nor to guide or monitor treatment for MRSA infections. Test performance is not FDA approved in patients less than 36 years old. Performed at [facility], [address] [place], [place] [address]      Radiology Studies: [facility] Chest Port 1 View  Result Date: 06/18/2023 CLINICAL DATA:  Shortness of breath. EXAM: PORTABLE CHEST 1 VIEW COMPARISON:  Chest radiograph dated June 29, 2021. FINDINGS: The heart size and mediastinal contours are within normal limits. No focal consolidation, pneumothorax, or pleural effusion. No acute osseous abnormality. IMPRESSION: No acute cardiopulmonary findings. Electronically Signed   By: Hart Robinsons M.D.   On: 06/18/2023 11:20     Scheduled Meds:  aspirin EC  81 mg Oral Daily   [START ON 06/20/2023] Chlorhexidine Gluconate Cloth  6 each Topical Q0600   enoxaparin (LOVENOX) injection  40 mg Subcutaneous Q24H   insulin aspart  0-15 Units Subcutaneous TID WC   insulin aspart  0-5 Units Subcutaneous QHS   insulin glargine-yfgn  10 Units Subcutaneous BID   losartan  50 mg Oral Daily   sodium chloride flush  3 mL Intravenous Q12H   tiZANidine  2 mg Oral QHS   Continuous Infusions:   LOS: 0 days   Hughie Closs, MD [facility]  06/19/2023, 12:44 PM   *Please note that this is a verbal dictation therefore any spelling or grammatical errors are due to the "Dragon Medical One" system interpretation.  Please page via Amion and do not message via secure chat for urgent patient care matters. Secure chat can be used for non urgent patient care matters.  How to contact the [facility] Attending or Consulting provider 7A - 7P or covering provider during after hours 7P -7A, for this patient?  Check the care team in [facility] and look for a) attending/consulting TRH provider listed and b) the [facility] team listed. Page or secure chat 7A-7P. Log into [email or web address] and use [place]'s universal password to access. If you do not have the password, please contact the hospital operator. Locate the [facility] provider you are looking for under [facility] and page to a number that you can be directly reached. If you still have difficulty reaching the provider, please page the [facility] (Director on Call) for the Hospitalists listed on amion for assistance.

## 2023-06-19 NOTE — Progress Notes (Addendum)
Latest Reference Range & Units 06/19/23 07:40 06/19/23 11:39 06/19/23 15:38 06/19/23 16:34  Glucose-Capillary 70 - 99 mg/dL 409 (H) 811 (H) 914 (HH) 514 (HH)  (HH): Data is critically high (H): Data is abnormally high   Latest Reference Range & Units 06/19/23 13:17 06/19/23 15:57  Glucose 70 - 99 mg/dL 782 (HH) 956 (HH)  (HH): Data is critically high  Pt blood glucose steadily rising despite subcutaneous insulin injections. MD made aware, 1x dose of 15 units IV insulin given. Another order for 1x dose of 20 units IV insulin to be given. Will continue to monitor.

## 2023-06-20 DIAGNOSIS — E1100 Type 2 diabetes mellitus with hyperosmolarity without nonketotic hyperglycemic-hyperosmolar coma (NKHHC): Secondary | ICD-10-CM | POA: Diagnosis not present

## 2023-06-20 LAB — BASIC METABOLIC PANEL
Anion gap: 9 (ref 5–15)
BUN: 19 mg/dL (ref 8–23)
CO2: 27 mmol/L (ref 22–32)
Calcium: 9.8 mg/dL (ref 8.9–10.3)
Chloride: 91 mmol/L — ABNORMAL LOW (ref 98–111)
Creatinine, Ser: 0.83 mg/dL (ref 0.44–1.00)
GFR, Estimated: >60 mL/min (ref >60)
Glucose, Bld: 340 mg/dL — ABNORMAL HIGH (ref 70–99)
Potassium: 3.6 mmol/L (ref 3.5–5.1)
Sodium: 127 mmol/L — ABNORMAL LOW (ref 135–145)

## 2023-06-20 LAB — GLUCOSE, CAPILLARY
Glucose-Capillary: 375 mg/dL — ABNORMAL HIGH (ref 70–99)
Glucose-Capillary: 373 mg/dL — ABNORMAL HIGH (ref 70–99)
Glucose-Capillary: 157 mg/dL — ABNORMAL HIGH (ref 70–99)
Glucose-Capillary: 263 mg/dL — ABNORMAL HIGH (ref 70–99)

## 2023-06-20 LAB — SODIUM: Sodium: 131 mmol/L — ABNORMAL LOW (ref 135–145)

## 2023-06-20 LAB — HEMOGLOBIN A1C
Hgb A1c MFr Bld: >15.5 % — ABNORMAL HIGH (ref 4.8–5.6)
Mean Plasma Glucose: >398 mg/dL

## 2023-06-20 MED ORDER — SODIUM CHLORIDE 0.9 % IV SOLN: INTRAVENOUS | Status: DC

## 2023-06-20 MED ORDER — INSULIN GLARGINE-YFGN 100 UNIT/ML ~~LOC~~ SOLN
30.0000 [IU] | Freq: Two times a day (BID) | SUBCUTANEOUS | Status: DC
  Administered 2023-06-20 (×2): 30 [IU] via SUBCUTANEOUS
  Filled 2023-06-20 (×3): qty 0.3

## 2023-06-20 NOTE — Inpatient Diabetes Management (Signed)
Inpatient Diabetes Program Recommendations  AACE/ADA: New Consensus Statement on Inpatient Glycemic Control (2015)  Target Ranges:  Prepandial:   less than 140 mg/dL      Peak postprandial:   less than 180 mg/dL (1-2 hours)      Critically ill patients:  140 - 180 mg/dL   Lab Results  Component Value Date   GLUCAP 373 (H) 06/20/2023   HGBA1C >15.5 (H) 06/18/2023    Review of Glycemic Control  Diabetes history: New Diabetes Diagnosis  Current orders for Inpatient glycemic control:  Semglee 30 units bid Novolog 0-15 units tid + hs Novolog 5 units tid meal coverage  A1c >15.5  Inpatient Diabetes Program Recommendations:    Saw pt and husband again at bedside. They did not have questions at this time regarding DM education. Pt was reading the Living Well with Diabetes Educational booklet. Pt mentioned she gave herself an insulin injection and it was not that bad. Will continue to follow trends and support the education for pt for discharge.   Thanks, Christena Deem RN, MSN, BC-ADM Inpatient Diabetes Coordinator Team Pager [phone] (8a-5p)

## 2023-06-20 NOTE — Progress Notes (Signed)
PROGRESS NOTE    Marissa Weber  WRU:[id number] DOB: 1961-07-20 DOA: 06/18/2023 PCP: Orpah Cobb, MD   Brief Narrative:  HPI: Marissa Weber is a 62 y.o. female with medical history significant for atrial fibrillation not on anticoagulation, upper GI bleed, alcoholic cirrhosis, hypertension, asthma and ongoing tobacco abuse admitted to the hospital with hyperosmolar hyperglycemic state.  Patient states that her PCP had previously told her that she was prediabetic, but she was unable to tolerate the oral medication she was prescribed.  She does not check her blood sugars, and eats a regular diet.  For the last week or so, she has had a cough productive of clear sputum.  Denies any chest pain, fevers, nausea, vomiting, or sick contacts.  Denies any significant wheezing, orthopnea, or dyspnea with exertion.  She came to [facility] today for evaluation of her cough.   ED Course: She was found to be saturating well on room air, and vital signs were unremarkable.  Lab work revealed normal CBC but blood sugar of 1072, sodium 118, and normal renal function.  She was given IV fluids, started on IV insulin drip and patient was accepted for observation admission to stepdown unit at [facility].  Currently the patient is resting comfortably, and has no complaints.  Assessment & Plan:   Principal Problem:   Diabetic hyperosmolar non-ketotic state (HCC)  Hyperosmolar nonketotic state: Came in with blood sugar of more than thousand.  Per patient, she was seen by PCP or some kind of doctors about 6 months ago and she was told that she has prediabetes or early diabetes.  She does not recall her hemoglobin A1c.  Was started on fluids and insulin drip, blood sugar improved, insulin drip and dextrose stopped. Patient drinks almost 6 bottles of soda/16 ounces and drinks almost 5 L of water every day.  I have educated her about the diabetes, diet.  She was seen by diabetes educator, education provided but I think she  probably will benefit from more education.  She remains significantly hyperglycemic yesterday with blood sugar jumping up to 500.  Semglee increased to 30 units for night, I have added 30 units for morning as well.  Continue SSI.  Her hemoglobin A1c is still pending despite of multiple phone calls to the laboratory.  It is very important for [place] to know the results of the hemoglobin A1c to better understand how severe her diabetes is and how to manage insulin.  Acute hyponatremia: Secondary to hyperglycemia, initially was 117 which improved to 127 and has remained stable around that but is still low.  Labs arguing against SIADH.  I will provide her with gentle IV hydration for 1 L and see if that helps.  Essential hypertension: Continue losartan.  Blood pressure controlled.  URI: COVID, flu and RSV negative.  She has no symptoms.  No wheezes on examination.  Chest x-ray negative.  Will discontinue steroids.  Continue DuoNebs.  History of atrial fibrillation: In normal sinus rhythm.  Patient was on anticoagulant in the past but she is not on any currently and she is not sure why.  Per history, she has history of upper GI bleed in 2022.  DVT prophylaxis: enoxaparin (LOVENOX) injection 40 mg Start: 06/18/23 2200 SCDs Start: 06/18/23 1608   Code Status: Full Code  Family Communication: Husband present at bedside.  Plan of care discussed with patient in length and he/she verbalized understanding and agreed with it.  Status is: Observation The patient will require care spanning >  2 midnights and should be moved to inpatient because: Needs better control of blood sugar and needs education by diabetes coordinator.   Estimated body mass index is 31.78 kg/m as calculated from the following:   Height as of this encounter: 4' 11.5" (1.511 m).   Weight as of this encounter: 72.6 kg.    Nutritional Assessment: Body mass index is 31.78 kg/m.Marland Kitchen Seen by dietician.  I agree with the assessment and plan as  outlined below: Nutrition Status:        . Skin Assessment: I have examined the patient's skin and I agree with the wound assessment as performed by the wound care RN as outlined below:    Consultants:  None  Procedures:  None  Antimicrobials:  Anti-infectives (From admission, onward)    None         Subjective: Patient seen and examined.  No complaints.  Husband at the bedside.  Objective: Vitals:   06/20/23 0800 06/20/23 0832 06/20/23 0838 06/20/23 0900  BP:   126/71 126/84  Pulse: 75 87 71 79  Resp: 14 (!) 21 18 (!) 22  Temp:      TempSrc:      SpO2: 100% 100% 100% 99%  Weight:      Height:        Intake/Output Summary (Last 24 hours) at 06/20/2023 1115 Last data filed at 06/20/2023 0755 Gross per 24 hour  Intake --  Output 850 ml  Net -850 ml   Filed Weights   06/18/23 0802  Weight: 72.6 kg    Examination:  General exam: Appears calm and comfortable  Respiratory system: Clear to auscultation. Respiratory effort normal. Cardiovascular system: S1 & S2 heard, RRR. No JVD, murmurs, rubs, gallops or clicks. No pedal edema. Gastrointestinal system: Abdomen is nondistended, soft and nontender. No organomegaly or masses felt. Normal bowel sounds heard. Central nervous system: Alert and oriented. No focal neurological deficits. Extremities: Symmetric 5 x 5 power. Skin: No rashes, lesions or ulcers.  Psychiatry: Judgement and insight appear normal. Mood & affect appropriate.   Data Reviewed: I have personally reviewed following labs and imaging studies  CBC: Recent Labs  Lab 06/18/23 0901 06/18/23 1030  WBC 6.9  --   NEUTROABS 4.0  --   HGB 12.2 14.3  HCT 37.4 42.0  MCV 73.5*  --   PLT 215  --    Basic Metabolic Panel: Recent Labs  Lab 06/18/23 0901 06/18/23 1030 06/18/23 1603 06/19/23 1317 06/19/23 1557 06/20/23 0249  NA 117* 118*  119* 129* 125*  --  127*  K 4.3 3.7  3.8 3.7 3.8  --  3.6  CL 79* 81* 91* 91*  --  91*  CO2 24 23  25 22   --  27  GLUCOSE 1,072* 945* 361* 604* 507* 340*  BUN 7* 9 12 17   --  19  CREATININE 0.92 0.88 0.96 0.87  --  0.83  CALCIUM 10.7* 10.2 10.5* 9.5  --  9.8   GFR: Estimated Creatinine Clearance: 61.8 mL/min (by C-G formula based on SCr of 0.83 mg/dL). Liver Function Tests: Recent Labs  Lab 06/18/23 1030  AST 14*  ALT 8  ALKPHOS 177*  BILITOT 0.6  PROT 7.6  ALBUMIN 3.7   No results for input(s): "LIPASE", "AMYLASE" in the last 168 hours. No results for input(s): "AMMONIA" in the last 168 hours. Coagulation Profile: No results for input(s): "INR", "PROTIME" in the last 168 hours. Cardiac Enzymes: No results for input(s): "CKTOTAL", "CKMB", "CKMBINDEX", "  TROPONINI" in the last 168 hours. BNP (last 3 results) No results for input(s): "PROBNP" in the last 8760 hours. HbA1C: No results for input(s): "HGBA1C" in the last 72 hours. CBG: Recent Labs  Lab 06/19/23 1634 06/19/23 1748 06/19/23 1828 06/19/23 2104 06/20/23 0734  GLUCAP 514* 522* 452* 401* 375*   Lipid Profile: No results for input(s): "CHOL", "HDL", "LDLCALC", "TRIG", "CHOLHDL", "LDLDIRECT" in the last 72 hours. Thyroid Function Tests: No results for input(s): "TSH", "T4TOTAL", "FREET4", "T3FREE", "THYROIDAB" in the last 72 hours. Anemia Panel: No results for input(s): "VITAMINB12", "FOLATE", "FERRITIN", "TIBC", "IRON", "RETICCTPCT" in the last 72 hours. Sepsis Labs: No results for input(s): "PROCALCITON", "LATICACIDVEN" in the last 168 hours.  Recent Results (from the past 240 hour(s))  Resp panel by RT-PCR (RSV, Flu A&B, Covid) Anterior Nasal Swab     Status: None   Collection Time: 06/18/23  9:01 AM   Specimen: Anterior Nasal Swab  Result Value Ref Range Status   SARS Coronavirus 2 by RT PCR NEGATIVE NEGATIVE Final    Comment: (NOTE) SARS-CoV-2 target nucleic acids are NOT DETECTED.  The SARS-CoV-2 RNA is generally detectable in upper respiratory specimens during the acute phase of infection. The  lowest concentration of SARS-CoV-2 viral copies this assay can detect is 138 copies/mL. A negative result does not preclude SARS-Cov-2 infection and should not be used as the sole basis for treatment or other patient management decisions. A negative result may occur with  improper specimen collection/handling, submission of specimen other than nasopharyngeal swab, presence of viral mutation(s) within the areas targeted by this assay, and inadequate number of viral copies(<138 copies/mL). A negative result must be combined with clinical observations, patient history, and epidemiological information. The expected result is Negative.  Fact Sheet for Patients:  BloggerCourse.com  Fact Sheet for Healthcare Providers:  SeriousBroker.it  This test is no t yet approved or cleared by the [place] FDA and  has been authorized for detection and/or diagnosis of SARS-CoV-2 by FDA under an Emergency Use Authorization (EUA). This EUA will remain  in effect (meaning this test can be used) for the duration of the COVID-19 declaration under Section 564(b)(1) of the Act, 21 U.S.C.section 360bbb-3(b)(1), unless the authorization is terminated  or revoked sooner.       Influenza A by PCR NEGATIVE NEGATIVE Final   Influenza B by PCR NEGATIVE NEGATIVE Final    Comment: (NOTE) The Xpert Xpress SARS-CoV-2/FLU/RSV plus assay is intended as an aid in the diagnosis of influenza from Nasopharyngeal swab specimens and should not be used as a sole basis for treatment. Nasal washings and aspirates are unacceptable for Xpert Xpress SARS-CoV-2/FLU/RSV testing.  Fact Sheet for Patients: BloggerCourse.com  Fact Sheet for Healthcare Providers: SeriousBroker.it  This test is not yet approved or cleared by the [place] FDA and has been authorized for detection and/or diagnosis of SARS-CoV-2 by FDA under  an Emergency Use Authorization (EUA). This EUA will remain in effect (meaning this test can be used) for the duration of the COVID-19 declaration under Section 564(b)(1) of the Act, 21 U.S.C. section 360bbb-3(b)(1), unless the authorization is terminated or revoked.     Resp Syncytial Virus by PCR NEGATIVE NEGATIVE Final    Comment: (NOTE) Fact Sheet for Patients: BloggerCourse.com  Fact Sheet for Healthcare Providers: SeriousBroker.it  This test is not yet approved or cleared by the [place] FDA and has been authorized for detection and/or diagnosis of SARS-CoV-2 by FDA under an Emergency Use Authorization (EUA). This EUA will  remain in effect (meaning this test can be used) for the duration of the COVID-19 declaration under Section 564(b)(1) of the Act, 21 U.S.C. section 360bbb-3(b)(1), unless the authorization is terminated or revoked.  Performed at [facility], [address], [place], [place] [address]   MRSA Next Gen by PCR, Nasal     Status: None   Collection Time: 06/18/23  4:44 PM   Specimen: Nasal Mucosa; Nasal Swab  Result Value Ref Range Status   MRSA by PCR Next Gen NOT DETECTED NOT DETECTED Final    Comment: (NOTE) The GeneXpert MRSA Assay (FDA approved for NASAL specimens only), is one component of a comprehensive MRSA colonization surveillance program. It is not intended to diagnose MRSA infection nor to guide or monitor treatment for MRSA infections. Test performance is not FDA approved in patients less than 78 years old. Performed at [facility], [address] [place], [place] [address]      Radiology Studies: No results found.  Scheduled Meds:  aspirin EC  81 mg Oral Daily   Chlorhexidine Gluconate Cloth  6 each Topical Q0600   enoxaparin (LOVENOX) injection  40 mg Subcutaneous Q24H   insulin aspart  0-15 Units Subcutaneous TID WC   insulin aspart  0-5  Units Subcutaneous QHS   insulin aspart  5 Units Subcutaneous TID WC   insulin glargine-yfgn  30 Units Subcutaneous BID   losartan  50 mg Oral Daily   sodium chloride flush  3 mL Intravenous Q12H   tiZANidine  2 mg Oral QHS   Continuous Infusions:   LOS: 0 days   Hughie Closs, MD [facility]  06/20/2023, 11:15 AM   *Please note that this is a verbal dictation therefore any spelling or grammatical errors are due to the "Dragon Medical One" system interpretation.  Please page via Amion and do not message via secure chat for urgent patient care matters. Secure chat can be used for non urgent patient care matters.  How to contact the [facility] Attending or Consulting provider 7A - 7P or covering provider during after hours 7P -7A, for this patient?  Check the care team in [facility] and look for a) attending/consulting TRH provider listed and b) the [facility] team listed. Page or secure chat 7A-7P. Log into [email or web address] and use [place]'s universal password to access. If you do not have the password, please contact the hospital operator. Locate the [facility] provider you are looking for under [facility] and page to a number that you can be directly reached. If you still have difficulty reaching the provider, please page the [facility] (Director on Call) for the Hospitalists listed on amion for assistance.

## 2023-06-20 NOTE — Plan of Care (Signed)
  Problem: Education: Goal: Knowledge of General Education information will improve Description: Including pain rating scale, medication(s)/side effects and non-pharmacologic comfort measures Outcome: Progressing   Problem: Health Behavior/Discharge Planning: Goal: Ability to manage health-related needs will improve Outcome: Progressing   Problem: Clinical Measurements: Goal: Ability to maintain clinical measurements within normal limits will improve Outcome: Progressing Goal: Will remain free from infection Outcome: Progressing Goal: Diagnostic test results will improve Outcome: Progressing Goal: Respiratory complications will improve Outcome: Progressing Goal: Cardiovascular complication will be avoided Outcome: Progressing   Problem: Activity: Goal: Risk for activity intolerance will decrease Outcome: Progressing   Problem: Nutrition: Goal: Adequate nutrition will be maintained Outcome: Progressing   Problem: Coping: Goal: Level of anxiety will decrease Outcome: Progressing   Problem: Elimination: Goal: Will not experience complications related to bowel motility Outcome: Progressing Goal: Will not experience complications related to urinary retention Outcome: Progressing   Problem: Pain Management: Goal: General experience of comfort will improve Outcome: Progressing   Problem: Safety: Goal: Ability to remain free from injury will improve Outcome: Progressing   Problem: Skin Integrity: Goal: Risk for impaired skin integrity will decrease Outcome: Progressing   Problem: Education: Goal: Ability to describe self-care measures that may prevent or decrease complications (Diabetes Survival Skills Education) will improve Outcome: Progressing Goal: Individualized Educational Video(s) Outcome: Progressing   Problem: Coping: Goal: Ability to adjust to condition or change in health will improve Outcome: Progressing   Problem: Fluid Volume: Goal: Ability to  maintain a balanced intake and output will improve Outcome: Progressing   Problem: Health Behavior/Discharge Planning: Goal: Ability to identify and utilize available resources and services will improve Outcome: Progressing Goal: Ability to manage health-related needs will improve Outcome: Progressing   Problem: Metabolic: Goal: Ability to maintain appropriate glucose levels will improve Outcome: Progressing   Problem: Nutritional: Goal: Progress toward achieving an optimal weight will improve Outcome: Progressing   Problem: Skin Integrity: Goal: Risk for impaired skin integrity will decrease Outcome: Progressing   Problem: Tissue Perfusion: Goal: Adequacy of tissue perfusion will improve Outcome: Progressing

## 2023-06-21 ENCOUNTER — Observation Stay (HOSPITAL_COMMUNITY): Payer: Medicaid Other

## 2023-06-21 ENCOUNTER — Other Ambulatory Visit (HOSPITAL_COMMUNITY): Payer: Self-pay

## 2023-06-21 DIAGNOSIS — Z7982 Long term (current) use of aspirin: Secondary | ICD-10-CM | POA: Diagnosis not present

## 2023-06-21 DIAGNOSIS — Z8249 Family history of ischemic heart disease and other diseases of the circulatory system: Secondary | ICD-10-CM | POA: Diagnosis not present

## 2023-06-21 DIAGNOSIS — I10 Essential (primary) hypertension: Secondary | ICD-10-CM | POA: Diagnosis present

## 2023-06-21 DIAGNOSIS — M7989 Other specified soft tissue disorders: Secondary | ICD-10-CM

## 2023-06-21 DIAGNOSIS — E861 Hypovolemia: Secondary | ICD-10-CM | POA: Diagnosis present

## 2023-06-21 DIAGNOSIS — E871 Hypo-osmolality and hyponatremia: Secondary | ICD-10-CM | POA: Diagnosis present

## 2023-06-21 DIAGNOSIS — F17210 Nicotine dependence, cigarettes, uncomplicated: Secondary | ICD-10-CM | POA: Diagnosis present

## 2023-06-21 DIAGNOSIS — E1100 Type 2 diabetes mellitus with hyperosmolarity without nonketotic hyperglycemic-hyperosmolar coma (NKHHC): Secondary | ICD-10-CM | POA: Diagnosis present

## 2023-06-21 DIAGNOSIS — Z981 Arthrodesis status: Secondary | ICD-10-CM | POA: Diagnosis not present

## 2023-06-21 DIAGNOSIS — J069 Acute upper respiratory infection, unspecified: Secondary | ICD-10-CM | POA: Diagnosis present

## 2023-06-21 DIAGNOSIS — Z91030 Bee allergy status: Secondary | ICD-10-CM | POA: Diagnosis not present

## 2023-06-21 DIAGNOSIS — Z9049 Acquired absence of other specified parts of digestive tract: Secondary | ICD-10-CM | POA: Diagnosis not present

## 2023-06-21 DIAGNOSIS — I4891 Unspecified atrial fibrillation: Secondary | ICD-10-CM | POA: Diagnosis present

## 2023-06-21 DIAGNOSIS — R350 Frequency of micturition: Secondary | ICD-10-CM | POA: Diagnosis present

## 2023-06-21 DIAGNOSIS — Z888 Allergy status to other drugs, medicaments and biological substances status: Secondary | ICD-10-CM | POA: Diagnosis not present

## 2023-06-21 DIAGNOSIS — Z96643 Presence of artificial hip joint, bilateral: Secondary | ICD-10-CM | POA: Diagnosis present

## 2023-06-21 DIAGNOSIS — Z833 Family history of diabetes mellitus: Secondary | ICD-10-CM | POA: Diagnosis not present

## 2023-06-21 DIAGNOSIS — Z79899 Other long term (current) drug therapy: Secondary | ICD-10-CM | POA: Diagnosis not present

## 2023-06-21 DIAGNOSIS — Z1152 Encounter for screening for COVID-19: Secondary | ICD-10-CM | POA: Diagnosis not present

## 2023-06-21 LAB — GLUCOSE, CAPILLARY
Glucose-Capillary: 369 mg/dL — ABNORMAL HIGH (ref 70–99)
Glucose-Capillary: 353 mg/dL — ABNORMAL HIGH (ref 70–99)

## 2023-06-21 LAB — BASIC METABOLIC PANEL
Anion gap: 8 (ref 5–15)
BUN: 17 mg/dL (ref 8–23)
CO2: 25 mmol/L (ref 22–32)
Calcium: 9 mg/dL (ref 8.9–10.3)
Chloride: 99 mmol/L (ref 98–111)
Creatinine, Ser: 0.9 mg/dL (ref 0.44–1.00)
GFR, Estimated: >60 mL/min (ref >60)
Glucose, Bld: 310 mg/dL — ABNORMAL HIGH (ref 70–99)
Potassium: 4 mmol/L (ref 3.5–5.1)
Sodium: 132 mmol/L — ABNORMAL LOW (ref 135–145)

## 2023-06-21 MED ORDER — PEN NEEDLES 31G X 5 MM MISC
1.0000 | Freq: Three times a day (TID) | 0 refills | Status: AC
  Filled 2023-06-21: qty 100, 34d supply, fill #0

## 2023-06-21 MED ORDER — LANCET DEVICE MISC
1.0000 | Freq: Three times a day (TID) | 0 refills | Status: AC
  Filled 2023-06-21: qty 1, fill #0

## 2023-06-21 MED ORDER — BLOOD GLUCOSE MONITOR SYSTEM W/DEVICE KIT
1.0000 | PACK | Freq: Three times a day (TID) | 0 refills | Status: AC
  Filled 2023-06-21: qty 1, 30d supply, fill #0

## 2023-06-21 MED ORDER — INSULIN GLARGINE-YFGN 100 UNIT/ML ~~LOC~~ SOLN
40.0000 [IU] | Freq: Two times a day (BID) | SUBCUTANEOUS | Status: DC
  Administered 2023-06-21: 40 [IU] via SUBCUTANEOUS
  Filled 2023-06-21 (×2): qty 0.4

## 2023-06-21 MED ORDER — INSULIN GLARGINE 100 UNIT/ML SOLOSTAR PEN
40.0000 [IU] | PEN_INJECTOR | Freq: Two times a day (BID) | SUBCUTANEOUS | 0 refills | Status: AC
  Filled 2023-06-21: qty 15, 19d supply, fill #0

## 2023-06-21 MED ORDER — ACCU-CHEK SOFTCLIX LANCETS MISC
1.0000 | Freq: Three times a day (TID) | 0 refills | Status: AC
  Filled 2023-06-21: qty 100, 34d supply, fill #0

## 2023-06-21 MED ORDER — INSULIN ASPART 100 UNIT/ML FLEXPEN
5.0000 [IU] | PEN_INJECTOR | Freq: Three times a day (TID) | SUBCUTANEOUS | 0 refills | Status: AC
  Filled 2023-06-21: qty 15, 83d supply, fill #0

## 2023-06-21 MED ORDER — BLOOD GLUCOSE TEST VI STRP
1.0000 | ORAL_STRIP | Freq: Three times a day (TID) | 0 refills | Status: AC
  Filled 2023-06-21: qty 100, 34d supply, fill #0

## 2023-06-21 MED ORDER — SODIUM CHLORIDE 0.9% FLUSH: 3.0000 mL | Freq: Two times a day (BID) | INTRAVENOUS | Status: DC

## 2023-06-21 NOTE — Plan of Care (Signed)
  Problem: Education: Goal: Knowledge of General Education information will improve Description: Including pain rating scale, medication(s)/side effects and non-pharmacologic comfort measures Outcome: Progressing   Problem: Health Behavior/Discharge Planning: Goal: Ability to manage health-related needs will improve Outcome: Progressing   Problem: Clinical Measurements: Goal: Ability to maintain clinical measurements within normal limits will improve Outcome: Progressing Goal: Will remain free from infection Outcome: Progressing Goal: Diagnostic test results will improve Outcome: Progressing Goal: Respiratory complications will improve Outcome: Progressing Goal: Cardiovascular complication will be avoided Outcome: Progressing   Problem: Activity: Goal: Risk for activity intolerance will decrease Outcome: Progressing   Problem: Nutrition: Goal: Adequate nutrition will be maintained Outcome: Progressing   Problem: Coping: Goal: Level of anxiety will decrease Outcome: Progressing   Problem: Elimination: Goal: Will not experience complications related to bowel motility Outcome: Progressing Goal: Will not experience complications related to urinary retention Outcome: Progressing   Problem: Pain Management: Goal: General experience of comfort will improve Outcome: Progressing   Problem: Safety: Goal: Ability to remain free from injury will improve Outcome: Progressing   Problem: Skin Integrity: Goal: Risk for impaired skin integrity will decrease Outcome: Progressing   Problem: Education: Goal: Ability to describe self-care measures that may prevent or decrease complications (Diabetes Survival Skills Education) will improve Outcome: Progressing Goal: Individualized Educational Video(s) Outcome: Progressing   Problem: Coping: Goal: Ability to adjust to condition or change in health will improve Outcome: Progressing   Problem: Fluid Volume: Goal: Ability to  maintain a balanced intake and output will improve Outcome: Progressing   Problem: Health Behavior/Discharge Planning: Goal: Ability to identify and utilize available resources and services will improve Outcome: Progressing Goal: Ability to manage health-related needs will improve Outcome: Progressing   Problem: Metabolic: Goal: Ability to maintain appropriate glucose levels will improve Outcome: Progressing   Problem: Nutritional: Goal: Progress toward achieving an optimal weight will improve Outcome: Progressing   Problem: Skin Integrity: Goal: Risk for impaired skin integrity will decrease Outcome: Progressing   Problem: Tissue Perfusion: Goal: Adequacy of tissue perfusion will improve Outcome: Progressing

## 2023-06-21 NOTE — Progress Notes (Signed)
Patient discharge instructions reviewed, patient did not have any questions/concerns at this time. Patient IV removed without complications, all personal items accounted for, patient taken to privately owned vehicle via wheelchair by student, Dahlia Client, Environmental health practitioner.

## 2023-06-21 NOTE — Care Management (Signed)
Transition of Care [facility]) - Inpatient Brief Assessment   Patient Details  Name: Marissa Weber MRN: [id number] Date of Birth: 03-11-1961  [facility] CM/SW Contact:    Lavenia Atlas, RN Phone Number: 06/21/2023, 1:23 PM   Clinical Narrative: Per chart review patient on [facility] SDU for Diabetic hyperosmolar non-ketotic state.   No current TOC needs.    Transition of Care Asessment: Insurance and Status: Insurance coverage has been reviewed Patient has primary care physician: Yes Home environment has been reviewed: from home with children Prior level of function:: independent Prior/Current Home Services: No current home services Social Determinants of Health Reivew: SDOH reviewed no interventions necessary Readmission risk has been reviewed: Yes Transition of care needs: no transition of care needs at this time

## 2023-06-21 NOTE — Discharge Summary (Signed)
Physician Discharge Summary  Marissa Weber GMW:[id number] DOB: [birth year] DOA: 06/18/2023  PCP: Orpah Cobb, MD  Admit date: 06/18/2023 Discharge date: 06/21/2023    Admitted From: Home Disposition: Home  Recommendations for Outpatient Follow-up:  Follow up with PCP in 1-2 weeks Please obtain BMP/CBC in one week Please follow up with your PCP on the following pending results: Unresulted Labs (From admission, onward)    None         Home Health: None Equipment/Devices: None  Discharge Condition: Stable CODE STATUS: Full code Diet recommendation: Diabetic  Subjective: Seen and examined.  Husband at the bedside.  Discussed her hemoglobin A1c.  She has injected insulin to herself and feels comfortable.  She feels ready and comfortable going home.  Has been on the same page as well.  Brief/Interim Summary:  Marissa Weber is a 62 y.o. female with medical history significant for atrial fibrillation not on anticoagulation, upper GI bleed, alcoholic cirrhosis, hypertension, asthma and ongoing tobacco abuse admitted to the hospital with hyperosmolar hyperglycemic state.  Patient states that her PCP had previously told her that she was prediabetic, but she was unable to tolerate the oral medication she was prescribed.  She does not check her blood sugars, and eats a regular diet.  For the last week or so, she has had a cough productive of clear sputum.  Denied any chest pain, fevers, nausea, vomiting, or sick contacts.  Upon arrival to ED, she was found to be saturating well on room air, and vital signs were unremarkable.  Lab work revealed normal CBC but blood sugar of 1072, sodium 118, and normal renal function.  She was diagnosed with hyperosmolar nonketotic state and she was given IV fluids, started on IV insulin drip and patient was accepted for observation admission to stepdown unit at [facility]. blood sugar improved, insulin drip and dextrose stopped. Patient revealed that she drinks  almost 6 bottles of soda/16 ounces and drinks almost 5 L of water every day.  I have educated her about the diabetes, and diet.  Her hemoglobin A1c turned out to be> 15.5.  She was started on long-acting insulin at 10 units nightly but she remained hyperglycemic with blood sugar reaching 500, this was gradually escalated and currently she is on 40 units of Semglee twice daily along with 5 units of NovoLog 3 times daily Premeal and SSI and now her blood sugar seems to have controlled.  She was seen by diabetes educator and patient feels comfortable with education provided.  She is now stable and is being discharged home.   Acute hyponatremia: Secondary to hyperglycemia and hypovolemia due to increased frequency of urination, initially was 117 which improved to 127 and has remained stable around that for 2 days.  Labs arguing against SIADH.  I gave her 1 L of IV fluid IV yesterday and her sodium is now improved to 132.   Essential hypertension: Continue losartan.  Blood pressure controlled.   URI: COVID, flu and RSV negative.  She has no symptoms.  No wheezes on examination.  Chest x-ray negative.    History of atrial fibrillation: In normal sinus rhythm.  Patient was on anticoagulant in the past but she is not on any currently and she is not sure why.  Per history, she has history of upper GI bleed in 2022.  History of "knot" in right distal anemias muscle: Patient complains of some lower extremity pain which was chronic.  Out of concern of DVT, Doppler was done  of the lower extremities and DVT ruled out.  Discharge plan was discussed with patient and/or family member and they verbalized understanding and agreed with it.  Discharge Diagnoses:  Principal Problem:   Diabetic hyperosmolar non-ketotic state (HCC) Active Problems:   Uncontrolled type 2 diabetes mellitus with hyperosmolar nonketotic hyperglycemia (HCC)    Discharge Instructions   Allergies as of 06/21/2023       Reactions   Bee  Venom Swelling, Other (See Comments)   Swelling wherever the sting is. Also gets dizzy   Lisinopril Swelling        Medication List     STOP taking these medications    predniSONE 20 MG tablet Commonly known as: DELTASONE   promethazine-dextromethorphan 6.25-15 MG/5ML syrup Commonly known as: PROMETHAZINE-DM       TAKE these medications    albuterol 108 (90 Base) MCG/ACT inhaler Commonly known as: VENTOLIN HFA Inhale 2 puffs into the lungs every 6 (six) hours as needed for wheezing or shortness of breath.   aspirin EC 81 MG tablet Take 81 mg by mouth daily.   Blood Glucose Monitoring Suppl Devi 1 each by Does not apply route 3 (three) times daily. May dispense any manufacturer covered by patient's insurance.   BLOOD GLUCOSE TEST STRIPS Strp Use to cheeck blood sugar 3 times a day   insulin aspart 100 UNIT/ML FlexPen Commonly known as: NOVOLOG Inject 5 Units into the skin 3 (three) times daily with meals. If eating and Blood Glucose (BG) 80 or higher inject 0 units for meal coverage and add correction dose per scale. If not eating, correction dose only. BG <150= 0 unit; BG 150-200= 1 unit; BG 201-250= 2 unit; BG 251-300= 3 unit; BG 301-350= 4 unit; BG 351-400= 5 unit; BG >400= 6 unit and Call Primary care.   insulin glargine 100 UNIT/ML Solostar Pen Commonly known as: LANTUS Inject 40 Units into the skin 2 (two) times daily. May substitute as needed per insurance.   Lancet Device Misc 1 each by Does not apply route 3 (three) times daily. May dispense any manufacturer covered by patient's insurance.   Lancets Misc 1 each by Does not apply route 3 (three) times daily. Use as directed to check blood sugar. May dispense any manufacturer covered by patient's insurance and fits patient's device.   losartan 50 MG tablet Commonly known as: COZAAR Take 50 mg by mouth daily.   Pen Needles 31G X 5 MM Misc Use 3 times a day   tiZANidine 2 MG tablet Commonly known as:  ZANAFLEX TAKE 1 TABLET BY MOUTH AT BEDTIME AS NEEDED FOR MUSCLE SPASMS.        Follow-up Information     Orpah Cobb, MD Follow up in 1 week(s).   Specialty: Cardiology Contact information: [address] [place] [address] [phone]                Allergies  Allergen Reactions   Bee Venom Swelling and Other (See Comments)    Swelling wherever the sting is. Also gets dizzy   Lisinopril Swelling    Consultations: None   Procedures/Studies: DG Chest Port 1 View  Result Date: 06/18/2023 CLINICAL DATA:  Shortness of breath. EXAM: PORTABLE CHEST 1 VIEW COMPARISON:  Chest radiograph dated June 29, 2021. FINDINGS: The heart size and mediastinal contours are within normal limits. No focal consolidation, pneumothorax, or pleural effusion. No acute osseous abnormality. IMPRESSION: No acute cardiopulmonary findings. Electronically Signed   By: Maryan Char.D.  On: 06/18/2023 11:20     Discharge Exam: Vitals:   06/21/23 0800 06/21/23 0900  BP: 125/85   Pulse: 76   Resp: 18   Temp:  (!) 97.2 F (36.2 C)  SpO2: 97%    Vitals:   06/21/23 0500 06/21/23 0600 06/21/23 0800 06/21/23 0900  BP: (!) 85/51 101/69 125/85   Pulse: 95 70 76   Resp: 20 17 18    Temp:    (!) 97.2 F (36.2 C)  TempSrc:    Oral  SpO2: 93% 96% 97%   Weight:      Height:        General: Pt is alert, awake, not in acute distress Cardiovascular: RRR, S1/S2 +, no rubs, no gallops Respiratory: CTA bilaterally, no wheezing, no rhonchi Abdominal: Soft, NT, ND, bowel sounds + Extremities: no edema, no cyanosis    The results of significant diagnostics from this hospitalization (including imaging, microbiology, ancillary and laboratory) are listed below for reference.     Microbiology: Recent Results (from the past 240 hour(s))  Resp panel by RT-PCR (RSV, Flu A&B, Covid) Anterior Nasal Swab     Status: None   Collection Time: 06/18/23  9:01 AM   Specimen: Anterior Nasal  Swab  Result Value Ref Range Status   SARS Coronavirus 2 by RT PCR NEGATIVE NEGATIVE Final    Comment: (NOTE) SARS-CoV-2 target nucleic acids are NOT DETECTED.  The SARS-CoV-2 RNA is generally detectable in upper respiratory specimens during the acute phase of infection. The lowest concentration of SARS-CoV-2 viral copies this assay can detect is 138 copies/mL. A negative result does not preclude SARS-Cov-2 infection and should not be used as the sole basis for treatment or other patient management decisions. A negative result may occur with  improper specimen collection/handling, submission of specimen other than nasopharyngeal swab, presence of viral mutation(s) within the areas targeted by this assay, and inadequate number of viral copies(<138 copies/mL). A negative result must be combined with clinical observations, patient history, and epidemiological information. The expected result is Negative.  Fact Sheet for Patients:  BloggerCourse.com  Fact Sheet for Healthcare Providers:  SeriousBroker.it  This test is no t yet approved or cleared by the [place] FDA and  has been authorized for detection and/or diagnosis of SARS-CoV-2 by FDA under an Emergency Use Authorization (EUA). This EUA will remain  in effect (meaning this test can be used) for the duration of the COVID-19 declaration under Section 564(b)(1) of the Act, 21 U.S.C.section 360bbb-3(b)(1), unless the authorization is terminated  or revoked sooner.       Influenza A by PCR NEGATIVE NEGATIVE Final   Influenza B by PCR NEGATIVE NEGATIVE Final    Comment: (NOTE) The Xpert Xpress SARS-CoV-2/FLU/RSV plus assay is intended as an aid in the diagnosis of influenza from Nasopharyngeal swab specimens and should not be used as a sole basis for treatment. Nasal washings and aspirates are unacceptable for Xpert Xpress SARS-CoV-2/FLU/RSV testing.  Fact Sheet for  Patients: BloggerCourse.com  Fact Sheet for Healthcare Providers: SeriousBroker.it  This test is not yet approved or cleared by the [place] FDA and has been authorized for detection and/or diagnosis of SARS-CoV-2 by FDA under an Emergency Use Authorization (EUA). This EUA will remain in effect (meaning this test can be used) for the duration of the COVID-19 declaration under Section 564(b)(1) of the Act, 21 U.S.C. section 360bbb-3(b)(1), unless the authorization is terminated or revoked.     Resp Syncytial Virus by PCR NEGATIVE NEGATIVE Final  Comment: (NOTE) Fact Sheet for Patients: BloggerCourse.com  Fact Sheet for Healthcare Providers: SeriousBroker.it  This test is not yet approved or cleared by the [place] FDA and has been authorized for detection and/or diagnosis of SARS-CoV-2 by FDA under an Emergency Use Authorization (EUA). This EUA will remain in effect (meaning this test can be used) for the duration of the COVID-19 declaration under Section 564(b)(1) of the Act, 21 U.S.C. section 360bbb-3(b)(1), unless the authorization is terminated or revoked.  Performed at [facility], [address], [place], [place] [address]   MRSA Next Gen by PCR, Nasal     Status: None   Collection Time: 06/18/23  4:44 PM   Specimen: Nasal Mucosa; Nasal Swab  Result Value Ref Range Status   MRSA by PCR Next Gen NOT DETECTED NOT DETECTED Final    Comment: (NOTE) The GeneXpert MRSA Assay (FDA approved for NASAL specimens only), is one component of a comprehensive MRSA colonization surveillance program. It is not intended to diagnose MRSA infection nor to guide or monitor treatment for MRSA infections. Test performance is not FDA approved in patients less than 31 years old. Performed at [facility], [address], [place], [place]  [address]      Labs: BNP (last 3 results) No results for input(s): "BNP" in the last 8760 hours. Basic Metabolic Panel: Recent Labs  Lab 06/18/23 1030 06/18/23 1603 06/19/23 1317 06/19/23 1557 06/20/23 0249 06/20/23 1556 06/21/23 0246  NA 118*  119* 129* 125*  --  127* 131* 132*  K 3.7  3.8 3.7 3.8  --  3.6  --  4.0  CL 81* 91* 91*  --  91*  --  99  CO2 23 25 22   --  27  --  25  GLUCOSE 945* 361* 604* 507* 340*  --  310*  BUN 9 12 17   --  19  --  17  CREATININE 0.88 0.96 0.87  --  0.83  --  0.90  CALCIUM 10.2 10.5* 9.5  --  9.8  --  9.0   Liver Function Tests: Recent Labs  Lab 06/18/23 1030  AST 14*  ALT 8  ALKPHOS 177*  BILITOT 0.6  PROT 7.6  ALBUMIN 3.7   No results for input(s): "LIPASE", "AMYLASE" in the last 168 hours. No results for input(s): "AMMONIA" in the last 168 hours. CBC: Recent Labs  Lab 06/18/23 0901 06/18/23 1030  WBC 6.9  --   NEUTROABS 4.0  --   HGB 12.2 14.3  HCT 37.4 42.0  MCV 73.5*  --   PLT 215  --    Cardiac Enzymes: No results for input(s): "CKTOTAL", "CKMB", "CKMBINDEX", "TROPONINI" in the last 168 hours. BNP: Invalid input(s): "POCBNP" CBG: Recent Labs  Lab 06/20/23 0734 06/20/23 1159 06/20/23 1654 06/20/23 2211 06/21/23 0843  GLUCAP 375* 373* 157* 263* 369*   D-Dimer No results for input(s): "DDIMER" in the last 72 hours. Hgb A1c Recent Labs    06/18/23 1608  HGBA1C >15.5*   Lipid Profile No results for input(s): "CHOL", "HDL", "LDLCALC", "TRIG", "CHOLHDL", "LDLDIRECT" in the last 72 hours. Thyroid function studies No results for input(s): "TSH", "T4TOTAL", "T3FREE", "THYROIDAB" in the last 72 hours.  Invalid input(s): "FREET3" Anemia work up No results for input(s): "VITAMINB12", "FOLATE", "FERRITIN", "TIBC", "IRON", "RETICCTPCT" in the last 72 hours. Urinalysis    Component Value Date/Time   COLORURINE COLORLESS (A) 06/18/2023 1013   APPEARANCEUR CLEAR 06/18/2023 1013   LABSPEC 1.027 06/18/2023 1013  PHURINE 6.5 06/18/2023 1013   GLUCOSEU >1,000 (A) 06/18/2023 1013   HGBUR NEGATIVE 06/18/2023 1013   BILIRUBINUR NEGATIVE 06/18/2023 1013   KETONESUR 15 (A) 06/18/2023 1013   PROTEINUR NEGATIVE 06/18/2023 1013   UROBILINOGEN 1.0 10/15/2011 1410   NITRITE NEGATIVE 06/18/2023 1013   LEUKOCYTESUR NEGATIVE 06/18/2023 1013   Sepsis Labs Recent Labs  Lab 06/18/23 0901  WBC 6.9   Microbiology Recent Results (from the past 240 hour(s))  Resp panel by RT-PCR (RSV, Flu A&B, Covid) Anterior Nasal Swab     Status: None   Collection Time: 06/18/23  9:01 AM   Specimen: Anterior Nasal Swab  Result Value Ref Range Status   SARS Coronavirus 2 by RT PCR NEGATIVE NEGATIVE Final    Comment: (NOTE) SARS-CoV-2 target nucleic acids are NOT DETECTED.  The SARS-CoV-2 RNA is generally detectable in upper respiratory specimens during the acute phase of infection. The lowest concentration of SARS-CoV-2 viral copies this assay can detect is 138 copies/mL. A negative result does not preclude SARS-Cov-2 infection and should not be used as the sole basis for treatment or other patient management decisions. A negative result may occur with  improper specimen collection/handling, submission of specimen other than nasopharyngeal swab, presence of viral mutation(s) within the areas targeted by this assay, and inadequate number of viral copies(<138 copies/mL). A negative result must be combined with clinical observations, patient history, and epidemiological information. The expected result is Negative.  Fact Sheet for Patients:  BloggerCourse.com  Fact Sheet for Healthcare Providers:  SeriousBroker.it  This test is no t yet approved or cleared by the [place] FDA and  has been authorized for detection and/or diagnosis of SARS-CoV-2 by FDA under an Emergency Use Authorization (EUA). This EUA will remain  in effect (meaning this test can be used) for  the duration of the COVID-19 declaration under Section 564(b)(1) of the Act, 21 U.S.C.section 360bbb-3(b)(1), unless the authorization is terminated  or revoked sooner.       Influenza A by PCR NEGATIVE NEGATIVE Final   Influenza B by PCR NEGATIVE NEGATIVE Final    Comment: (NOTE) The Xpert Xpress SARS-CoV-2/FLU/RSV plus assay is intended as an aid in the diagnosis of influenza from Nasopharyngeal swab specimens and should not be used as a sole basis for treatment. Nasal washings and aspirates are unacceptable for Xpert Xpress SARS-CoV-2/FLU/RSV testing.  Fact Sheet for Patients: BloggerCourse.com  Fact Sheet for Healthcare Providers: SeriousBroker.it  This test is not yet approved or cleared by the [place] FDA and has been authorized for detection and/or diagnosis of SARS-CoV-2 by FDA under an Emergency Use Authorization (EUA). This EUA will remain in effect (meaning this test can be used) for the duration of the COVID-19 declaration under Section 564(b)(1) of the Act, 21 U.S.C. section 360bbb-3(b)(1), unless the authorization is terminated or revoked.     Resp Syncytial Virus by PCR NEGATIVE NEGATIVE Final    Comment: (NOTE) Fact Sheet for Patients: BloggerCourse.com  Fact Sheet for Healthcare Providers: SeriousBroker.it  This test is not yet approved or cleared by the [place] FDA and has been authorized for detection and/or diagnosis of SARS-CoV-2 by FDA under an Emergency Use Authorization (EUA). This EUA will remain in effect (meaning this test can be used) for the duration of the COVID-19 declaration under Section 564(b)(1) of the Act, 21 U.S.C. section 360bbb-3(b)(1), unless the authorization is terminated or revoked.  Performed at [facility], [address] [place], [place] [address]   MRSA Next Gen  by PCR, Nasal     Status:  None   Collection Time: 06/18/23  4:44 PM   Specimen: Nasal Mucosa; Nasal Swab  Result Value Ref Range Status   MRSA by PCR Next Gen NOT DETECTED NOT DETECTED Final    Comment: (NOTE) The GeneXpert MRSA Assay (FDA approved for NASAL specimens only), is one component of a comprehensive MRSA colonization surveillance program. It is not intended to diagnose MRSA infection nor to guide or monitor treatment for MRSA infections. Test performance is not FDA approved in patients less than 23 years old. Performed at [facility], [address], [place], [place] [address]     FURTHER DISCHARGE INSTRUCTIONS:   Get Medicines reviewed and adjusted: Please take all your medications with you for your next visit with your Primary MD   Laboratory/radiological data: Please request your Primary MD to go over all hospital tests and procedure/radiological results at the follow up, please ask your Primary MD to get all Hospital records sent to his/her office.   In some cases, they will be blood work, cultures and biopsy results pending at the time of your discharge. Please request that your primary care M.D. goes through all the records of your hospital data and follows up on these results.   Also Note the following: If you experience worsening of your admission symptoms, develop shortness of breath, life threatening emergency, suicidal or homicidal thoughts you must seek medical attention immediately by calling 911 or calling your MD immediately  if symptoms less severe.   You must read complete instructions/literature along with all the possible adverse reactions/side effects for all the Medicines you take and that have been prescribed to you. Take any new Medicines after you have completely understood and accpet all the possible adverse reactions/side effects.    Do not drive when taking Pain medications or sleeping medications (Benzodaizepines)   Do not take more than prescribed  Pain, Sleep and Anxiety Medications. It is not advisable to combine anxiety,sleep and pain medications without talking with your primary care practitioner   Special Instructions: If you have smoked or chewed Tobacco  in the last 2 yrs please stop smoking, stop any regular Alcohol  and or any Recreational drug use.   Wear Seat belts while driving.   Please note: You were cared for by a hospitalist during your hospital stay. Once you are discharged, your primary care physician will handle any further medical issues. Please note that NO REFILLS for any discharge medications will be authorized once you are discharged, as it is imperative that you return to your primary care physician (or establish a relationship with a primary care physician if you do not have one) for your post hospital discharge needs so that they can reassess your need for medications and monitor your lab values  Time coordinating discharge: Over 30 minutes  SIGNED:   Hughie Closs, MD  [facility] 06/21/2023, 11:10 AM *Please note that this is a verbal dictation therefore any spelling or grammatical errors are due to the "Dragon Medical One" system interpretation. If 7PM-7AM, please contact night-coverage [email or web address]

## 2023-06-21 NOTE — Progress Notes (Signed)
Bilateral lower extremity venous duplex has been completed. Preliminary results can be found in CV Proc through chart review.   06/21/23 12:13 PM Olen Cordial RVT

## 2023-06-21 NOTE — Progress Notes (Signed)
OT Cancellation Note  Patient Details Name: Marissa Weber MRN: [id number] DOB: 07/20/1961   Cancelled Treatment:    Reason Eval/Treat Not Completed: OT screened, no needs identified, will sign off Pt independent in room for ADL tasks and functional mobility. Thank you for the referral.  Limmie Patricia, OTR/L,CBIS  Supplemental OT - [facility] and [facility] Secure Chat Preferred   06/21/2023, 2:46 PM

## 2023-06-21 NOTE — Progress Notes (Signed)
PT Cancellation Note  Patient Details Name: Marissa Weber MRN: [id number] DOB: 05/01/1961   Cancelled Treatment:    Reason Eval/Treat Not Completed: PT screened, no needs identified, will sign off  Noted pt has been independent with mobility - spoke with RN who confirms pt moving independently in room without difficulty.  Spoke with pt who reports normal balance, mobility, and strength. Reports independent with ADLs.  She has no concerns about her mobility.  Will sign off.  Anise Salvo, PT Acute Rehab [facility] [phone]  Rayetta Humphrey 06/21/2023, 2:40 PM
